# Patient Record
Sex: Female | Born: 1955 | Race: Black or African American | Hispanic: No | Smoking: Never smoker
Health system: Southern US, Community
[De-identification: ages and names within clinical notes are randomized; demographics above are authoritative.]

## PROBLEM LIST (undated history)

## (undated) DIAGNOSIS — L309 Dermatitis, unspecified: Secondary | ICD-10-CM

## (undated) DIAGNOSIS — C50919 Malignant neoplasm of unspecified site of unspecified female breast: Secondary | ICD-10-CM

## (undated) DIAGNOSIS — T4145XA Adverse effect of unspecified anesthetic, initial encounter: Secondary | ICD-10-CM

## (undated) DIAGNOSIS — D649 Anemia, unspecified: Secondary | ICD-10-CM

## (undated) DIAGNOSIS — N200 Calculus of kidney: Secondary | ICD-10-CM

## (undated) DIAGNOSIS — Z9889 Other specified postprocedural states: Secondary | ICD-10-CM

## (undated) DIAGNOSIS — I4891 Unspecified atrial fibrillation: Secondary | ICD-10-CM

## (undated) DIAGNOSIS — K219 Gastro-esophageal reflux disease without esophagitis: Secondary | ICD-10-CM

## (undated) DIAGNOSIS — I499 Cardiac arrhythmia, unspecified: Secondary | ICD-10-CM

## (undated) DIAGNOSIS — E7800 Pure hypercholesterolemia, unspecified: Secondary | ICD-10-CM

## (undated) DIAGNOSIS — R112 Nausea with vomiting, unspecified: Secondary | ICD-10-CM

## (undated) DIAGNOSIS — Z803 Family history of malignant neoplasm of breast: Secondary | ICD-10-CM

## (undated) DIAGNOSIS — I10 Essential (primary) hypertension: Secondary | ICD-10-CM

## (undated) DIAGNOSIS — I480 Paroxysmal atrial fibrillation: Secondary | ICD-10-CM

## (undated) DIAGNOSIS — C50411 Malignant neoplasm of upper-outer quadrant of right female breast: Principal | ICD-10-CM

## (undated) DIAGNOSIS — Z22322 Carrier or suspected carrier of Methicillin resistant Staphylococcus aureus: Secondary | ICD-10-CM

## (undated) DIAGNOSIS — E559 Vitamin D deficiency, unspecified: Secondary | ICD-10-CM

## (undated) DIAGNOSIS — Z8042 Family history of malignant neoplasm of prostate: Secondary | ICD-10-CM

## (undated) HISTORY — DX: Unspecified atrial fibrillation: I48.91

## (undated) HISTORY — DX: Vitamin D deficiency, unspecified: E55.9

## (undated) HISTORY — DX: Anemia, unspecified: D64.9

## (undated) HISTORY — DX: Malignant neoplasm of unspecified site of unspecified female breast: C50.919

## (undated) HISTORY — PX: BREAST SURGERY: SHX581

## (undated) HISTORY — DX: Malignant neoplasm of upper-outer quadrant of right female breast: C50.411

## (undated) HISTORY — DX: Family history of malignant neoplasm of breast: Z80.3

## (undated) HISTORY — DX: Family history of malignant neoplasm of prostate: Z80.42

---

## 1999-07-23 ENCOUNTER — Emergency Department (HOSPITAL_COMMUNITY): Admission: EM | Admit: 1999-07-23 | Discharge: 1999-07-23 | Payer: Self-pay | Admitting: *Deleted

## 2002-05-29 ENCOUNTER — Encounter: Admission: RE | Admit: 2002-05-29 | Discharge: 2002-05-29 | Payer: Self-pay | Admitting: Internal Medicine

## 2002-08-03 ENCOUNTER — Encounter: Admission: RE | Admit: 2002-08-03 | Discharge: 2002-08-03 | Payer: Self-pay | Admitting: Internal Medicine

## 2002-08-23 ENCOUNTER — Encounter: Admission: RE | Admit: 2002-08-23 | Discharge: 2002-08-23 | Payer: Self-pay | Admitting: Internal Medicine

## 2002-09-25 ENCOUNTER — Other Ambulatory Visit: Admission: RE | Admit: 2002-09-25 | Discharge: 2002-09-25 | Payer: Self-pay | Admitting: *Deleted

## 2002-09-25 ENCOUNTER — Encounter (INDEPENDENT_AMBULATORY_CARE_PROVIDER_SITE_OTHER): Payer: Self-pay | Admitting: *Deleted

## 2002-09-25 ENCOUNTER — Encounter: Admission: RE | Admit: 2002-09-25 | Discharge: 2002-09-25 | Payer: Self-pay | Admitting: *Deleted

## 2002-10-29 ENCOUNTER — Encounter: Admission: RE | Admit: 2002-10-29 | Discharge: 2002-10-29 | Payer: Self-pay | Admitting: Internal Medicine

## 2002-11-29 ENCOUNTER — Other Ambulatory Visit: Admission: RE | Admit: 2002-11-29 | Discharge: 2002-11-29 | Payer: Self-pay | Admitting: Internal Medicine

## 2003-10-17 ENCOUNTER — Other Ambulatory Visit: Admission: RE | Admit: 2003-10-17 | Discharge: 2003-10-17 | Payer: Self-pay | Admitting: Internal Medicine

## 2003-11-05 ENCOUNTER — Ambulatory Visit (HOSPITAL_COMMUNITY): Admission: RE | Admit: 2003-11-05 | Discharge: 2003-11-05 | Payer: Self-pay | Admitting: Internal Medicine

## 2003-11-13 ENCOUNTER — Encounter: Admission: RE | Admit: 2003-11-13 | Discharge: 2003-11-13 | Payer: Self-pay | Admitting: Internal Medicine

## 2005-02-01 ENCOUNTER — Encounter: Admission: RE | Admit: 2005-02-01 | Discharge: 2005-02-01 | Payer: Self-pay | Admitting: Internal Medicine

## 2005-02-16 ENCOUNTER — Encounter: Admission: RE | Admit: 2005-02-16 | Discharge: 2005-02-16 | Payer: Self-pay | Admitting: Internal Medicine

## 2005-04-21 ENCOUNTER — Other Ambulatory Visit: Admission: RE | Admit: 2005-04-21 | Discharge: 2005-04-21 | Payer: Self-pay | Admitting: Internal Medicine

## 2005-09-06 ENCOUNTER — Ambulatory Visit (HOSPITAL_COMMUNITY): Admission: RE | Admit: 2005-09-06 | Discharge: 2005-09-06 | Payer: Self-pay | Admitting: Urology

## 2005-10-07 ENCOUNTER — Ambulatory Visit (HOSPITAL_COMMUNITY): Admission: RE | Admit: 2005-10-07 | Discharge: 2005-10-07 | Payer: Self-pay | Admitting: Urology

## 2005-10-29 ENCOUNTER — Inpatient Hospital Stay (HOSPITAL_COMMUNITY): Admission: RE | Admit: 2005-10-29 | Discharge: 2005-11-01 | Payer: Self-pay | Admitting: Urology

## 2006-01-12 ENCOUNTER — Encounter: Admission: RE | Admit: 2006-01-12 | Discharge: 2006-01-12 | Payer: Self-pay | Admitting: Internal Medicine

## 2006-02-03 ENCOUNTER — Ambulatory Visit (HOSPITAL_COMMUNITY): Admission: RE | Admit: 2006-02-03 | Discharge: 2006-02-03 | Payer: Self-pay | Admitting: Family Medicine

## 2007-02-03 ENCOUNTER — Other Ambulatory Visit: Admission: RE | Admit: 2007-02-03 | Discharge: 2007-02-03 | Payer: Self-pay | Admitting: Internal Medicine

## 2007-02-07 ENCOUNTER — Encounter: Admission: RE | Admit: 2007-02-07 | Discharge: 2007-02-07 | Payer: Self-pay | Admitting: Internal Medicine

## 2007-10-19 DIAGNOSIS — Z22322 Carrier or suspected carrier of Methicillin resistant Staphylococcus aureus: Secondary | ICD-10-CM

## 2007-10-19 HISTORY — DX: Carrier or suspected carrier of methicillin resistant Staphylococcus aureus: Z22.322

## 2007-11-16 ENCOUNTER — Emergency Department (HOSPITAL_COMMUNITY): Admission: EM | Admit: 2007-11-16 | Discharge: 2007-11-16 | Payer: Self-pay | Admitting: Emergency Medicine

## 2007-11-17 ENCOUNTER — Ambulatory Visit: Payer: Self-pay | Admitting: Psychiatry

## 2007-11-17 ENCOUNTER — Inpatient Hospital Stay (HOSPITAL_COMMUNITY): Admission: EM | Admit: 2007-11-17 | Discharge: 2007-11-20 | Payer: Self-pay | Admitting: Psychiatry

## 2008-03-06 ENCOUNTER — Encounter: Admission: RE | Admit: 2008-03-06 | Discharge: 2008-03-06 | Payer: Self-pay | Admitting: Internal Medicine

## 2008-07-24 ENCOUNTER — Encounter: Admission: RE | Admit: 2008-07-24 | Discharge: 2008-07-24 | Payer: Self-pay | Admitting: Occupational Medicine

## 2008-07-27 ENCOUNTER — Ambulatory Visit (HOSPITAL_COMMUNITY): Admission: RE | Admit: 2008-07-27 | Discharge: 2008-07-27 | Payer: Self-pay | Admitting: Physician Assistant

## 2009-03-19 ENCOUNTER — Emergency Department (HOSPITAL_COMMUNITY): Admission: EM | Admit: 2009-03-19 | Discharge: 2009-03-19 | Payer: Self-pay | Admitting: Emergency Medicine

## 2009-03-31 ENCOUNTER — Other Ambulatory Visit: Admission: RE | Admit: 2009-03-31 | Discharge: 2009-03-31 | Payer: Self-pay | Admitting: Internal Medicine

## 2009-04-10 ENCOUNTER — Encounter: Admission: RE | Admit: 2009-04-10 | Discharge: 2009-04-10 | Payer: Self-pay | Admitting: Internal Medicine

## 2010-04-13 ENCOUNTER — Encounter: Admission: RE | Admit: 2010-04-13 | Discharge: 2010-04-13 | Payer: Self-pay | Admitting: Internal Medicine

## 2011-03-02 NOTE — H&P (Signed)
[name], HERNAN             ACCOUNT NO.:  [id number]   MEDICAL RECORD NO.:  [id number]          PATIENT TYPE:  IPS   LOCATION:  0401                          FACILITY:  [facility]   PHYSICIAN:  Vic Ripper, P.A.-C.DATE OF BIRTH:  [birth year]   DATE OF ADMISSION:  11/17/2007  DATE OF DISCHARGE:                       PSYCHIATRIC ADMISSION ASSESSMENT   This is a voluntary admission to the services of Dr. Geralyn Flash.   IDENTIFYING INFORMATION:  This is a 55 year old, divorce, African-  American female.  She was brought to the emergency department by her  daughter.  They reported that recently she had had mental status changes  over the past three days, she cannot sleep, she was tearful and  depressed, she admitted thoughts of suicidal ideation due to her  depression.  She had reportedly made a suicidal statement and was crying  and, hence, they felt that she needed to be brought to the emergency  room.  She was preoccupied with events that had happened 20 years ago.  While in the emergency department, it was noted that she had a urinary  tract infection, and she had no drugs, her UDS was negative, her alcohol  level was negative, and her potassium was slightly low at 3.3.   PAST PSYCHIATRIC HISTORY:  There is none.  She did start going to a  therapist in January the last couple of weeks.   SOCIAL HISTORY:  She has been married and divorced once.  She has two  children, a 2 year old daughter, and she is not sure how old her son  is.  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   FAMILY HISTORY:  She told me tonight that her father had had AIDS, he  had depression.   ALCOHOL AND DRUG HISTORY:  She denies.   PRIMARY CARE Melodie Ashworth:  Marisue Brooklyn, DO, and she says she sees Arminda Resides, MD, for her eczema.   MEDICAL PROBLEMS:  1. Eczema.  2. Hypertension.  3. High cholesterol.  4. Status post kidney stone operation.   MEDICATIONS:   Her MAR shows that she has:  1. KCL 10 mEq p.o. two tabs b.i.d.  2. __________ cream 100 mg p.o. daily.  3. Triamcinolone Cream apply b.i.d.  4. Triamterine/hydrochlorothiazide 75/50 p.o. daily.  5. Doxepin 75 mg at h.s.  6. Derma-Smoothe Cream daily.   DRUG ALLERGIES:  There are no known drug allergies.   POSITIVE PHYSICAL FINDINGS:  She was medically cleared in the ED at  [facility].  Most notably, she has hair thinning and she has numerous  changes on her skin consistent with eczema.  VITAL SIGNS ON ADMISSION:  She is 5 foot 2, weighs 204, temperature is  96.6, blood pressure was 155/102 to 156/96, pulse is 95 to 104, and  respirations are 20.   MENTAL STATUS EXAM:  Tonight she was alert, she was oriented x3, she was  unclothed, she had recently been walking around in the hallway; however,  her behavior could be redirected and be purposeful.  Her speech was not  pressured.  Her mood was anxious,  her mood was congruent.  Her thought  processes were clear at times.  Judgment and insight are not good.  Concentration and memory are fair.  Intelligence is average.  She denies  being suicidal or homicidal.  She denies auditory or visual  hallucinations, although she does act psychotically at times.   DIAGNOSES:   AXIS I:  Mood disorder not otherwise specified as indicated by being  disoriented and confused.   AXIS II:  Deferred.   AXIS III:  1. Eczema.  2. Hypertension.  3. High cholesterol.  4. Status post kidney stones.  5. She currently has a urinary tract infection.   AXIS IV:  Medical problems.   AXIS V:  Thirty five.   PLAN:  Admit for safety and stabilization.  Will initiate treatment for  her UTI.  Will evaluate the need for other medications as her mental  status clears and we can get a better history.      Vic Ripper, P.A.-C.     MD/MEDQ  D:  11/17/2007  T:  11/18/2007  Job:  [id number]

## 2011-03-05 NOTE — Discharge Summary (Signed)
[name], [name]             ACCOUNT NO.:  [id number]   MEDICAL RECORD NO.:  [id number]          PATIENT TYPE:   LOCATION:                                 FACILITY:   PHYSICIAN:  Anselm Jungling, MD       DATE OF BIRTH:   DATE OF ADMISSION:  DATE OF DISCHARGE:                               DISCHARGE SUMMARY   IDENTIFYING DATA AND REASON FOR ADMISSION:  This was an inpatient  psychiatric admission for [name], a 55 year old African American female  who was admitted with mental status changes.  Please refer to the  admission note for further details pertaining to the symptoms,  circumstances and history that led to her hospitalization.  She was  given an initial Axis I diagnosis of psychosis NOS.   MEDICAL AND LABORATORY:  The patient came to [place] with medical problems  including hypertension, and chronic eczematous skin disease.  She was  continued on her usual regimen of Maxzide, Tenormin, Zetia, Zocor, and  triamcinolone cream.  She was also continued on her usual Sinequan 75 mg  q.h.s. and K-Dur 20 mEq twice daily.   The patient was found to have a urinary tract infection, and was treated  with Cipro 500 mg b.i.d. for 7 days.  It was ultimately felt that the  urinary tract infection was the basis for her mental status changes.   HOSPITAL COURSE:  The patient was admitted to the adult inpatient  psychiatric service.  She presented as an obese, but well-nourished,  normally-developed woman who initially was quite delirious, disrobing on  the inpatient unit, and completely disoriented.  She was watched closely  by staff.  She did not appear to require any particular psychotropic  medication treatment in order to be stabilized.  Her mental status  cleared spontaneously over the next 3 days.  Her family was involved,  and in contact with case management staff.   On the fourth hospital day the patient was appropriate for discharge.  At this time she was pleasant, cooperative,  fully oriented,  nonpsychotic, and not appearing to need any follow-up psychiatric  treatment.   DISCHARGE DIAGNOSES:  AXIS I: Delirium, NOS, resolved, secondary to  medical condition.  AXIS II: Deferred.  AXIS III: History of hypertension, urinary tract infection, chronic  eczema.  AXIS IV: Stressors severe.  AXIS V: GAF on discharge 75      Anselm Jungling, MD  Electronically Signed     SPB/MEDQ  D:  11/22/2007  T:  11/22/2007  Job:  [id number]

## 2011-03-05 NOTE — Op Note (Signed)
[name], [name]             ACCOUNT NO.:  [id number]   MEDICAL RECORD NO.:  [id number]          PATIENT TYPE:  AMB   LOCATION:  DAY                          FACILITY:  [facility]   PHYSICIAN:  Martina Sinner, MD DATE OF BIRTH:  1956/02/17   DATE OF PROCEDURE:  09/06/2005  DATE OF DISCHARGE:                                 OPERATIVE REPORT   PROCEDURE:  Left retrograde ureterogram.   DESCRIPTION OF PROCEDURE:  Under anesthesia, [name] underwent  cystoscopy with left retrograde ureterogram and insertion of left ureteral  stent.   During left retrograde ureterogram, she had no hydronephrosis or  hydroureter.  She had small delicate calices and a small intrarenal pelvis.  I could see the stone burden on fluoroscopy before injection.  There were  one to two air bubbles.  The stent was placed under fluoroscopic guidance in  the upper pole of the left kidney.           ______________________________  Martina Sinner, MD  Electronically Signed     SAM/MEDQ  D:  09/06/2005  T:  09/06/2005  Job:  [id number]

## 2011-03-05 NOTE — Op Note (Signed)
NAMEPLUMA, DINIZ             ACCOUNT NO.:  [id number]   MEDICAL RECORD NO.:  [id number]          PATIENT TYPE:  OIB   LOCATION:  1008                         [facility]ACILITY[facility]  [facility]   PHYSICIAN:  Mark C. Vernie Ammons, M.D.  DATE OF BIRTH:  18-Apr-1956   DATE OF PROCEDURE:  10/28/2005  DATE OF DISCHARGE:                                 OPERATIVE REPORT   PREOPERATIVE DIAGNOSIS:  Left renal calculus.   POSTOPERATIVE DIAGNOSIS:  Left renal calculus.   PROCEDURE:  Left percutaneous nephroscopy, cystoscopy, left retrograde  pyelogram with interpretation, and double-J stent change.   SURGEON:  Dr. Vernie Ammons.   ASSISTANT:  Dr. Blanch Media.   RADIOLOGIST:  Dr. Deanne Coffer   ANESTHESIA:  General.   DRAINS:  An 10 French council tip catheter in the lower pole calix as a  nephrostomy tube, 18 French Foley catheter in the bladder, and 26 cm 6  French double-J stent in the left ureter up into the upper pole of the left  kidney.   BLOOD LOSS:  Approximately 50 mL.   SPECIMENS:  None.   COMPLICATIONS:  None.   INDICATIONS:  The patient is a 55 year old black female patient of Dr.  McDermott's who presented with multiple left renal calculi.  He treated the  stones with lithotripsy with good results initially to several of the  stones.  Repeat lithotripsy was performed due to persistent stones; however,  further fragmentation was not observed.  I was consulted regarding attempted  removal via the percutaneous approach, and I have discussed this procedure  with the patient as well as its risks and complications, alternatives and  limitations.   DESCRIPTION OF OPERATION:  After informed consent, the patient brought to  the major OR, placed on the table in the prone position after a 16 French  Foley catheter was placed in bladder.  Dr. Fredia Sorrow had previously placed a  percutaneous nephrostomy tube in the lower pole collecting system in  radiology.  The dressing from this was removed and the site as well  as  remainder of flank sterilely prepped and draped.  Dr. Deanne Coffer then gained  further access by placing 2 guidewires down the ureter under direct  fluoroscopic control and then used the TractMaster balloon to dilate the  tract and place a 28-French Amplatz sheath.   The rigid nephroscope was then used to visualize the intrarenal collecting  system; however, it was noted as the scope went in, fat was identified which  was felt to be perinephric fat.  However, further advancement of the scope  allowed identification of what appeared to be normal intrarenal mucosa  followed by further fat seen as the scope was advanced further.  The  guidewires were identified and appeared to be traversing the ureteropelvic  junction and on down to ureter; however, I was unable to visualize the stone  despite spending a great deal of time using fluoroscopy to identify the tip  of the nephroscope and placing it in position to visualize the stone,  indicating the stone was likely in a parallel calix.  Blood clots were  evacuated to make sure there  was no blood obscuring the stone, and no stone  was identified.  The concern was the nephrostomy tract traversed lower pole  calix and may have caused disruption of the floor of that calix in route to  the ureteropelvic junction.  I therefore removed the rigid nephroscope and  passed the flexible cystoscope over the guidewire and was able to identify  the guidewires passing down the ureter through the ureteropelvic junction.  I was unable to identify any stone with this scope either.   I placed a 20 [place] council tip catheter over the guidewire and then  performed an antegrade pyelogram and revealed extravasation as well as  contrast entering the upper portion of the lower pole calyceal system.  She  has a bifid collecting system. and the upper portion of the lower pole  system was slightly dilated and felt to be accessible percutaneously.  Therefore,  Dr. Deanne Coffer  performed a percutaneous access into that upper  portion of the lower pole calix and was able to guide a wire on down the  ureter under fluoroscopy.  This was then dilated with the Amplatz dilators  and a 20 French peel-away sheath was then placed in the calix after which an  18 [place] council tip catheter was passed over the guidewire and through the  peel-away sheath into the calix, and confirmation of its presence was  documented by injecting contrast.  The balloon was partially inflated with 2-  3 mL of dilute contrast, and the peel-away sheath was removed as well as the  guidewire.  The tube was secured to the skin with zero 0 sutures, and the  previous nephrostomy tract was addressed.  I removed the Amplatz sheath as  well as both guidewires.  This was then closed with a 3-0 Vicryl suture in  the deep tissues, and the skin was then reapproximated with Dermabond.   The patient was then moved from the prone to the supine position and then  into the dorsal lithotomy position.  Cystoscopy was performed and stent that  was previously in place was identified, grasped, and pulled out of the  urethra.  A guidewire was passed up this, but access was lost.  A 6 [place]  open-ended catheter was then passed into the left orifice and up the left  ureter under direct fluoroscopic control.   A retrograde pyelogram was then performed using approximately 10 mL of full  strength contrast.  This revealed an intact ureter to the ureteropelvic  junction where just proximal to that, there appeared to be some  extravasation but also filling of the upper pole caliceal system.  I  therefore passed a guidewire through the open-ended stent into the upper  pole calix and then removed the open-ended stent and passed a double-J stent  over the guidewire, with the guidewire being removed and curl being noted in the upper pole calix and in the bladder.  The string was removed from the  stent, and the bladder was  drained, and the cystoscope was removed.  An 89  French Foley catheter was placed in the bladder, and the patient was  awakened and taken to recovery room in stable satisfactory position.  She  had a sterile occlusive dressing applied over her flank wound prior to  repositioning.  She will be observed overnight.   It would appear at this time that the double-J stent now passes from the  ureter through the UPJ and on up through a very tight bifid renal  pelvis up  into an upper pole calix.  The lowest pole calix of the lower pole has been  disrupted to some degree, although I feel it should heal fully.  The  nephrostomy tube is in the upper calix of the lower pole system allowing  drainage of that system as well.  The Foley catheter will be left indwelling  to allow decompression of the system as well.  The patient tolerated the  procedure well.  She was transferred to the recovery room in stable  satisfactory condition.      Mark C. Vernie Ammons, M.D.  Electronically Signed    MCO/MEDQ  D:  10/28/2005  T:  10/29/2005  Job:  [id number]

## 2011-03-05 NOTE — Discharge Summary (Signed)
[name], [name]             ACCOUNT NO.:  [id number]   MEDICAL RECORD NO.:  [id number]          PATIENT TYPE:  INP   LOCATION:  1406                         FACILITY:  [facility]   PHYSICIAN:  Mark C. Vernie Ammons, M.D.  DATE OF BIRTH:  03/18/1956   DATE OF ADMISSION:  10/28/2005  DATE OF DISCHARGE:  11/01/2005                                 DISCHARGE SUMMARY   PRINCIPAL DIAGNOSIS:  Left renal calculi.   OTHER DIAGNOSIS:  Hypokalemia.   Major operation was a left percutaneous nephrostomy.   DISPOSITION:  The patient is discharged home in stable, satisfactory  condition. She is tolerating regular diet with no nausea or vomiting. She is  afebrile. She will be discharged home with a Foley catheter indwelling. Her  follow-up will be in my office in one week. Her discharge medications will  be unchanged from admission with the addition of Vicodin 1-2 q.4h. p.r.n.  #30 and Cipro 500 milligrams b.i.d. #14.   BRIEF HISTORY:  The patient is a 55 year old white female who had undergone  previous lithotripsy of left renal calculi. She had good response to  lithotripsy initially treating some of the stones in her left kidney. In  order to attempt to completely rid her of all stones, second lithotripsy was  performed but was unsuccessful with further fragmenting the stones. I was  asked to perform percutaneous nephrolithotomy in an attempt to clear her of  her stone burden. She was therefore admitted with the plan of performing  percutaneous nephrostolithotomy.   She had percutaneous access obtained the morning of her planned procedure.  It was found that she had a bifid collecting system with a very small renal  pelvis. Despite that, the tube appeared to be in good position. At the time  of surgery, however, it was found that the tube appeared to have traversed  the submucosa but did not afford good access to the lower pole and therefore  the stones could not be visualized. A second  percutaneous access point was  obtained in the upper portion of the lower pole system and a nephrostomy  tube was left in place. A stent was left through this area and curled in the  upper pole of the kidney and a Foley catheter was left indwelling. She was  observed overnight and was noted to have a stable hemoglobin. She had low  potassium treated with runs of potassium and these were corrected. Her  creatinine remained stable. She was seen the day after her procedure. She  appeared to be having some abdominal pain that was relieved with pain  medications. Unfortunately, she was not tolerating regular diet and  therefore she was observed since she was not ambulating either.   Her nephrostomy tube quit draining and a nephrostogram was obtained. This  revealed that the tube was out of the kidney and therefore it was removed.  The stent remained indwelling as did the Foley catheter.   A CT scan was done the day following her procedure and she was found to have  no significant perinephric fluid collection or hematoma. The stent  nephrostomy tube  were noted to be in good position at that time.   Through her hospital course, she was slow to ambulate. She had some nausea  and that eventually resolved. At this time, her nausea has resolved. She is  slowly beginning to ambulate and tolerating a regular diet without vomiting.  She was felt ready for discharge as long as continues to tolerate a regular  diet and ambulate. She will follow-up with me in my office in one week.  During that time, her Foley catheter will remain indwelling. I then will  discuss with her further treatment of her stones.   Because of her continued hypokalemia during this hospitalization, since she  takes what is suppose to be a potassium sparing diuretics of triamterene and  hydrochlorothiazide, I am going to have her stop that for now.      Mark C. Vernie Ammons, M.D.  Electronically Signed     MCO/MEDQ  D:   11/01/2005  T:  11/02/2005  Job:  [id number]

## 2011-03-05 NOTE — Op Note (Signed)
NAMEODA, PLACKE             ACCOUNT NO.:  [id number]   MEDICAL RECORD NO.:  [id number]          PATIENT TYPE:  AMB   LOCATION:  DAY                          [facility]AC[facility]   PHYSICIAN:  Martina Sinner, MD DATE OF BIRTH:  October 02, 1956   DATE OF PROCEDURE:  09/06/2005  DATE OF DISCHARGE:                                 OPERATIVE REPORT   PREOPERATIVE DIAGNOSIS:  Bilateral renal calculi.   SURGERY:  Cystoscopy, left retrograde ureterogram, insertion of left  ureteral stent.   [name] has bilateral renal stones, but the majority of the stone  bulk was in the left kidney, and she is for lithotripsy. I wanted to put in  a stent because of stone burden.   The patient was prepped and draped in usual fashion. Clinically, she did not  have a urinary tract infection prior to the procedure. She was given  ciprofloxacin 400 mg IV prior to the procedure.   A 21-French scope was used for the initial examination. The bladder mucosa  and trigone were normal. There was no_____________ bladder carcinoma. The  ureteral orifices were quite small. For this reason, I cannulated the left  ureteral orifice initially with a glide wire. We used the sensor wire. I  then passed a 6-French open-ended catheter into the lower pelvic ureter. I  then did a left retrograde ureterogram. She had no hydronephrosis. I could  see the stones on fluoroscopy prior to injection. There was one or two air  bubbles seen on the retrograde. She had a very small collecting system which  theoretically could affect passage of stone burden. Certainly, a  percutaneous procedure may be difficult based upon her anatomy.   A 6-French x 26-cm left ureteral stent was easily passed under fluoroscopic  guidance into the upper pole of the left kidney. It was curled up in the  upper pole as well as in the bladder. The bladder was emptied at the end of  the case. There was no complications. The patient was taken to the  recovery  room.           ______________________________  Martina Sinner, MD  Electronically Signed     SAM/MEDQ  D:  09/06/2005  T:  09/06/2005  Job:  [id number]

## 2011-04-14 ENCOUNTER — Other Ambulatory Visit (HOSPITAL_COMMUNITY)
Admission: RE | Admit: 2011-04-14 | Payer: BC Managed Care – PPO | Source: Ambulatory Visit | Admitting: Internal Medicine

## 2011-04-14 ENCOUNTER — Other Ambulatory Visit: Payer: Self-pay | Admitting: Internal Medicine

## 2011-04-14 DIAGNOSIS — Z01419 Encounter for gynecological examination (general) (routine) without abnormal findings: Secondary | ICD-10-CM | POA: Insufficient documentation

## 2011-04-14 DIAGNOSIS — Z1231 Encounter for screening mammogram for malignant neoplasm of breast: Secondary | ICD-10-CM

## 2011-04-22 ENCOUNTER — Ambulatory Visit
Admission: RE | Admit: 2011-04-22 | Discharge: 2011-04-22 | Disposition: A | Discharge status: Home / Self Care | Payer: BC Managed Care – PPO | Source: Ambulatory Visit | Attending: Internal Medicine | Admitting: Internal Medicine

## 2011-04-22 DIAGNOSIS — Z1231 Encounter for screening mammogram for malignant neoplasm of breast: Secondary | ICD-10-CM

## 2011-07-08 LAB — URINALYSIS, ROUTINE W REFLEX MICROSCOPIC
Bilirubin Urine: NEGATIVE
Hgb urine dipstick: NEGATIVE
Nitrite: NEGATIVE
Specific Gravity, Urine: 1.023
pH: 6

## 2011-07-08 LAB — URINE CULTURE: Colony Count: 15000

## 2011-07-08 LAB — DIFFERENTIAL
Basophils Absolute: 0
Basophils Relative: 1
Lymphocytes Relative: 21
Neutro Abs: 4.4
Neutrophils Relative %: 68

## 2011-07-08 LAB — CBC
Hemoglobin: 15
MCHC: 33.7
Platelets: 384
RDW: 16.1 — ABNORMAL HIGH

## 2011-07-08 LAB — URINE MICROSCOPIC-ADD ON

## 2011-07-08 LAB — BASIC METABOLIC PANEL
BUN: 7
Calcium: 10.8 — ABNORMAL HIGH
Creatinine, Ser: 0.77
GFR calc non Af Amer: >60
Potassium: 3.3 — ABNORMAL LOW

## 2011-07-08 LAB — RAPID URINE DRUG SCREEN, HOSP PERFORMED: Benzodiazepines: NOT DETECTED

## 2011-10-19 HISTORY — PX: KIDNEY STONE SURGERY: SHX686

## 2011-10-27 ENCOUNTER — Emergency Department (HOSPITAL_COMMUNITY)
Admission: EM | Admit: 2011-10-27 | Discharge: 2011-10-27 | Disposition: A | Discharge status: Home / Self Care | Payer: BC Managed Care – PPO | Attending: Emergency Medicine | Admitting: Emergency Medicine

## 2011-10-27 ENCOUNTER — Encounter (HOSPITAL_COMMUNITY): Payer: Self-pay | Admitting: *Deleted

## 2011-10-27 ENCOUNTER — Emergency Department (HOSPITAL_COMMUNITY): Payer: BC Managed Care – PPO

## 2011-10-27 DIAGNOSIS — I4891 Unspecified atrial fibrillation: Secondary | ICD-10-CM | POA: Insufficient documentation

## 2011-10-27 DIAGNOSIS — R079 Chest pain, unspecified: Secondary | ICD-10-CM | POA: Insufficient documentation

## 2011-10-27 DIAGNOSIS — I10 Essential (primary) hypertension: Secondary | ICD-10-CM | POA: Insufficient documentation

## 2011-10-27 DIAGNOSIS — E7800 Pure hypercholesterolemia, unspecified: Secondary | ICD-10-CM | POA: Insufficient documentation

## 2011-10-27 DIAGNOSIS — IMO0002 Reserved for concepts with insufficient information to code with codable children: Secondary | ICD-10-CM

## 2011-10-27 HISTORY — DX: Calculus of kidney: N20.0

## 2011-10-27 HISTORY — DX: Pure hypercholesterolemia, unspecified: E78.00

## 2011-10-27 HISTORY — DX: Carrier or suspected carrier of methicillin resistant Staphylococcus aureus: Z22.322

## 2011-10-27 HISTORY — DX: Essential (primary) hypertension: I10

## 2011-10-27 HISTORY — DX: Dermatitis, unspecified: L30.9

## 2011-10-27 LAB — POCT I-STAT, CHEM 8
BUN: 4 mg/dL — ABNORMAL LOW (ref 6–23)
Chloride: 102 mEq/L (ref 96–112)
Sodium: 140 mEq/L (ref 135–145)
TCO2: 25 mmol/L (ref 0–100)

## 2011-10-27 LAB — DIFFERENTIAL
Basophils Absolute: 0 10*3/uL (ref 0.0–0.1)
Basophils Relative: 0 % (ref 0–1)
Eosinophils Absolute: 0.1 10*3/uL (ref 0.0–0.7)
Eosinophils Relative: 3 % (ref 0–5)
Lymphocytes Relative: 34 % (ref 12–46)
Lymphs Abs: 1.2 10*3/uL (ref 0.7–4.0)
Monocytes Absolute: 0.5 10*3/uL (ref 0.1–1.0)
Monocytes Relative: 15 % — ABNORMAL HIGH (ref 3–12)
Neutro Abs: 1.6 10*3/uL — ABNORMAL LOW (ref 1.7–7.7)
Neutrophils Relative %: 47 % (ref 43–77)

## 2011-10-27 LAB — APTT: aPTT: 29 seconds (ref 24–37)

## 2011-10-27 LAB — CBC
HCT: 42.9 % (ref 36.0–46.0)
Hemoglobin: 13.5 g/dL (ref 12.0–15.0)
MCH: 27.7 pg (ref 26.0–34.0)
MCHC: 31.5 g/dL (ref 30.0–36.0)
MCV: 87.9 fL (ref 78.0–100.0)
Platelets: 222 10*3/uL (ref 150–400)
RBC: 4.88 MIL/uL (ref 3.87–5.11)
RDW: 14.9 % (ref 11.5–15.5)
WBC: 3.4 10*3/uL — ABNORMAL LOW (ref 4.0–10.5)

## 2011-10-27 LAB — PROTIME-INR
INR: 1.03 (ref 0.00–1.49)
Prothrombin Time: 13.7 seconds (ref 11.6–15.2)

## 2011-10-27 LAB — TROPONIN I: Troponin I: <0.3 ng/mL (ref <0.30)

## 2011-10-27 NOTE — ED Provider Notes (Signed)
Medical screening examination/treatment/procedure(s) were performed by non-physician practitioner and as supervising physician I was immediately available for consultation/collaboration.  Gomer France P Copeland Lapier, MD 10/27/11 1537 

## 2011-10-27 NOTE — ED Provider Notes (Signed)
History     CSN: [id number]  Arrival date & time 10/27/11  1108   First MD Initiated Contact with Patient 10/27/11 1143      Chief Complaint  Patient presents with  . Chest Pain    (Consider location/radiation/quality/duration/timing/severity/associated sxs/prior treatment) Patient is a 56 y.o. female presenting with chest pain. The history is provided by the patient.  Chest Pain Episode onset: She reports chest pain last night for one episode lasting 5 minutes. No associated shortness of breath, nausea, cough or recent fever. Progression since onset: She went to see her doctor today and was found to be in rapid atrial fibrillation and sent here for further evaluation. Associated with: She denies recurrent chest pain, vomiting. She states she did feel like her heart was racing this morning but denies current symptoms. Primary symptoms include palpitations. Pertinent negatives for primary symptoms include no fever, no shortness of breath and no cough.  The palpitations did not occur with shortness of breath.  Her past medical history is significant for hypertension.     Past Medical History  Diagnosis Date  . Kidney stones   . MRSA (methicillin resistant staph aureus) culture positive   . Eczema   . Hypertension   . Hypercholesteremia     History reviewed. No pertinent past surgical history.  No family history on file.  History  Substance Use Topics  . Smoking status: Not on file  . Smokeless tobacco: Not on file  . Alcohol Use: No    OB History    Grav Para Term Preterm Abortions TAB SAB Ect Mult Living                  Review of Systems  Constitutional: Negative for fever and chills.  HENT: Negative.   Respiratory: Negative.  Negative for cough and shortness of breath.   Cardiovascular: Positive for chest pain and palpitations.  Gastrointestinal: Negative.        She reports symptoms of N, V, D in the last week that have improved and/or resolved over the last 2  days.   Genitourinary: Negative.   Musculoskeletal: Negative.   Skin: Negative.   Neurological: Negative.     Allergies  Penicillins  Home Medications   Current Outpatient Rx  Name Route Sig Dispense Refill  . ASCORBIC ACID 1000 MG PO TABS Oral Take 1,000 mg by mouth daily.    . ATENOLOL 100 MG PO TABS Oral Take 50 mg by mouth daily. PT TAKES ( 1/2 OF 50 MG )    . VITAMIN D PO Oral Take 1 tablet by mouth 3 (three) times daily.    Marland Kitchen DOXEPIN HCL 75 MG PO CAPS Oral Take 75 mg by mouth at bedtime.    . IRON COMPLEX PO Oral Take 1 tablet by mouth daily.    . OLOPATADINE HCL 0.2 % OP SOLN Ophthalmic Apply 1 drop to eye daily.    Marland Kitchen POTASSIUM CHLORIDE CRYS ER 20 MEQ PO TBCR Oral Take 20 mEq by mouth 2 (two) times daily.    . TRIAMTERENE-HCTZ 37.5-25 MG PO TABS Oral Take 1 tablet by mouth daily.      BP 136/117  Pulse 130  Temp(Src) 98.5 F (36.9 C) (Oral)  Resp 19  Wt 212 lb (96.163 kg)  SpO2 100%  Physical Exam  Constitutional: She appears well-developed and well-nourished.  HENT:  Head: Normocephalic.  Neck: Normal range of motion. Neck supple.  Cardiovascular: Normal rate and regular rhythm.  RRR, rate 97.   Pulmonary/Chest: Effort normal and breath sounds normal.  Abdominal: Soft. Bowel sounds are normal. There is no tenderness. There is no rebound and no guarding.  Musculoskeletal: Normal range of motion.  Neurological: She is alert. No cranial nerve deficit.  Skin: Skin is warm and dry. No rash noted.  Psychiatric: She has a normal mood and affect.    ED Course  Procedures (including critical care time)   Labs Reviewed  I-STAT, CHEM 8  TROPONIN I  CBC  DIFFERENTIAL  PROTIME-INR  APTT   No results found.   No diagnosis found.    MDM  EKG sent from Dr. Kathryne Sharper office shows a-fib RVR rate of 145. Repeat EKG here shows a NSR with a rate of 112 with resolution of arrythmia.   Re-eval: (2:40) The patient remains asymptomatic. Normal sinus at 91 bpm  on the monitor. Will call to discuss with Dr. Oneta Rack but anticipate discharge home.         Rodena Medin, PA-C 10/27/11 1452  Rodena Medin, PA-C 10/27/11 1456

## 2011-10-27 NOTE — ED Notes (Signed)
Pt sent from primary care for new dx of A-fib.

## 2012-04-27 ENCOUNTER — Other Ambulatory Visit: Payer: Self-pay | Admitting: Internal Medicine

## 2012-04-27 DIAGNOSIS — M8580 Other specified disorders of bone density and structure, unspecified site: Secondary | ICD-10-CM

## 2012-04-27 DIAGNOSIS — Z1231 Encounter for screening mammogram for malignant neoplasm of breast: Secondary | ICD-10-CM

## 2012-05-17 ENCOUNTER — Ambulatory Visit: Payer: BC Managed Care – PPO

## 2012-05-17 ENCOUNTER — Other Ambulatory Visit: Payer: BC Managed Care – PPO

## 2012-05-29 ENCOUNTER — Ambulatory Visit
Admission: RE | Admit: 2012-05-29 | Discharge: 2012-05-29 | Disposition: A | Discharge status: Home / Self Care | Payer: BC Managed Care – PPO | Source: Ambulatory Visit | Attending: Internal Medicine | Admitting: Internal Medicine

## 2012-05-29 DIAGNOSIS — M8580 Other specified disorders of bone density and structure, unspecified site: Secondary | ICD-10-CM

## 2012-05-29 DIAGNOSIS — Z1231 Encounter for screening mammogram for malignant neoplasm of breast: Secondary | ICD-10-CM

## 2013-08-30 ENCOUNTER — Other Ambulatory Visit: Payer: Self-pay | Admitting: Emergency Medicine

## 2013-08-30 ENCOUNTER — Other Ambulatory Visit: Payer: Self-pay | Admitting: Internal Medicine

## 2013-08-30 MED ORDER — EZETIMIBE-SIMVASTATIN 10-40 MG PO TABS: 1.0000 | ORAL_TABLET | Freq: Every day | ORAL | Status: DC

## 2013-08-30 MED ORDER — DILTIAZEM HCL ER BEADS 120 MG PO CP24: 120.0000 mg | ORAL_CAPSULE | Freq: Every day | ORAL | Status: DC

## 2013-09-26 ENCOUNTER — Other Ambulatory Visit: Payer: Self-pay | Admitting: Emergency Medicine

## 2013-09-26 MED ORDER — ATENOLOL 100 MG PO TABS: ORAL_TABLET | ORAL | Status: DC

## 2013-09-26 MED ORDER — DOXEPIN HCL 75 MG PO CAPS: 75.0000 mg | ORAL_CAPSULE | Freq: Every day | ORAL | Status: DC

## 2013-10-15 ENCOUNTER — Other Ambulatory Visit: Payer: Self-pay | Admitting: Physician Assistant

## 2013-10-15 MED ORDER — POTASSIUM CHLORIDE CRYS ER 20 MEQ PO TBCR: 20.0000 meq | EXTENDED_RELEASE_TABLET | Freq: Two times a day (BID) | ORAL | Status: DC

## 2013-10-31 ENCOUNTER — Other Ambulatory Visit: Payer: Self-pay | Admitting: Internal Medicine

## 2013-11-12 DIAGNOSIS — N200 Calculus of kidney: Secondary | ICD-10-CM | POA: Insufficient documentation

## 2013-11-12 DIAGNOSIS — I10 Essential (primary) hypertension: Secondary | ICD-10-CM | POA: Insufficient documentation

## 2013-11-12 DIAGNOSIS — E559 Vitamin D deficiency, unspecified: Secondary | ICD-10-CM | POA: Insufficient documentation

## 2013-11-12 DIAGNOSIS — E782 Mixed hyperlipidemia: Secondary | ICD-10-CM | POA: Insufficient documentation

## 2013-11-12 DIAGNOSIS — I4891 Unspecified atrial fibrillation: Secondary | ICD-10-CM | POA: Insufficient documentation

## 2013-11-12 DIAGNOSIS — D649 Anemia, unspecified: Secondary | ICD-10-CM | POA: Insufficient documentation

## 2013-11-13 ENCOUNTER — Encounter: Payer: Self-pay | Admitting: Internal Medicine

## 2013-11-13 ENCOUNTER — Other Ambulatory Visit: Payer: Self-pay | Admitting: Internal Medicine

## 2013-11-13 ENCOUNTER — Ambulatory Visit: Payer: BC Managed Care – PPO | Admitting: Internal Medicine

## 2013-11-13 VITALS — BP 114/62 | HR 64 | Temp 97.5°F | Resp 18 | Wt 204.6 lb

## 2013-11-13 DIAGNOSIS — R7309 Other abnormal glucose: Secondary | ICD-10-CM

## 2013-11-13 DIAGNOSIS — Z79899 Other long term (current) drug therapy: Secondary | ICD-10-CM

## 2013-11-13 DIAGNOSIS — I10 Essential (primary) hypertension: Secondary | ICD-10-CM

## 2013-11-13 DIAGNOSIS — E782 Mixed hyperlipidemia: Secondary | ICD-10-CM

## 2013-11-13 DIAGNOSIS — E559 Vitamin D deficiency, unspecified: Secondary | ICD-10-CM

## 2013-11-13 DIAGNOSIS — R7303 Prediabetes: Secondary | ICD-10-CM | POA: Insufficient documentation

## 2013-11-13 LAB — HEPATIC FUNCTION PANEL
ALBUMIN: 4.4 g/dL (ref 3.5–5.2)
ALT: 15 U/L (ref 0–35)
AST: 16 U/L (ref 0–37)
Alkaline Phosphatase: 73 U/L (ref 39–117)
Bilirubin, Direct: 0.1 mg/dL (ref 0.0–0.3)
Indirect Bilirubin: 0.2 mg/dL (ref 0.0–0.9)
TOTAL PROTEIN: 6.9 g/dL (ref 6.0–8.3)
Total Bilirubin: 0.3 mg/dL (ref 0.3–1.2)

## 2013-11-13 LAB — LIPID PANEL
CHOLESTEROL: 150 mg/dL (ref 0–200)
HDL: 72 mg/dL (ref >39)
LDL Cholesterol: 63 mg/dL (ref 0–99)
Total CHOL/HDL Ratio: 2.1 Ratio
Triglycerides: 76 mg/dL (ref <150)
VLDL: 15 mg/dL (ref 0–40)

## 2013-11-13 LAB — CBC WITH DIFFERENTIAL/PLATELET
BASOS ABS: 0 10*3/uL (ref 0.0–0.1)
BASOS PCT: 1 % (ref 0–1)
EOS PCT: 3 % (ref 0–5)
Eosinophils Absolute: 0.2 10*3/uL (ref 0.0–0.7)
HEMATOCRIT: 42.3 % (ref 36.0–46.0)
Hemoglobin: 14.3 g/dL (ref 12.0–15.0)
Lymphocytes Relative: 26 % (ref 12–46)
Lymphs Abs: 1.5 10*3/uL (ref 0.7–4.0)
MCH: 30.9 pg (ref 26.0–34.0)
MCHC: 33.8 g/dL (ref 30.0–36.0)
MCV: 91.4 fL (ref 78.0–100.0)
MONO ABS: 0.7 10*3/uL (ref 0.1–1.0)
Monocytes Relative: 11 % (ref 3–12)
Neutro Abs: 3.5 10*3/uL (ref 1.7–7.7)
Neutrophils Relative %: 59 % (ref 43–77)
PLATELETS: 325 10*3/uL (ref 150–400)
RBC: 4.63 MIL/uL (ref 3.87–5.11)
RDW: 15.5 % (ref 11.5–15.5)
WBC: 5.9 10*3/uL (ref 4.0–10.5)

## 2013-11-13 LAB — HEMOGLOBIN A1C
HEMOGLOBIN A1C: 5.5 % (ref <5.7)
MEAN PLASMA GLUCOSE: 111 mg/dL (ref <117)

## 2013-11-13 LAB — BASIC METABOLIC PANEL WITH GFR
BUN: 17 mg/dL (ref 6–23)
CALCIUM: 9.6 mg/dL (ref 8.4–10.5)
CHLORIDE: 101 meq/L (ref 96–112)
CO2: 28 mEq/L (ref 19–32)
CREATININE: 0.75 mg/dL (ref 0.50–1.10)
GFR, EST NON AFRICAN AMERICAN: 88 mL/min
GFR, Est African American: >89 mL/min
Glucose, Bld: 72 mg/dL (ref 70–99)
Potassium: 4.2 mEq/L (ref 3.5–5.3)
Sodium: 137 mEq/L (ref 135–145)

## 2013-11-13 LAB — MAGNESIUM: MAGNESIUM: 2 mg/dL (ref 1.5–2.5)

## 2013-11-13 NOTE — Progress Notes (Signed)
Patient ID: [name], female   DOB: 06/14/1956, 58 y.o.   MRN: [id number]   This very nice 58 y.o.  BF presents for 3 month follow up with Hypertension, Hyperlipidemia, Pre-Diabetes and Vitamin D Deficiency.    HTN predates since   . BP has been controlled at home. Today's   . Patient denies any cardiac type chest pain, palpitations, dyspnea/orthopnea/PND, dizziness, claudication, or dependent edema.   Hyperlipidemia is controlled with diet & meds. Last Cholesterol was  , Triglycerides were    , HDL    and LDL    . Patient denies myalgias or other med SE's.    Also, the patient has history of PreDiabetes since     with last A1c of    . Patient denies any symptoms of reactive hypoglycemia, diabetic polys, paresthesias or visual blurring.   Further, Patient has history of Vitamin D Deficiency with last vitamin D of   . Patient supplements vitamin D without any suspected side-effects.    Medication List       This list is accurate as of: 11/13/13 12:53 AM.  Always use your most recent med list.               ascorbic acid 1000 MG tablet  Commonly known as:  VITAMIN C  Take 1,000 mg by mouth daily.     atenolol 100 MG tablet  Commonly known as:  TENORMIN  PT TAKES ( 1/2 OF 100 MG )     diltiazem 120 MG 24 hr capsule  Commonly known as:  TIAZAC  Take 1 capsule (120 mg total) by mouth daily.     doxepin 75 MG capsule  Commonly known as:  SINEQUAN  Take 1 capsule (75 mg total) by mouth at bedtime.     ezetimibe-simvastatin 10-40 MG per tablet  Commonly known as:  VYTORIN  Take 1 tablet by mouth at bedtime.     IRON COMPLEX PO  Take 1 tablet by mouth daily.     PATADAY 0.2 % Soln  Generic drug:  Olopatadine HCl  Apply 1 drop to eye daily.     potassium chloride SA 20 MEQ tablet  Commonly known as:  K-DUR,KLOR-CON  Take 1 tablet (20 mEq total) by mouth 2 (two) times daily.     triamterene-hydrochlorothiazide 37.5-25 MG per tablet  Commonly known as:  MAXZIDE-25   Take 1 tablet by mouth daily.     VITAMIN D PO  Take 1 tablet by mouth 3 (three) times daily.         Allergies  Allergen Reactions  . Penicillins Other (See Comments)    DOESN'T REMEMBER    PMHx:   Past Medical History  Diagnosis Date  . MRSA (methicillin resistant staph aureus) culture positive   . Eczema   . Hypercholesteremia   . Hypertension   . Kidney stones   . Anemia   . Vitamin D deficiency   . A-fib     FHx:    Reviewed / unchanged  SHx:    Reviewed / unchanged  Systems Review: Constitutional: Denies fever, chills, wt changes, headaches, insomnia, fatigue, night sweats, change in appetite. Eyes: Denies redness, blurred vision, diplopia, discharge, itchy, watery eyes.  ENT: Denies discharge, congestion, post nasal drip, epistaxis, sore throat, earache, hearing loss, dental pain, tinnitus, vertigo, sinus pain, snoring.  CV: Denies chest pain, palpitations, irregular heartbeat, syncope, dyspnea, diaphoresis, orthopnea, PND, claudication, edema. Respiratory: denies cough, dyspnea, DOE, pleurisy, hoarseness, laryngitis, wheezing.  Gastrointestinal: Denies dysphagia, odynophagia, heartburn, reflux, water brash, abdominal pain or cramps, nausea, vomiting, bloating, diarrhea, constipation, hematemesis, melena, hematochezia,  or hemorrhoids. Genitourinary: Denies dysuria, frequency, urgency, nocturia, hesitancy, discharge, hematuria, flank pain. Musculoskeletal: Denies arthralgias, myalgias, stiffness, jt. swelling, pain, limp, strain/sprain.  Skin: Denies pruritus, rash, hives, warts, acne, eczema, change in skin lesion(s). Neuro: No weakness, tremor, incoordination, spasms, paresthesia, or pain. Psychiatric: Denies confusion, memory loss, or sensory loss. Endo: Denies change in weight, skin, hair change.  Heme/Lymph: No excessive bleeding, bruising, orenlarged lymph nodes.  There were no vitals filed for this visit.  There is no height on file to calculate  BMI.  On Exam: Appears well nourished - in no distress. Eyes: PERRLA, EOMs, conjunctiva no swelling or erythema. Sinuses: No frontal/maxillary tenderness ENT/Mouth: EAC's clear, TM's nl w/o erythema, bulging. Nares clear w/o erythema, swelling, exudates. Oropharynx clear without erythema or exudates. Oral hygiene is good. Tongue normal, non obstructing. Hearing intact.  Neck: Supple. Thyroid nl. Car 2+/2+ without bruits, nodes or JVD. Chest: Respirations nl with BS clear & equal w/o rales, rhonchi, wheezing or stridor.  Cor: Heart sounds normal w/ regular rate and rhythm without sig. murmurs, gallops, clicks, or rubs. Peripheral pulses normal and equal  without edema.  Abdomen: Soft & bowel sounds normal. Non-tender w/o guarding, rebound, hernias, masses, or organomegaly.  Lymphatics: Unremarkable.  Musculoskeletal: Full ROM all peripheral extremities, joint stability, 5/5 strength, and normal gait.  Skin: Warm, dry without exposed rashes, lesions, ecchymosis apparent.  Neuro: Cranial nerves intact, reflexes equal bilaterally. Sensory-motor testing grossly intact. Tendon reflexes grossly intact.  Pysch: Alert & oriented x 3. Insight and judgement nl & appropriate. No ideations.  Assessment and Plan:  1. Hypertension - Continue monitor blood pressure at home. Continue diet/meds same.  2. Hyperlipidemia - Continue diet/meds, exercise,& lifestyle modifications. Continue monitor periodic cholesterol/liver & renal functions   3. Pre-diabetes/Insulin Resistance - Continue diet, exercise, lifestyle modifications. Monitor appropriate labs.  4. Vitamin D Deficiency - Continue supplementation.  Recommended regular exercise, BP monitoring, weight control, and discussed med and SE's. Recommended labs to assess and monitor clinical status. Further disposition pending results of labs.

## 2013-11-13 NOTE — Patient Instructions (Signed)

## 2013-11-14 LAB — TSH: TSH: 1.664 u[IU]/mL (ref 0.350–4.500)

## 2013-11-14 LAB — INSULIN, FASTING: INSULIN FASTING, SERUM: 16 u[IU]/mL (ref 3–28)

## 2013-11-14 LAB — VITAMIN D 25 HYDROXY (VIT D DEFICIENCY, FRACTURES): VIT D 25 HYDROXY: 55 ng/mL (ref 30–89)

## 2013-11-16 NOTE — Progress Notes (Signed)
This very nice 58 y.o. WBF presents for 3 month follow up with Hypertension, Hyperlipidemia, Pre-Diabetes and Vitamin D Deficiency.    She has lonstanding Hypertension an reports BP has been controlled at home. Today's BP: 128/72 mmHg. Patient denies any cardiac type chest pain, palpitations, dyspnea/orthopnea/PND, dizziness, claudication, or dependent edema.   Hyperlipidemia is controlled with diet & meds. Last Cholesterol was  150, Triglycerides were 76, HDL 72 and LDL 63  On 11/13/2013 - all at goal. Patient denies myalgias or other med SE's.    Also, the patient has Morbid Obesity (BMI 38)  And is monitored for PreDiabetes with last A1c of 5.5Z% on 13 Nov 2012. Patient denies any symptoms of reactive hypoglycemia, diabetic polys, paresthesias or visual blurring.   Further, she has history of Vitamin D Deficiency with last vitamin D of 55. Patient supplements vitamin D without any suspected side-effects.    Medication List       ascorbic acid 1000 MG tablet  Commonly known as:  VITAMIN C  Take 1,000 mg by mouth daily.     atenolol 100 MG tablet  Commonly known as:  TENORMIN  PT TAKES ( 1/2 OF 100 MG )     atorvastatin 80 MG tablet  Commonly known as:  LIPITOR  Take 1 tablet (80 mg total) by mouth daily. For cholesterol     diltiazem 120 MG 24 hr capsule  Commonly known as:  TIAZAC  Take 1 capsule (120 mg total) by mouth daily.     doxepin 75 MG capsule  Commonly known as:  SINEQUAN  Take 1 capsule (75 mg total) by mouth at bedtime.     IRON COMPLEX PO  Take 1 tablet by mouth daily.     PATADAY 0.2 % Soln  Generic drug:  Olopatadine HCl  Apply 1 drop to eye daily.     potassium chloride SA 20 MEQ tablet  Commonly known as:  K-DUR,KLOR-CON  Take 1 tablet (20 mEq total) by mouth 2 (two) times daily.     triamterene-hydrochlorothiazide 37.5-25 MG per tablet  Commonly known as:  MAXZIDE-25  Take 1 tablet by mouth daily.     VITAMIN D PO  Take 1 tablet by mouth 3  (three) times daily.         Allergies  Allergen Reactions  . Penicillins Other (See Comments)    DOESN'T REMEMBER    PMHx:   Past Medical History  Diagnosis Date  . MRSA (methicillin resistant staph aureus) culture positive   . Eczema   . Hypercholesteremia   . Hypertension   . Kidney stones   . Anemia   . Vitamin D deficiency   . A-fib     FHx:    Reviewed / unchanged  SHx:    Reviewed / unchanged  Systems Review: Constitutional: Denies fever, chills, wt changes, headaches, insomnia, fatigue, night sweats, change in appetite. Eyes: Denies redness, blurred vision, diplopia, discharge, itchy, watery eyes.  ENT: Denies discharge, congestion, post nasal drip, epistaxis, sore throat, earache, hearing loss, dental pain, tinnitus, vertigo, sinus pain, snoring.  CV: Denies chest pain, palpitations, irregular heartbeat, syncope, dyspnea, diaphoresis, orthopnea, PND, claudication, edema. Respiratory: denies cough, dyspnea, DOE, pleurisy, hoarseness, laryngitis, wheezing.  Gastrointestinal: Denies dysphagia, odynophagia, heartburn, reflux, water brash, abdominal pain or cramps, nausea, vomiting, bloating, diarrhea, constipation, hematemesis, melena, hematochezia,  or hemorrhoids. Genitourinary: Denies dysuria, frequency, urgency, nocturia, hesitancy, discharge, hematuria, flank pain. Musculoskeletal: Denies arthralgias, myalgias, stiffness, jt. swelling, pain, limp, strain/sprain.  Skin:  Denies pruritus, rash, hives, warts, acne, eczema, change in skin lesion(s). Neuro: No weakness, tremor, incoordination, spasms, paresthesia, or pain. Psychiatric: Denies confusion, memory loss, or sensory loss. Endo: Denies change in weight, skin, hair change.  Heme/Lymph: No excessive bleeding, bruising, orenlarged lymph nodes.  BP: 128/72  Pulse: 64  Temp: 97.8 F (36.6 C)  Resp: 16   On Exam:  Appears well nourished - in no distress. Eyes: PERRLA, EOMs, conjunctiva no swelling or  erythema. Sinuses: No frontal/maxillary tenderness ENT/Mouth: EAC's clear, TM's nl w/o erythema, bulging. Nares clear w/o erythema, swelling, exudates. Oropharynx clear without erythema or exudates. Oral hygiene is good. Tongue normal, non obstructing. Hearing intact.  Neck: Supple. Thyroid nl. Car 2+/2+ without bruits, nodes or JVD. Chest: Respirations nl with BS clear & equal w/o rales, rhonchi, wheezing or stridor.  Cor: Heart sounds normal w/ regular rate and rhythm without sig. murmurs, gallops, clicks, or rubs. Peripheral pulses normal and equal  without edema.  Abdomen: Soft & bowel sounds normal. Non-tender w/o guarding, rebound, hernias, masses, or organomegaly.  Lymphatics: Unremarkable.  Musculoskeletal: Full ROM all peripheral extremities, joint stability, 5/5 strength, and normal gait.  Skin: Warm, dry without exposed rashes, lesions, ecchymosis apparent.  Neuro: Cranial nerves intact, reflexes equal bilaterally. Sensory-motor testing grossly intact. Tendon reflexes grossly intact.  Pysch: Alert & oriented x 3. Insight and judgement nl & appropriate. No ideations.  Assessment and Plan:  1. Hypertension - Continue monitor blood pressure at home. Continue diet/meds same.  2. Hyperlipidemia - Continue diet/meds, exercise,& lifestyle modifications. Continue monitor periodic cholesterol/liver & renal functions   3. Pre-diabetes- screening - Continue diet, exercise, lifestyle modifications. Monitor appropriate labs.  4. Vitamin D Deficiency - Continue supplementation.  5. Morbid Obesity (BMI 38)   Recommended regular exercise, BP monitoring, weight control, and discussed med and SE's. Recommended labs to assess and monitor clinical status and reviewed with patient. Reviewed importance of better diet and weight loss.

## 2013-11-19 ENCOUNTER — Ambulatory Visit (INDEPENDENT_AMBULATORY_CARE_PROVIDER_SITE_OTHER): Payer: BC Managed Care – PPO | Admitting: Internal Medicine

## 2013-11-19 ENCOUNTER — Encounter: Payer: Self-pay | Admitting: Internal Medicine

## 2013-11-19 VITALS — BP 128/72 | HR 64 | Temp 97.8°F | Resp 16 | Wt 207.8 lb

## 2013-11-19 DIAGNOSIS — R7309 Other abnormal glucose: Secondary | ICD-10-CM

## 2013-11-19 DIAGNOSIS — E782 Mixed hyperlipidemia: Secondary | ICD-10-CM

## 2013-11-19 DIAGNOSIS — I10 Essential (primary) hypertension: Secondary | ICD-10-CM

## 2013-11-19 DIAGNOSIS — D649 Anemia, unspecified: Secondary | ICD-10-CM

## 2013-11-19 DIAGNOSIS — E559 Vitamin D deficiency, unspecified: Secondary | ICD-10-CM

## 2013-11-19 DIAGNOSIS — I4891 Unspecified atrial fibrillation: Secondary | ICD-10-CM

## 2013-11-19 MED ORDER — ATORVASTATIN CALCIUM 80 MG PO TABS: 80.0000 mg | ORAL_TABLET | Freq: Every day | ORAL | Status: DC

## 2013-11-19 NOTE — Patient Instructions (Signed)
When finish VYTORIN                                        Start Atorvastatin 80 mg                                                                                         1/2 tablet  3 x per week on Tues   - Thurs -  And Sat     Hypertension As your heart beats, it forces blood through your arteries. This force is your blood pressure. If the pressure is too high, it is called hypertension (HTN) or high blood pressure. HTN is dangerous because you may have it and not know it. High blood pressure may mean that your heart has to work harder to pump blood. Your arteries may be narrow or stiff. The extra work puts you at risk for heart disease, stroke, and other problems.  Blood pressure consists of two numbers, a higher number over a lower, 110/72, for example. It is stated as "110 over 72." The ideal is below 120 for the top number (systolic) and under 80 for the bottom (diastolic). Write down your blood pressure today. You should pay close attention to your blood pressure if you have certain conditions such as:  Heart failure.  Prior heart attack.  Diabetes  Chronic kidney disease.  Prior stroke.  Multiple risk factors for heart disease. To see if you have HTN, your blood pressure should be measured while you are seated with your arm held at the level of the heart. It should be measured at least twice. A one-time elevated blood pressure reading (especially in the Emergency Department) does not mean that you need treatment. There may be conditions in which the blood pressure is different between your right and left arms. It is important to see your caregiver soon for a recheck. Most people have essential hypertension which means that there is not a specific cause. This type of high blood pressure may be lowered by changing lifestyle factors such as:  Stress.  Smoking.  Lack of exercise.  Excessive weight.  Drug/tobacco/alcohol use.  Eating less salt. Most people do not have  symptoms from high blood pressure until it has caused damage to the body. Effective treatment can often prevent, delay or reduce that damage. TREATMENT  When a cause has been identified, treatment for high blood pressure is directed at the cause. There are a large number of medications to treat HTN. These fall into several categories, and your caregiver will help you select the medicines that are best for you. Medications may have side effects. You should review side effects with your caregiver. If your blood pressure stays high after you have made lifestyle changes or started on medicines,   Your medication(s) may need to be changed.  Other problems may need to be addressed.  Be certain you understand your prescriptions, and know how and when to take your medicine.  Be sure to follow up with your caregiver within the time frame advised (usually within  two weeks) to have your blood pressure rechecked and to review your medications.  If you are taking more than one medicine to lower your blood pressure, make sure you know how and at what times they should be taken. Taking two medicines at the same time can result in blood pressure that is too low. SEEK IMMEDIATE MEDICAL CARE IF:  You develop a severe headache, blurred or changing vision, or confusion.  You have unusual weakness or numbness, or a faint feeling.  You have severe chest or abdominal pain, vomiting, or breathing problems. MAKE SURE YOU:   Understand these instructions.  Will watch your condition.  Will get help right away if you are not doing well or get worse.   Diabetes and Exercise Exercising regularly is important. It is not just about losing weight. It has many health benefits, such as:  Improving your overall fitness, flexibility, and endurance.  Increasing your bone density.  Helping with weight control.  Decreasing your body fat.  Increasing your muscle strength.  Reducing stress and tension.  Improving  your overall health. People with diabetes who exercise gain additional benefits because exercise:  Reduces appetite.  Improves the body's use of blood sugar (glucose).  Helps lower or control blood glucose.  Decreases blood pressure.  Helps control blood lipids (such as cholesterol and triglycerides).  Improves the body's use of the hormone insulin by:  Increasing the body's insulin sensitivity.  Reducing the body's insulin needs.  Decreases the risk for heart disease because exercising:  Lowers cholesterol and triglycerides levels.  Increases the levels of good cholesterol (such as high-density lipoproteins [HDL]) in the body.  Lowers blood glucose levels. YOUR ACTIVITY PLAN  Choose an activity that you enjoy and set realistic goals. Your health care provider or diabetes educator can help you make an activity plan that works for you. You can break activities into 2 or 3 sessions throughout the day. Doing so is as good as one long session. Exercise ideas include:  Taking the dog for a walk.  Taking the stairs instead of the elevator.  Dancing to your favorite song.  Doing your favorite exercise with a friend. RECOMMENDATIONS FOR EXERCISING WITH TYPE 1 OR TYPE 2 DIABETES   Check your blood glucose before exercising. If blood glucose levels are greater than 240 mg/dL, check for urine ketones. Do not exercise if ketones are present.  Avoid injecting insulin into areas of the body that are going to be exercised. For example, avoid injecting insulin into:  The arms when playing tennis.  The legs when jogging.  Keep a record of:  Food intake before and after you exercise.  Expected peak times of insulin action.  Blood glucose levels before and after you exercise.  The type and amount of exercise you have done.  Review your records with your health care provider. Your health care provider will help you to develop guidelines for adjusting food intake and insulin  amounts before and after exercising.  If you take insulin or oral hypoglycemic agents, watch for signs and symptoms of hypoglycemia. They include:  Dizziness.  Shaking.  Sweating.  Chills.  Confusion.  Drink plenty of water while you exercise to prevent dehydration or heat stroke. Body water is lost during exercise and must be replaced.  Talk to your health care provider before starting an exercise program to make sure it is safe for you. Remember, almost any type of activity is better than none.    Cholesterol Cholesterol is a  white, waxy, fat-like protein needed by your body in small amounts. The liver makes all the cholesterol you need. It is carried from the liver by the blood through the blood vessels. Deposits (plaque) may build up on blood vessel walls. This makes the arteries narrower and stiffer. Plaque increases the risk for heart attack and stroke. You cannot feel your cholesterol level even if it is very high. The only way to know is by a blood test to check your lipid (fats) levels. Once you know your cholesterol levels, you should keep a record of the test results. Work with your caregiver to to keep your levels in the desired range. WHAT THE RESULTS MEAN:  Total cholesterol is a rough measure of all the cholesterol in your blood.  LDL is the so-called bad cholesterol. This is the type that deposits cholesterol in the walls of the arteries. You want this level to be low.  HDL is the good cholesterol because it cleans the arteries and carries the LDL away. You want this level to be high.  Triglycerides are fat that the body can either burn for energy or store. High levels are closely linked to heart disease. DESIRED LEVELS:  Total cholesterol below 200.  LDL below 100 for people at risk, below 70 for very high risk.  HDL above 50 is good, above 60 is best.  Triglycerides below 150. HOW TO LOWER YOUR CHOLESTEROL:  Diet.  Choose fish or white meat chicken and  [place], roasted or baked. Limit fatty cuts of red meat, fried foods, and processed meats, such as sausage and lunch meat.  Eat lots of fresh fruits and vegetables. Choose whole grains, beans, pasta, potatoes and cereals.  Use only small amounts of olive, corn or canola oils. Avoid butter, mayonnaise, shortening or palm kernel oils. Avoid foods with trans-fats.  Use skim/nonfat milk and low-fat/nonfat yogurt and cheeses. Avoid whole milk, cream, ice cream, egg yolks and cheeses. Healthy desserts include angel food cake, ginger snaps, animal crackers, hard candy, popsicles, and low-fat/nonfat frozen yogurt. Avoid pastries, cakes, pies and cookies.  Exercise.  A regular program helps decrease LDL and raises HDL.  Helps with weight control.  Do things that increase your activity level like gardening, walking, or taking the stairs.  Medication.  May be prescribed by your caregiver to help lowering cholesterol and the risk for heart disease.  You may need medicine even if your levels are normal if you have several risk factors. HOME CARE INSTRUCTIONS   Follow your diet and exercise programs as suggested by your caregiver.  Take medications as directed.  Have blood work done when your caregiver feels it is necessary. MAKE SURE YOU:   Understand these instructions.  Will watch your condition.  Will get help right away if you are not doing well or get worse.      Vitamin D Deficiency Vitamin D is an important vitamin that your body needs. Having too little of it in your body is called a deficiency. A very bad deficiency can make your bones soft and can cause a condition called rickets.  Vitamin D is important to your body for different reasons, such as:   It helps your body absorb 2 minerals called calcium and phosphorus.  It helps make your bones healthy.  It may prevent some diseases, such as diabetes and multiple sclerosis.  It helps your muscles and heart. You can get  vitamin D in several ways. It is a natural part of some foods.  The vitamin is also added to some dairy products and cereals. Some people take vitamin D supplements. Also, your body makes vitamin D when you are in the sun. It changes the sun's rays into a form of the vitamin that your body can use. CAUSES   Not eating enough foods that contain vitamin D.  Not getting enough sunlight.  Having certain digestive system diseases that make it hard to absorb vitamin D. These diseases include Crohn's disease, chronic pancreatitis, and cystic fibrosis.  Having a surgery in which part of the stomach or small intestine is removed.  Being obese. Fat cells pull vitamin D out of your blood. That means that obese people may not have enough vitamin D left in their blood and in other body tissues.  Having chronic kidney or liver disease. RISK FACTORS Risk factors are things that make you more likely to develop a vitamin D deficiency. They include:  Being older.  Not being able to get outside very much.  Living in a nursing home.  Having had broken bones.  Having weak or thin bones (osteoporosis).  Having a disease or condition that changes how your body absorbs vitamin D.  Having dark skin.  Some medicines such as seizure medicines or steroids.  Being overweight or obese. SYMPTOMS Mild cases of vitamin D deficiency may not have any symptoms. If you have a very bad case, symptoms may include:  Bone pain.  Muscle pain.  Falling often.  Broken bones caused by a minor injury, due to osteoporosis. DIAGNOSIS A blood test is the best way to tell if you have a vitamin D deficiency. TREATMENT Vitamin D deficiency can be treated in different ways. Treatment for vitamin D deficiency depends on what is causing it. Options include:  Taking vitamin D supplements.  Taking a calcium supplement. Your caregiver will suggest what dose is best for you. HOME CARE INSTRUCTIONS  Take any supplements  that your caregiver prescribes. Follow the directions carefully. Take only the suggested amount.  Have your blood tested 2 months after you start taking supplements.  Eat foods that contain vitamin D. Healthy choices include:  Fortified dairy products, cereals, or juices. Fortified means vitamin D has been added to the food. Check the label on the package to be sure.  Fatty fish like salmon or trout.  Eggs.  Oysters.  Do not use a tanning bed.  Keep your weight at a healthy level. Lose weight if you need to.  Keep all follow-up appointments. Your caregiver will need to perform blood tests to make sure your vitamin D deficiency is going away. SEEK MEDICAL CARE IF:  You have any questions about your treatment.  You continue to have symptoms of vitamin D deficiency.  You have nausea or vomiting.  You are constipated.  You feel confused.  You have severe abdominal or back pain. MAKE SURE YOU:  Understand these instructions.  Will watch your condition.  Will get help right away if you are not doing well or get worse.

## 2013-11-26 ENCOUNTER — Ambulatory Visit (INDEPENDENT_AMBULATORY_CARE_PROVIDER_SITE_OTHER): Payer: BC Managed Care – PPO | Admitting: Emergency Medicine

## 2013-11-26 ENCOUNTER — Encounter: Payer: Self-pay | Admitting: Emergency Medicine

## 2013-11-26 VITALS — BP 117/70 | HR 78 | Temp 98.0°F | Resp 18 | Ht 61.5 in | Wt 198.0 lb

## 2013-11-26 DIAGNOSIS — J029 Acute pharyngitis, unspecified: Secondary | ICD-10-CM

## 2013-11-26 DIAGNOSIS — J309 Allergic rhinitis, unspecified: Secondary | ICD-10-CM

## 2013-11-26 DIAGNOSIS — J40 Bronchitis, not specified as acute or chronic: Secondary | ICD-10-CM

## 2013-11-26 MED ORDER — BENZONATATE 100 MG PO CAPS: 100.0000 mg | ORAL_CAPSULE | Freq: Three times a day (TID) | ORAL | Status: DC | PRN

## 2013-11-26 MED ORDER — AZITHROMYCIN 250 MG PO TABS: ORAL_TABLET | ORAL | Status: AC

## 2013-11-26 MED ORDER — PREDNISONE 10 MG PO TABS: ORAL_TABLET | ORAL | Status: DC

## 2013-11-26 NOTE — Patient Instructions (Signed)
Laryngitis Warm salt water gargles daily. 1 tsp liquid benadryl + 1 tsp liquid Maalox, MIX/ GARGLE/ SPIT as needed for pain  Laryngitis is redness, soreness, and puffiness (inflammation) of the vocal cords. It causes hoarseness, cough, loss of voice, sore throat, and dry throat. It may be caused by:  Infection.  Too much smoking.  Too much talking or yelling.  Breathing in of toxic fumes.  Allergies.  A backup of acid from your stomach. HOME CARE  Drink enough fluids to keep your pee (urine) clear or pale yellow.  Rest until you no longer have problems or as told by your doctor.  Breathe in moist air.  Take all medicine as told by your doctor.  Do not smoke.  Talk as little as possible (this includes whispering).  Write on paper instead of talking until your voice is back to normal.  Follow up with your doctor if you have not improved after 10 days. GET HELP IF:   You have trouble breathing.  You cough up blood.  You have a fever that will not go away.  You have increasing pain.  You have trouble swallowing. MAKE SURE YOU:  Understand these instructions.  Will watch your condition.  Will get help right away if you are not doing well or get worse. Document Released: 09/23/2011 Document Revised: 12/27/2011 Document Reviewed: 09/23/2011 [facility] Patient Information 2014 [place], [place].

## 2013-11-26 NOTE — Progress Notes (Signed)
Subjective:    Patient ID: [name], female    DOB: 1956/04/04, 58 y.o.   MRN: [id number]  HPI Comments: 58 YO FEMALE HAS BEEN SICK SINCE FRIDAY WITH CHILLS/ FEVER. She was coughing so hard today that it made her vomit. She has been taking Nyquil without relief. She has felt more tired. She notes her voice goes in/ out.   Cough Associated symptoms include chills, a fever and a sore throat.  Emesis  Associated symptoms include chills, coughing and a fever.   Current Outpatient Prescriptions on File Prior to Visit  Medication Sig Dispense Refill  . ascorbic acid (VITAMIN C) 1000 MG tablet Take 1,000 mg by mouth daily.      Marland Kitchen atenolol (TENORMIN) 100 MG tablet PT TAKES ( 1/2 OF 100 MG )  90 tablet  1  . atorvastatin (LIPITOR) 80 MG tablet Take 1 tablet (80 mg total) by mouth daily. For cholesterol  30 tablet  99  . Cholecalciferol (VITAMIN D PO) Take 4,000 Units by mouth daily.       Marland Kitchen diltiazem (TIAZAC) 120 MG 24 hr capsule Take 1 capsule (120 mg total) by mouth daily.  90 capsule  0  . doxepin (SINEQUAN) 75 MG capsule Take 1 capsule (75 mg total) by mouth at bedtime.  30 capsule  1  . Iron Combinations (IRON COMPLEX PO) Take 1 tablet by mouth daily.      . Olopatadine HCl (PATADAY) 0.2 % SOLN Apply 1 drop to eye daily.      . potassium chloride SA (K-DUR,KLOR-CON) 20 MEQ tablet Take 1 tablet (20 mEq total) by mouth 2 (two) times daily.  180 tablet  0  . triamterene-hydrochlorothiazide (MAXZIDE-25) 37.5-25 MG per tablet Take 1 tablet by mouth daily.       No current facility-administered medications on file prior to visit.   Allergies  Allergen Reactions  . Penicillins Other (See Comments)    DOESN'T REMEMBER   Past Medical History  Diagnosis Date  . MRSA (methicillin resistant staph aureus) culture positive   . Eczema   . Hypercholesteremia   . Hypertension   . Kidney stones   . Anemia   . Vitamin D deficiency   . A-fib       Review of Systems  Constitutional:  Positive for fever and chills.  HENT: Positive for congestion, sore throat and voice change.   Respiratory: Positive for cough.   Gastrointestinal: Positive for vomiting.  All other systems reviewed and are negative.   BP 117/70  Pulse 78  Temp(Src) 98 F (36.7 C) (Temporal)  Resp 18  Ht 5' 1.5" (1.562 m)  Wt 198 lb (89.812 kg)  BMI 36.81 kg/m2      Objective:   Physical Exam  Nursing note and vitals reviewed. Constitutional: She is oriented to person, place, and time. She appears well-developed and well-nourished.  HENT:  Head: Normocephalic and atraumatic.  Right Ear: External ear normal.  Left Ear: External ear normal.  Nose: Nose normal.  Mouth/Throat: Oropharyngeal exudate present.  Erythematous, hoarse voice  Eyes: Conjunctivae and EOM are normal.  Neck: Normal range of motion.  Cardiovascular: Normal rate, regular rhythm, normal heart sounds and intact distal pulses.   Pulmonary/Chest: Effort normal.  Congested Breath sounds, clears some with cough   Musculoskeletal: Normal range of motion.  Lymphadenopathy:    She has no cervical adenopathy.  Neurological: She is alert and oriented to person, place, and time.  Skin: Skin is warm and  dry.  Psychiatric: She has a normal mood and affect. Judgment normal.          Assessment & Plan:  Bronchitis, pharyngitis, Allergic rhinitis- Allegra OTC, increase H2o, allergy hygiene explained. Zpak/ Pred dp 10 mg/ Tessalon perles 100 mg all AD, w/c if SX increase or ER.

## 2013-11-29 ENCOUNTER — Other Ambulatory Visit: Payer: Self-pay | Admitting: Emergency Medicine

## 2013-11-29 ENCOUNTER — Telehealth: Payer: Self-pay | Admitting: Internal Medicine

## 2013-11-29 MED ORDER — DOXEPIN HCL 75 MG PO CAPS
75.0000 mg | ORAL_CAPSULE | Freq: Every day | ORAL | Status: DC
Start: 2013-11-29 — End: 2014-01-31

## 2013-11-29 NOTE — Telephone Encounter (Signed)
PT REQUESTING REFILL OF DOXIPEN 75MG   1 AT BEDTIME.  PT CALLED AND SAID SHE IS OUT AND PHARM FAXED TWICE.  THANKS KATRINA

## 2013-12-05 ENCOUNTER — Encounter: Payer: Self-pay | Admitting: Emergency Medicine

## 2013-12-05 ENCOUNTER — Ambulatory Visit (INDEPENDENT_AMBULATORY_CARE_PROVIDER_SITE_OTHER): Payer: BC Managed Care – PPO | Admitting: Emergency Medicine

## 2013-12-05 VITALS — BP 144/100 | HR 98 | Temp 97.8°F | Resp 18 | Ht 62.0 in | Wt 197.0 lb

## 2013-12-05 DIAGNOSIS — I10 Essential (primary) hypertension: Secondary | ICD-10-CM

## 2013-12-05 DIAGNOSIS — F411 Generalized anxiety disorder: Secondary | ICD-10-CM

## 2013-12-05 DIAGNOSIS — F419 Anxiety disorder, unspecified: Secondary | ICD-10-CM

## 2013-12-05 MED ORDER — DILTIAZEM HCL ER BEADS 120 MG PO CP24: 120.0000 mg | ORAL_CAPSULE | Freq: Every day | ORAL | Status: DC

## 2013-12-05 NOTE — Progress Notes (Signed)
Subjective:    Patient ID: [name], female    DOB: August 25, 1956, 58 y.o.   MRN: [id number]  HPI Comments: 58 yo female with increased stress presents in tears with her daughter. She notes she has been living with her sister and mother whom has dementia and notes sister has become increasingly irritable. She notes sister makes her feel like she is "crazy" and worthless. She notes she was only living there to help take care of her mother. 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 Daughter notes mother has been very upset over the last week or so but started over 1 month ago with the increased mistreatment. She denies any increase due to prednisone and notes recent RX has helped her energy level. She notes stress is making it difficult for her to sleep at night. She does not want a RX for stress or sleep at this time.   Current Outpatient Prescriptions on File Prior to Visit  Medication Sig Dispense Refill  . ascorbic acid (VITAMIN C) 1000 MG tablet Take 1,000 mg by mouth daily.      Marland Kitchen atenolol (TENORMIN) 100 MG tablet PT TAKES ( 1/2 OF 100 MG )  90 tablet  1  . atorvastatin (LIPITOR) 80 MG tablet Take 1 tablet (80 mg total) by mouth daily. For cholesterol  30 tablet  99  . benzonatate (TESSALON PERLES) 100 MG capsule Take 1 capsule (100 mg total) by mouth 3 (three) times daily as needed for cough.  42 capsule  1  . Cholecalciferol (VITAMIN D PO) Take 4,000 Units by mouth daily.       Marland Kitchen doxepin (SINEQUAN) 75 MG capsule Take 1 capsule (75 mg total) by mouth at bedtime.  30 capsule  1  . Iron Combinations (IRON COMPLEX PO) Take 1 tablet by mouth daily.      . Olopatadine HCl (PATADAY) 0.2 % SOLN Apply 1 drop to eye daily.      . potassium chloride SA (K-DUR,KLOR-CON) 20 MEQ tablet Take 1 tablet (20 mEq total) by mouth 2 (two) times daily.  180 tablet  0  . predniSONE (DELTASONE) 10 MG tablet 1 po TID x 3 days, 1 PO BID x 3 days, 1 po QD x 5 days  20 tablet  0  .  triamterene-hydrochlorothiazide (MAXZIDE-25) 37.5-25 MG per tablet Take 1 tablet by mouth daily.       No current facility-administered medications on file prior to visit.   Allergies  Allergen Reactions  . Penicillins Other (See Comments)    DOESN'T REMEMBER   Past Medical History  Diagnosis Date  . MRSA (methicillin resistant staph aureus) culture positive   . Eczema   . Hypercholesteremia   . Hypertension   . Kidney stones   . Anemia   . Vitamin D deficiency   . A-fib       Review of Systems  Constitutional: Positive for fatigue.  Psychiatric/Behavioral: Positive for sleep disturbance. Negative for suicidal ideas. The patient is nervous/anxious.   All other systems reviewed and are negative.   BP 144/100  Pulse 98  Temp(Src) 97.8 F (36.6 C) (Temporal)  Resp 18  Ht 5\' 2"  (1.575 m)  Wt 197 lb (89.359 kg)  BMI 36.02 kg/m2     Objective:   Physical Exam  Nursing note and vitals reviewed. Constitutional: She is oriented to person, place, and time. She appears well-developed and well-nourished.  HENT:  Head: Normocephalic and atraumatic.  Right  Ear: External ear normal.  Left Ear: External ear normal.  Nose: Nose normal.  Mouth/Throat: Oropharynx is clear and moist.  Eyes: Conjunctivae and EOM are normal.  Neck: Normal range of motion.  Cardiovascular: Normal rate, regular rhythm, normal heart sounds and intact distal pulses.   Pulmonary/Chest: Effort normal and breath sounds normal.  Musculoskeletal: Normal range of motion.  Lymphadenopathy:    She has no cervical adenopathy.  Neurological: She is alert and oriented to person, place, and time.  Skin: Skin is warm and dry.  Psychiatric: She has a normal mood and affect. Judgment normal.  Tearful but appropriate          Assessment & Plan:  1. Stress/ Anxiety/ sleep disturbance- sleep hygiene explained, stress reduction advised/ move back to her own home. w/c if SX increase or ER, w/c if wants RX 2.  HTN- Check BP call if >130/80, increase cardio

## 2013-12-05 NOTE — Patient Instructions (Signed)
Depression, Adult °Depression is feeling sad, low, down in the dumps, blue, gloomy, or empty. In general, there are two kinds of depression: °· Normal sadness or grief. This can happen after something upsetting. It often goes away on its own within 2 weeks. After losing a loved one (bereavement), normal sadness and grief may last longer than two weeks. It usually gets better with time. °· Clinical depression. This kind lasts longer than normal sadness or grief. It keeps you from doing the things you normally do in life. It is often hard to function at home, work, or at school. It may affect your relationships with others. Treatment is often needed. °GET HELP RIGHT AWAY IF: °· You have thoughts about hurting yourself or others. °· You lose touch with reality (psychotic symptoms). You may: °· See or hear things that are not real. °· Have untrue beliefs about your life or people around you. °· Your medicine is giving you problems. °MAKE SURE YOU: °· Understand these instructions. °· Will watch your condition. °· Will get help right away if you are not doing well or get worse. °Document Released: 11/06/2010 Document Revised: 06/28/2012 Document Reviewed: 02/03/2012 °[employer]® Patient Information ©2014 [employer]. ° °Generalized Anxiety Disorder °Generalized anxiety disorder (GAD) is a mental disorder. It interferes with life functions, including relationships, work, and school. °GAD is different from normal anxiety, which everyone experiences at some point in their lives in response to specific life events and activities. Normal anxiety actually helps us prepare for and get through these life events and activities. Normal anxiety goes away after the event or activity is over.  °GAD causes anxiety that is not necessarily related to specific events or activities. It also causes excess anxiety in proportion to specific events or activities. The anxiety associated with GAD is also difficult to control. GAD can vary from  mild to severe. People with severe GAD can have intense waves of anxiety with physical symptoms (panic attacks).  °SYMPTOMS °The anxiety and worry associated with GAD are difficult to control. This anxiety and worry are related to many life events and activities and also occur more days than not for 6 months or longer. People with GAD also have three or more of the following symptoms (one or more in children): °· Restlessness.   °· Fatigue. °· Difficulty concentrating.   °· Irritability. °· Muscle tension. °· Difficulty sleeping or unsatisfying sleep. °DIAGNOSIS °GAD is diagnosed through an assessment by your caregiver. Your caregiver will ask you questions about your mood, physical symptoms, and events in your life. Your caregiver may ask you about your medical history and use of alcohol or drugs, including prescription medications. Your caregiver may also do a physical exam and blood tests. Certain medical conditions and the use of certain substances can cause symptoms similar to those associated with GAD. Your caregiver may refer you to a mental health specialist for further evaluation. °TREATMENT °The following therapies are usually used to treat GAD:  °· Medication Antidepressant medication usually is prescribed for long-term daily control. Antianxiety medications may be added in severe cases, especially when panic attacks occur.   °· Talk therapy (psychotherapy) Certain types of talk therapy can be helpful in treating GAD by providing support, education, and guidance. A form of talk therapy called cognitive behavioral therapy can teach you healthy ways to think about and react to daily life events and activities. °· Stress management techniques These include yoga, meditation, and exercise and can be very helpful when they are practiced regularly. °A mental health specialist can   help determine which treatment is best for you. Some people see improvement with one therapy. However, other people require a  combination of therapies. °Document Released: 01/29/2013 Document Reviewed: 01/29/2013 °[employer]® Patient Information ©2014 [employer]. ° °

## 2014-01-17 ENCOUNTER — Other Ambulatory Visit: Payer: Self-pay | Admitting: Physician Assistant

## 2014-01-31 ENCOUNTER — Other Ambulatory Visit: Payer: Self-pay | Admitting: *Deleted

## 2014-01-31 MED ORDER — DOXEPIN HCL 75 MG PO CAPS: 75.0000 mg | ORAL_CAPSULE | Freq: Every day | ORAL | Status: DC

## 2014-03-05 ENCOUNTER — Ambulatory Visit: Payer: Self-pay | Admitting: Emergency Medicine

## 2014-03-05 ENCOUNTER — Other Ambulatory Visit: Payer: Self-pay | Admitting: Emergency Medicine

## 2014-03-05 MED ORDER — DILTIAZEM HCL ER BEADS 120 MG PO CP24: 120.0000 mg | ORAL_CAPSULE | Freq: Every day | ORAL | Status: DC

## 2014-04-04 ENCOUNTER — Other Ambulatory Visit: Payer: Self-pay | Admitting: Internal Medicine

## 2014-04-24 ENCOUNTER — Other Ambulatory Visit: Payer: Self-pay | Admitting: Physician Assistant

## 2014-04-25 ENCOUNTER — Other Ambulatory Visit: Payer: Self-pay | Admitting: Emergency Medicine

## 2014-04-25 ENCOUNTER — Other Ambulatory Visit (HOSPITAL_COMMUNITY)
Admission: RE | Admit: 2014-04-25 | Discharge: 2014-04-25 | Disposition: A | Discharge status: Home / Self Care | Payer: BC Managed Care – PPO | Source: Ambulatory Visit | Attending: Internal Medicine | Admitting: Internal Medicine

## 2014-04-25 ENCOUNTER — Ambulatory Visit (INDEPENDENT_AMBULATORY_CARE_PROVIDER_SITE_OTHER): Payer: BC Managed Care – PPO | Admitting: Emergency Medicine

## 2014-04-25 ENCOUNTER — Encounter: Payer: Self-pay | Admitting: Emergency Medicine

## 2014-04-25 VITALS — BP 124/86 | HR 68 | Temp 97.8°F | Resp 16 | Ht 61.5 in | Wt 204.0 lb

## 2014-04-25 DIAGNOSIS — E782 Mixed hyperlipidemia: Secondary | ICD-10-CM

## 2014-04-25 DIAGNOSIS — N390 Urinary tract infection, site not specified: Secondary | ICD-10-CM

## 2014-04-25 DIAGNOSIS — I10 Essential (primary) hypertension: Secondary | ICD-10-CM

## 2014-04-25 DIAGNOSIS — Z124 Encounter for screening for malignant neoplasm of cervix: Secondary | ICD-10-CM

## 2014-04-25 DIAGNOSIS — Z79899 Other long term (current) drug therapy: Secondary | ICD-10-CM

## 2014-04-25 DIAGNOSIS — Z0000 Encounter for general adult medical examination without abnormal findings: Secondary | ICD-10-CM

## 2014-04-25 DIAGNOSIS — Z01419 Encounter for gynecological examination (general) (routine) without abnormal findings: Secondary | ICD-10-CM | POA: Insufficient documentation

## 2014-04-25 DIAGNOSIS — Z1231 Encounter for screening mammogram for malignant neoplasm of breast: Secondary | ICD-10-CM

## 2014-04-25 DIAGNOSIS — E559 Vitamin D deficiency, unspecified: Secondary | ICD-10-CM

## 2014-04-25 DIAGNOSIS — Z1212 Encounter for screening for malignant neoplasm of rectum: Secondary | ICD-10-CM

## 2014-04-25 DIAGNOSIS — M810 Age-related osteoporosis without current pathological fracture: Secondary | ICD-10-CM

## 2014-04-25 DIAGNOSIS — Z111 Encounter for screening for respiratory tuberculosis: Secondary | ICD-10-CM

## 2014-04-25 DIAGNOSIS — R7309 Other abnormal glucose: Secondary | ICD-10-CM

## 2014-04-25 DIAGNOSIS — D649 Anemia, unspecified: Secondary | ICD-10-CM

## 2014-04-25 LAB — CBC WITH DIFFERENTIAL/PLATELET
BASOS PCT: 0 % (ref 0–1)
Basophils Absolute: 0 10*3/uL (ref 0.0–0.1)
Eosinophils Absolute: 0.2 10*3/uL (ref 0.0–0.7)
Eosinophils Relative: 3 % (ref 0–5)
HEMATOCRIT: 41.5 % (ref 36.0–46.0)
HEMOGLOBIN: 14.5 g/dL (ref 12.0–15.0)
LYMPHS PCT: 30 % (ref 12–46)
Lymphs Abs: 1.6 10*3/uL (ref 0.7–4.0)
MCH: 30.7 pg (ref 26.0–34.0)
MCHC: 34.9 g/dL (ref 30.0–36.0)
MCV: 87.7 fL (ref 78.0–100.0)
MONO ABS: 0.6 10*3/uL (ref 0.1–1.0)
MONOS PCT: 11 % (ref 3–12)
NEUTROS ABS: 3 10*3/uL (ref 1.7–7.7)
Neutrophils Relative %: 56 % (ref 43–77)
Platelets: 314 10*3/uL (ref 150–400)
RBC: 4.73 MIL/uL (ref 3.87–5.11)
RDW: 15.2 % (ref 11.5–15.5)
WBC: 5.4 10*3/uL (ref 4.0–10.5)

## 2014-04-25 LAB — HEMOGLOBIN A1C
Hgb A1c MFr Bld: 5.5 % (ref <5.7)
MEAN PLASMA GLUCOSE: 111 mg/dL (ref <117)

## 2014-04-25 NOTE — Patient Instructions (Signed)
Tuberculin Skin Test The PPD skin test is a method used to help with the diagnosis of a disease called tuberculosis (TB). HOW THE TEST IS DONE  The test site (usually the forearm) is cleansed. The PPD extract is then injected under the top layer of skin, causing a blister to form on the skin. The reaction will take 48 - 72 hours to develop. You must return to your health care provider within that time to have the area checked. This will determine whether you have had a significant reaction to the PPD test. A reaction is measured in millimeters of hard swelling (induration) at the site. PREPARATION FOR TEST  There is no special preparation for this test. People with a skin rash or other skin irritations on their arms may need to have the test performed at a different spot on the body. Tell your health care provider if you have ever had a positive PPD skin test. If so, you should not have a repeat PPD test. Tell your doctor if you have a medical condition or if you take certain drugs, such as steroids, that can affect your immune system. These situations may lead to inaccurate test results. NORMAL FINDINGS A negative reaction (no induration) or a level of hard swelling that falls below a certain cutoff may mean that a person has not been infected with the bacteria that cause TB. There are different cutoffs for children, people with HIV, and other risk groups. Unfortunately, this is not a perfect test, and up to 20% of people infected with tuberculosis may not have a reaction on the PPD skin test. In addition, certain conditions that affect the immune system (cancer, recent chemotherapy, late-stage AIDS) may cause a false-negative test result.  The reaction will take 48 - 72 hours to develop. You must return to your health care provider within that time to have the area checked. Follow your caregiver's instructions as to where and when to report for this to be done. Ranges for normal findings may vary  among different laboratories and hospitals. You should always check with your doctor after having lab work or other tests done to discuss the meaning of your test results and whether your values are considered within normal limits. WHAT ABNORMAL RESULTS MEAN  The results of the test depend on the size of the skin reaction and on the person being tested.  A small reaction (5 mm of hard swelling at the site) is considered to be positive in people who have HIV, who are taking steroid therapy, or who have been in close contact with a person who has active tuberculosis. Larger reactions (greater than or equal to 10 mm) are considered positive in people with diabetes or kidney failure, and in health care workers, among others. In people with no known risks for tuberculosis, a positive reaction requires 15 mm or more of hard swelling at the site. RISKS AND COMPLICATIONS There is a very small risk of severe redness and swelling of the arm in people who have had a previous positive PPD test and who have the test again. There also have been a few rare cases of this reaction in people who have not been tested before. CONSIDERATIONS  A positive skin test does not necessarily mean that a person has active tuberculosis. More tests will be done to check whether active disease is present. Many people who were born outside the [place] may have had a vaccine called "BCG," which can lead to a false-positive test   result. MEANING OF TEST  Your caregiver will go over the test results with you and discuss the importance and meaning of your results, as well as treatment options and the need for additional tests if necessary. OBTAINING THE TEST RESULTS It is your responsibility to obtain your test results. Ask the lab or department performing the test when and how you will get your results. Document Released: 07/14/2005 Document Revised: 12/27/2011 Document Reviewed: 09/15/2008 [facility] Patient Information 2015  [place], [place]. This information is not intended to replace advice given to you by your health care provider. Make sure you discuss any questions you have with your health care provider.

## 2014-04-25 NOTE — Progress Notes (Signed)
Subjective:    Patient ID: [name], female    DOB: 13-Nov-1955, 58 y.o.   MRN: [id number]  HPI Comments: 58 yo AAF CPE and presents for 3 month F/U for HTN, Cholesterol, Pre-Dm, D. deficient . She is taking 1/3 of Lipitor tab on TU TH SA. She was on Vytorin 10/40 at last lab visit, she was changed due to insurance coverage. She is trying to improve diet and walk moe for exercise. She notes BP good at home.  She notes she is overall feeling well. Her eczema seems to be more controlled and she see's derm routinely.   WBC             5.9   11/13/2013 HGB            14.3   11/13/2013 HCT            42.3   11/13/2013 PLT             325   11/13/2013 GLUCOSE          72   11/13/2013 CHOL            150   11/13/2013 TRIG             76   11/13/2013 HDL              72   11/13/2013 LDLCALC          63   11/13/2013 ALT              15   11/13/2013 AST              16   11/13/2013 NA              137   11/13/2013 K               4.2   11/13/2013 CL              101   11/13/2013 CREATININE     0.75   11/13/2013 BUN              17   11/13/2013 CO2              28   11/13/2013 TSH           1.664   11/13/2013 INR            1.03   10/27/2011 HGBA1C          5.5   11/13/2013   Hypertension  Hyperlipidemia      Medication List       This list is accurate as of: 04/25/14 11:59 PM.  Always use your most recent med list.               ascorbic acid 1000 MG tablet  Commonly known as:  VITAMIN C  Take 1,000 mg by mouth daily.     atenolol 100 MG tablet  Commonly known as:  TENORMIN  PT TAKES ( 1/2 OF 100 MG )     atorvastatin 80 MG tablet  Commonly known as:  LIPITOR  Take 1 tablet (80 mg total) by mouth daily. For cholesterol     diltiazem 120 MG 24 hr capsule  Commonly known as:  TIAZAC  Take 1 capsule (120 mg total) by mouth daily.     doxepin 75 MG capsule  Commonly known as:  SINEQUAN  take 1 capsule by mouth at bedtime  IRON COMPLEX PO  Take 1 tablet by mouth daily.      PATADAY 0.2 % Soln  Generic drug:  Olopatadine HCl  Apply 1 drop to eye daily.     potassium chloride SA 20 MEQ tablet  Commonly known as:  K-DUR,KLOR-CON  take 1 tablet by mouth twice a day     triamterene-hydrochlorothiazide 37.5-25 MG per tablet  Commonly known as:  MAXZIDE-25  Take 1 tablet by mouth daily.     VITAMIN D PO  Take 4,000 Units by mouth daily.       Allergies  Allergen Reactions  . Penicillins Other (See Comments)    DOESN'T REMEMBER   Past Medical History  Diagnosis Date  . MRSA (methicillin resistant staph aureus) culture positive   . Eczema   . Hypercholesteremia   . Hypertension   . Kidney stones   . Anemia   . Vitamin D deficiency   . A-fib    Past Surgical History  Procedure Laterality Date  . Breast surgery Left     biopsy-neg   History  Substance Use Topics  . Smoking status: Never Smoker   . Smokeless tobacco: Not on file  . Alcohol Use: No   Family History  Problem Relation Age of Onset  . Hypertension Mother   . Hepatitis C Mother   . Heart disease Father   . 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  BP 124/86  Pulse 68  Temp(Src) 97.8 F (36.6 C) (Temporal)  Resp 16  Ht 5' 1.5" (1.562 m)  Wt 204 lb (92.534 kg)  BMI 37.93 kg/m2     Objective:   Physical Exam  Nursing note and vitals reviewed. Constitutional: She is oriented to person, place, and time. She appears well-developed and well-nourished. No  distress.  obese  HENT:  Head: Normocephalic and atraumatic.  Right Ear: External ear normal.  Left Ear: External ear normal.  Nose: Nose normal.  Mouth/Throat: Oropharynx is clear and moist.  Eyes: Conjunctivae and EOM are normal. Pupils are equal, round, and reactive to light. Right eye exhibits no discharge. Left eye exhibits no discharge. No scleral icterus.  Neck: Normal range of motion. Neck supple. No JVD present. No tracheal deviation present. No thyromegaly present.  Cardiovascular: Normal rate, regular rhythm, normal heart sounds and intact distal pulses.   Pulmonary/Chest: Effort normal and breath sounds normal.  Abdominal: Soft. Bowel sounds are normal. She exhibits no distension and no mass. There is no tenderness. There is no rebound and no guarding.  Genitourinary: Vagina normal and uterus normal. No vaginal discharge found.  Breasts- WNL  Musculoskeletal: Normal range of motion. She exhibits no edema and no tenderness.  Lymphadenopathy:    She has no cervical adenopathy.  Neurological: She is alert and oriented to person, place, and time. She has normal reflexes. No cranial nerve deficit. She exhibits normal muscle tone. Coordination normal.  Skin: Skin is warm and dry. No rash noted. No erythema. No pallor.  Mild scaling in ears  Psychiatric: She has a normal mood and affect. Her behavior is normal.  Judgment and thought content normal.      AORTA SCAN WNL EKG NSCSPT     Assessment & Plan:  1. CPE- Update screening labs/ History/ Immunizations/ Testing as needed. Advised healthy diet, QD exercise, increase H20 and continue RX/ Vitamins AD.  2. 3 month F/U for HTN, Cholesterol, Pre-Dm, D. Deficient. Needs healthy diet, cardio QD and obtain healthy weight. Check Labs, Check BP if >130/80 call office   3. Obesity- Continue weight loss, increase activity and better diet. Pt aware of risks. Check labs

## 2014-04-26 LAB — INSULIN, FASTING: Insulin fasting, serum: 10 u[IU]/mL (ref 3–28)

## 2014-04-26 LAB — URINALYSIS, MICROSCOPIC ONLY
BACTERIA UA: NONE SEEN
Casts: NONE SEEN
Squamous Epithelial / LPF: NONE SEEN

## 2014-04-26 LAB — IRON AND TIBC
%SAT: 22 % (ref 20–55)
IRON: 59 ug/dL (ref 42–145)
TIBC: 270 ug/dL (ref 250–470)
UIBC: 211 ug/dL (ref 125–400)

## 2014-04-26 LAB — LIPID PANEL
Cholesterol: 200 mg/dL (ref 0–200)
HDL: 79 mg/dL (ref >39)
LDL Cholesterol: 111 mg/dL — ABNORMAL HIGH (ref 0–99)
Total CHOL/HDL Ratio: 2.5 Ratio
Triglycerides: 49 mg/dL (ref <150)
VLDL: 10 mg/dL (ref 0–40)

## 2014-04-26 LAB — BASIC METABOLIC PANEL WITH GFR
BUN: 14 mg/dL (ref 6–23)
CO2: 27 mEq/L (ref 19–32)
Calcium: 9.8 mg/dL (ref 8.4–10.5)
Chloride: 100 mEq/L (ref 96–112)
Creat: 0.85 mg/dL (ref 0.50–1.10)
GFR, EST AFRICAN AMERICAN: 87 mL/min
GFR, Est Non African American: 75 mL/min
GLUCOSE: 83 mg/dL (ref 70–99)
POTASSIUM: 3.5 meq/L (ref 3.5–5.3)
Sodium: 140 mEq/L (ref 135–145)

## 2014-04-26 LAB — MICROALBUMIN / CREATININE URINE RATIO
Creatinine, Urine: 164 mg/dL
Microalb Creat Ratio: 88 mg/g — ABNORMAL HIGH (ref 0.0–30.0)
Microalb, Ur: 14.44 mg/dL — ABNORMAL HIGH (ref 0.00–1.89)

## 2014-04-26 LAB — HEPATIC FUNCTION PANEL
ALT: 18 U/L (ref 0–35)
AST: 17 U/L (ref 0–37)
Albumin: 4.1 g/dL (ref 3.5–5.2)
Alkaline Phosphatase: 84 U/L (ref 39–117)
Bilirubin, Direct: 0.1 mg/dL (ref 0.0–0.3)
Indirect Bilirubin: 0.3 mg/dL (ref 0.2–1.2)
Total Bilirubin: 0.4 mg/dL (ref 0.2–1.2)
Total Protein: 7 g/dL (ref 6.0–8.3)

## 2014-04-26 LAB — TSH: TSH: 1.759 u[IU]/mL (ref 0.350–4.500)

## 2014-04-26 LAB — URINALYSIS, ROUTINE W REFLEX MICROSCOPIC
Bilirubin Urine: NEGATIVE
Glucose, UA: NEGATIVE mg/dL
Ketones, ur: NEGATIVE mg/dL
Nitrite: NEGATIVE
Protein, ur: 30 mg/dL — AB
Specific Gravity, Urine: 1.015 (ref 1.005–1.030)
Urobilinogen, UA: 0.2 mg/dL (ref 0.0–1.0)
pH: 6 (ref 5.0–8.0)

## 2014-04-26 LAB — MAGNESIUM: Magnesium: 2 mg/dL (ref 1.5–2.5)

## 2014-04-26 LAB — VITAMIN D 25 HYDROXY (VIT D DEFICIENCY, FRACTURES): Vit D, 25-Hydroxy: 61 ng/mL (ref 30–89)

## 2014-04-27 LAB — URINE CULTURE

## 2014-04-28 ENCOUNTER — Other Ambulatory Visit: Payer: Self-pay | Admitting: Emergency Medicine

## 2014-04-28 MED ORDER — CIPROFLOXACIN HCL 250 MG PO TABS: 250.0000 mg | ORAL_TABLET | Freq: Two times a day (BID) | ORAL | Status: AC

## 2014-04-29 LAB — CYTOLOGY - PAP

## 2014-04-29 LAB — TB SKIN TEST
INDURATION: 0 mm
TB SKIN TEST: NEGATIVE

## 2014-05-06 ENCOUNTER — Other Ambulatory Visit (INDEPENDENT_AMBULATORY_CARE_PROVIDER_SITE_OTHER): Payer: BC Managed Care – PPO

## 2014-05-06 DIAGNOSIS — Z0000 Encounter for general adult medical examination without abnormal findings: Secondary | ICD-10-CM

## 2014-05-06 DIAGNOSIS — Z1212 Encounter for screening for malignant neoplasm of rectum: Secondary | ICD-10-CM

## 2014-05-06 LAB — POC HEMOCCULT BLD/STL (HOME/3-CARD/SCREEN)
Card #3 Fecal Occult Blood, POC: NEGATIVE
FECAL OCCULT BLD: NEGATIVE
Fecal Occult Blood, POC: NEGATIVE

## 2014-05-09 ENCOUNTER — Ambulatory Visit (INDEPENDENT_AMBULATORY_CARE_PROVIDER_SITE_OTHER): Payer: BC Managed Care – PPO | Admitting: Physician Assistant

## 2014-05-09 ENCOUNTER — Encounter: Payer: Self-pay | Admitting: Physician Assistant

## 2014-05-09 VITALS — BP 110/78 | HR 60 | Temp 97.9°F | Resp 16 | Wt 202.0 lb

## 2014-05-09 DIAGNOSIS — M545 Low back pain, unspecified: Secondary | ICD-10-CM

## 2014-05-09 DIAGNOSIS — N3000 Acute cystitis without hematuria: Secondary | ICD-10-CM

## 2014-05-09 LAB — CBC WITH DIFFERENTIAL/PLATELET
Basophils Absolute: 0.1 10*3/uL (ref 0.0–0.1)
Basophils Relative: 1 % (ref 0–1)
Eosinophils Absolute: 0.2 10*3/uL (ref 0.0–0.7)
Eosinophils Relative: 3 % (ref 0–5)
HCT: 42.9 % (ref 36.0–46.0)
Hemoglobin: 14.4 g/dL (ref 12.0–15.0)
LYMPHS ABS: 1.7 10*3/uL (ref 0.7–4.0)
LYMPHS PCT: 32 % (ref 12–46)
MCH: 30.1 pg (ref 26.0–34.0)
MCHC: 33.6 g/dL (ref 30.0–36.0)
MCV: 89.7 fL (ref 78.0–100.0)
Monocytes Absolute: 0.6 10*3/uL (ref 0.1–1.0)
Monocytes Relative: 12 % (ref 3–12)
NEUTROS ABS: 2.7 10*3/uL (ref 1.7–7.7)
NEUTROS PCT: 52 % (ref 43–77)
PLATELETS: 279 10*3/uL (ref 150–400)
RBC: 4.78 MIL/uL (ref 3.87–5.11)
RDW: 14.8 % (ref 11.5–15.5)
WBC: 5.2 10*3/uL (ref 4.0–10.5)

## 2014-05-09 LAB — BASIC METABOLIC PANEL WITH GFR
BUN: 10 mg/dL (ref 6–23)
CHLORIDE: 101 meq/L (ref 96–112)
CO2: 28 meq/L (ref 19–32)
Calcium: 9.7 mg/dL (ref 8.4–10.5)
Creat: 0.75 mg/dL (ref 0.50–1.10)
GFR, Est African American: >89 mL/min
GFR, Est Non African American: 88 mL/min
Glucose, Bld: 79 mg/dL (ref 70–99)
POTASSIUM: 4.4 meq/L (ref 3.5–5.3)
SODIUM: 138 meq/L (ref 135–145)

## 2014-05-09 NOTE — Patient Instructions (Signed)
Urinary Tract Infection A urinary tract infection (UTI) can occur any place along the urinary tract. The tract includes the kidneys, ureters, bladder, and urethra. A type of germ called bacteria often causes a UTI. UTIs are often helped with antibiotic medicine.  HOME CARE   If given, take antibiotics as told by your doctor. Finish them even if you start to feel better.  Drink enough fluids to keep your pee (urine) clear or pale yellow.  Avoid tea, drinks with caffeine, and bubbly (carbonated) drinks.  Pee often. Avoid holding your pee in for a long time.  Pee before and after having sex (intercourse).  Wipe from front to back after you poop (bowel movement) if you are a woman. Use each tissue only once. GET HELP RIGHT AWAY IF:   You have back pain.  You have lower belly (abdominal) pain.  You have chills.  You feel sick to your stomach (nauseous).  You throw up (vomit).  Your burning or discomfort with peeing does not go away.  You have a fever.  Your symptoms are not better in 3 days. MAKE SURE YOU:   Understand these instructions.  Will watch your condition.  Will get help right away if you are not doing well or get worse. Document Released: 03/22/2008 Document Revised: 06/28/2012 Document Reviewed: 05/04/2012 [facility] Patient Information 2015 [place], [place]. This information is not intended to replace advice given to you by your health care provider. Make sure you discuss any questions you have with your health care provider.   Back Exercises Back exercises help treat and prevent back injuries. The goal of back exercises is to increase the strength of your abdominal and back muscles and the flexibility of your back. These exercises should be started when you no longer have back pain. Back exercises include:  Pelvic Tilt. Lie on your back with your knees bent. Tilt your pelvis until the lower part of your back is against the floor. Hold this position 5 to 10 sec and  repeat 5 to 10 times.  Knee to Chest. Pull first 1 knee up against your chest and hold for 20 to 30 seconds, repeat this with the other knee, and then both knees. This may be done with the other leg straight or bent, whichever feels better.  Sit-Ups or Curl-Ups. Bend your knees 90 degrees. Start with tilting your pelvis, and do a partial, slow sit-up, lifting your trunk only 30 to 45 degrees off the floor. Take at least 2 to 3 seconds for each sit-up. Do not do sit-ups with your knees out straight. If partial sit-ups are difficult, simply do the above but with only tightening your abdominal muscles and holding it as directed.  Hip-Lift. Lie on your back with your knees flexed 90 degrees. Push down with your feet and shoulders as you raise your hips a couple inches off the floor; hold for 10 seconds, repeat 5 to 10 times.  Back arches. Lie on your stomach, propping yourself up on bent elbows. Slowly press on your hands, causing an arch in your low back. Repeat 3 to 5 times. Any initial stiffness and discomfort should lessen with repetition over time.  Shoulder-Lifts. Lie face down with arms beside your body. Keep hips and torso pressed to floor as you slowly lift your head and shoulders off the floor. Do not overdo your exercises, especially in the beginning. Exercises may cause you some mild back discomfort which lasts for a few minutes; however, if the pain is more  severe, or lasts for more than 15 minutes, do not continue exercises until you see your caregiver. Improvement with exercise therapy for back problems is slow.  See your caregivers for assistance with developing a proper back exercise program. Document Released: 11/11/2004 Document Revised: 12/27/2011 Document Reviewed: 08/05/2011 [facility] Patient Information 2015 [place], [place]. This information is not intended to replace advice given to you by your health care provider. Make sure you discuss any questions you have with your health care  provider.

## 2014-05-09 NOTE — Progress Notes (Signed)
   Subjective:    Patient ID: [name], female    DOB: Dec 07, 1955, 58 y.o.   MRN: [id number]  HPI 58 y.o. female with history of kidney stones presents with lower back pain since Monday. She has been treated with UTI with ABX, has one day left.  It is intermittent back pain, better with lying down, worse with moving, some urgency/hesitancy.  Denies numbness tingling pain down legs, fever, chills, nausea.     Review of Systems  Constitutional: Negative for fever, chills and fatigue.  HENT: Negative.   Respiratory: Negative.   Cardiovascular: Negative.   Gastrointestinal: Positive for abdominal pain (lower AB pain).  Genitourinary: Positive for dysuria.  Musculoskeletal: Positive for back pain.  Skin: Negative.   Neurological: Negative.        Objective:   Physical Exam  Cardiovascular: Normal rate and regular rhythm.   Pulmonary/Chest: Effort normal and breath sounds normal.  Abdominal: Soft. Bowel sounds are normal. She exhibits no distension. There is tenderness (diffuse lower abdomen). There is no rebound and no guarding.  Musculoskeletal:  Patient is able to ambulate well.  Gait is not  antalgic. Straight leg raising negative bilaterally for radicular symptoms. Sensory exam in the legs is normal.  Knee reflexes are normal Ankle reflexes are normal Strength is normal and symmetric. There isparaspinal muscle spasm.  There is not midline tenderness.  ROM of spine with normal flexion, extension, lateral range of motion to the right and left, and rotation to the right and left. She does have lower back pain with changing positions.        Assessment & Plan:  Lower back pain- appears to be mechanical - possible kidney stone- finish ABX, take tyelnol (refuses other pain meds), check CBC, BMP, UA C&S- if worse go to the ER, if not better will get CT AB

## 2014-05-10 LAB — URINALYSIS, MICROSCOPIC ONLY
BACTERIA UA: NONE SEEN
CASTS: NONE SEEN
Crystals: NONE SEEN
Squamous Epithelial / LPF: NONE SEEN

## 2014-05-10 LAB — URINALYSIS, ROUTINE W REFLEX MICROSCOPIC
BILIRUBIN URINE: NEGATIVE
Glucose, UA: NEGATIVE mg/dL
HGB URINE DIPSTICK: NEGATIVE
KETONES UR: NEGATIVE mg/dL
Nitrite: NEGATIVE
PROTEIN: NEGATIVE mg/dL
Specific Gravity, Urine: 1.008 (ref 1.005–1.030)
Urobilinogen, UA: 0.2 mg/dL (ref 0.0–1.0)
pH: 6.5 (ref 5.0–8.0)

## 2014-05-10 LAB — URINE CULTURE
Colony Count: NO GROWTH
Organism ID, Bacteria: NO GROWTH

## 2014-05-31 ENCOUNTER — Ambulatory Visit: Payer: Self-pay

## 2014-06-03 ENCOUNTER — Other Ambulatory Visit: Payer: BC Managed Care – PPO

## 2014-06-03 ENCOUNTER — Ambulatory Visit: Payer: BC Managed Care – PPO

## 2014-06-06 ENCOUNTER — Other Ambulatory Visit: Payer: BC Managed Care – PPO

## 2014-06-06 ENCOUNTER — Ambulatory Visit: Payer: BC Managed Care – PPO

## 2014-06-11 ENCOUNTER — Other Ambulatory Visit: Payer: Self-pay | Admitting: Physician Assistant

## 2014-06-17 ENCOUNTER — Other Ambulatory Visit: Payer: BC Managed Care – PPO

## 2014-06-17 ENCOUNTER — Ambulatory Visit: Payer: BC Managed Care – PPO

## 2014-06-17 ENCOUNTER — Other Ambulatory Visit: Payer: Self-pay | Admitting: *Deleted

## 2014-06-17 MED ORDER — DILTIAZEM HCL ER BEADS 120 MG PO CP24: 120.0000 mg | ORAL_CAPSULE | Freq: Every day | ORAL | Status: DC

## 2014-07-19 ENCOUNTER — Ambulatory Visit
Admission: RE | Admit: 2014-07-19 | Discharge: 2014-07-19 | Disposition: A | Discharge status: Home / Self Care | Payer: BC Managed Care – PPO | Source: Ambulatory Visit | Attending: Emergency Medicine | Admitting: Emergency Medicine

## 2014-07-19 DIAGNOSIS — M810 Age-related osteoporosis without current pathological fracture: Secondary | ICD-10-CM

## 2014-07-19 DIAGNOSIS — Z1231 Encounter for screening mammogram for malignant neoplasm of breast: Secondary | ICD-10-CM

## 2014-07-30 ENCOUNTER — Other Ambulatory Visit: Payer: Self-pay | Admitting: *Deleted

## 2014-07-30 MED ORDER — POTASSIUM CHLORIDE CRYS ER 20 MEQ PO TBCR: EXTENDED_RELEASE_TABLET | ORAL | Status: DC

## 2014-07-31 ENCOUNTER — Telehealth: Payer: Self-pay | Admitting: *Deleted

## 2014-07-31 NOTE — Telephone Encounter (Signed)
patient aware of normal BMD result.

## 2014-08-12 ENCOUNTER — Encounter (HOSPITAL_COMMUNITY): Payer: Self-pay | Admitting: Emergency Medicine

## 2014-08-12 ENCOUNTER — Emergency Department (HOSPITAL_COMMUNITY)
Admission: EM | Admit: 2014-08-12 | Discharge: 2014-08-12 | Disposition: A | Discharge status: Home / Self Care | Payer: BC Managed Care – PPO | Attending: Emergency Medicine | Admitting: Emergency Medicine

## 2014-08-12 ENCOUNTER — Emergency Department (HOSPITAL_COMMUNITY): Payer: BC Managed Care – PPO

## 2014-08-12 DIAGNOSIS — M6281 Muscle weakness (generalized): Secondary | ICD-10-CM | POA: Diagnosis present

## 2014-08-12 DIAGNOSIS — N390 Urinary tract infection, site not specified: Secondary | ICD-10-CM | POA: Insufficient documentation

## 2014-08-12 DIAGNOSIS — Z8614 Personal history of Methicillin resistant Staphylococcus aureus infection: Secondary | ICD-10-CM | POA: Insufficient documentation

## 2014-08-12 DIAGNOSIS — Z880 Allergy status to penicillin: Secondary | ICD-10-CM | POA: Insufficient documentation

## 2014-08-12 DIAGNOSIS — Z7982 Long term (current) use of aspirin: Secondary | ICD-10-CM | POA: Diagnosis not present

## 2014-08-12 DIAGNOSIS — Z79899 Other long term (current) drug therapy: Secondary | ICD-10-CM | POA: Insufficient documentation

## 2014-08-12 DIAGNOSIS — E780 Pure hypercholesterolemia: Secondary | ICD-10-CM | POA: Diagnosis not present

## 2014-08-12 DIAGNOSIS — Z872 Personal history of diseases of the skin and subcutaneous tissue: Secondary | ICD-10-CM | POA: Diagnosis not present

## 2014-08-12 DIAGNOSIS — R0602 Shortness of breath: Secondary | ICD-10-CM

## 2014-08-12 DIAGNOSIS — Z87442 Personal history of urinary calculi: Secondary | ICD-10-CM | POA: Insufficient documentation

## 2014-08-12 DIAGNOSIS — E559 Vitamin D deficiency, unspecified: Secondary | ICD-10-CM | POA: Insufficient documentation

## 2014-08-12 DIAGNOSIS — I10 Essential (primary) hypertension: Secondary | ICD-10-CM | POA: Insufficient documentation

## 2014-08-12 DIAGNOSIS — D649 Anemia, unspecified: Secondary | ICD-10-CM | POA: Diagnosis not present

## 2014-08-12 DIAGNOSIS — R531 Weakness: Secondary | ICD-10-CM

## 2014-08-12 LAB — COMPREHENSIVE METABOLIC PANEL
ALT: 17 U/L (ref 0–35)
AST: 31 U/L (ref 0–37)
Albumin: 4 g/dL (ref 3.5–5.2)
Alkaline Phosphatase: 88 U/L (ref 39–117)
Anion gap: 11 (ref 5–15)
BUN: 7 mg/dL (ref 6–23)
CHLORIDE: 105 meq/L (ref 96–112)
CO2: 27 meq/L (ref 19–32)
Calcium: 9.6 mg/dL (ref 8.4–10.5)
Creatinine, Ser: 0.84 mg/dL (ref 0.50–1.10)
GFR calc Af Amer: 86 mL/min — ABNORMAL LOW (ref >90)
GFR, EST NON AFRICAN AMERICAN: 75 mL/min — AB (ref >90)
GLUCOSE: 71 mg/dL (ref 70–99)
Potassium: 6 mEq/L — ABNORMAL HIGH (ref 3.7–5.3)
SODIUM: 143 meq/L (ref 137–147)
TOTAL PROTEIN: 7.3 g/dL (ref 6.0–8.3)
Total Bilirubin: 0.4 mg/dL (ref 0.3–1.2)

## 2014-08-12 LAB — URINALYSIS, ROUTINE W REFLEX MICROSCOPIC
BILIRUBIN URINE: NEGATIVE
Glucose, UA: NEGATIVE mg/dL
Ketones, ur: NEGATIVE mg/dL
Nitrite: NEGATIVE
Protein, ur: NEGATIVE mg/dL
SPECIFIC GRAVITY, URINE: 1.016 (ref 1.005–1.030)
Urobilinogen, UA: 0.2 mg/dL (ref 0.0–1.0)
pH: 7 (ref 5.0–8.0)

## 2014-08-12 LAB — CBC
HEMATOCRIT: 43.4 % (ref 36.0–46.0)
HEMOGLOBIN: 14.1 g/dL (ref 12.0–15.0)
MCH: 30.5 pg (ref 26.0–34.0)
MCHC: 32.5 g/dL (ref 30.0–36.0)
MCV: 93.9 fL (ref 78.0–100.0)
Platelets: 152 10*3/uL (ref 150–400)
RBC: 4.62 MIL/uL (ref 3.87–5.11)
RDW: 14.4 % (ref 11.5–15.5)
WBC: 4.2 10*3/uL (ref 4.0–10.5)

## 2014-08-12 LAB — POTASSIUM: Potassium: 3.7 mEq/L (ref 3.7–5.3)

## 2014-08-12 LAB — URINE MICROSCOPIC-ADD ON

## 2014-08-12 MED ORDER — NITROFURANTOIN MONOHYD MACRO 100 MG PO CAPS: 100.0000 mg | ORAL_CAPSULE | Freq: Two times a day (BID) | ORAL | Status: DC

## 2014-08-12 MED ORDER — NITROFURANTOIN MONOHYD MACRO 100 MG PO CAPS
100.0000 mg | ORAL_CAPSULE | Freq: Once | ORAL | Status: AC
  Administered 2014-08-12: 100 mg via ORAL
  Filled 2014-08-12: qty 1

## 2014-08-12 NOTE — Discharge Instructions (Signed)
Return to the ED with any concerns including fever/chills, vomiting and not able to keep down liquids or antibiotics, fainting, chest pain, difficulty breathing, decreased level of alertness/lethargy, or any other alarming symptoms

## 2014-08-12 NOTE — ED Notes (Signed)
Patient presents to ed c/o being numb all over x 3 months states she called he MD and was told to come to the emergency room. States she thinks she may also have a UTI "because she is talking to much and went she talks a lot that is usually what is wrong. "

## 2014-08-12 NOTE — ED Notes (Signed)
Pt c/o entire body numbness, dizziness and leg heaviness x 3 weeks.  She came in today b/c she couldn't handle her legs being so heavy anymore.

## 2014-08-12 NOTE — ED Notes (Signed)
Patient could not get a urine at this time will try later

## 2014-08-12 NOTE — ED Notes (Signed)
Pt ambulated to the restroom without difficulty.  Urine sample will be collected.

## 2014-08-12 NOTE — ED Provider Notes (Signed)
CSN: [id number]     Arrival date & time 08/12/14  5956 History   First MD Initiated Contact with Patient 08/12/14 1036     Chief Complaint  Patient presents with  . Dizziness  . Numbness     (Consider location/radiation/quality/duration/timing/severity/associated sxs/prior Treatment) HPI Pt presenting with c/o not feeling well for the past 3 weeks.  Pt is a poor historian and repeats that she cannot explain her symptoms.  She states she has been feeling tired and having generalized weakness for the past 3 weeks.  She has continued working. She states last night her legs felt heavy and felt like she might fall.  She endorses feeling some intermittent lightheadedness with standing, no vertigo.  States she has been drinking water.  No vomiting or diarrhea.  No fever/chills.  States she has felt similarly with prior UTI.  No chest pain.  States she at times feels sob with walking and with lying flat.  Denies pain.  No syncope.  There are no other associated systemic symptoms, there are no other alleviating or modifying factors.   Past Medical History  Diagnosis Date  . MRSA (methicillin resistant staph aureus) culture positive   . Eczema   . Hypercholesteremia   . Hypertension   . Kidney stones   . Anemia   . Vitamin D deficiency   . A-fib    Past Surgical History  Procedure Laterality Date  . Breast surgery Left     biopsy-neg   Family History  Problem Relation Age of Onset  . Hypertension Mother   . Hepatitis C Mother   . Heart disease Father   . Dementia Father    History  Substance Use Topics  . Smoking status: Never Smoker   . Smokeless tobacco: Not on file  . Alcohol Use: No   OB History   Grav Para Term Preterm Abortions TAB SAB Ect Mult Living                 Review of Systems ROS reviewed and all otherwise negative except for mentioned in HPI    Allergies  Penicillins  Home Medications   Prior to Admission medications   Medication Sig Start Date End  Date Taking? Authorizing Provider  ascorbic acid (VITAMIN C) 1000 MG tablet Take 1,000 mg by mouth daily.   Yes Historical Provider, MD  aspirin EC 81 MG tablet Take 81 mg by mouth daily.   Yes Historical Provider, MD  atenolol (TENORMIN) 100 MG tablet Take 50 mg by mouth daily.   Yes Historical Provider, MD  atorvastatin (LIPITOR) 80 MG tablet Take 26.67 mg by mouth See admin instructions. Take 1/3 tablet by mouth three times a week on Tuesday, thursday, and saturday   Yes Historical Provider, MD  Cholecalciferol (VITAMIN D PO) Take 4,000 Units by mouth daily.    Yes Historical Provider, MD  diltiazem (TIAZAC) 120 MG 24 hr capsule Take 1 capsule (120 mg total) by mouth daily. 06/17/14  Yes Unk Pinto, MD  doxepin (SINEQUAN) 75 MG capsule Take 75 mg by mouth at bedtime.   Yes Historical Provider, MD  Iron Combinations (IRON COMPLEX PO) Take 1 tablet by mouth daily.   Yes Historical Provider, MD  Olopatadine HCl (PATADAY) 0.2 % SOLN Apply 1 drop to eye daily.   Yes Historical Provider, MD  potassium chloride SA (K-DUR,KLOR-CON) 20 MEQ tablet Take 20 mEq by mouth 2 (two) times daily.   Yes Historical Provider, MD  triamterene-hydrochlorothiazide (MAXZIDE-25) 37.5-25 MG per  tablet Take 1 tablet by mouth daily.   Yes Historical Provider, MD  nitrofurantoin, macrocrystal-monohydrate, (MACROBID) 100 MG capsule Take 1 capsule (100 mg total) by mouth 2 (two) times daily. 08/12/14   Threasa Beards, MD   BP 129/62  Pulse 73  Temp(Src) 97.5 F (36.4 C) (Oral)  Resp 16  SpO2 100% Vitals reviewed Physical Exam Physical Examination: General appearance - alert, well appearing, and in no distress Mental status - alert, oriented to person, place, and time Eyes - pupils equal and reactive, extraocular eye movements intact Mouth - mucous membranes moist, pharynx normal without lesions Chest - clear to auscultation, no wheezes, rales or rhonchi, symmetric air entry Heart - normal rate, regular rhythm,  normal S1, S2, no murmurs, rubs, clicks or gallops Abdomen - soft, nontender, nondistended, no masses or organomegaly Neurological - alert, oriented x 3, cranial nerves 2-12 tested and intact, strength 5/5 in extremities x 4, sensation intact Extremities - peripheral pulses normal, no pedal edema, no clubbing or cyanosis Skin - normal coloration and turgor, no rashes  ED Course  Procedures (including critical care time) Labs Review Labs Reviewed  COMPREHENSIVE METABOLIC PANEL - Abnormal; Notable for the following:    Potassium 6.0 (*)    GFR calc non Af Amer 75 (*)    GFR calc Af Amer 86 (*)    All other components within normal limits  URINALYSIS, ROUTINE W REFLEX MICROSCOPIC - Abnormal; Notable for the following:    Hgb urine dipstick SMALL (*)    Leukocytes, UA MODERATE (*)    All other components within normal limits  URINE MICROSCOPIC-ADD ON - Abnormal; Notable for the following:    Bacteria, UA FEW (*)    All other components within normal limits  URINE CULTURE  CBC  POTASSIUM    Imaging Review Dg Chest 2 View  08/12/2014   CLINICAL DATA:  Shortness of breath for 3 weeks, initial evaluation  EXAM: CHEST  2 VIEW  COMPARISON:  10/27/2011  FINDINGS: Heart size upper normal and stable. Mild pulmonary venous congestion. No pulmonary edema consolidation or effusion. Similar prominence of the right hilum when compared to prior study, likely due to overlapping vascular structures.  IMPRESSION: No active cardiopulmonary disease.   Electronically Signed   By: Skipper Cliche M.D.   On: 08/12/2014 11:56     EKG Interpretation   Date/Time:  Monday August 12 2014 10:08:48 EDT Ventricular Rate:  71 PR Interval:  176 QRS Duration: 72 QT Interval:  398 QTC Calculation: 432 R Axis:   66 Text Interpretation:  Atrial flutter with variable A-V block Abnormal ECG  ED PHYSICIAN INTERPRETATION AVAILABLE IN [facility] Confirmed by  TEST, Record ([id number]) on 08/14/2014 7:46:48 AM       MDM   Final diagnoses:  Shortness of breath  UTI (lower urinary tract infection)  Generalized weakness    Pt presenting with multiple vague complaints- states she just feels tired and weak and not like her self.  No focal weakness or numbness.  After much discussion pt does not mean numb when she says that, she mean weak and fatigued.  Sob seems due to her weakness and fatigue.  UTI found on workup- she states these sympotms are similar to a prior UTI she had.  Pt started on antibiotics and is stable for outpatient treatment.  Discharged with strict return precautions.  Pt agreeable with plan.   Threasa Beards, MD 08/14/14 [id number]

## 2014-08-14 LAB — URINE CULTURE: Colony Count: 45000

## 2014-08-16 ENCOUNTER — Ambulatory Visit: Payer: Self-pay | Admitting: Physician Assistant

## 2014-08-20 ENCOUNTER — Ambulatory Visit (INDEPENDENT_AMBULATORY_CARE_PROVIDER_SITE_OTHER): Payer: BC Managed Care – PPO | Admitting: Physician Assistant

## 2014-08-20 VITALS — BP 120/80 | HR 68 | Temp 97.9°F | Resp 16 | Ht 61.5 in | Wt 200.0 lb

## 2014-08-20 DIAGNOSIS — E559 Vitamin D deficiency, unspecified: Secondary | ICD-10-CM

## 2014-08-20 DIAGNOSIS — E669 Obesity, unspecified: Secondary | ICD-10-CM

## 2014-08-20 DIAGNOSIS — R7309 Other abnormal glucose: Secondary | ICD-10-CM

## 2014-08-20 DIAGNOSIS — Z79899 Other long term (current) drug therapy: Secondary | ICD-10-CM

## 2014-08-20 DIAGNOSIS — I480 Paroxysmal atrial fibrillation: Secondary | ICD-10-CM

## 2014-08-20 DIAGNOSIS — I10 Essential (primary) hypertension: Secondary | ICD-10-CM

## 2014-08-20 DIAGNOSIS — N3000 Acute cystitis without hematuria: Secondary | ICD-10-CM

## 2014-08-20 DIAGNOSIS — E782 Mixed hyperlipidemia: Secondary | ICD-10-CM

## 2014-08-20 DIAGNOSIS — R7303 Prediabetes: Secondary | ICD-10-CM

## 2014-08-20 LAB — CBC WITH DIFFERENTIAL/PLATELET
Basophils Absolute: 0.1 10*3/uL (ref 0.0–0.1)
Basophils Relative: 1 % (ref 0–1)
EOS PCT: 5 % (ref 0–5)
Eosinophils Absolute: 0.3 10*3/uL (ref 0.0–0.7)
HCT: 40.7 % (ref 36.0–46.0)
Hemoglobin: 13.7 g/dL (ref 12.0–15.0)
LYMPHS ABS: 1.5 10*3/uL (ref 0.7–4.0)
LYMPHS PCT: 29 % (ref 12–46)
MCH: 30.2 pg (ref 26.0–34.0)
MCHC: 33.7 g/dL (ref 30.0–36.0)
MCV: 89.6 fL (ref 78.0–100.0)
MONO ABS: 0.5 10*3/uL (ref 0.1–1.0)
Monocytes Relative: 10 % (ref 3–12)
Neutro Abs: 2.8 10*3/uL (ref 1.7–7.7)
Neutrophils Relative %: 55 % (ref 43–77)
Platelets: 311 10*3/uL (ref 150–400)
RBC: 4.54 MIL/uL (ref 3.87–5.11)
RDW: 14.7 % (ref 11.5–15.5)
WBC: 5 10*3/uL (ref 4.0–10.5)

## 2014-08-20 LAB — HEMOGLOBIN A1C
Hgb A1c MFr Bld: 5.6 % (ref <5.7)
Mean Plasma Glucose: 114 mg/dL (ref <117)

## 2014-08-20 NOTE — Progress Notes (Addendum)
Assessment and Plan:  Hypertension: Continue medication, monitor blood pressure at home. Continue DASH diet.  Reminder to go to the ER if any CP, SOB, nausea, dizziness, severe HA, changes vision/speech, left arm numbness and tingling, and jaw pain. Cholesterol: Continue diet and exercise. Check cholesterol.  Pre-diabetes-Continue diet and exercise. Check A1C Vitamin D Def- check level and continue medications.  Obesity with co morbidities- long discussion about weight loss, diet, and exercise Afib- will refer to cardio, continue bASA and diltiazem for now, ? Sleep apnea to contribute but patient declines study at this time. Denies CP, SOB, dizziness, weakness at this time.  Recheck urine  CHA2DS2VASc Score: Score of 0 is very low risk, score of 1 is intermediate risk rate of 0.6% at 1 year, score greater than 1 is high risk rate of 3% rate at 1 year.   CHA2DS2-VASc Risk Criteria Score  CHF 1 point  HTN 1 point  Age <65 0 points  Age 82-74 1 point  Age >/= 56 2 points  Diabetes 1 point  Stroke/TIA/Thromboembolism History 2 points  Vascular Disease History/PAD 1 point  Female or Female Female=0 points   Female=1 point   Your Score is __2___.     Continue diet and meds as discussed. Further disposition pending results of labs.  HPI 58 y.o. female  presents for 3 month follow up with hypertension, hyperlipidemia, prediabetes and vitamin D. Her blood pressure has been controlled at home, today their BP is BP: 120/80 mmHg She does workout, walks. She denies chest pain, shortness of breath, dizziness.  She is on cholesterol medication, she is on lipitor 80mg  1/2 tab 3 days a week and denies myalgias. Her cholesterol is at goal. The cholesterol last visit was:   Lab Results  Component Value Date   CHOL 200 04/25/2014   HDL 79 04/25/2014   LDLCALC 111* 04/25/2014   TRIG 49 04/25/2014   CHOLHDL 2.5 04/25/2014   She has been working on diet and exercise for prediabetes, her weight is down  4 lbs total, and denies paresthesia of the feet, polydipsia, polyuria and visual disturbances. Last A1C in the office was:  Lab Results  Component Value Date   HGBA1C 5.5 04/25/2014   Patient is on Vitamin D supplement.   Lab Results  Component Value Date   VD25OH 61 04/25/2014     Last visit she had lower back pain and UTI symptoms, treated with Macrobid. She then went to the ER 10/26 with similar symptoms, put on ABX.  Denies any UA symptoms today.  While at that visit in the ER she had possible atrial flutter, and she has had one other episode of atrial fib with RVR in the office back in 2013. She was suppose to follow up with cardiology but never did. She denies CV symptoms, she is on diltiazem and bASA. CHADS score is 1 but CHADSVASC is 2.  BMI is Body mass index is 37.18 kg/(m^2)., she is working on diet and exercise and has done well.  Wt Readings from Last 3 Encounters:  08/20/14 200 lb (90.719 kg)  05/09/14 202 lb (91.627 kg)  04/25/14 204 lb (92.534 kg)    She is on Diltiazem and bASA for questionable Afib, will review previous charts and decide is she would benefit from seeing Cardio. She declines at this time.   Current Medications:  Current Outpatient Prescriptions on File Prior to Visit  Medication Sig Dispense Refill  . ascorbic acid (VITAMIN C) 1000 MG tablet Take  1,000 mg by mouth daily.    Marland Kitchen aspirin EC 81 MG tablet Take 81 mg by mouth daily.    Marland Kitchen atenolol (TENORMIN) 100 MG tablet Take 50 mg by mouth daily.    Marland Kitchen atorvastatin (LIPITOR) 80 MG tablet Take 26.67 mg by mouth See admin instructions. Take 1/3 tablet by mouth three times a week on Tuesday, thursday, and saturday    . Cholecalciferol (VITAMIN D PO) Take 4,000 Units by mouth daily.     Marland Kitchen diltiazem (TIAZAC) 120 MG 24 hr capsule Take 1 capsule (120 mg total) by mouth daily. 90 capsule 2  . doxepin (SINEQUAN) 75 MG capsule Take 75 mg by mouth at bedtime.    . Iron Combinations (IRON COMPLEX PO) Take 1 tablet by  mouth daily.    . nitrofurantoin, macrocrystal-monohydrate, (MACROBID) 100 MG capsule Take 1 capsule (100 mg total) by mouth 2 (two) times daily. 14 capsule 0  . Olopatadine HCl (PATADAY) 0.2 % SOLN Apply 1 drop to eye daily.    . potassium chloride SA (K-DUR,KLOR-CON) 20 MEQ tablet Take 20 mEq by mouth 2 (two) times daily.    Marland Kitchen triamterene-hydrochlorothiazide (MAXZIDE-25) 37.5-25 MG per tablet Take 1 tablet by mouth daily.     No current facility-administered medications on file prior to visit.   Medical History:  Past Medical History  Diagnosis Date  . MRSA (methicillin resistant staph aureus) culture positive   . Eczema   . Hypercholesteremia   . Hypertension   . Kidney stones   . Anemia   . Vitamin D deficiency   . A-fib    Allergies:  Allergies  Allergen Reactions  . Penicillins Other (See Comments)    DOESN'T REMEMBER     Review of Systems: [X]  = complains of  [ ]  = denies  General: Fatigue [ ]  Fever [ ]  Chills [ ]  Weakness [ ]   Insomnia [ ]  Eyes: Redness [ ]  Blurred vision [ ]  Diplopia [ ]   ENT: Congestion [ ]  Sinus Pain [ ]  Post Nasal Drip [ ]  Sore Throat [ ]  Earache [ ]   Cardiac: Chest pain/pressure [ ]  SOB [ ]  Orthopnea [ ]   Palpitations [ ]   Paroxysmal nocturnal dyspnea[ ]  Claudication [ ]  Edema [ ]   Pulmonary: Cough [ ]  Wheezing[ ]   SOB [ ]   Snoring [ ]   GI: Nausea [ ]  Vomiting[ ]  Dysphagia[ ]  Heartburn[ ]  Abdominal pain [ ]  Constipation [ ] ; Diarrhea [ ] ; BRBPR [ ]  Melena[ ]  GU: Hematuria[ ]  Dysuria [ ]  Nocturia[ ]  Urgency [ ]   Hesitancy [ ]  Discharge [ ]  Neuro: Headaches[ ]  Vertigo[ ]  Paresthesias[ ]  Spasm [ ]  Speech changes [ ]  Incoordination [ ]   Ortho: Arthritis [ ]  Joint pain [ ]  Muscle pain [ ]  Joint swelling [ ]  Back Pain [ ]  Skin:  Rash [ ]   Pruritis [ ]  Change in skin lesion [ ]   Psych: Depression[ ]  Anxiety[ ]  Confusion [ ]  Memory loss [ ]   Heme/Lypmh: Bleeding [ ]  Bruising [ ]  Enlarged lymph nodes [ ]   Endocrine: Visual blurring [ ]  Paresthesia [ ]   Polyuria [ ]  Polydypsea [ ]    Heat/cold intolerance [ ]  Hypoglycemia [ ]   Family history- Review and unchanged Social history- Review and unchanged Physical Exam: BP 120/80 mmHg  Pulse 68  Temp(Src) 97.9 F (36.6 C)  Resp 16  Ht 5' 1.5" (1.562 m)  Wt 200 lb (90.719 kg)  BMI 37.18 kg/m2 Wt Readings from Last 3 Encounters:  08/20/14 200 lb (90.719 kg)  05/09/14 202 lb (91.627 kg)  04/25/14 204 lb (92.534 kg)   General Appearance: Well nourished, in no apparent distress. Eyes: PERRLA, EOMs, conjunctiva no swelling or erythema Sinuses: No Frontal/maxillary tenderness ENT/Mouth: Ext aud canals clear, TMs without erythema, bulging. No erythema, swelling, or exudate on post pharynx.  Tonsils not swollen or erythematous. Hearing normal.  Neck: Supple, thyroid normal.  Respiratory: Respiratory effort normal, BS equal bilaterally without rales, rhonchi, wheezing or stridor.  Cardio: RRR with no MRGs. Brisk peripheral pulses without edema.  Abdomen: Soft, + BS, obese Non tender, no guarding, rebound, hernias, masses. Lymphatics: Non tender without lymphadenopathy.  Musculoskeletal: Full ROM, 5/5 strength, normal gait.  Skin: Warm, dry without rashes, lesions, ecchymosis.  Neuro: Cranial nerves intact. Normal muscle tone, no cerebellar symptoms. Sensation intact.  Psych: Awake and oriented X 3, normal affect, Insight and Judgment appropriate.    Vicie Mutters, PA-C 11:05 AM [facility]

## 2014-08-20 NOTE — Addendum Note (Signed)
Addended by: Vicie Mutters R on: 08/20/2014 05:33 PM   Modules accepted: Orders

## 2014-08-20 NOTE — Patient Instructions (Signed)

## 2014-08-21 LAB — MAGNESIUM: Magnesium: 1.8 mg/dL (ref 1.5–2.5)

## 2014-08-21 LAB — LIPID PANEL
CHOL/HDL RATIO: 2.3 ratio
Cholesterol: 158 mg/dL (ref 0–200)
HDL: 69 mg/dL (ref >39)
LDL Cholesterol: 77 mg/dL (ref 0–99)
Triglycerides: 60 mg/dL (ref <150)
VLDL: 12 mg/dL (ref 0–40)

## 2014-08-21 LAB — URINALYSIS, ROUTINE W REFLEX MICROSCOPIC
Bilirubin Urine: NEGATIVE
GLUCOSE, UA: NEGATIVE mg/dL
KETONES UR: NEGATIVE mg/dL
Nitrite: NEGATIVE
Protein, ur: NEGATIVE mg/dL
Specific Gravity, Urine: 1.007 (ref 1.005–1.030)
UROBILINOGEN UA: 0.2 mg/dL (ref 0.0–1.0)
pH: 7 (ref 5.0–8.0)

## 2014-08-21 LAB — VITAMIN D 25 HYDROXY (VIT D DEFICIENCY, FRACTURES): VIT D 25 HYDROXY: 68 ng/mL (ref 30–89)

## 2014-08-21 LAB — HEPATIC FUNCTION PANEL
ALBUMIN: 4 g/dL (ref 3.5–5.2)
ALK PHOS: 83 U/L (ref 39–117)
ALT: 12 U/L (ref 0–35)
AST: 13 U/L (ref 0–37)
Bilirubin, Direct: 0.1 mg/dL (ref 0.0–0.3)
Indirect Bilirubin: 0.4 mg/dL (ref 0.2–1.2)
TOTAL PROTEIN: 6.7 g/dL (ref 6.0–8.3)
Total Bilirubin: 0.5 mg/dL (ref 0.2–1.2)

## 2014-08-21 LAB — BASIC METABOLIC PANEL WITH GFR
BUN: 10 mg/dL (ref 6–23)
CHLORIDE: 101 meq/L (ref 96–112)
CO2: 27 meq/L (ref 19–32)
CREATININE: 0.72 mg/dL (ref 0.50–1.10)
Calcium: 9.7 mg/dL (ref 8.4–10.5)
GFR, Est African American: >89 mL/min
GFR, Est Non African American: >89 mL/min
Glucose, Bld: 65 mg/dL — ABNORMAL LOW (ref 70–99)
Potassium: 4.4 mEq/L (ref 3.5–5.3)
Sodium: 140 mEq/L (ref 135–145)

## 2014-08-21 LAB — URINALYSIS, MICROSCOPIC ONLY
Bacteria, UA: NONE SEEN
CRYSTALS: NONE SEEN
Casts: NONE SEEN
SQUAMOUS EPITHELIAL / LPF: NONE SEEN

## 2014-08-21 LAB — INSULIN, FASTING: INSULIN FASTING, SERUM: 5.6 u[IU]/mL (ref 2.0–19.6)

## 2014-08-21 LAB — TSH: TSH: 1.118 u[IU]/mL (ref 0.350–4.500)

## 2014-08-22 LAB — URINE CULTURE: Colony Count: >=100000

## 2014-09-09 ENCOUNTER — Ambulatory Visit: Payer: Self-pay | Admitting: Emergency Medicine

## 2014-09-09 ENCOUNTER — Other Ambulatory Visit: Payer: BC Managed Care – PPO

## 2014-09-09 DIAGNOSIS — N390 Urinary tract infection, site not specified: Secondary | ICD-10-CM

## 2014-09-09 DIAGNOSIS — N3000 Acute cystitis without hematuria: Secondary | ICD-10-CM

## 2014-09-09 MED ORDER — CIPROFLOXACIN HCL 250 MG PO TABS: 250.0000 mg | ORAL_TABLET | Freq: Two times a day (BID) | ORAL | Status: DC

## 2014-09-10 LAB — URINALYSIS, COMPLETE
BILIRUBIN URINE: NEGATIVE
Bacteria, UA: NONE SEEN
Casts: NONE SEEN
Glucose, UA: NEGATIVE mg/dL
Hgb urine dipstick: NEGATIVE
Ketones, ur: NEGATIVE mg/dL
NITRITE: NEGATIVE
PROTEIN: NEGATIVE mg/dL
SQUAMOUS EPITHELIAL / LPF: NONE SEEN
Specific Gravity, Urine: 1.02 (ref 1.005–1.030)
UROBILINOGEN UA: 1 mg/dL (ref 0.0–1.0)
pH: 6.5 (ref 5.0–8.0)

## 2014-09-10 LAB — URINE CULTURE: Colony Count: >=100000

## 2014-09-20 ENCOUNTER — Ambulatory Visit (INDEPENDENT_AMBULATORY_CARE_PROVIDER_SITE_OTHER): Payer: BC Managed Care – PPO | Admitting: Cardiology

## 2014-09-20 ENCOUNTER — Encounter: Payer: Self-pay | Admitting: Cardiology

## 2014-09-20 VITALS — BP 126/80 | HR 70 | Ht 61.5 in | Wt 199.0 lb

## 2014-09-20 DIAGNOSIS — I480 Paroxysmal atrial fibrillation: Secondary | ICD-10-CM

## 2014-09-20 DIAGNOSIS — I10 Essential (primary) hypertension: Secondary | ICD-10-CM

## 2014-09-20 DIAGNOSIS — R0602 Shortness of breath: Secondary | ICD-10-CM

## 2014-09-20 NOTE — Progress Notes (Signed)
Patient ID: DALAL LIVENGOOD, female   DOB: October 21, 1955, 58 y.o.   MRN: [id number]     Patient Name: [name] Date of Encounter: 09/20/2014  Primary Care Provider:  Alesia Richards, MD Primary Cardiologist:  Dorothy Spark   Problem List   Past Medical History  Diagnosis Date  . MRSA (methicillin resistant staph aureus) culture positive   . Eczema   . Hypercholesteremia   . Hypertension   . Kidney stones   . Anemia   . Vitamin D deficiency   . A-fib    Past Surgical History  Procedure Laterality Date  . Breast surgery Left     biopsy-neg    Allergies  Allergies  Allergen Reactions  . Penicillins Other (See Comments)    DOESN'T REMEMBER    HPI   pleasant 58 year old female severe case of eczema , well-controlled hypertension hyperlipidemia was recently seen in the ER for fatigue and dyspnea on exertion.  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 No prior syncope.  She was diagnosed with atrial fibrillation with RVR that was symptomatic about a years ago. She was started on diltiazem and aspirin. No LE edema today, but at the end of the day. No orthopnea or PND. No palpitations since the last episode of a-fib.      Home Medications  Prior to Admission medications   Medication Sig Start Date End Date Taking? Authorizing Provider  ascorbic acid (VITAMIN C) 1000 MG tablet Take 1,000 mg by mouth daily.    Historical Provider, MD  aspirin EC 81 MG tablet Take 81 mg by mouth daily.    Historical Provider, MD  atenolol (TENORMIN) 100 MG tablet Take 50 mg by mouth daily.    Historical Provider, MD  atorvastatin (LIPITOR) 80 MG tablet Take 26.67 mg by mouth See admin instructions. Take 1/3 tablet by mouth three times a week on Tuesday, thursday, and saturday    Historical Provider, MD  Cholecalciferol (VITAMIN D PO) Take 4,000 Units by mouth daily.     Historical Provider, MD    ciprofloxacin (CIPRO) 250 MG tablet Take 1 tablet (250 mg total) by mouth 2 (two) times daily. 09/09/14   Melissa R Smith, PA-C  diltiazem (TIAZAC) 120 MG 24 hr capsule Take 1 capsule (120 mg total) by mouth daily. 06/17/14   Unk Pinto, MD  doxepin (SINEQUAN) 75 MG capsule Take 75 mg by mouth at bedtime.    Historical Provider, MD  Iron Combinations (IRON COMPLEX PO) Take 1 tablet by mouth daily.    Historical Provider, MD  nitrofurantoin, macrocrystal-monohydrate, (MACROBID) 100 MG capsule Take 1 capsule (100 mg total) by mouth 2 (two) times daily. 08/12/14   Threasa Beards, MD  Olopatadine HCl (PATADAY) 0.2 % SOLN Apply 1 drop to eye daily.    Historical Provider, MD  potassium chloride SA (K-DUR,KLOR-CON) 20 MEQ tablet Take 20 mEq by mouth 2 (two) times daily.    Historical Provider, MD  triamterene-hydrochlorothiazide (MAXZIDE-25) 37.5-25 MG per tablet Take 1 tablet by mouth daily.    Historical Provider, MD    Family History  Family History  Problem Relation Age of Onset  . Hypertension Mother   . Hepatitis C Mother   . Heart disease Father   . Dementia Father     Social History  History   Social History  . Marital Status: Widowed    Spouse Name: N/A  Number of Children: N/A  . Years of Education: N/A   Occupational History  . Not on file.   Social History Main Topics  . Smoking status: Never Smoker   . Smokeless tobacco: Not on file  . Alcohol Use: No  . Drug Use: No  . Sexual Activity: Not on file   Other Topics Concern  . Not on file   Social History Narrative  . No narrative on file     Review of Systems, as per HPI, otherwise negative General:  No chills, fever, night sweats or weight changes.  Cardiovascular:  No chest pain, dyspnea on exertion, edema, orthopnea, palpitations, paroxysmal nocturnal dyspnea. Dermatological: No rash, lesions/masses Respiratory: No cough, dyspnea Urologic: No hematuria, dysuria Abdominal:   No nausea, vomiting,  diarrhea, bright red blood per rectum, melena, or hematemesis Neurologic:  No visual changes, wkns, changes in mental status. All other systems reviewed and are otherwise negative except as noted above.  Physical Exam  128/80, HR 70, 199 lbs General: Pleasant, NAD Psych: Normal affect. Neuro: Alert and oriented X 3. Moves all extremities spontaneously. HEENT: Normal  Neck: Supple without bruits or JVD. Lungs:  Resp regular and unlabored, CTA. Heart: RRR no s3, s4, or murmurs. Abdomen: Soft, non-tender, non-distended, BS + x 4.  Extremities: No clubbing, cyanosis or edema. DP/PT/Radials 2+ and equal bilaterally.  Labs:  No results for input(s): CKTOTAL, CKMB, TROPONINI in the last 72 hours. Lab Results  Component Value Date   WBC 5.0 08/20/2014   HGB 13.7 08/20/2014   HCT 40.7 08/20/2014   MCV 89.6 08/20/2014   PLT 311 08/20/2014    No results found for: DDIMER Invalid input(s): POCBNP    Component Value Date/Time   NA 140 08/20/2014 1119   K 4.4 08/20/2014 1119   CL 101 08/20/2014 1119   CO2 27 08/20/2014 1119   GLUCOSE 65* 08/20/2014 1119   BUN 10 08/20/2014 1119   CREATININE 0.72 08/20/2014 1119   CREATININE 0.84 08/12/2014 1030   CALCIUM 9.7 08/20/2014 1119   PROT 6.7 08/20/2014 1119   ALBUMIN 4.0 08/20/2014 1119   AST 13 08/20/2014 1119   ALT 12 08/20/2014 1119   ALKPHOS 83 08/20/2014 1119   BILITOT 0.5 08/20/2014 1119   GFRNONAA >89 08/20/2014 1119   GFRNONAA 75* 08/12/2014 1030   GFRAA >89 08/20/2014 1119   GFRAA 86* 08/12/2014 1030   Lab Results  Component Value Date   CHOL 158 08/20/2014   HDL 69 08/20/2014   LDLCALC 77 08/20/2014   TRIG 60 08/20/2014    Accessory Clinical Findings  echocardiogram  ECG -  SINUS RHYTHM WITH SINUS ARRHYTHMIA, NONSPECIFIC st-t WAVE ABNORMALITIES, ABNORMAL ekg.    Assessment & Plan  58 year old female  1. DOE - risk factors include obesity, HTN, HLP, we will order an exercise nuclear stress test.  2. HTN -  well controlled  3. HLP - on lipitor 800 mg po daily, at goal  4. Paroxysmal atrial fibrillation - 1 episode, well controlled on cardizem, agree with ASA only since only 1 episode so far. CHADS-VASc 2  Follow up as needed if normal stress test.   Dorothy Spark, MD, [facility] 09/20/2014, 1:49 PM

## 2014-09-20 NOTE — Patient Instructions (Signed)
Your physician recommends that you continue on your current medications as directed. Please refer to the Current Medication list given to you today.  Your physician has requested that you have en exercise stress myoview. For further information please visit [email or web address]. Please follow instruction sheet, as given. We will contact you with results.  Your physician recommends that you schedule a follow-up appointment in: as needed

## 2014-10-07 ENCOUNTER — Ambulatory Visit (HOSPITAL_COMMUNITY): Payer: BC Managed Care – PPO | Attending: Cardiovascular Disease | Admitting: Radiology

## 2014-10-07 DIAGNOSIS — I10 Essential (primary) hypertension: Secondary | ICD-10-CM | POA: Diagnosis not present

## 2014-10-07 DIAGNOSIS — R0602 Shortness of breath: Secondary | ICD-10-CM | POA: Insufficient documentation

## 2014-10-07 DIAGNOSIS — R0609 Other forms of dyspnea: Secondary | ICD-10-CM | POA: Diagnosis not present

## 2014-10-07 DIAGNOSIS — I480 Paroxysmal atrial fibrillation: Secondary | ICD-10-CM

## 2014-10-07 MED ORDER — TECHNETIUM TC 99M SESTAMIBI GENERIC - CARDIOLITE
33.0000 | Freq: Once | INTRAVENOUS | Status: AC | PRN
  Administered 2014-10-07: 33 via INTRAVENOUS

## 2014-10-07 MED ORDER — TECHNETIUM TC 99M SESTAMIBI GENERIC - CARDIOLITE
11.0000 | Freq: Once | INTRAVENOUS | Status: AC | PRN
  Administered 2014-10-07: 11 via INTRAVENOUS

## 2014-10-07 NOTE — Progress Notes (Signed)
[facility] [address] [place], [place] [address] [phone]    Cardiology Nuclear Med Study  [name] is a 58 y.o. female     MRN : [id number]     DOB: 04/25/1956  Procedure Date: 10/07/2014  Nuclear Med Background Indication for Stress Test:  Evaluation for Ischemia History:  No known CAD, Atrial Fibrillation Cardiac Risk Factors: Hypertension  Symptoms:  DOE and SOB   Nuclear Pre-Procedure Caffeine/Decaff Intake:  None NPO After: 7:00pm   Lungs:  clear O2 Sat: 99% on room air. IV 0.9% NS with Angio Cath:  22g  IV Site: R Hand  IV Started by:  Crissie Figures, RN  Chest Size (in):  38 Cup Size: C  Height: 5' 1.5" (1.562 m)  Weight:  196 lb (88.905 kg)  BMI:  Body mass index is 36.44 kg/(m^2). Tech Comments:  N/A    Nuclear Med Study 1 or 2 day study: 1 day  Stress Test Type:  Stress  Reading MD: N/A  Order Authorizing Provider:  Ottie Glazier, MD  Resting Radionuclide: Technetium 85m Sestamibi  Resting Radionuclide Dose: 11.0 mCi   Stress Radionuclide:  Technetium 1m Sestamibi  Stress Radionuclide Dose: 33.0 mCi           Stress Protocol Rest HR: 65 Stress HR: 157  Rest BP: 142/74 Stress BP: 209/85  Exercise Time (min): 4:00 METS: 4.6   Predicted Max HR: 161 bpm % Max HR: 91.93 bpm Rate Pressure Product: 30932   Dose of Adenosine (mg):  n/a Dose of Lexiscan: n/a mg  Dose of Atropine (mg): n/a Dose of Dobutamine: n/a mcg/kg/min (at max HR)  Stress Test Technologist: Glade Lloyd, BS-ES  Nuclear Technologist:  Earl Many, CNMT     Rest Procedure:  Myocardial perfusion imaging was performed at rest 45 minutes following the intravenous administration of Technetium 45m Sestamibi. Rest ECG: NSR with non-specific ST-T wave changes  Stress Procedure:  The patient exercised on the treadmill utilizing the Bruce Protocol for 4:00 minutes. The patient stopped due to being very SOB, fatigued and denied any chest pain.   Technetium 10m Sestamibi was injected at peak exercise and myocardial perfusion imaging was performed after a brief delay. Stress ECG: Borderline ST changes; brief SVT in recovery.  QPS Raw Data Images:  Acquisition technically good; normal left ventricular size. Stress Images:  Normal homogeneous uptake in all areas of the myocardium. Rest Images:  Normal homogeneous uptake in all areas of the myocardium. Subtraction (SDS):  No evidence of ischemia. Transient Ischemic Dilatation (Normal <1.22):  1.02 Lung/Heart Ratio (Normal <0.45):  0.29  Quantitative Gated Spect Images QGS EDV:  73 ml QGS ESV:  28 ml  Impression Exercise Capacity:  Poor exercise capacity. BP Response:  Hypertensive blood pressure response. Clinical Symptoms:  There is dyspnea. ECG Impression:  Borderline ST changes; brief SVT in recovery. Comparison with Prior Nuclear Study: No previous nuclear study performed  Overall Impression:  Normal stress nuclear study.  LV Ejection Fraction: 61%.  LV Wall Motion:  NL LV Function; NL Wall Motion  Kirk Ruths

## 2014-10-09 ENCOUNTER — Telehealth: Payer: Self-pay | Admitting: *Deleted

## 2014-10-09 MED ORDER — DILTIAZEM HCL ER COATED BEADS 180 MG PO CP24: 180.0000 mg | ORAL_CAPSULE | Freq: Every day | ORAL | Status: DC

## 2014-10-09 NOTE — Telephone Encounter (Signed)
Pt notified that per Dr Meda Coffee her stress test was normal, no prior infarct and no ischemia, however she did have a hypertensive response to the stress part of the test so Dr Meda Coffee recommends she d/c her current diltiazem 120 mg and start taking diltiazem CD 180 mg po daily and follow up in the clinic with her in 3 months.  Confirmed the pharmacy of choice.  Informed the pt that someone from scheduling dept will be contacting her to have this appt set up.  Pt verbalized understanding and agrees with this plan.

## 2014-10-09 NOTE — Telephone Encounter (Signed)
-----   Message from Dorothy Spark, MD sent at 10/08/2014  3:10 PM EST ----- She has normal stress test, no prior infarct and no ischemia. However she had hypertensive response to stress, I would increase diltiazem CD to 180 mg po daily and follow her in 3 months.

## 2014-10-10 ENCOUNTER — Encounter: Payer: Self-pay | Admitting: Cardiology

## 2014-10-16 ENCOUNTER — Encounter: Payer: Self-pay | Admitting: Physician Assistant

## 2014-10-16 ENCOUNTER — Ambulatory Visit (INDEPENDENT_AMBULATORY_CARE_PROVIDER_SITE_OTHER): Payer: BC Managed Care – PPO | Admitting: Physician Assistant

## 2014-10-16 VITALS — BP 142/84 | HR 64 | Temp 97.8°F | Resp 16 | Ht 62.0 in | Wt 196.0 lb

## 2014-10-16 DIAGNOSIS — L089 Local infection of the skin and subcutaneous tissue, unspecified: Secondary | ICD-10-CM

## 2014-10-16 DIAGNOSIS — L298 Other pruritus: Secondary | ICD-10-CM

## 2014-10-16 DIAGNOSIS — S3140XA Unspecified open wound of vagina and vulva, initial encounter: Secondary | ICD-10-CM

## 2014-10-16 DIAGNOSIS — N898 Other specified noninflammatory disorders of vagina: Secondary | ICD-10-CM

## 2014-10-16 MED ORDER — FLUCONAZOLE 150 MG PO TABS: 150.0000 mg | ORAL_TABLET | Freq: Every day | ORAL | Status: DC

## 2014-10-16 MED ORDER — NYSTATIN 100000 UNIT/GM EX POWD: CUTANEOUS | Status: AC

## 2014-10-16 NOTE — Patient Instructions (Signed)
-Take diflucan as prescribed with 1 refill. -Take Nystatin powder as prescribed topically in groin area.  I will call you with results.  If you are not feeling better in 10-14 days, then please call the office.   Candida Infection A Candida infection (also called yeast, fungus, and Monilia infection) is an overgrowth of yeast that can occur anywhere on the body. A yeast infection commonly occurs in warm, moist body areas. Usually, the infection remains localized but can spread to become a systemic infection. A yeast infection may be a sign of a more severe disease such as diabetes, leukemia, or AIDS. A yeast infection can occur in both men and women. In women, Candida vaginitis is a vaginal infection. It is one of the most common causes of vaginitis. Men usually do not have symptoms or know they have an infection until other problems develop. Men may find out they have a yeast infection because their sex partner has a yeast infection. Uncircumcised men are more likely to get a yeast infection than circumcised men. This is because the uncircumcised glans is not exposed to air and does not remain as dry as that of a circumcised glans. Older adults may develop yeast infections around dentures. CAUSES  Women  Antibiotics.  Steroid medication taken for a long time.  Being overweight (obese).  Diabetes.  Poor immune condition.  Certain serious medical conditions.  Immune suppressive medications for organ transplant patients.  Chemotherapy.  Pregnancy.  Menstruation.  Stress and fatigue.  Intravenous drug use.  Oral contraceptives.  Wearing tight-fitting clothes in the crotch area.  Catching it from a sex partner who has a yeast infection.  Spermicide.  Intravenous, urinary, or other catheters. Men  Catching it from a sex partner who has a yeast infection.  Having oral or anal sex with a person who has the infection.  Spermicide.  Diabetes.  Antibiotics.  Poor  immune system.  Medications that suppress the immune system.  Intravenous drug use.  Intravenous, urinary, or other catheters. SYMPTOMS  Women  Thick, white vaginal discharge.  Vaginal itching.  Redness and swelling in and around the vagina.  Irritation of the lips of the vagina and perineum.  Blisters on the vaginal lips and perineum.  Painful sexual intercourse.  Low blood sugar (hypoglycemia).  Painful urination.  Bladder infections.  Intestinal problems such as constipation, indigestion, bad breath, bloating, increase in gas, diarrhea, or loose stools. Men  Men may develop intestinal problems such as constipation, indigestion, bad breath, bloating, increase in gas, diarrhea, or loose stools.  Dry, cracked skin on the penis with itching or discomfort.  Jock itch.  Dry, flaky skin.  Athlete's foot.  Hypoglycemia. DIAGNOSIS  Women  A history and an exam are performed.  The discharge may be examined under a microscope.  A culture may be taken of the discharge. Men  A history and an exam are performed.  Any discharge from the penis or areas of cracked skin will be looked at under the microscope and cultured.  Stool samples may be cultured. TREATMENT  Women  Vaginal antifungal suppositories and creams.  Medicated creams to decrease irritation and itching on the outside of the vagina.  Warm compresses to the perineal area to decrease swelling and discomfort.  Oral antifungal medications.  Medicated vaginal suppositories or cream for repeated or recurrent infections.  Wash and dry the irritation areas before applying the cream.  Eating yogurt with Lactobacillus may help with prevention and treatment.  Sometimes painting the vagina  with gentian violet solution may help if creams and suppositories do not work. Men  Antifungal creams and oral antifungal medications.  Sometimes treatment must continue for 30 days after the symptoms go away to  prevent recurrence. HOME CARE INSTRUCTIONS  Women  Use cotton underwear and avoid tight-fitting clothing.  Avoid colored, scented toilet paper and deodorant tampons or pads.  Do not douche.  Keep your diabetes under control.  Finish all the prescribed medications.  Keep your skin clean and dry.  Consume milk or yogurt with Lactobacillus-active culture regularly. If you get frequent yeast infections and think that is what the infection is, there are over-the-counter medications that you can get. If the infection does not show healing in 3 days, talk to your caregiver.  Tell your sex partner you have a yeast infection. Your partner may need treatment also, especially if your infection does not clear up or recurs. Men  Keep your skin clean and dry.  Keep your diabetes under control.  Finish all prescribed medications.  Tell your sex partner that you have a yeast infection so he or she can be treated if necessary. SEEK MEDICAL CARE IF:   Your symptoms do not clear up or worsen in one week after treatment.  You have an oral temperature above 102 F (38.9 C).  You have trouble swallowing or eating for a prolonged time.  You develop blisters on and around your vagina.  You develop vaginal bleeding and it is not your menstrual period.  You develop abdominal pain.  You develop intestinal problems as mentioned above.  You get weak or light-headed.  You have painful or increased urination.  You have pain during sexual intercourse. MAKE SURE YOU:   Understand these instructions.  Will watch your condition.  Will get help right away if you are not doing well or get worse. Document Released: 11/11/2004 Document Revised: 02/18/2014 Document Reviewed: 02/23/2010 [facility] Patient Information 2015 [place], [place]. This information is not intended to replace advice given to you by your health care provider. Make sure you discuss any questions you have with your health care  provider.  Bacterial Vaginosis Bacterial vaginosis is an infection of the vagina. It happens when too many of certain germs (bacteria) grow in the vagina. HOME CARE  Take your medicine as told by your doctor.  Finish your medicine even if you start to feel better.  Do not have sex until you finish your medicine and are better.  Tell your sex partner that you have an infection. They should see their doctor for treatment.  Practice safe sex. Use condoms. Have only one sex partner. GET HELP IF:  You are not getting better after 3 days of treatment.  You have more grey fluid (discharge) coming from your vagina than before.  You have more pain than before.  You have a fever. MAKE SURE YOU:   Understand these instructions.  Will watch your condition.  Will get help right away if you are not doing well or get worse. Document Released: 07/13/2008 Document Revised: 07/25/2013 Document Reviewed: 05/16/2013 [facility] Patient Information 2015 [place], [place]. This information is not intended to replace advice given to you by your health care provider. Make sure you discuss any questions you have with your health care provider.

## 2014-10-16 NOTE — Progress Notes (Addendum)
Subjective:    Patient ID: CAYLA WIEGAND, female    DOB: 1956/09/08, 58 y.o.   MRN: [id number]  Rash This is a new problem. Episode onset: 3-4 weeks ago. The problem is unchanged. The affected locations include the genitalia. The rash is characterized by itchiness. She was exposed to nothing (Patient was put on antibiotic 08/12/14 from ED due to UTI.). Treatments tried: Patient has tried vaginal fungal creams over the counter and cream from dermatologist.  Vaginal Itching The patient's primary symptoms include genital itching and vaginal discharge. The patient's pertinent negatives include no pelvic pain. Associated symptoms include rash. Pertinent negatives include no dysuria, frequency or urgency.  Patient has h/o MRSA and was told by dermatologist that she has eczema in groin area and was prescribed cream, but patient does not remember name of cream.  Patient states she has been using cream as prescribed. GFR= >89 on 08/20/14 Review of Systems  Constitutional: Negative.   HENT: Negative.   Eyes: Negative.   Respiratory: Negative.   Cardiovascular: Negative.   Gastrointestinal: Negative.   Genitourinary: Positive for vaginal discharge. Negative for dysuria, urgency, frequency, vaginal bleeding, difficulty urinating, vaginal pain and pelvic pain.  Skin: Positive for rash.  Neurological: Negative.    Past Medical History  Diagnosis Date  . MRSA (methicillin resistant staph aureus) culture positive   . Eczema   . Hypercholesteremia   . Hypertension   . Kidney stones   . Anemia   . Vitamin D deficiency   . A-fib    Current Outpatient Prescriptions on File Prior to Visit  Medication Sig Dispense Refill  . ascorbic acid (VITAMIN C) 1000 MG tablet Take 1,000 mg by mouth daily.    Marland Kitchen aspirin EC 81 MG tablet Take 81 mg by mouth daily.    Marland Kitchen atenolol (TENORMIN) 100 MG tablet Take 50 mg by mouth daily.    Marland Kitchen atorvastatin (LIPITOR) 80 MG tablet Take 26.67 mg by mouth See admin  instructions. Take 1/3 tablet by mouth three times a week on Tuesday, thursday, and saturday    . Cholecalciferol (VITAMIN D PO) Take 4,000 Units by mouth daily.     Marland Kitchen diltiazem (CARDIZEM CD) 180 MG 24 hr capsule Take 1 capsule (180 mg total) by mouth daily. 90 capsule 3  . doxepin (SINEQUAN) 75 MG capsule Take 75 mg by mouth at bedtime.    . Iron Combinations (IRON COMPLEX PO) Take 1 tablet by mouth daily.    . Olopatadine HCl (PATADAY) 0.2 % SOLN Apply 1 drop to eye daily.    . potassium chloride SA (K-DUR,KLOR-CON) 20 MEQ tablet Take 20 mEq by mouth 2 (two) times daily.    Marland Kitchen triamterene-hydrochlorothiazide (MAXZIDE-25) 37.5-25 MG per tablet Take 1 tablet by mouth daily.     No current facility-administered medications on file prior to visit.   Allergies  Allergen Reactions  . Penicillins Other (See Comments)    DOESN'T REMEMBER     BP 142/84 mmHg  Pulse 64  Temp(Src) 97.8 F (36.6 C) (Temporal)  Resp 16  Ht 5\' 2"  (1.575 m)  Wt 196 lb (88.905 kg)  BMI 35.84 kg/m2 Wt Readings from Last 3 Encounters:  10/16/14 196 lb (88.905 kg)  10/07/14 196 lb (88.905 kg)  09/20/14 199 lb (90.266 kg)   Objective:   Physical Exam  Constitutional: She is oriented to person, place, and time. She appears well-developed and well-nourished. She does not have a sickly appearance. No distress.  HENT:  Head: Normocephalic.  Eyes: Conjunctivae and lids are normal. Right eye exhibits no discharge. Left eye exhibits no discharge. No scleral icterus.  Neck: Normal range of motion. Neck supple.  Cardiovascular: Normal rate, regular rhythm, S1 normal, S2 normal, normal heart sounds and normal pulses.  Exam reveals no gallop, no distant heart sounds and no friction rub.   No murmur heard. Pulmonary/Chest: Effort normal and breath sounds normal. No respiratory distress. She has no decreased breath sounds. She has no wheezes. She has no rhonchi. She has no rales. She exhibits no tenderness.  Abdominal: Soft.  Bowel sounds are normal. There is no tenderness. There is no rebound and no guarding.  Pendulous abdomen.  Genitourinary:    Pelvic exam was performed with patient in the knee-chest position. There is rash on the right labia. There is no tenderness or injury on the right labia. There is rash on the left labia. There is no tenderness or injury on the left labia. Cervix exhibits discharge. Cervix exhibits no motion tenderness and no friability. No erythema, tenderness or bleeding in the vagina. No signs of injury around the vagina. Vaginal discharge found.  Patient has obese body habitus with multiple skin folds.  The skin is erythematous and shiny macule with defined borders.  Mild green discharge during speculum exam.  No odor during exam.  Lymphadenopathy:  No tenderness or LAD.  Neurological: She is alert and oriented to person, place, and time. Gait normal.  Skin: Skin is warm, dry and intact. Rash noted. Rash is macular. She is not diaphoretic. No pallor.  Erythematous shiny macular rash in groin and upper thigh area.  Psychiatric: She has a normal mood and affect. Her speech is normal and behavior is normal. Judgment and thought content normal. Cognition and memory are normal.  Vitals reviewed.  Assessment & Plan:  1. Vaginal itching Ordered labs to R/O yeast infection, bacterial vaginosis, trichomonas, UTI  - WET PREP BY MOLECULAR PROBE - Urine culture - Urinalysis, Routine w reflex microscopic -Take Diflucan as prescribed- fluconazole (DIFLUCAN) 150 MG tablet; Take 1 tablet (150 mg total) by mouth daily.  Dispense: 1 tablet; Refill: 1 -Take Nystatin Powder as prescribed topically- nystatin (MYCOSTATIN/NYSTOP) 100000 UNIT/GM POWD; Apply powder topically BID to groin area.  Dispense: 60 g; Refill: 1  2. Vaginal wound, initial encounter Ordered swab due to patient's H/O MRSA.   - Wound culture  Discussed medication effects and SE's.  Pt agreed to treatment plan. If you are not  feeling better in 10-14 days, then please call the office. Please keep your follow up appt on 12/05/14.  Addendum: Urine Culture came back contaminated.  Urinalysis came back normal.  Wet prep came back negative for Candida, Trichomonas and Gardnerella.  Wound culture came back Staph Aureus and Group B Strep (S. Agalactiae).  Will send in bactrim to pharmacy due to sensitivity report.  Will have patient stop Nystatin powder. - sulfamethoxazole-trimethoprim (BACTRIM DS,SEPTRA DS) 800-160 MG per tablet; Take 1 tablet by mouth 2 (two) times daily. For 7 days with food .  Dispense: 14 tablet; Refill: 0    Kacy Conely, Stephani Police, PA-C 1:55 PM [facility]

## 2014-10-17 LAB — URINE CULTURE: Colony Count: 70000

## 2014-10-17 LAB — URINALYSIS, ROUTINE W REFLEX MICROSCOPIC
BILIRUBIN URINE: NEGATIVE
Glucose, UA: NEGATIVE mg/dL
Hgb urine dipstick: NEGATIVE
Ketones, ur: NEGATIVE mg/dL
Leukocytes, UA: NEGATIVE
Nitrite: NEGATIVE
PH: 8 (ref 5.0–8.0)
PROTEIN: NEGATIVE mg/dL
Specific Gravity, Urine: 1.009 (ref 1.005–1.030)
Urobilinogen, UA: 0.2 mg/dL (ref 0.0–1.0)

## 2014-10-17 LAB — WET PREP BY MOLECULAR PROBE
Candida species: NEGATIVE
GARDNERELLA VAGINALIS: NEGATIVE
Trichomonas vaginosis: NEGATIVE

## 2014-10-18 DIAGNOSIS — I4891 Unspecified atrial fibrillation: Secondary | ICD-10-CM

## 2014-10-18 HISTORY — DX: Unspecified atrial fibrillation: I48.91

## 2014-10-20 LAB — WOUND CULTURE: Gram Stain: NONE SEEN

## 2014-10-20 MED ORDER — SULFAMETHOXAZOLE-TRIMETHOPRIM 800-160 MG PO TABS: 1.0000 | ORAL_TABLET | Freq: Two times a day (BID) | ORAL | Status: DC

## 2014-10-20 NOTE — Addendum Note (Signed)
Addended by: Charolette Forward on: 10/20/2014 06:41 PM   Modules accepted: Orders

## 2014-10-21 ENCOUNTER — Other Ambulatory Visit: Payer: Self-pay | Admitting: *Deleted

## 2014-10-21 MED ORDER — ATENOLOL 100 MG PO TABS: 100.0000 mg | ORAL_TABLET | Freq: Every day | ORAL | Status: DC

## 2014-11-07 ENCOUNTER — Telehealth: Payer: Self-pay | Admitting: *Deleted

## 2014-11-07 NOTE — Telephone Encounter (Signed)
Patient called with concerns about clear vaginal discharge.  Patient states when she got out of the shower today she noticed a small amount of clear discharge from her vagina, uncertain if water, urine, etc.  Patient was advised to continue to monitor and if any change in consistency of discharge to call for ov for further eval. And treatment if needed.

## 2014-11-11 ENCOUNTER — Other Ambulatory Visit: Payer: Self-pay | Admitting: Emergency Medicine

## 2014-11-11 MED ORDER — POTASSIUM CHLORIDE CRYS ER 20 MEQ PO TBCR: 20.0000 meq | EXTENDED_RELEASE_TABLET | Freq: Two times a day (BID) | ORAL | Status: DC

## 2014-12-05 ENCOUNTER — Ambulatory Visit (INDEPENDENT_AMBULATORY_CARE_PROVIDER_SITE_OTHER): Payer: BC Managed Care – PPO | Admitting: Physician Assistant

## 2014-12-05 ENCOUNTER — Encounter: Payer: Self-pay | Admitting: Physician Assistant

## 2014-12-05 ENCOUNTER — Other Ambulatory Visit: Payer: Self-pay | Admitting: Internal Medicine

## 2014-12-05 VITALS — BP 122/70 | HR 66 | Temp 98.2°F | Resp 18 | Ht 62.0 in | Wt 201.0 lb

## 2014-12-05 DIAGNOSIS — I480 Paroxysmal atrial fibrillation: Secondary | ICD-10-CM

## 2014-12-05 DIAGNOSIS — E559 Vitamin D deficiency, unspecified: Secondary | ICD-10-CM

## 2014-12-05 DIAGNOSIS — E669 Obesity, unspecified: Secondary | ICD-10-CM | POA: Insufficient documentation

## 2014-12-05 DIAGNOSIS — I10 Essential (primary) hypertension: Secondary | ICD-10-CM

## 2014-12-05 DIAGNOSIS — Z79899 Other long term (current) drug therapy: Secondary | ICD-10-CM

## 2014-12-05 DIAGNOSIS — E782 Mixed hyperlipidemia: Secondary | ICD-10-CM

## 2014-12-05 DIAGNOSIS — R7303 Prediabetes: Secondary | ICD-10-CM

## 2014-12-05 DIAGNOSIS — R7309 Other abnormal glucose: Secondary | ICD-10-CM

## 2014-12-05 DIAGNOSIS — N3000 Acute cystitis without hematuria: Secondary | ICD-10-CM

## 2014-12-05 NOTE — Patient Instructions (Signed)
Before you even begin to attack a weight-loss plan, it pays to remember this: You are not fat. You have fat. Losing weight isn't about blame or shame; it's simply another achievement to accomplish. Dieting is like any other skill-you have to buckle down and work at it. As long as you act in a smart, reasonable way, you'll ultimately get where you want to be. Here are some weight loss pearls for you.  1. It's Not a Diet. It's a Lifestyle Thinking of a diet as something you're on and suffering through only for the short term doesn't work. To shed weight and keep it off, you need to make permanent changes to the way you eat. It's OK to indulge occasionally, of course, but if you cut calories temporarily and then revert to your old way of eating, you'll gain back the weight quicker than you can say yo-yo. Use it to lose it. Research shows that one of the best predictors of long-term weight loss is how many pounds you drop in the first month. For that reason, nutritionists often suggest being stricter for the first two weeks of your new eating strategy to build momentum. Cut out added sugar and alcohol and avoid unrefined carbs. After that, figure out how you can reincorporate them in a way that's healthy and maintainable.  2. There's a Right Way to Exercise Working out burns calories and fat and boosts your metabolism by building muscle. But those trying to lose weight are notorious for overestimating the number of calories they burn and underestimating the amount they take in. Unfortunately, your system is biologically programmed to hold on to extra pounds and that means when you start exercising, your body senses the deficit and ramps up its hunger signals. If you're not diligent, you'll eat everything you burn and then some. Use it to lose it. Cardio gets all the exercise glory, but strength and interval training are the real heroes. They help you build lean muscle, which in turn increases your metabolism and  calorie-burning ability 3. Don't Overreact to Mild Hunger Some people have a hard time losing weight because of hunger anxiety. To them, being hungry is bad-something to be avoided at all costs-so they carry snacks with them and eat when they don't need to. Others eat because they're stressed out or bored. While you never want to get to the point of being ravenous (that's when bingeing is likely to happen), a hunger pang, a craving, or the fact that it's 3:00 p.m. should not send you racing for the vending machine or obsessing about the energy bar in your purse. Ideally, you should put off eating until your stomach is growling and it's difficult to concentrate.  Use it to lose it. When you feel the urge to eat, use the HALT method. Ask yourself, Am I really hungry? Or am I angry or anxious, lonely or bored, or tired? If you're still not certain, try the apple test. If you're truly hungry, an apple should seem delicious; if it doesn't, something else is going on. Or you can try drinking water and making yourself busy, if you are still hungry try a healthy snack.  4. Not All Calories Are Created Equal The mechanics of weight loss are pretty simple: Take in fewer calories than you use for energy. But the kind of food you eat makes all the difference. Processed food that's high in saturated fat and refined starch or sugar can cause inflammation that disrupts the hormone signals that tell   your brain you're full. The result: You eat a lot more.  Use it to lose it. Clean up your diet. Swap in whole, unprocessed foods, including vegetables, lean protein, and healthy fats that will fill you up and give you the biggest nutritional bang for your calorie buck. In a few weeks, as your brain starts receiving regular hunger and fullness signals once again, you'll notice that you feel less hungry overall and naturally start cutting back on the amount you eat.  5. Protein, Produce, and Plant-Based Fats Are Your Weight-Loss  [place] Here's why eating the three Ps regularly will help you drop pounds. Protein fills you up. You need it to build lean muscle, which keeps your metabolism humming so that you can torch more fat. People in a weight-loss program who ate double the recommended daily allowance for protein (about 110 grams for a 150-pound woman) lost 70 percent of their weight from fat, while people who ate the RDA lost only about 40 percent, one study found. Produce is packed with filling fiber. "It's very difficult to consume too many calories if you're eating a lot of vegetables. Example: Three cups of broccoli is a lot of food, yet only 93 calories. (Fruit is another story. It can be easy to overeat and can contain a lot of calories from sugar, so be sure to monitor your intake.) Plant-based fats like olive oil and those in avocados and nuts are healthy and extra satiating.  Use it to lose it. Aim to incorporate each of the three Ps into every meal and snack. People who eat protein throughout the day are able to keep weight off, according to a study in the American Journal of Clinical Nutrition. In addition to meat, poultry and seafood, good sources are beans, lentils, eggs, tofu, and yogurt. As for fat, keep portion sizes in check by measuring out salad dressing, oil, and nut butters (shoot for one to two tablespoons). Finally, eat veggies or a little fruit at every meal. People who did that consumed 308 fewer calories but didn't feel any hungrier than when they didn't eat more produce.  7. How You Eat Is As Important As What You Eat In order for your brain to register that you're full, you need to focus on what you're eating. Sit down whenever you eat, preferably at a table. Turn off the TV or computer, put down your phone, and look at your food. Smell it. Chew slowly, and don't put another bite on your fork until you swallow. When women ate lunch this attentively, they consumed 30 percent less when snacking later than  those who listened to an audiobook at lunchtime, according to a study in the British Journal of Nutrition. 8. Weighing Yourself Really Works The scale provides the best evidence about whether your efforts are paying off. Seeing the numbers tick up or down or stagnate is motivation to keep going-or to rethink your approach. A 2015 study at [employer] found that daily weigh-ins helped people lose more weight, keep it off, and maintain that loss, even after two years. Use it to lose it. Step on the scale at the same time every day for the best results. If your weight shoots up several pounds from one weigh-in to the next, don't freak out. Eating a lot of salt the night before or having your period is the likely culprit. The number should return to normal in a day or two. It's a steady climb that you need to do something about.   9. Too Much Stress and Too Little Sleep Are Your Enemies When you're tired and frazzled, your body cranks up the production of cortisol, the stress hormone that can cause carb cravings. Not getting enough sleep also boosts your levels of ghrelin, a hormone associated with hunger, while suppressing leptin, a hormone that signals fullness and satiety. People on a diet who slept only five and a half hours a night for two weeks lost 55 percent less fat and were hungrier than those who slept eight and a half hours, according to a study in the [employer] Journal. Use it to lose it. Prioritize sleep, aiming for seven hours or more a night, which research shows helps lower stress. And make sure you're getting quality zzz's. If a snoring spouse or a fidgety cat wakes you up frequently throughout the night, you may end up getting the equivalent of just four hours of sleep, according to a study from [employer]. Keep pets out of the bedroom, and use a white-noise app to drown out snoring. 10. You Will Hit a plateau-And You Can Bust Through It As you slim down, your  body releases much less leptin, the fullness hormone.  If you're not strength training, start right now. Building muscle can raise your metabolism to help you overcome a plateau. To keep your body challenged and burning calories, incorporate new moves and more intense intervals into your workouts or add another sweat session to your weekly routine. Alternatively, cut an extra 100 calories or so a day from your diet. Now that you've lost weight, your body simply doesn't need as much fuel.   

## 2014-12-05 NOTE — Progress Notes (Signed)
Assessment and Plan:  Hypertension: Continue medication, monitor blood pressure at home. Continue DASH diet.  Reminder to go to the ER if any CP, SOB, nausea, dizziness, severe HA, changes vision/speech, left arm numbness and tingling, and jaw pain. Cholesterol: Continue diet and exercise. Check cholesterol.  Pre-diabetes-Continue diet and exercise. Check A1C Vitamin D Def- check level and continue medications.  Afib- continue ASA and CCB- no CV's Obesity with co morbidities- long discussion about weight loss, diet, and exercise Urinary frequency- check for UTI  Continue diet and meds as discussed. Further disposition pending results of labs.  HPI 59 y.o. female  presents for 3 month follow up with hypertension, hyperlipidemia, prediabetes and vitamin D.  Her blood pressure has been controlled at home, today their BP is BP: 122/70 mmHg  She does workout. She denies chest pain, shortness of breath, dizziness. She has Afib, follows with Dr. Meda Coffee, increased to diltizem 180 and will continue on bASA for now since CHADSVASC score of 2.   She is on cholesterol medication, lipitor 80mg  1/3 every day and denies myalgias. Her cholesterol is at goal. The cholesterol last visit was:   Lab Results  Component Value Date   CHOL 158 08/20/2014   HDL 69 08/20/2014   LDLCALC 77 08/20/2014   TRIG 60 08/20/2014   CHOLHDL 2.3 08/20/2014   She has been working on diet and exercise for prediabetes, and denies nausea, paresthesia of the feet, polydipsia and polyuria. Last A1C in the office was:  Lab Results  Component Value Date   HGBA1C 5.6 08/20/2014  Patient is on Vitamin D supplement.   Lab Results  Component Value Date   VD25OH 68 08/20/2014   She has eczema and had a shot in Jan for a break out.   BMI is Body mass index is 36.75 kg/(m^2)., she is working on diet and exercise. Wt Readings from Last 3 Encounters:  12/05/14 201 lb (91.173 kg)  10/16/14 196 lb (88.905 kg)  10/07/14 196 lb (88.905  kg)     Current Medications:  Current Outpatient Prescriptions on File Prior to Visit  Medication Sig Dispense Refill  . ascorbic acid (VITAMIN C) 1000 MG tablet Take 1,000 mg by mouth daily.    Marland Kitchen aspirin EC 81 MG tablet Take 81 mg by mouth daily.    Marland Kitchen atenolol (TENORMIN) 100 MG tablet Take 1 tablet (100 mg total) by mouth daily. Take 1/2-1 pill daily AD 90 tablet 0  . atorvastatin (LIPITOR) 80 MG tablet Take 26.67 mg by mouth See admin instructions. Take 1/3 tablet by mouth three times a week on Tuesday, thursday, and saturday    . Cholecalciferol (VITAMIN D PO) Take 4,000 Units by mouth daily.     Marland Kitchen diltiazem (CARDIZEM CD) 180 MG 24 hr capsule Take 1 capsule (180 mg total) by mouth daily. 90 capsule 3  . doxepin (SINEQUAN) 75 MG capsule Take 75 mg by mouth at bedtime.    . fluconazole (DIFLUCAN) 150 MG tablet Take 1 tablet (150 mg total) by mouth daily. 1 tablet 1  . Iron Combinations (IRON COMPLEX PO) Take 1 tablet by mouth daily.    . Olopatadine HCl (PATADAY) 0.2 % SOLN Apply 1 drop to eye daily.    . potassium chloride SA (K-DUR,KLOR-CON) 20 MEQ tablet Take 1 tablet (20 mEq total) by mouth 2 (two) times daily. 180 tablet 1  . triamterene-hydrochlorothiazide (MAXZIDE-25) 37.5-25 MG per tablet Take 1 tablet by mouth daily.     No current facility-administered  medications on file prior to visit.   Medical History:  Past Medical History  Diagnosis Date  . MRSA (methicillin resistant staph aureus) culture positive   . Eczema   . Hypercholesteremia   . Hypertension   . Kidney stones   . Anemia   . Vitamin D deficiency   . A-fib    Allergies:  Allergies  Allergen Reactions  . Penicillins Other (See Comments)    DOESN'T REMEMBER     Review of Systems:  Review of Systems  Constitutional: Negative.   HENT: Negative.   Respiratory: Negative.   Cardiovascular: Negative.  Negative for palpitations and leg swelling.  Gastrointestinal: Negative.   Genitourinary: Positive for  frequency. Negative for dysuria, urgency, hematuria and flank pain.  Musculoskeletal: Negative.   Skin: Positive for rash. Negative for itching.  Neurological: Negative.   Psychiatric/Behavioral: Negative.     Family history- Review and unchanged Social history- Review and unchanged Physical Exam: BP 122/70 mmHg  Pulse 66  Temp(Src) 98.2 F (36.8 C) (Temporal)  Resp 18  Ht 5\' 2"  (1.575 m)  Wt 201 lb (91.173 kg)  BMI 36.75 kg/m2 Wt Readings from Last 3 Encounters:  12/05/14 201 lb (91.173 kg)  10/16/14 196 lb (88.905 kg)  10/07/14 196 lb (88.905 kg)   General Appearance: Well nourished, in no apparent distress. Eyes: PERRLA, EOMs, conjunctiva no swelling or erythema Sinuses: No Frontal/maxillary tenderness ENT/Mouth: Ext aud canals clear, TMs without erythema, bulging. No erythema, swelling, or exudate on post pharynx.  Tonsils not swollen or erythematous. Hearing normal.  Neck: Supple, thyroid normal.  Respiratory: Respiratory effort normal, BS equal bilaterally without rales, rhonchi, wheezing or stridor.  Cardio: RRR with no MRGs. Brisk peripheral pulses without edema.  Abdomen: Soft, + BS, obese, Non tender, no guarding, rebound, hernias, masses. Lymphatics: Non tender without lymphadenopathy.  Musculoskeletal: Full ROM, 5/5 strength, Normal gait Skin: Warm, dry without rashes, lesions, ecchymosis.  Neuro: Cranial nerves intact. Normal muscle tone, no cerebellar symptoms. Psych: Awake and oriented X 3, normal affect, Insight and Judgment appropriate.    Vicie Mutters, PA-C 4:55 PM [facility]

## 2014-12-06 LAB — URINALYSIS, ROUTINE W REFLEX MICROSCOPIC
BILIRUBIN URINE: NEGATIVE
GLUCOSE, UA: NEGATIVE mg/dL
Hgb urine dipstick: NEGATIVE
Ketones, ur: NEGATIVE mg/dL
Nitrite: NEGATIVE
PH: 6 (ref 5.0–8.0)
Protein, ur: NEGATIVE mg/dL
Specific Gravity, Urine: 1.019 (ref 1.005–1.030)
Urobilinogen, UA: 0.2 mg/dL (ref 0.0–1.0)

## 2014-12-06 LAB — CBC WITH DIFFERENTIAL/PLATELET
Basophils Absolute: 0 10*3/uL (ref 0.0–0.1)
Basophils Relative: 0 % (ref 0–1)
EOS ABS: 0.1 10*3/uL (ref 0.0–0.7)
EOS PCT: 2 % (ref 0–5)
HEMATOCRIT: 39.9 % (ref 36.0–46.0)
Hemoglobin: 13.2 g/dL (ref 12.0–15.0)
Lymphocytes Relative: 28 % (ref 12–46)
Lymphs Abs: 1.7 10*3/uL (ref 0.7–4.0)
MCH: 29.9 pg (ref 26.0–34.0)
MCHC: 33.1 g/dL (ref 30.0–36.0)
MCV: 90.5 fL (ref 78.0–100.0)
MONO ABS: 0.9 10*3/uL (ref 0.1–1.0)
MONOS PCT: 14 % — AB (ref 3–12)
MPV: 9.5 fL (ref 8.6–12.4)
NEUTROS PCT: 56 % (ref 43–77)
Neutro Abs: 3.4 10*3/uL (ref 1.7–7.7)
Platelets: 296 10*3/uL (ref 150–400)
RBC: 4.41 MIL/uL (ref 3.87–5.11)
RDW: 15.2 % (ref 11.5–15.5)
WBC: 6.1 10*3/uL (ref 4.0–10.5)

## 2014-12-06 LAB — LIPID PANEL
CHOL/HDL RATIO: 3 ratio
Cholesterol: 184 mg/dL (ref 0–200)
HDL: 62 mg/dL (ref >39)
LDL CALC: 112 mg/dL — AB (ref 0–99)
Triglycerides: 48 mg/dL (ref <150)
VLDL: 10 mg/dL (ref 0–40)

## 2014-12-06 LAB — URINE CULTURE
Colony Count: NO GROWTH
Organism ID, Bacteria: NO GROWTH

## 2014-12-06 LAB — BASIC METABOLIC PANEL WITH GFR
BUN: 17 mg/dL (ref 6–23)
CO2: 25 meq/L (ref 19–32)
Calcium: 9.1 mg/dL (ref 8.4–10.5)
Chloride: 101 mEq/L (ref 96–112)
Creat: 0.82 mg/dL (ref 0.50–1.10)
GFR, Est Non African American: 79 mL/min
GLUCOSE: 73 mg/dL (ref 70–99)
Potassium: 4.1 mEq/L (ref 3.5–5.3)
Sodium: 138 mEq/L (ref 135–145)

## 2014-12-06 LAB — URINALYSIS, MICROSCOPIC ONLY
Bacteria, UA: NONE SEEN
CASTS: NONE SEEN
Crystals: NONE SEEN
SQUAMOUS EPITHELIAL / LPF: NONE SEEN

## 2014-12-06 LAB — HEPATIC FUNCTION PANEL
ALT: 13 U/L (ref 0–35)
AST: 12 U/L (ref 0–37)
Albumin: 3.6 g/dL (ref 3.5–5.2)
Alkaline Phosphatase: 77 U/L (ref 39–117)
BILIRUBIN DIRECT: 0.1 mg/dL (ref 0.0–0.3)
BILIRUBIN INDIRECT: 0.4 mg/dL (ref 0.2–1.2)
BILIRUBIN TOTAL: 0.5 mg/dL (ref 0.2–1.2)
Total Protein: 6.2 g/dL (ref 6.0–8.3)

## 2014-12-06 LAB — HEMOGLOBIN A1C
Hgb A1c MFr Bld: 5.8 % — ABNORMAL HIGH (ref <5.7)
Mean Plasma Glucose: 120 mg/dL — ABNORMAL HIGH (ref <117)

## 2014-12-06 LAB — VITAMIN D 25 HYDROXY (VIT D DEFICIENCY, FRACTURES): VIT D 25 HYDROXY: 44 ng/mL (ref 30–100)

## 2014-12-06 LAB — TSH: TSH: 1.155 u[IU]/mL (ref 0.350–4.500)

## 2014-12-06 LAB — MAGNESIUM: Magnesium: 1.7 mg/dL (ref 1.5–2.5)

## 2014-12-17 ENCOUNTER — Other Ambulatory Visit: Payer: Self-pay | Admitting: Internal Medicine

## 2014-12-17 DIAGNOSIS — R300 Dysuria: Secondary | ICD-10-CM

## 2014-12-18 ENCOUNTER — Other Ambulatory Visit: Payer: BC Managed Care – PPO

## 2014-12-18 DIAGNOSIS — R300 Dysuria: Secondary | ICD-10-CM

## 2014-12-19 LAB — URINALYSIS, MICROSCOPIC ONLY
Bacteria, UA: NONE SEEN
Casts: NONE SEEN
Crystals: NONE SEEN
Squamous Epithelial / LPF: NONE SEEN

## 2014-12-20 ENCOUNTER — Encounter: Payer: Self-pay | Admitting: Internal Medicine

## 2014-12-20 ENCOUNTER — Ambulatory Visit (INDEPENDENT_AMBULATORY_CARE_PROVIDER_SITE_OTHER): Payer: BC Managed Care – PPO | Admitting: Internal Medicine

## 2014-12-20 VITALS — BP 126/80 | HR 62 | Temp 98.6°F | Resp 16 | Ht 62.0 in | Wt 192.0 lb

## 2014-12-20 DIAGNOSIS — N3000 Acute cystitis without hematuria: Secondary | ICD-10-CM

## 2014-12-20 DIAGNOSIS — R112 Nausea with vomiting, unspecified: Secondary | ICD-10-CM

## 2014-12-20 LAB — HEPATIC FUNCTION PANEL
ALT: 13 U/L (ref 0–35)
AST: 13 U/L (ref 0–37)
Albumin: 4.2 g/dL (ref 3.5–5.2)
Alkaline Phosphatase: 93 U/L (ref 39–117)
BILIRUBIN INDIRECT: 0.5 mg/dL (ref 0.2–1.2)
Bilirubin, Direct: 0.1 mg/dL (ref 0.0–0.3)
TOTAL PROTEIN: 6.9 g/dL (ref 6.0–8.3)
Total Bilirubin: 0.6 mg/dL (ref 0.2–1.2)

## 2014-12-20 LAB — CBC WITH DIFFERENTIAL/PLATELET
BASOS ABS: 0 10*3/uL (ref 0.0–0.1)
Basophils Relative: 0 % (ref 0–1)
Eosinophils Absolute: 0.1 10*3/uL (ref 0.0–0.7)
Eosinophils Relative: 1 % (ref 0–5)
HCT: 43.8 % (ref 36.0–46.0)
HEMOGLOBIN: 14.6 g/dL (ref 12.0–15.0)
LYMPHS ABS: 1.2 10*3/uL (ref 0.7–4.0)
LYMPHS PCT: 23 % (ref 12–46)
MCH: 30.4 pg (ref 26.0–34.0)
MCHC: 33.3 g/dL (ref 30.0–36.0)
MCV: 91.3 fL (ref 78.0–100.0)
MONO ABS: 0.8 10*3/uL (ref 0.1–1.0)
MPV: 9.6 fL (ref 8.6–12.4)
Monocytes Relative: 15 % — ABNORMAL HIGH (ref 3–12)
Neutro Abs: 3.1 10*3/uL (ref 1.7–7.7)
Neutrophils Relative %: 61 % (ref 43–77)
Platelets: 292 10*3/uL (ref 150–400)
RBC: 4.8 MIL/uL (ref 3.87–5.11)
RDW: 14.6 % (ref 11.5–15.5)
WBC: 5.1 10*3/uL (ref 4.0–10.5)

## 2014-12-20 LAB — URINE CULTURE: Colony Count: 15000

## 2014-12-20 LAB — BASIC METABOLIC PANEL WITH GFR
BUN: 7 mg/dL (ref 6–23)
CO2: 30 mEq/L (ref 19–32)
Calcium: 9.9 mg/dL (ref 8.4–10.5)
Chloride: 98 mEq/L (ref 96–112)
Creat: 0.71 mg/dL (ref 0.50–1.10)
Glucose, Bld: 75 mg/dL (ref 70–99)
POTASSIUM: 3.3 meq/L — AB (ref 3.5–5.3)
Sodium: 142 mEq/L (ref 135–145)

## 2014-12-20 MED ORDER — PROMETHAZINE HCL 25 MG PO TABS: 25.0000 mg | ORAL_TABLET | Freq: Four times a day (QID) | ORAL | Status: DC | PRN

## 2014-12-20 MED ORDER — CIPROFLOXACIN HCL 250 MG PO TABS: 250.0000 mg | ORAL_TABLET | Freq: Two times a day (BID) | ORAL | Status: DC

## 2014-12-20 NOTE — Progress Notes (Signed)
   Subjective:    Patient ID: AYME SHORT, female    DOB: 10-30-1955, 59 y.o.   MRN: [id number]  HPI  Patient is 59 y.o. Who presents to the office for evaluation of nausea, vomiting, loose stools, and abdominal discomfort x 2 days.  Patient states that last week she had some urinary urgency and dysuria.  She gave urine on 12/18/14.  She then developed severe nausea and vomiting last night.  She cannot count her episodes.  Vomit is non-bilious and non-bloody.  She has also had some loose stools.  She has no aggravating factors and no relieving factors.  No flank pain.    Review of Systems  Constitutional: Positive for chills. Negative for fever and fatigue.  Respiratory: Negative for chest tightness and shortness of breath.   Cardiovascular: Negative for chest pain and palpitations.  Gastrointestinal: Positive for nausea, vomiting, abdominal pain and diarrhea. Negative for constipation, blood in stool, anal bleeding and rectal pain.  Genitourinary: Positive for urgency and frequency. Negative for dysuria, hematuria, flank pain, vaginal bleeding, vaginal discharge, difficulty urinating and vaginal pain.  Neurological: Negative for dizziness and light-headedness.  All other systems reviewed and are negative.      Objective:   Physical Exam  Constitutional: She is oriented to person, place, and time. She appears well-developed and well-nourished. No distress.  HENT:  Head: Normocephalic and atraumatic.  Mouth/Throat: Oropharynx is clear and moist. No oropharyngeal exudate.  Eyes: Conjunctivae and EOM are normal. Pupils are equal, round, and reactive to light. No scleral icterus.  Neck: Normal range of motion. Neck supple. No JVD present. No thyromegaly present.  Cardiovascular: Normal rate, regular rhythm, normal heart sounds and intact distal pulses.  Exam reveals no gallop and no friction rub.   No murmur heard. Pulmonary/Chest: Effort normal and breath sounds normal. No respiratory  distress. She has no wheezes. She has no rales. She exhibits no tenderness.  Abdominal: Soft. Normal appearance and bowel sounds are normal. She exhibits no distension and no mass. There is tenderness in the suprapubic area. There is no rigidity, no rebound, no guarding, no CVA tenderness, no tenderness at McBurney's point and negative Murphy's sign.  Musculoskeletal: Normal range of motion.  Lymphadenopathy:    She has no cervical adenopathy.  Neurological: She is alert and oriented to person, place, and time.  Skin: Skin is warm and dry. She is not diaphoretic.  Psychiatric: She has a normal mood and affect. Her behavior is normal. Judgment and thought content normal.  Nursing note and vitals reviewed.  Filed Vitals:   12/20/14 0951  BP: 126/80  Pulse: 62  Temp: 98.6 F (37 C)  Resp: 16          Assessment & Plan:    1. Non-intractable vomiting with nausea, vomiting of unspecified type - phenergna tablets - CBC with Differential/Platelet - Hepatic function panel - BASIC METABOLIC PANEL WITH GFR  2. Acute cystitis without hematuria - cipro 250 x 7 days - Pt to go to ED if she develops severe dehydration, flank pain, or intractable abdominal pain

## 2014-12-20 NOTE — Patient Instructions (Signed)
GO TO THE ER IF YOU HAVE SEVERE DEHYDRATION, YOU CANNOT STOP VOMITING, OR YOU DEVELOP PAIN IN YOUR FLANK OR UNDER YOUR RIBS.  Nausea and Vomiting Nausea is a sick feeling that often comes before throwing up (vomiting). Vomiting is a reflex where stomach contents come out of your mouth. Vomiting can cause severe loss of body fluids (dehydration). Children and elderly adults can become dehydrated quickly, especially if they also have diarrhea. Nausea and vomiting are symptoms of a condition or disease. It is important to find the cause of your symptoms. CAUSES   Direct irritation of the stomach lining. This irritation can result from increased acid production (gastroesophageal reflux disease), infection, food poisoning, taking certain medicines (such as nonsteroidal anti-inflammatory drugs), alcohol use, or tobacco use.  Signals from the brain.These signals could be caused by a headache, heat exposure, an inner ear disturbance, increased pressure in the brain from injury, infection, a tumor, or a concussion, pain, emotional stimulus, or metabolic problems.  An obstruction in the gastrointestinal tract (bowel obstruction).  Illnesses such as diabetes, hepatitis, gallbladder problems, appendicitis, kidney problems, cancer, sepsis, atypical symptoms of a heart attack, or eating disorders.  Medical treatments such as chemotherapy and radiation.  Receiving medicine that makes you sleep (general anesthetic) during surgery. DIAGNOSIS Your caregiver may ask for tests to be done if the problems do not improve after a few days. Tests may also be done if symptoms are severe or if the reason for the nausea and vomiting is not clear. Tests may include:  Urine tests.  Blood tests.  Stool tests.  Cultures (to look for evidence of infection).  X-rays or other imaging studies. Test results can help your caregiver make decisions about treatment or the need for additional tests. TREATMENT You need to stay  well hydrated. Drink frequently but in small amounts.You may wish to drink water, sports drinks, clear broth, or eat frozen ice pops or gelatin dessert to help stay hydrated.When you eat, eating slowly may help prevent nausea.There are also some antinausea medicines that may help prevent nausea. HOME CARE INSTRUCTIONS   Take all medicine as directed by your caregiver.  If you do not have an appetite, do not force yourself to eat. However, you must continue to drink fluids.  If you have an appetite, eat a normal diet unless your caregiver tells you differently.  Eat a variety of complex carbohydrates (rice, wheat, potatoes, bread), lean meats, yogurt, fruits, and vegetables.  Avoid high-fat foods because they are more difficult to digest.  Drink enough water and fluids to keep your urine clear or pale yellow.  If you are dehydrated, ask your caregiver for specific rehydration instructions. Signs of dehydration may include:  Severe thirst.  Dry lips and mouth.  Dizziness.  Dark urine.  Decreasing urine frequency and amount.  Confusion.  Rapid breathing or pulse. SEEK IMMEDIATE MEDICAL CARE IF:   You have blood or brown flecks (like coffee grounds) in your vomit.  You have black or bloody stools.  You have a severe headache or stiff neck.  You are confused.  You have severe abdominal pain.  You have chest pain or trouble breathing.  You do not urinate at least once every 8 hours.  You develop cold or clammy skin.  You continue to vomit for longer than 24 to 48 hours.  You have a fever. MAKE SURE YOU:   Understand these instructions.  Will watch your condition.  Will get help right away if you are not  doing well or get worse. Document Released: 10/04/2005 Document Revised: 12/27/2011 Document Reviewed: 03/03/2011 [facility] Patient Information 2015 [place], [place]. This information is not intended to replace advice given to you by your health care provider.  Make sure you discuss any questions you have with your health care provider.   Urinary Tract Infection Urinary tract infections (UTIs) can develop anywhere along your urinary tract. Your urinary tract is your body's drainage system for removing wastes and extra water. Your urinary tract includes two kidneys, two ureters, a bladder, and a urethra. Your kidneys are a pair of bean-shaped organs. Each kidney is about the size of your fist. They are located below your ribs, one on each side of your spine. CAUSES Infections are caused by microbes, which are microscopic organisms, including fungi, viruses, and bacteria. These organisms are so small that they can only be seen through a microscope. Bacteria are the microbes that most commonly cause UTIs. SYMPTOMS  Symptoms of UTIs may vary by age and gender of the patient and by the location of the infection. Symptoms in young women typically include a frequent and intense urge to urinate and a painful, burning feeling in the bladder or urethra during urination. Older women and men are more likely to be tired, shaky, and weak and have muscle aches and abdominal pain. A fever may mean the infection is in your kidneys. Other symptoms of a kidney infection include pain in your back or sides below the ribs, nausea, and vomiting. DIAGNOSIS To diagnose a UTI, your caregiver will ask you about your symptoms. Your caregiver also will ask to provide a urine sample. The urine sample will be tested for bacteria and white blood cells. White blood cells are made by your body to help fight infection. TREATMENT  Typically, UTIs can be treated with medication. Because most UTIs are caused by a bacterial infection, they usually can be treated with the use of antibiotics. The choice of antibiotic and length of treatment depend on your symptoms and the type of bacteria causing your infection. HOME CARE INSTRUCTIONS  If you were prescribed antibiotics, take them exactly as your  caregiver instructs you. Finish the medication even if you feel better after you have only taken some of the medication.  Drink enough water and fluids to keep your urine clear or pale yellow.  Avoid caffeine, tea, and carbonated beverages. They tend to irritate your bladder.  Empty your bladder often. Avoid holding urine for long periods of time.  Empty your bladder before and after sexual intercourse.  After a bowel movement, women should cleanse from front to back. Use each tissue only once. SEEK MEDICAL CARE IF:   You have back pain.  You develop a fever.  Your symptoms do not begin to resolve within 3 days. SEEK IMMEDIATE MEDICAL CARE IF:   You have severe back pain or lower abdominal pain.  You develop chills.  You have nausea or vomiting.  You have continued burning or discomfort with urination. MAKE SURE YOU:   Understand these instructions.  Will watch your condition.  Will get help right away if you are not doing well or get worse. Document Released: 07/14/2005 Document Revised: 04/04/2012 Document Reviewed: 11/12/2011 [facility] Patient Information 2015 [place], [place]. This information is not intended to replace advice given to you by your health care provider. Make sure you discuss any questions you have with your health care provider.

## 2014-12-23 ENCOUNTER — Other Ambulatory Visit: Payer: Self-pay | Admitting: Internal Medicine

## 2014-12-23 MED ORDER — CEPHALEXIN 500 MG PO CAPS: 500.0000 mg | ORAL_CAPSULE | Freq: Three times a day (TID) | ORAL | Status: AC

## 2014-12-23 MED ORDER — CEPHALEXIN 500 MG PO CAPS: 500.0000 mg | ORAL_CAPSULE | Freq: Three times a day (TID) | ORAL | Status: DC

## 2015-01-02 ENCOUNTER — Ambulatory Visit: Payer: BC Managed Care – PPO | Admitting: Cardiology

## 2015-01-20 ENCOUNTER — Encounter: Payer: Self-pay | Admitting: Internal Medicine

## 2015-01-20 ENCOUNTER — Ambulatory Visit (INDEPENDENT_AMBULATORY_CARE_PROVIDER_SITE_OTHER): Payer: BC Managed Care – PPO | Admitting: Physician Assistant

## 2015-01-20 VITALS — BP 126/74 | HR 80 | Temp 97.1°F | Resp 16 | Ht 62.0 in | Wt 195.6 lb

## 2015-01-20 DIAGNOSIS — N3000 Acute cystitis without hematuria: Secondary | ICD-10-CM

## 2015-01-20 DIAGNOSIS — E876 Hypokalemia: Secondary | ICD-10-CM

## 2015-01-20 DIAGNOSIS — Z79899 Other long term (current) drug therapy: Secondary | ICD-10-CM

## 2015-01-20 LAB — HEPATIC FUNCTION PANEL
ALT: 12 U/L (ref 0–35)
AST: 15 U/L (ref 0–37)
Albumin: 4.3 g/dL (ref 3.5–5.2)
Alkaline Phosphatase: 83 U/L (ref 39–117)
BILIRUBIN INDIRECT: 0.2 mg/dL (ref 0.2–1.2)
Bilirubin, Direct: 0.1 mg/dL (ref 0.0–0.3)
TOTAL PROTEIN: 7.3 g/dL (ref 6.0–8.3)
Total Bilirubin: 0.3 mg/dL (ref 0.2–1.2)

## 2015-01-20 LAB — CBC WITH DIFFERENTIAL/PLATELET
Basophils Absolute: 0.1 10*3/uL (ref 0.0–0.1)
Basophils Relative: 1 % (ref 0–1)
Eosinophils Absolute: 0.4 10*3/uL (ref 0.0–0.7)
Eosinophils Relative: 7 % — ABNORMAL HIGH (ref 0–5)
HEMATOCRIT: 43.1 % (ref 36.0–46.0)
HEMOGLOBIN: 14.3 g/dL (ref 12.0–15.0)
LYMPHS ABS: 1.7 10*3/uL (ref 0.7–4.0)
Lymphocytes Relative: 30 % (ref 12–46)
MCH: 30.3 pg (ref 26.0–34.0)
MCHC: 33.2 g/dL (ref 30.0–36.0)
MCV: 91.3 fL (ref 78.0–100.0)
MONO ABS: 0.6 10*3/uL (ref 0.1–1.0)
MONOS PCT: 11 % (ref 3–12)
MPV: 9.8 fL (ref 8.6–12.4)
Neutro Abs: 2.9 10*3/uL (ref 1.7–7.7)
Neutrophils Relative %: 51 % (ref 43–77)
Platelets: 318 10*3/uL (ref 150–400)
RBC: 4.72 MIL/uL (ref 3.87–5.11)
RDW: 14.9 % (ref 11.5–15.5)
WBC: 5.6 10*3/uL (ref 4.0–10.5)

## 2015-01-20 LAB — BASIC METABOLIC PANEL WITH GFR
BUN: 10 mg/dL (ref 6–23)
CALCIUM: 9.7 mg/dL (ref 8.4–10.5)
CHLORIDE: 101 meq/L (ref 96–112)
CO2: 27 mEq/L (ref 19–32)
CREATININE: 0.81 mg/dL (ref 0.50–1.10)
GFR, Est African American: >89 mL/min
GFR, Est Non African American: 80 mL/min
Glucose, Bld: 73 mg/dL (ref 70–99)
Potassium: 4.3 mEq/L (ref 3.5–5.3)
Sodium: 140 mEq/L (ref 135–145)

## 2015-01-20 MED ORDER — DILTIAZEM HCL ER COATED BEADS 180 MG PO CP24: 180.0000 mg | ORAL_CAPSULE | Freq: Every day | ORAL | Status: DC

## 2015-01-20 NOTE — Progress Notes (Signed)
Assessment and Plan: 1. Acute cystitis without hematuria - Urinalysis, Routine w reflex microscopic - Urine culture  2. Hypokalemia - BASIC METABOLIC PANEL WITH GFR  3. Medication management - CBC with Differential/Platelet - Hepatic function panel   Future Appointments Date Time Provider [place]  02/13/2015 9:00 AM Dorothy Spark, MD [facility] None  03/25/2015 4:30 PM Vicie Mutters, PA-C [facility] None  04/28/2015 10:00 AM Melissa Tamala Julian, PA-C [facility] None    HPI 59 y.o.female presents for follow up for nausea/vomiting and possible UTI. She was seen on 12/19/2013 for nausea and vomiting, had normal AB, given cipro to cover for possible infection. She is feeling better, no more diarrhea, vomiting. She had a low potassium has been increasing greens and bananas, not on a potassium pill, she is on fluid pill for BP.   Past Medical History  Diagnosis Date  . MRSA (methicillin resistant staph aureus) culture positive   . Eczema   . Hypercholesteremia   . Hypertension   . Kidney stones   . Anemia   . Vitamin D deficiency   . A-fib      Allergies  Allergen Reactions  . Penicillins Other (See Comments)    DOESN'T REMEMBER     Current Outpatient Prescriptions on File Prior to Visit  Medication Sig Dispense Refill  . ascorbic acid (VITAMIN C) 1000 MG tablet Take 1,000 mg by mouth daily.    Marland Kitchen aspirin EC 81 MG tablet Take 81 mg by mouth daily.    Marland Kitchen atenolol (TENORMIN) 100 MG tablet Take 1 tablet (100 mg total) by mouth daily. Take 1/2-1 pill daily AD 90 tablet 0  . atorvastatin (LIPITOR) 80 MG tablet Take 26.67 mg by mouth See admin instructions. Take 1/3 tablet by mouth three times a week on Tuesday, thursday, and saturday    . Cholecalciferol (VITAMIN D PO) Take 4,000 Units by mouth daily.     Marland Kitchen diltiazem (CARDIZEM CD) 180 MG 24 hr capsule Take 1 capsule (180 mg total) by mouth daily. 90 capsule 3  . doxepin (SINEQUAN) 75 MG capsule take 1 capsule by mouth once  daily at bedtime 30 capsule 5  . Iron Combinations (IRON COMPLEX PO) Take 1 tablet by mouth daily.    . Olopatadine HCl (PATADAY) 0.2 % SOLN Apply 1 drop to eye daily.    . potassium chloride SA (K-DUR,KLOR-CON) 20 MEQ tablet Take 1 tablet (20 mEq total) by mouth 2 (two) times daily. 180 tablet 1  . triamterene-hydrochlorothiazide (MAXZIDE-25) 37.5-25 MG per tablet Take 1 tablet by mouth daily.     No current facility-administered medications on file prior to visit.    ROS: all negative except above.   Physical Exam: Filed Weights   01/20/15 1710  Weight: 195 lb 9.6 oz (88.724 kg)   BP 126/74 mmHg  Pulse 80  Temp(Src) 97.1 F (36.2 C)  Resp 16  Ht 5\' 2"  (1.575 m)  Wt 195 lb 9.6 oz (88.724 kg)  BMI 35.77 kg/m2 General Appearance: Well nourished, in no apparent distress. Eyes: PERRLA, EOMs, conjunctiva no swelling or erythema Sinuses: No Frontal/maxillary tenderness ENT/Mouth: Ext aud canals clear, TMs without erythema, bulging. No erythema, swelling, or exudate on post pharynx.  Tonsils not swollen or erythematous. Hearing normal.  Neck: Supple, thyroid normal.  Respiratory: Respiratory effort normal, BS equal bilaterally without rales, rhonchi, wheezing or stridor.  Cardio: RRR with no MRGs. Brisk peripheral pulses without edema.  Abdomen: Soft, + BS.  Non tender, no guarding, rebound, hernias, masses.  Lymphatics: Non tender without lymphadenopathy.  Musculoskeletal: Full ROM, 5/5 strength, normal gait.  Skin: Warm, dry without rashes, lesions, ecchymosis.  Neuro: Cranial nerves intact. Normal muscle tone, no cerebellar symptoms. Sensation intact.  Psych: Awake and oriented X 3, normal affect, Insight and Judgment appropriate.     Vicie Mutters, PA-C 5:16 PM [facility]

## 2015-01-20 NOTE — Patient Instructions (Signed)
Potassium Content of Foods  The body needs potassium to control blood pressure and to keep the muscles and nervous system healthy. Here are some healthy foods below that are high in potassium. Also you can get the white label salt of "NO SALT" salt substitute, 1/4 teaspoon of this is equivalent to 20meq potassium.   FOODS AND DRINKS HIGH IN POTASSIUM FOODS MODERATE IN POTASSIUM   Fruits Avocado (cubed),  c / 50 g. Banana (sliced), 75 g. Cantaloupe (cubed), 80 g. Honeydew, 1 wedge / 85 g. Kiwi (sliced), 90 g. Nectarine, 1 small / 129 g. Orange, 1 medium / 131 g. Vegetables Artichoke,  of a medium / 64 g. Asparagus (boiled), 90 g.. Broccoli (boiled), 78 g. Brussels sprout (boiled), 78 g. Butternut squash (baked), 103 g. Chickpea (cooked), 82 g. Green peas (cooked), 80 g. Kidney beans (cooked), 5 tbsp / 55 g. Lima beans (cooked),  c / 43 g. Navy beans (cooked),  c / 61 g. Spinach (cooked),  c / 45 g. Sweet potato (baked),  c / 50 g. Tomato (chopped or sliced), 90 g. Vegetable juice. White mushrooms (cooked), 78 g. Yam (cooked or baked),  c / 34 g. Zucchini squash (boiled), 90 g. Other Foods and Drinks Almonds (whole),  c / 36 g. Fish, 3 oz / 85 g. Nonfat fruit variety yogurt, 123 g. Pistachio nuts, 1 oz / 28 g. Pumpkin seeds, 1 oz / 28 g. Red meat (broiled, cooked, grilled), 3 oz / 85 g. Scallops (steamed), 3 oz / 85 g. Spaghetti sauce,  c / 66 g. Sunflower seeds (dry roasted), 1 oz / 28 g. Veggie burger, 1 patty / 70 g. Fruits Grapefruit,  of the fruit / 123 g Plums (sliced), 83 g. Tangerine, 1 large / 120 g. Vegetables Carrots (boiled), 78 g. Carrots (sliced), 61 g. Rhubarb (cooked with sugar), 120 g. Rutabaga (cooked), 120 g. Yellow snap beans (cooked), 63 g. Other Foods and Drinks  Chicken breast (roasted and chopped),  c / 70 g. Pita bread, 1 large / 64 g. Shrimp (steamed), 4 oz / 113 g. Swiss cheese (diced), 70 g.     

## 2015-01-21 LAB — URINALYSIS, ROUTINE W REFLEX MICROSCOPIC
Bilirubin Urine: NEGATIVE
Glucose, UA: NEGATIVE mg/dL
KETONES UR: NEGATIVE mg/dL
NITRITE: NEGATIVE
PH: 6.5 (ref 5.0–8.0)
Protein, ur: NEGATIVE mg/dL
Specific Gravity, Urine: 1.011 (ref 1.005–1.030)
Urobilinogen, UA: 0.2 mg/dL (ref 0.0–1.0)

## 2015-01-21 LAB — URINALYSIS, MICROSCOPIC ONLY
Bacteria, UA: NONE SEEN
CRYSTALS: NONE SEEN
Casts: NONE SEEN

## 2015-01-22 LAB — URINE CULTURE

## 2015-01-22 MED ORDER — CEPHALEXIN 250 MG PO CAPS: 250.0000 mg | ORAL_CAPSULE | Freq: Three times a day (TID) | ORAL | Status: AC

## 2015-01-22 NOTE — Addendum Note (Signed)
Addended by: Vicie Mutters R on: 01/22/2015 09:07 AM   Modules accepted: Orders

## 2015-02-04 ENCOUNTER — Other Ambulatory Visit: Payer: Self-pay | Admitting: Internal Medicine

## 2015-02-10 ENCOUNTER — Other Ambulatory Visit: Payer: Self-pay | Admitting: *Deleted

## 2015-02-13 ENCOUNTER — Ambulatory Visit (INDEPENDENT_AMBULATORY_CARE_PROVIDER_SITE_OTHER): Payer: BC Managed Care – PPO | Admitting: Cardiology

## 2015-02-13 ENCOUNTER — Encounter: Payer: Self-pay | Admitting: *Deleted

## 2015-02-13 ENCOUNTER — Encounter: Payer: Self-pay | Admitting: Cardiology

## 2015-02-13 VITALS — BP 130/68 | HR 77 | Ht 62.0 in | Wt 198.8 lb

## 2015-02-13 DIAGNOSIS — R0609 Other forms of dyspnea: Secondary | ICD-10-CM | POA: Diagnosis not present

## 2015-02-13 DIAGNOSIS — R0600 Dyspnea, unspecified: Secondary | ICD-10-CM

## 2015-02-13 DIAGNOSIS — E785 Hyperlipidemia, unspecified: Secondary | ICD-10-CM | POA: Diagnosis not present

## 2015-02-13 DIAGNOSIS — I480 Paroxysmal atrial fibrillation: Secondary | ICD-10-CM

## 2015-02-13 DIAGNOSIS — I10 Essential (primary) hypertension: Secondary | ICD-10-CM

## 2015-02-13 MED ORDER — TRIAMTERENE-HCTZ 37.5-25 MG PO TABS: 1.0000 | ORAL_TABLET | Freq: Every day | ORAL | Status: DC

## 2015-02-13 MED ORDER — DILTIAZEM HCL ER COATED BEADS 180 MG PO CP24: 180.0000 mg | ORAL_CAPSULE | Freq: Every day | ORAL | Status: DC

## 2015-02-13 MED ORDER — POTASSIUM CHLORIDE CRYS ER 20 MEQ PO TBCR
20.0000 meq | EXTENDED_RELEASE_TABLET | Freq: Two times a day (BID) | ORAL | Status: DC
Start: 2015-02-13 — End: 2015-12-17

## 2015-02-13 NOTE — Patient Instructions (Signed)
Your physician recommends that you continue on your current medications as directed. Please refer to the Current Medication list given to you today.   WE REFILLED YOUR CARDIAC MEDS FOR YOU    Your physician wants you to follow-up in: [place] will receive a reminder letter in the mail two months in advance. If you don't receive a letter, please call our office to schedule the follow-up appointment.

## 2015-02-13 NOTE — Progress Notes (Signed)
Patient ID: FAIGY STRETCH, female   DOB: 10/13/1956, 59 y.o.   MRN: [id number] Patient ID: MAGNOLIA MATTILA, female   DOB: 25-Feb-1956, 59 y.o.   MRN: [id number]     Patient Name: [name] Date of Encounter: 02/13/2015  Primary Care Provider:  Alesia Richards, MD Primary Cardiologist:  Dorothy Spark   Problem List   Past Medical History  Diagnosis Date  . MRSA (methicillin resistant staph aureus) culture positive   . Eczema   . Hypercholesteremia   . Hypertension   . Kidney stones   . Anemia   . Vitamin D deficiency   . A-fib    Past Surgical History  Procedure Laterality Date  . Breast surgery Left     biopsy-neg    Allergies  Allergies  Allergen Reactions  . Penicillins Other (See Comments)    DOESN'T REMEMBER    HPI   pleasant 59 year old female severe case of eczema , well-controlled hypertension hyperlipidemia was recently seen in the ER for fatigue and dyspnea on exertion.  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 No prior syncope.  She was diagnosed with atrial fibrillation with RVR that was symptomatic about a years ago. She was started on diltiazem and aspirin. No LE edema today, but at the end of the day. No orthopnea or PND. No palpitations since the last episode of a-fib.    Her stress test was normal, no prior infarct and no ischemia, however she did have a hypertensive response to the stress part of the test so diltiazem increased to CD 180 mg po daily, this is 3 months follow up. She feels better, no CP, dyspnea on moderate exertion. NO palpitations or syncope.  Home Medications  Prior to Admission medications   Medication Sig Start Date End Date Taking? Authorizing Provider  ascorbic acid (VITAMIN C) 1000 MG tablet Take 1,000 mg by mouth daily.    Historical Provider, MD  aspirin EC 81 MG tablet Take 81 mg by mouth daily.    Historical Provider, MD    atenolol (TENORMIN) 100 MG tablet Take 50 mg by mouth daily.    Historical Provider, MD  atorvastatin (LIPITOR) 80 MG tablet Take 26.67 mg by mouth See admin instructions. Take 1/3 tablet by mouth three times a week on Tuesday, thursday, and saturday    Historical Provider, MD  Cholecalciferol (VITAMIN D PO) Take 4,000 Units by mouth daily.     Historical Provider, MD  ciprofloxacin (CIPRO) 250 MG tablet Take 1 tablet (250 mg total) by mouth 2 (two) times daily. 09/09/14   Melissa R Smith, PA-C  diltiazem (TIAZAC) 120 MG 24 hr capsule Take 1 capsule (120 mg total) by mouth daily. 06/17/14   Unk Pinto, MD  doxepin (SINEQUAN) 75 MG capsule Take 75 mg by mouth at bedtime.    Historical Provider, MD  Iron Combinations (IRON COMPLEX PO) Take 1 tablet by mouth daily.    Historical Provider, MD  nitrofurantoin, macrocrystal-monohydrate, (MACROBID) 100 MG capsule Take 1 capsule (100 mg total) by mouth 2 (two) times daily. 08/12/14   Threasa Beards, MD  Olopatadine HCl (PATADAY) 0.2 % SOLN Apply 1 drop to eye daily.    Historical Provider, MD  potassium chloride SA (K-DUR,KLOR-CON) 20 MEQ tablet Take 20 mEq by mouth 2 (two) times daily.    Historical Provider, MD  triamterene-hydrochlorothiazide (MAXZIDE-25) 37.5-25 MG per tablet Take 1 tablet by  mouth daily.    Historical Provider, MD    Family History  Family History  Problem Relation Age of Onset  . Hypertension Mother   . Hepatitis C Mother   . Heart disease Father   . Dementia Father     Social History  History   Social History  . Marital Status: Widowed    Spouse Name: N/A  . Number of Children: N/A  . Years of Education: N/A   Occupational History  . Not on file.   Social History Main Topics  . Smoking status: Never Smoker   . Smokeless tobacco: Not on file  . Alcohol Use: No  . Drug Use: No  . Sexual Activity: Not on file   Other Topics Concern  . Not on file   Social History Narrative     Review of Systems, as  per HPI, otherwise negative General:  No chills, fever, night sweats or weight changes.  Cardiovascular:  No chest pain, dyspnea on exertion, edema, orthopnea, palpitations, paroxysmal nocturnal dyspnea. Dermatological: No rash, lesions/masses Respiratory: No cough, dyspnea Urologic: No hematuria, dysuria Abdominal:   No nausea, vomiting, diarrhea, bright red blood per rectum, melena, or hematemesis Neurologic:  No visual changes, wkns, changes in mental status. All other systems reviewed and are otherwise negative except as noted above.  Physical Exam  128/80, HR 70, 199 lbs General: Pleasant, NAD Psych: Normal affect. Neuro: Alert and oriented X 3. Moves all extremities spontaneously. HEENT: Normal  Neck: Supple without bruits or JVD. Lungs:  Resp regular and unlabored, CTA. Heart: RRR no s3, s4, or murmurs. Abdomen: Soft, non-tender, non-distended, BS + x 4.  Extremities: No clubbing, cyanosis or edema. DP/PT/Radials 2+ and equal bilaterally.  Labs:  No results for input(s): CKTOTAL, CKMB, TROPONINI in the last 72 hours. Lab Results  Component Value Date   WBC 5.6 01/20/2015   HGB 14.3 01/20/2015   HCT 43.1 01/20/2015   MCV 91.3 01/20/2015   PLT 318 01/20/2015    No results found for: DDIMER Invalid input(s): POCBNP    Component Value Date/Time   NA 140 01/20/2015 1741   K 4.3 01/20/2015 1741   CL 101 01/20/2015 1741   CO2 27 01/20/2015 1741   GLUCOSE 73 01/20/2015 1741   BUN 10 01/20/2015 1741   CREATININE 0.81 01/20/2015 1741   CREATININE 0.84 08/12/2014 1030   CALCIUM 9.7 01/20/2015 1741   PROT 7.3 01/20/2015 1741   ALBUMIN 4.3 01/20/2015 1741   AST 15 01/20/2015 1741   ALT 12 01/20/2015 1741   ALKPHOS 83 01/20/2015 1741   BILITOT 0.3 01/20/2015 1741   GFRNONAA 80 01/20/2015 1741   GFRNONAA 75* 08/12/2014 1030   GFRAA >89 01/20/2015 1741   GFRAA 86* 08/12/2014 1030   Lab Results  Component Value Date   CHOL 184 12/05/2014   HDL 62 12/05/2014    LDLCALC 112* 12/05/2014   TRIG 48 12/05/2014    Accessory Clinical Findings  Exercise nuclear stress test: 10/07/2014 Impression Exercise Capacity: Poor exercise capacity. BP Response: Hypertensive blood pressure response. Clinical Symptoms: There is dyspnea. ECG Impression: Borderline ST changes; brief SVT in recovery. Comparison with Prior Nuclear Study: No previous nuclear study performed  Overall Impression: Normal stress nuclear study.  LV Ejection Fraction: 61%. LV Wall Motion: NL LV Function; NL Wall Motion  ECG -  SINUS RHYTHM WITH SINUS ARRHYTHMIA, NONSPECIFIC st-t WAVE ABNORMALITIES, ABNORMAL ekg.    Assessment & Plan  59 year old female  1. DOE - risk  factors include obesity, HTN, HLP, negative stress test was infarct or ischemia, hypertensive response to stress, increased cardizem CD to 180 mg po daily, feels better, encouraged to exercise daily.  2. HTN - now controlled  3. HLP - on lipitor 800 mg po daily, at goal  4. Paroxysmal atrial fibrillation - 1 episode, well controlled on cardizem, agree with ASA only since only 1 episode so far. CHADS-VASc 2  Follow up in 1 year.   Dorothy Spark, MD, [facility] 02/13/2015, 9:27 AM

## 2015-02-26 ENCOUNTER — Ambulatory Visit: Payer: BC Managed Care – PPO | Admitting: Cardiology

## 2015-03-25 ENCOUNTER — Ambulatory Visit: Payer: Self-pay | Admitting: Physician Assistant

## 2015-04-27 ENCOUNTER — Other Ambulatory Visit: Payer: Self-pay | Admitting: Internal Medicine

## 2015-04-28 ENCOUNTER — Encounter: Payer: Self-pay | Admitting: Internal Medicine

## 2015-04-28 ENCOUNTER — Ambulatory Visit (INDEPENDENT_AMBULATORY_CARE_PROVIDER_SITE_OTHER): Payer: BC Managed Care – PPO | Admitting: Internal Medicine

## 2015-04-28 ENCOUNTER — Encounter: Payer: Self-pay | Admitting: Emergency Medicine

## 2015-04-28 VITALS — BP 140/78 | HR 70 | Temp 98.0°F | Resp 18 | Ht 62.0 in | Wt 195.0 lb

## 2015-04-28 DIAGNOSIS — E782 Mixed hyperlipidemia: Secondary | ICD-10-CM

## 2015-04-28 DIAGNOSIS — L309 Dermatitis, unspecified: Secondary | ICD-10-CM

## 2015-04-28 DIAGNOSIS — I10 Essential (primary) hypertension: Secondary | ICD-10-CM

## 2015-04-28 DIAGNOSIS — I480 Paroxysmal atrial fibrillation: Secondary | ICD-10-CM

## 2015-04-28 DIAGNOSIS — R5383 Other fatigue: Secondary | ICD-10-CM

## 2015-04-28 DIAGNOSIS — Z79899 Other long term (current) drug therapy: Secondary | ICD-10-CM

## 2015-04-28 DIAGNOSIS — E559 Vitamin D deficiency, unspecified: Secondary | ICD-10-CM

## 2015-04-28 DIAGNOSIS — Z0001 Encounter for general adult medical examination with abnormal findings: Secondary | ICD-10-CM

## 2015-04-28 DIAGNOSIS — Z1212 Encounter for screening for malignant neoplasm of rectum: Secondary | ICD-10-CM

## 2015-04-28 DIAGNOSIS — R7303 Prediabetes: Secondary | ICD-10-CM

## 2015-04-28 DIAGNOSIS — E669 Obesity, unspecified: Secondary | ICD-10-CM

## 2015-04-28 DIAGNOSIS — R6889 Other general symptoms and signs: Secondary | ICD-10-CM

## 2015-04-28 LAB — BASIC METABOLIC PANEL WITH GFR
BUN: 11 mg/dL (ref 6–23)
CHLORIDE: 102 meq/L (ref 96–112)
CO2: 27 mEq/L (ref 19–32)
CREATININE: 0.78 mg/dL (ref 0.50–1.10)
Calcium: 9.7 mg/dL (ref 8.4–10.5)
GFR, EST NON AFRICAN AMERICAN: 83 mL/min
GLUCOSE: 82 mg/dL (ref 70–99)
Potassium: 3.9 mEq/L (ref 3.5–5.3)
Sodium: 142 mEq/L (ref 135–145)

## 2015-04-28 LAB — HEPATIC FUNCTION PANEL
ALT: 15 U/L (ref 0–35)
AST: 19 U/L (ref 0–37)
Albumin: 4.1 g/dL (ref 3.5–5.2)
Alkaline Phosphatase: 89 U/L (ref 39–117)
BILIRUBIN TOTAL: 0.6 mg/dL (ref 0.2–1.2)
Bilirubin, Direct: 0.1 mg/dL (ref 0.0–0.3)
Indirect Bilirubin: 0.5 mg/dL (ref 0.2–1.2)
Total Protein: 6.6 g/dL (ref 6.0–8.3)

## 2015-04-28 LAB — TSH: TSH: 1.062 u[IU]/mL (ref 0.350–4.500)

## 2015-04-28 LAB — IRON AND TIBC
%SAT: 24 % (ref 20–55)
Iron: 54 ug/dL (ref 42–145)
TIBC: 223 ug/dL — AB (ref 250–470)
UIBC: 169 ug/dL (ref 125–400)

## 2015-04-28 LAB — CBC WITH DIFFERENTIAL/PLATELET
BASOS ABS: 0 10*3/uL (ref 0.0–0.1)
Basophils Relative: 1 % (ref 0–1)
Eosinophils Absolute: 0.3 10*3/uL (ref 0.0–0.7)
Eosinophils Relative: 6 % — ABNORMAL HIGH (ref 0–5)
HCT: 40.9 % (ref 36.0–46.0)
Hemoglobin: 13.7 g/dL (ref 12.0–15.0)
Lymphocytes Relative: 26 % (ref 12–46)
Lymphs Abs: 1.3 10*3/uL (ref 0.7–4.0)
MCH: 30.4 pg (ref 26.0–34.0)
MCHC: 33.5 g/dL (ref 30.0–36.0)
MCV: 90.9 fL (ref 78.0–100.0)
MONO ABS: 0.6 10*3/uL (ref 0.1–1.0)
MPV: 9.3 fL (ref 8.6–12.4)
Monocytes Relative: 13 % — ABNORMAL HIGH (ref 3–12)
NEUTROS ABS: 2.6 10*3/uL (ref 1.7–7.7)
NEUTROS PCT: 54 % (ref 43–77)
Platelets: 285 10*3/uL (ref 150–400)
RBC: 4.5 MIL/uL (ref 3.87–5.11)
RDW: 14.8 % (ref 11.5–15.5)
WBC: 4.9 10*3/uL (ref 4.0–10.5)

## 2015-04-28 LAB — LIPID PANEL
Cholesterol: 157 mg/dL (ref 0–200)
HDL: 69 mg/dL (ref >=46)
LDL Cholesterol: 78 mg/dL (ref 0–99)
Total CHOL/HDL Ratio: 2.3 Ratio
Triglycerides: 49 mg/dL (ref <150)
VLDL: 10 mg/dL (ref 0–40)

## 2015-04-28 LAB — HEMOGLOBIN A1C
Hgb A1c MFr Bld: 5.7 % — ABNORMAL HIGH (ref <5.7)
Mean Plasma Glucose: 117 mg/dL — ABNORMAL HIGH (ref <117)

## 2015-04-28 LAB — MAGNESIUM: MAGNESIUM: 1.8 mg/dL (ref 1.5–2.5)

## 2015-04-28 LAB — VITAMIN B12: VITAMIN B 12: 509 pg/mL (ref 211–911)

## 2015-04-28 MED ORDER — DEXAMETHASONE SODIUM PHOSPHATE 100 MG/10ML IJ SOLN
10.0000 mg | Freq: Once | INTRAMUSCULAR | Status: AC
  Administered 2015-04-28: 10 mg via INTRAMUSCULAR

## 2015-04-28 NOTE — Patient Instructions (Signed)
Preventive Care for Adults  A healthy lifestyle and preventive care can promote health and wellness. Preventive health guidelines for women include the following key practices.  A routine yearly physical is a good way to check with your health care provider about your health and preventive screening. It is a chance to share any concerns and updates on your health and to receive a thorough exam.  Visit your dentist for a routine exam and preventive care every 6 months. Brush your teeth twice a day and floss once a day. Good oral hygiene prevents tooth decay and gum disease.  The frequency of eye exams is based on your age, health, family medical history, use of contact lenses, and other factors. Follow your health care provider's recommendations for frequency of eye exams.  Eat a healthy diet. Foods like vegetables, fruits, whole grains, low-fat dairy products, and lean protein foods contain the nutrients you need without too many calories. Decrease your intake of foods high in solid fats, added sugars, and salt. Eat the right amount of calories for you.Get information about a proper diet from your health care provider, if necessary.  Regular physical exercise is one of the most important things you can do for your health. Most adults should get at least 150 minutes of moderate-intensity exercise (any activity that increases your heart rate and causes you to sweat) each week. In addition, most adults need muscle-strengthening exercises on 2 or more days a week.  Maintain a healthy weight. The body mass index (BMI) is a screening tool to identify possible weight problems. It provides an estimate of body fat based on height and weight. Your health care provider can find your BMI and can help you achieve or maintain a healthy weight.For adults 20 years and older:  A BMI below 18.5 is considered underweight.  A BMI of 18.5 to 24.9 is normal.  A BMI of 25 to 29.9 is considered overweight.  A BMI  of 30 and above is considered obese.  Maintain normal blood lipids and cholesterol levels by exercising and minimizing your intake of saturated fat. Eat a balanced diet with plenty of fruit and vegetables. Blood tests for lipids and cholesterol should begin at age 58 and be repeated every 5 years. If your lipid or cholesterol levels are high, you are over 50, or you are at high risk for heart disease, you may need your cholesterol levels checked more frequently.Ongoing high lipid and cholesterol levels should be treated with medicines if diet and exercise are not working.  If you smoke, find out from your health care provider how to quit. If you do not use tobacco, do not start.  Lung cancer screening is recommended for adults aged 58-80 years who are at high risk for developing lung cancer because of a history of smoking. A yearly low-dose CT scan of the lungs is recommended for people who have at least a 30-pack-year history of smoking and are a current smoker or have quit within the past 15 years. A pack year of smoking is smoking an average of 1 pack of cigarettes a day for 1 year (for example: 1 pack a day for 30 years or 2 packs a day for 15 years). Yearly screening should continue until the smoker has stopped smoking for at least 15 years. Yearly screening should be stopped for people who develop a health problem that would prevent them from having lung cancer treatment.  High blood pressure causes heart disease and increases the risk of  stroke. Your blood pressure should be checked at least every 1 to 2 years. Ongoing high blood pressure should be treated with medicines if weight loss and exercise do not work.  If you are 55-79 years old, ask your health care provider if you should take aspirin to prevent strokes.  Diabetes screening involves taking a blood sample to check your fasting blood sugar level. This should be done once every 3 years, after age 45, if you are within normal weight and  without risk factors for diabetes. Testing should be considered at a younger age or be carried out more frequently if you are overweight and have at least 1 risk factor for diabetes.  Breast cancer screening is essential preventive care for women. You should practice "breast self-awareness." This means understanding the normal appearance and feel of your breasts and may include breast self-examination. Any changes detected, no matter how small, should be reported to a health care provider. Women in their 20s and 30s should have a clinical breast exam (CBE) by a health care provider as part of a regular health exam every 1 to 3 years. After age 40, women should have a CBE every year. Starting at age 40, women should consider having a mammogram (breast X-ray test) every year. Women who have a family history of breast cancer should talk to their health care provider about genetic screening. Women at a high risk of breast cancer should talk to their health care providers about having an MRI and a mammogram every year.  Breast cancer gene (BRCA)-related cancer risk assessment is recommended for women who have family members with BRCA-related cancers. BRCA-related cancers include breast, ovarian, tubal, and peritoneal cancers. Having family members with these cancers may be associated with an increased risk for harmful changes (mutations) in the breast cancer genes BRCA1 and BRCA2. Results of the assessment will determine the need for genetic counseling and BRCA1 and BRCA2 testing.  Routine pelvic exams to screen for cancer are no longer recommended for nonpregnant women who are considered low risk for cancer of the pelvic organs (ovaries, uterus, and vagina) and who do not have symptoms. Ask your health care provider if a screening pelvic exam is right for you.  If you have had past treatment for cervical cancer or a condition that could lead to cancer, you need Pap tests and screening for cancer for at least 20  years after your treatment. If Pap tests have been discontinued, your risk factors (such as having a new sexual partner) need to be reassessed to determine if screening should be resumed. Some women have medical problems that increase the chance of getting cervical cancer. In these cases, your health care provider may recommend more frequent screening and Pap tests.  Colorectal cancer can be detected and often prevented. Most routine colorectal cancer screening begins at the age of 50 years and continues through age 75 years. However, your health care provider may recommend screening at an earlier age if you have risk factors for colon cancer. On a yearly basis, your health care provider may provide home test kits to check for hidden blood in the stool. Use of a small camera at the end of a tube, to directly examine the colon (sigmoidoscopy or colonoscopy), can detect the earliest forms of colorectal cancer. Talk to your health care provider about this at age 50, when routine screening begins. Direct exam of the colon should be repeated every 5-10 years through age 75 years, unless early forms of pre-cancerous   polyps or small growths are found.  Hepatitis C blood testing is recommended for all people born from 1945 through 1965 and any individual with known risks for hepatitis C.  Pra  Osteoporosis is a disease in which the bones lose minerals and strength with aging. This can result in serious bone fractures or breaks. The risk of osteoporosis can be identified using a bone density scan. Women ages 65 years and over and women at risk for fractures or osteoporosis should discuss screening with their health care providers. Ask your health care provider whether you should take a calcium supplement or vitamin D to reduce the rate of osteoporosis.  Menopause can be associated with physical symptoms and risks. Hormone replacement therapy is available to decrease symptoms and risks. You should talk to your  health care provider about whether hormone replacement therapy is right for you.  Use sunscreen. Apply sunscreen liberally and repeatedly throughout the day. You should seek shade when your shadow is shorter than you. Protect yourself by wearing long sleeves, pants, a wide-brimmed hat, and sunglasses year round, whenever you are outdoors.  Once a month, do a whole body skin exam, using a mirror to look at the skin on your back. Tell your health care provider of new moles, moles that have irregular borders, moles that are larger than a pencil eraser, or moles that have changed in shape or color.  Stay current with required vaccines (immunizations).  Influenza vaccine. All adults should be immunized every year.  Tetanus, diphtheria, and acellular pertussis (Td, Tdap) vaccine. Pregnant women should receive 1 dose of Tdap vaccine during each pregnancy. The dose should be obtained regardless of the length of time since the last dose. Immunization is preferred during the 27th-36th week of gestation. An adult who has not previously received Tdap or who does not know her vaccine status should receive 1 dose of Tdap. This initial dose should be followed by tetanus and diphtheria toxoids (Td) booster doses every 10 years. Adults with an unknown or incomplete history of completing a 3-dose immunization series with Td-containing vaccines should begin or complete a primary immunization series including a Tdap dose. Adults should receive a Td booster every 10 years.  Varicella vaccine. An adult without evidence of immunity to varicella should receive 2 doses or a second dose if she has previously received 1 dose. Pregnant females who do not have evidence of immunity should receive the first dose after pregnancy. This first dose should be obtained before leaving the health care facility. The second dose should be obtained 4-8 weeks after the first dose.  Human papillomavirus (HPV) vaccine. Females aged 13-26 years  who have not received the vaccine previously should obtain the 3-dose series. The vaccine is not recommended for use in pregnant females. However, pregnancy testing is not needed before receiving a dose. If a female is found to be pregnant after receiving a dose, no treatment is needed. In that case, the remaining doses should be delayed until after the pregnancy. Immunization is recommended for any person with an immunocompromised condition through the age of 26 years if she did not get any or all doses earlier. During the 3-dose series, the second dose should be obtained 4-8 weeks after the first dose. The third dose should be obtained 24 weeks after the first dose and 16 weeks after the second dose.  Zoster vaccine. One dose is recommended for adults aged 60 years or older unless certain conditions are present.  Measles, mumps, and rubella (  MMR) vaccine. Adults born before [birth year] generally are considered immune to measles and mumps. Adults born in [birth year] or later should have 1 or more doses of MMR vaccine unless there is a contraindication to the vaccine or there is laboratory evidence of immunity to each of the three diseases. A routine second dose of MMR vaccine should be obtained at least 28 days after the first dose for students attending postsecondary schools, health care workers, or international travelers. People who received inactivated measles vaccine or an unknown type of measles vaccine during 1963-1967 should receive 2 doses of MMR vaccine. People who received inactivated mumps vaccine or an unknown type of mumps vaccine before 1979 and are at high risk for mumps infection should consider immunization with 2 doses of MMR vaccine. For females of childbearing age, rubella immunity should be determined. If there is no evidence of immunity, females who are not pregnant should be vaccinated. If there is no evidence of immunity, females who are pregnant should delay immunization until after pregnancy.  Unvaccinated health care workers born before [birth year] who lack laboratory evidence of measles, mumps, or rubella immunity or laboratory confirmation of disease should consider measles and mumps immunization with 2 doses of MMR vaccine or rubella immunization with 1 dose of MMR vaccine.  Pneumococcal 13-valent conjugate (PCV13) vaccine. When indicated, a person who is uncertain of her immunization history and has no record of immunization should receive the PCV13 vaccine. An adult aged 39 years or older who has certain medical conditions and has not been previously immunized should receive 1 dose of PCV13 vaccine. This PCV13 should be followed with a dose of pneumococcal polysaccharide (PPSV23) vaccine. The PPSV23 vaccine dose should be obtained at least 8 weeks after the dose of PCV13 vaccine. An adult aged 62 years or older who has certain medical conditions and previously received 1 or more doses of PPSV23 vaccine should receive 1 dose of PCV13. The PCV13 vaccine dose should be obtained 1 or more years after the last PPSV23 vaccine dose.    Pneumococcal polysaccharide (PPSV23) vaccine. When PCV13 is also indicated, PCV13 should be obtained first. All adults aged 67 years and older should be immunized. An adult younger than age 45 years who has certain medical conditions should be immunized. Any person who resides in a nursing home or long-term care facility should be immunized. An adult smoker should be immunized. People with an immunocompromised condition and certain other conditions should receive both PCV13 and PPSV23 vaccines. People with human immunodeficiency virus (HIV) infection should be immunized as soon as possible after diagnosis. Immunization during chemotherapy or radiation therapy should be avoided. Routine use of PPSV23 vaccine is not recommended for American Indians, [place] Natives, or people younger than 65 years unless there are medical conditions that require PPSV23 vaccine. When indicated,  people who have unknown immunization and have no record of immunization should receive PPSV23 vaccine. One-time revaccination 5 years after the first dose of PPSV23 is recommended for people aged 19-64 years who have chronic kidney failure, nephrotic syndrome, asplenia, or immunocompromised conditions. People who received 1-2 doses of PPSV23 before age 23 years should receive another dose of PPSV23 vaccine at age 35 years or later if at least 5 years have passed since the previous dose. Doses of PPSV23 are not needed for people immunized with PPSV23 at or after age 38 years.  Preventive Services / Frequency   Ages 43 to 86 years  Blood pressure check.  Lipid and cholesterol check.  Lung  cancer screening. / Every year if you are aged 55-80 years and have a 30-pack-year history of smoking and currently smoke or have quit within the past 15 years. Yearly screening is stopped once you have quit smoking for at least 15 years or develop a health problem that would prevent you from having lung cancer treatment.  Clinical breast exam.** / Every year after age 40 years.  BRCA-related cancer risk assessment.** / For women who have family members with a BRCA-related cancer (breast, ovarian, tubal, or peritoneal cancers).  Mammogram.** / Every year beginning at age 40 years and continuing for as long as you are in good health. Consult with your health care provider.  Pap test.** / Every 3 years starting at age 30 years through age 65 or 70 years with a history of 3 consecutive normal Pap tests.  HPV screening.** / Every 3 years from ages 30 years through ages 65 to 70 years with a history of 3 consecutive normal Pap tests.  Fecal occult blood test (FOBT) of stool. / Every year beginning at age 50 years and continuing until age 75 years. You may not need to do this test if you get a colonoscopy every 10 years.  Flexible sigmoidoscopy or colonoscopy.** / Every 5 years for a flexible sigmoidoscopy or  every 10 years for a colonoscopy beginning at age 50 years and continuing until age 75 years.  Hepatitis C blood test.** / For all people born from 1945 through 1965 and any individual with known risks for hepatitis C.  Skin self-exam. / Monthly.  Influenza vaccine. / Every year.  Tetanus, diphtheria, and acellular pertussis (Tdap/Td) vaccine.** / Consult your health care provider. Pregnant women should receive 1 dose of Tdap vaccine during each pregnancy. 1 dose of Td every 10 years.  Varicella vaccine.** / Consult your health care provider. Pregnant females who do not have evidence of immunity should receive the first dose after pregnancy.  Zoster vaccine.** / 1 dose for adults aged 60 years or older.  Pneumococcal 13-valent conjugate (PCV13) vaccine.** / Consult your health care provider.  Pneumococcal polysaccharide (PPSV23) vaccine.** / 1 to 2 doses if you smoke cigarettes or if you have certain conditions.  Meningococcal vaccine.** / Consult your health care provider.  Hepatitis A vaccine.** / Consult your health care provider.  Hepatitis B vaccine.** / Consult your health care provider. Screening for abdominal aortic aneurysm (AAA)  by ultrasound is recommended for people over 50 who have history of high blood pressure or who are current or former smokers.   

## 2015-04-28 NOTE — Progress Notes (Signed)
Patient ID: [name], female   DOB: 1955/10/21, 59 y.o.   MRN: [id number]  Complete Physical  Assessment and Plan:   1. Essential hypertension -cont meds - Urinalysis, Routine w reflex microscopic (not at [facility]) - Microalbumin / creatinine urine ratio - EKG 12-Lead - [place], RETROPERITNL ABD,  LTD - TSH  2. Paroxysmal atrial fibrillation -cont diltiazem  3. Prediabetes  - Hemoglobin A1c - Insulin, random  4. Mixed hyperlipidemia -diet and exercise -cont meds - Lipid panel  5. Vitamin D deficiency -cont supplement - Vit D  25 hydroxy (rtn osteoporosis monitoring)  6. Obesity -diet and exercise -weight loss  7. Medication management  - CBC with Differential/Platelet - BASIC METABOLIC PANEL WITH GFR - Hepatic function panel - Magnesium  8. Other fatigue  - Iron and TIBC - Vitamin B12  9. Screening for rectal cancer -cologuard info given - POC Hemoccult Bld/Stl (3-Cd Home Screen); Future  10. Eczema -decadron shot -follow-up with derm -cont kenalog   Discussed med's effects and SE's. Screening labs and tests as requested with regular follow-up as recommended.  HPI  59 y.o. female  presents for a complete physical.  Her blood pressure has been controlled at home, today their BP is BP: 140/78 mmHg.  She does workout.  She reports that she has been walking 30 minutes per day.  She reports that she has seen Dr. Meda Coffee and she had a negative stress test and  She denies chest pain, shortness of breath, dizziness.   She is on cholesterol medication and denies myalgias. Her cholesterol is not at goal. The cholesterol last visit was:  Lab Results  Component Value Date   CHOL 184 12/05/2014   HDL 62 12/05/2014   LDLCALC 112* 12/05/2014   TRIG 48 12/05/2014   CHOLHDL 3.0 12/05/2014  .  She has been working on diet and exercise for prediabetes, she is on bASA, she is not on ACE/ARB and denies foot ulcerations, hyperglycemia, hypoglycemia , increased  appetite, nausea, paresthesia of the feet, polydipsia, polyuria, visual disturbances, vomiting and weight loss. Last A1C in the office was:  Lab Results  Component Value Date   HGBA1C 5.8* 12/05/2014    Patient is on Vitamin D supplement.   Lab Results  Component Value Date   VD25OH 44 12/05/2014     She does report that she is tired and has some achiness all over.   Current Medications:  Current Outpatient Prescriptions on File Prior to Visit  Medication Sig Dispense Refill  . ascorbic acid (VITAMIN C) 1000 MG tablet Take 1,000 mg by mouth daily.    Marland Kitchen aspirin EC 81 MG tablet Take 81 mg by mouth daily.    Marland Kitchen atenolol (TENORMIN) 100 MG tablet Take 1 tablet (100 mg total) by mouth daily. Take 1/2-1 pill daily AD 90 tablet 0  . atorvastatin (LIPITOR) 80 MG tablet Take 26.67 mg by mouth See admin instructions. Take 1/3 tablet by mouth three times a week on Tuesday, thursday, and saturday    . Cholecalciferol (VITAMIN D PO) Take 4,000 Units by mouth daily.     Marland Kitchen diltiazem (CARDIZEM CD) 180 MG 24 hr capsule Take 1 capsule (180 mg total) by mouth daily. 90 capsule 6  . doxepin (SINEQUAN) 75 MG capsule take 1 capsule by mouth once daily at bedtime 30 capsule 5  . Iron Combinations (IRON COMPLEX PO) Take 1 tablet by mouth daily.    . Olopatadine HCl (PATADAY) 0.2 % SOLN Apply 1 drop to  eye daily.    . potassium chloride SA (K-DUR,KLOR-CON) 20 MEQ tablet Take 1 tablet (20 mEq total) by mouth 2 (two) times daily. 180 tablet 6  . triamcinolone cream (KENALOG) 0.1 % Apply 1 application topically as directed.     . triamterene-hydrochlorothiazide (MAXZIDE-25) 37.5-25 MG per tablet Take 1 tablet by mouth daily. 30 tablet 6   No current facility-administered medications on file prior to visit.    Health Maintenance:   Immunization History  Administered Date(s) Administered  . Influenza Split 07/13/2013  . PPD Test 04/25/2014  . Tdap 04/25/2013    Patient given cologuard information and was  told that she needs to have that or a colonoscopy.     Patient Care Team: Unk Pinto, MD as PCP - General (Internal Medicine) Warden Fillers, MD as Consulting Physician (Optometry) Kathie Rhodes, MD as Consulting Physician (Urology) Danella Sensing, MD as Consulting Physician (Dermatology)  Allergies:  Allergies  Allergen Reactions  . Penicillins Other (See Comments)    DOESN'T REMEMBER    Medical History:  Past Medical History  Diagnosis Date  . MRSA (methicillin resistant staph aureus) culture positive   . Eczema   . Hypercholesteremia   . Hypertension   . Kidney stones   . Anemia   . Vitamin D deficiency   . A-fib     Surgical History:  Past Surgical History  Procedure Laterality Date  . Breast surgery Left     biopsy-neg    Family History:  Family History  Problem Relation Age of Onset  . Hypertension Mother   . Hepatitis C Mother   . Heart disease Father   . Dementia Father     Social History:  History  Substance Use Topics  . Smoking status: Never Smoker   . Smokeless tobacco: Not on file  . Alcohol Use: No    Review of Systems: Review of Systems  Constitutional: Positive for malaise/fatigue. Negative for fever and chills.  HENT: Negative for congestion, ear pain and sore throat.   Eyes: Negative.   Respiratory: Negative for cough, shortness of breath and wheezing.   Cardiovascular: Negative for chest pain, palpitations and leg swelling.  Gastrointestinal: Negative for heartburn, nausea, vomiting, diarrhea, constipation, blood in stool and melena.  Genitourinary: Negative.  Negative for dysuria, urgency and frequency.  Skin: Positive for itching and rash.       Eczema has been out of control.    Neurological: Negative for dizziness, loss of consciousness and headaches.  Psychiatric/Behavioral: Negative for depression. The patient is not nervous/anxious and does not have insomnia.     Physical Exam: Estimated body mass index is 35.66  kg/(m^2) as calculated from the following:   Height as of this encounter: 5\' 2"  (1.575 m).   Weight as of this encounter: 195 lb (88.451 kg). BP 140/78 mmHg  Pulse 70  Temp(Src) 98 F (36.7 C) (Temporal)  Resp 18  Ht 5\' 2"  (1.575 m)  Wt 195 lb (88.451 kg)  BMI 35.66 kg/m2  General Appearance: Well nourished well developed, in no apparent distress.  Eyes: PERRLA, EOMs, conjunctiva no swelling or erythema ENT/Mouth: Ear canals normal without obstruction, swelling, erythema, or discharge.  TMs normal bilaterally with no erythema, bulging, retraction, or loss of landmark.  Oropharynx moist and clear with no exudate, erythema, or swelling.   Neck: Supple, thyroid normal. No bruits.  No cervical adenopathy Respiratory: Respiratory effort normal, Breath sounds clear A&P without wheeze, rhonchi, rales.   Cardio: RRR without murmurs, rubs or  gallops. Brisk peripheral pulses without edema.  Chest: symmetric, with normal excursions Breasts: Symmetric, without lumps, nipple discharge, retractions.  Abdomen: Soft, nontender, no guarding, rebound, hernias, masses, or organomegaly.  Lymphatics: Non tender without lymphadenopathy.  Genitourinary:  Musculoskeletal: Full ROM all peripheral extremities,5/5 strength, and normal gait.  Skin: Severe eczema across scalp, bilateral hands, chest, shoulders and armpits, and groin.  Warm, dry without rashes, lesions, ecchymosis. Neuro: Awake and oriented X 3, Cranial nerves intact, reflexes equal bilaterally. Normal muscle tone, no cerebellar symptoms. Sensation intact.  Psych:  normal affect, Insight and Judgment appropriate.   EKG: WNL no changes.  AORTA SCAN: one normal scan and and one abnormal. No history of abnormal may be gas bubble/stool.  Will continue to monitor here.    Over 40 minutes of exam, counseling, chart review and critical decision making was performed  Loma Sousa Forcucci 10:20 AM [facility]

## 2015-04-29 ENCOUNTER — Other Ambulatory Visit: Payer: Self-pay | Admitting: Internal Medicine

## 2015-04-29 DIAGNOSIS — R319 Hematuria, unspecified: Secondary | ICD-10-CM

## 2015-04-29 LAB — URINALYSIS, ROUTINE W REFLEX MICROSCOPIC
Bilirubin Urine: NEGATIVE
GLUCOSE, UA: NEGATIVE mg/dL
HGB URINE DIPSTICK: NEGATIVE
Ketones, ur: NEGATIVE mg/dL
Nitrite: NEGATIVE
PH: 5.5 (ref 5.0–8.0)
PROTEIN: 30 mg/dL — AB
SPECIFIC GRAVITY, URINE: 1.017 (ref 1.005–1.030)
Urobilinogen, UA: 0.2 mg/dL (ref 0.0–1.0)

## 2015-04-29 LAB — URINALYSIS, MICROSCOPIC ONLY
Bacteria, UA: NONE SEEN
CASTS: NONE SEEN
CRYSTALS: NONE SEEN
SQUAMOUS EPITHELIAL / LPF: NONE SEEN

## 2015-04-29 LAB — MICROALBUMIN / CREATININE URINE RATIO
CREATININE, URINE: 163.3 mg/dL
Microalb Creat Ratio: 72.9 mg/g — ABNORMAL HIGH (ref 0.0–30.0)
Microalb, Ur: 11.9 mg/dL — ABNORMAL HIGH (ref <2.0)

## 2015-04-29 LAB — INSULIN, RANDOM: INSULIN: 3.8 u[IU]/mL (ref 2.0–19.6)

## 2015-04-29 LAB — VITAMIN D 25 HYDROXY (VIT D DEFICIENCY, FRACTURES): VIT D 25 HYDROXY: 53 ng/mL (ref 30–100)

## 2015-04-30 ENCOUNTER — Ambulatory Visit (INDEPENDENT_AMBULATORY_CARE_PROVIDER_SITE_OTHER): Payer: BC Managed Care – PPO | Admitting: Internal Medicine

## 2015-04-30 VITALS — BP 138/72 | HR 72 | Temp 98.0°F | Resp 16 | Ht 62.0 in

## 2015-04-30 DIAGNOSIS — F05 Delirium due to known physiological condition: Secondary | ICD-10-CM

## 2015-04-30 DIAGNOSIS — R410 Disorientation, unspecified: Secondary | ICD-10-CM

## 2015-04-30 DIAGNOSIS — R300 Dysuria: Secondary | ICD-10-CM

## 2015-04-30 MED ORDER — CIPROFLOXACIN HCL 500 MG PO TABS: 500.0000 mg | ORAL_TABLET | Freq: Two times a day (BID) | ORAL | Status: AC

## 2015-04-30 NOTE — Progress Notes (Signed)
   Subjective:    Patient ID: [name], female    DOB: July 10, 1956, 59 y.o.   MRN: [id number]  HPI  She reports that she has been having a lot of shaking and not sleeping well.  Her daughter is with her today and states that she is out of her mind and not acting like herself.  She reports that she has been using a pad and has had a clear discharge but does not know if this is urine or not.  She reports a significant history of kidney stones and UTI.  Her daughter reports that this behavior is similar to when she had a UTI in the past.   Review of Systems  Constitutional: Positive for fever, chills, diaphoresis and fatigue.  Gastrointestinal: Positive for nausea. Negative for vomiting and abdominal pain.  Genitourinary: Positive for urgency and frequency. Negative for dysuria and hematuria.  Musculoskeletal: Positive for back pain.       Objective:   Physical Exam  Constitutional: She is oriented to person, place, and time. She appears well-developed and well-nourished. No distress.  HENT:  Head: Normocephalic.  Mouth/Throat: Oropharynx is clear and moist. No oropharyngeal exudate.  Eyes: Conjunctivae are normal. No scleral icterus.  Neck: Normal range of motion. Neck supple. No JVD present. No thyromegaly present.  Cardiovascular: Normal rate, regular rhythm, normal heart sounds and intact distal pulses.  Exam reveals no gallop and no friction rub.   No murmur heard. Pulmonary/Chest: Effort normal and breath sounds normal. No respiratory distress. She has no wheezes. She has no rales. She exhibits no tenderness.  Abdominal: Soft. Bowel sounds are normal. She exhibits no distension and no mass. There is no tenderness. There is no rigidity, no rebound, no guarding, no CVA tenderness, no tenderness at McBurney's point and negative Murphy's sign.  Musculoskeletal: Normal range of motion.  Lymphadenopathy:    She has no cervical adenopathy.  Neurological: She is alert and oriented to  person, place, and time.  Skin: Skin is warm and dry. She is not diaphoretic.  Psychiatric: She has a normal mood and affect. Her behavior is normal. Judgment and thought content normal.  Nursing note and vitals reviewed.         Assessment & Plan:    1. Dysuria  - Urinalysis, Routine w reflex microscopic (not at [facility]) - Urine culture - ciprofloxacin (CIPRO) 500 MG tablet; Take 1 tablet (500 mg total) by mouth 2 (two) times daily.  Dispense: 10 tablet; Refill: 0  2. Subacute confusional state  - Urinalysis, Routine w reflex microscopic (not at [facility]) - Urine culture - ciprofloxacin (CIPRO) 500 MG tablet; Take 1 tablet (500 mg total) by mouth 2 (two) times daily.  Dispense: 10 tablet; Refill: 0

## 2015-04-30 NOTE — Patient Instructions (Signed)

## 2015-05-01 ENCOUNTER — Encounter: Payer: Self-pay | Admitting: Internal Medicine

## 2015-05-01 ENCOUNTER — Other Ambulatory Visit: Payer: Self-pay

## 2015-05-01 LAB — URINALYSIS, MICROSCOPIC ONLY
Bacteria, UA: NONE SEEN
Casts: NONE SEEN
Crystals: NONE SEEN

## 2015-05-01 LAB — URINALYSIS, ROUTINE W REFLEX MICROSCOPIC
Bilirubin Urine: NEGATIVE
Glucose, UA: NEGATIVE mg/dL
KETONES UR: NEGATIVE mg/dL
NITRITE: NEGATIVE
Protein, ur: NEGATIVE mg/dL
SPECIFIC GRAVITY, URINE: 1.015 (ref 1.005–1.030)
UROBILINOGEN UA: 0.2 mg/dL (ref 0.0–1.0)
pH: 7 (ref 5.0–8.0)

## 2015-05-01 LAB — URINE CULTURE
COLONY COUNT: NO GROWTH
ORGANISM ID, BACTERIA: NO GROWTH

## 2015-05-01 MED ORDER — ATENOLOL 100 MG PO TABS: 100.0000 mg | ORAL_TABLET | Freq: Every day | ORAL | Status: DC

## 2015-05-08 ENCOUNTER — Other Ambulatory Visit: Payer: Self-pay | Admitting: Internal Medicine

## 2015-05-08 MED ORDER — DOXEPIN HCL 25 MG PO CAPS: 25.0000 mg | ORAL_CAPSULE | Freq: Every day | ORAL | Status: DC

## 2015-07-02 ENCOUNTER — Ambulatory Visit (INDEPENDENT_AMBULATORY_CARE_PROVIDER_SITE_OTHER): Payer: BC Managed Care – PPO | Admitting: Internal Medicine

## 2015-07-02 VITALS — BP 122/64 | HR 62 | Temp 98.4°F | Resp 16 | Ht 62.0 in | Wt 192.0 lb

## 2015-07-02 DIAGNOSIS — L309 Dermatitis, unspecified: Secondary | ICD-10-CM

## 2015-07-02 MED ORDER — DEXAMETHASONE SODIUM PHOSPHATE 10 MG/ML IJ SOLN: 10.0000 mg | Freq: Once | INTRAMUSCULAR | Status: DC

## 2015-07-02 MED ORDER — PREDNISONE 20 MG PO TABS: ORAL_TABLET | ORAL | Status: DC

## 2015-07-02 MED ORDER — DEXAMETHASONE SODIUM PHOSPHATE 100 MG/10ML IJ SOLN
10.0000 mg | Freq: Once | INTRAMUSCULAR | Status: AC
  Administered 2015-07-02: 10 mg via INTRAMUSCULAR

## 2015-07-02 MED ORDER — HYDROXYZINE HCL 10 MG PO TABS: 10.0000 mg | ORAL_TABLET | Freq: Three times a day (TID) | ORAL | Status: DC | PRN

## 2015-07-02 MED ORDER — DEXAMETHASONE SODIUM PHOSPHATE 10 MG/ML IJ SOLN
10.0000 mg | Freq: Once | INTRAMUSCULAR | Status: DC
Start: 2015-07-02 — End: 2015-07-02

## 2015-07-02 NOTE — Patient Instructions (Signed)
Please start taking prednisone as instructed and take it until it is all the way gone.  Please use the hydroxazine or the vistaryl three times a day as needed for itching.   Please pick up a jar of vaseline.  Take lukewarm showers and pat skin dry.  Place lotion on skin up to three times daily as needed for moisturizing.    If no improvement call the office and let us know.    Eczema Eczema, also called atopic dermatitis, is a skin disorder that causes inflammation of the skin. It causes a red rash and dry, scaly skin. The skin becomes very itchy. Eczema is generally worse during the cooler winter months and often improves with the warmth of summer. Eczema usually starts showing signs in infancy. Some children outgrow eczema, but it may last through adulthood.  CAUSES  The exact cause of eczema is not known, but it appears to run in families. People with eczema often have a family history of eczema, allergies, asthma, or hay fever. Eczema is not contagious. Flare-ups of the condition may be caused by:   Contact with something you are sensitive or allergic to.   Stress. SIGNS AND SYMPTOMS  Dry, scaly skin.   Red, itchy rash.   Itchiness. This may occur before the skin rash and may be very intense.  DIAGNOSIS  The diagnosis of eczema is usually made based on symptoms and medical history. TREATMENT  Eczema cannot be cured, but symptoms usually can be controlled with treatment and other strategies. A treatment plan might include:  Controlling the itching and scratching.   Use over-the-counter antihistamines as directed for itching. This is especially useful at night when the itching tends to be worse.   Use over-the-counter steroid creams as directed for itching.   Avoid scratching. Scratching makes the rash and itching worse. It may also result in a skin infection (impetigo) due to a break in the skin caused by scratching.   Keeping the skin well moisturized with creams  every day. This will seal in moisture and help prevent dryness. Lotions that contain alcohol and water should be avoided because they can dry the skin.   Limiting exposure to things that you are sensitive or allergic to (allergens).   Recognizing situations that cause stress.   Developing a plan to manage stress.  HOME CARE INSTRUCTIONS   Only take over-the-counter or prescription medicines as directed by your health care provider.   Do not use anything on the skin without checking with your health care provider.   Keep baths or showers short (5 minutes) in warm (not hot) water. Use mild cleansers for bathing. These should be unscented. You may add nonperfumed bath oil to the bath water. It is best to avoid soap and bubble bath.   Immediately after a bath or shower, when the skin is still damp, apply a moisturizing ointment to the entire body. This ointment should be a petroleum ointment. This will seal in moisture and help prevent dryness. The thicker the ointment, the better. These should be unscented.   Keep fingernails cut short. Children with eczema may need to wear soft gloves or mittens at night after applying an ointment.   Dress in clothes made of cotton or cotton blends. Dress lightly, because heat increases itching.   A child with eczema should stay away from anyone with fever blisters or cold sores. The virus that causes fever blisters (herpes simplex) can cause a serious skin infection in children with eczema.  SEEK MEDICAL CARE IF:   Your itching interferes with sleep.   Your rash gets worse or is not better within 1 week after starting treatment.   You see pus or soft yellow scabs in the rash area.   You have a fever.   You have a rash flare-up after contact with someone who has fever blisters.  Document Released: 10/01/2000 Document Revised: 07/25/2013 Document Reviewed: 05/07/2013 [facility] Patient Information 2015 [place], [place]. This information is  not intended to replace advice given to you by your health care provider. Make sure you discuss any questions you have with your health care provider.

## 2015-07-02 NOTE — Progress Notes (Signed)
   Subjective:    Patient ID: [name], female    DOB: 26-Apr-1956, 59 y.o.   MRN: [id number]  Cough Associated symptoms include a rash and rhinorrhea. Pertinent negatives include no chills, fever or sore throat.  Patient presents to the office for evaluation of eczema and also cough, runny nose, and congestion.  She reports that she has had this going on for the last week.  She reports that her eyes are itchy and running.  She also reports that her eczema is so bad that she is scratching herself in her sleep and making her skin bleed.  She has been taking benadryl and has also been using her kenalog cream.  She does have bad seasonal allergies, but can't afford to get zyrtec.     Review of Systems  Constitutional: Negative for fever, chills and fatigue.  HENT: Positive for congestion, rhinorrhea and sneezing. Negative for sore throat and trouble swallowing.   Eyes: Positive for discharge (watery).  Respiratory: Positive for cough.   Gastrointestinal: Negative for nausea, vomiting, abdominal pain, diarrhea and constipation.  Genitourinary: Negative for dysuria, urgency, frequency, hematuria and difficulty urinating.  Skin: Positive for rash.       Objective:   Physical Exam  Constitutional: She is oriented to person, place, and time. She appears well-developed and well-nourished. No distress.  HENT:  Head: Normocephalic.  Nose: Mucosal edema present.  Mouth/Throat: Uvula is midline, oropharynx is clear and moist and mucous membranes are normal. No trismus in the jaw. No oropharyngeal exudate, posterior oropharyngeal edema or posterior oropharyngeal erythema.  Eyes: EOM and lids are normal. Pupils are equal, round, and reactive to light. Right eye exhibits chemosis. Left eye exhibits chemosis. No scleral icterus. Right eye exhibits normal extraocular motion and no nystagmus. Left eye exhibits normal extraocular motion and no nystagmus.  Eyes visibly watering, redness around the rims  of the eyes  Neck: Normal range of motion. Neck supple. No JVD present. No thyromegaly present.  Cardiovascular: Normal rate, regular rhythm, normal heart sounds and intact distal pulses.  Exam reveals no gallop and no friction rub.   No murmur heard. Pulmonary/Chest: Effort normal and breath sounds normal. No respiratory distress. She has no wheezes. She has no rales. She exhibits no tenderness.  Abdominal: Soft. Bowel sounds are normal. She exhibits no distension and no mass. There is no tenderness. There is no rebound and no guarding.  Lymphadenopathy:    She has no cervical adenopathy.  Neurological: She is alert and oriented to person, place, and time.  Skin: Skin is warm and dry. Rash noted. Rash is maculopapular. She is not diaphoretic.  Generalized excoriated dry maculopapular rash with worsening around the neck, and flexor regions with some scabs and irritation.    Psychiatric: She has a normal mood and affect. Her behavior is normal. Judgment and thought content normal.  Nursing note and vitals reviewed.         Assessment & Plan:    1. Eczema -prednisone x 2 weeks -vaseline -cont triamcinolone - dexamethasone (DECADRON) injection 10 mg; Inject 1 mL (10 mg total) into the muscle once. -hydroxazine  2.  Allergic rhinitis -cont prednisone -daily antihistamine

## 2015-07-07 ENCOUNTER — Encounter: Payer: Self-pay | Admitting: Physician Assistant

## 2015-07-07 ENCOUNTER — Ambulatory Visit (INDEPENDENT_AMBULATORY_CARE_PROVIDER_SITE_OTHER): Payer: BC Managed Care – PPO | Admitting: Physician Assistant

## 2015-07-07 VITALS — BP 142/88 | HR 64 | Temp 98.6°F | Resp 16 | Ht 62.0 in | Wt 187.2 lb

## 2015-07-07 DIAGNOSIS — R1011 Right upper quadrant pain: Secondary | ICD-10-CM | POA: Diagnosis not present

## 2015-07-07 DIAGNOSIS — R17 Unspecified jaundice: Secondary | ICD-10-CM

## 2015-07-07 DIAGNOSIS — R112 Nausea with vomiting, unspecified: Secondary | ICD-10-CM

## 2015-07-07 LAB — BASIC METABOLIC PANEL WITH GFR
BUN: 9 mg/dL (ref 7–25)
CHLORIDE: 98 mmol/L (ref 98–110)
CO2: 25 mmol/L (ref 20–31)
CREATININE: 0.81 mg/dL (ref 0.50–0.99)
Calcium: 9.4 mg/dL (ref 8.6–10.4)
GFR, Est African American: >89 mL/min (ref >=60)
GFR, Est Non African American: 79 mL/min (ref >=60)
Glucose, Bld: 77 mg/dL (ref 65–99)
Potassium: 3.3 mmol/L — ABNORMAL LOW (ref 3.5–5.3)
SODIUM: 137 mmol/L (ref 135–146)

## 2015-07-07 LAB — HEPATIC FUNCTION PANEL
ALT: 22 U/L (ref 6–29)
AST: 16 U/L (ref 10–35)
Albumin: 4.6 g/dL (ref 3.6–5.1)
Alkaline Phosphatase: 99 U/L (ref 33–130)
BILIRUBIN DIRECT: 0.1 mg/dL (ref <=0.2)
BILIRUBIN TOTAL: 0.7 mg/dL (ref 0.2–1.2)
Indirect Bilirubin: 0.6 mg/dL (ref 0.2–1.2)
Total Protein: 7.3 g/dL (ref 6.1–8.1)

## 2015-07-07 LAB — CBC WITH DIFFERENTIAL/PLATELET
BASOS ABS: 0 10*3/uL (ref 0.0–0.1)
BASOS PCT: 0 % (ref 0–1)
EOS ABS: 0.1 10*3/uL (ref 0.0–0.7)
Eosinophils Relative: 2 % (ref 0–5)
HCT: 45.7 % (ref 36.0–46.0)
Hemoglobin: 15.6 g/dL — ABNORMAL HIGH (ref 12.0–15.0)
Lymphocytes Relative: 28 % (ref 12–46)
Lymphs Abs: 1.8 10*3/uL (ref 0.7–4.0)
MCH: 30.5 pg (ref 26.0–34.0)
MCHC: 34.1 g/dL (ref 30.0–36.0)
MCV: 89.3 fL (ref 78.0–100.0)
MONOS PCT: 10 % (ref 3–12)
MPV: 9.5 fL (ref 8.6–12.4)
Monocytes Absolute: 0.7 10*3/uL (ref 0.1–1.0)
NEUTROS PCT: 60 % (ref 43–77)
Neutro Abs: 3.9 10*3/uL (ref 1.7–7.7)
PLATELETS: 305 10*3/uL (ref 150–400)
RBC: 5.12 MIL/uL — ABNORMAL HIGH (ref 3.87–5.11)
RDW: 15.5 % (ref 11.5–15.5)
WBC: 6.5 10*3/uL (ref 4.0–10.5)

## 2015-07-07 LAB — AMYLASE: AMYLASE: 28 U/L (ref 0–105)

## 2015-07-07 MED ORDER — PROMETHAZINE HCL 25 MG PO TABS: 25.0000 mg | ORAL_TABLET | Freq: Four times a day (QID) | ORAL | Status: DC | PRN

## 2015-07-07 NOTE — Progress Notes (Signed)
Subjective:    Patient ID: [name], female    DOB: 12-26-1955, 59 y.o.   MRN: [id number]  HPI 59 y.o. AAF with history of HTN, preDM, chol, pAFIB, kidney stones presents with nausea and vomiting. She states she went to birthday dinner Saturday, then a few hours later Saturday evening she had vomiting, nausea, with diffuse nagging pain. She has been vomiting at least 10 times since yesterday, has not been able to eat or drink anything.  Nothing makes it worse or better, last BM Saturday, not passing gas since that time. She has HA, weakness, with fever, chills denies diarrhea and constipation, CP, SOB, dizziness. She has not taken any of her meds since being sick. Denies any stomach surgery. Never had colonoscopy.   Blood pressure 142/88, pulse 64, temperature 98.6 F (37 C), resp. rate 16, height 5\' 2"  (1.575 m), weight 187 lb 3.2 oz (84.913 kg).   Current Outpatient Prescriptions on File Prior to Visit  Medication Sig Dispense Refill  . ascorbic acid (VITAMIN C) 1000 MG tablet Take 1,000 mg by mouth daily.    Marland Kitchen aspirin EC 81 MG tablet Take 81 mg by mouth daily.    Marland Kitchen atenolol (TENORMIN) 100 MG tablet Take 1 tablet (100 mg total) by mouth daily. Take 1/2-1 pill daily AD 90 tablet 0  . atorvastatin (LIPITOR) 80 MG tablet Take 26.67 mg by mouth See admin instructions. Take 1/3 tablet by mouth three times a week on Tuesday, thursday, and saturday    . Cholecalciferol (VITAMIN D PO) Take 4,000 Units by mouth daily.     Marland Kitchen diltiazem (CARDIZEM CD) 180 MG 24 hr capsule Take 1 capsule (180 mg total) by mouth daily. 90 capsule 6  . doxepin (SINEQUAN) 25 MG capsule Take 1 capsule (25 mg total) by mouth at bedtime. 30 capsule 5  . hydrOXYzine (ATARAX/VISTARIL) 10 MG tablet Take 1 tablet (10 mg total) by mouth 3 (three) times daily as needed. 90 tablet 0  . Iron Combinations (IRON COMPLEX PO) Take 1 tablet by mouth daily.    . Olopatadine HCl (PATADAY) 0.2 % SOLN Apply 1 drop to eye daily.    .  potassium chloride SA (K-DUR,KLOR-CON) 20 MEQ tablet Take 1 tablet (20 mEq total) by mouth 2 (two) times daily. 180 tablet 6  . predniSONE (DELTASONE) 20 MG tablet 3 tabs po daily x 3 days, then 2 tabs x 3 days, then 1.5 tabs x 3 days, then 1 tab x 3 days, then 0.5 tabs x 3 days 27 tablet 0  . triamcinolone cream (KENALOG) 0.1 % Apply 1 application topically as directed.     . triamterene-hydrochlorothiazide (MAXZIDE-25) 37.5-25 MG per tablet Take 1 tablet by mouth daily. 30 tablet 6   No current facility-administered medications on file prior to visit.   Past Medical History  Diagnosis Date  . MRSA (methicillin resistant staph aureus) culture positive   . Eczema   . Hypercholesteremia   . Hypertension   . Kidney stones   . Anemia   . Vitamin D deficiency   . A-fib    Past Surgical History  Procedure Laterality Date  . Breast surgery Left     biopsy-neg   Review of Systems  Constitutional: Positive for chills, appetite change and fatigue. Negative for fever, diaphoresis, activity change and unexpected weight change.  HENT: Negative.   Eyes: Negative.  Negative for visual disturbance.  Respiratory: Negative.   Cardiovascular: Negative.   Gastrointestinal: Positive for nausea, vomiting,  abdominal pain and constipation. Negative for diarrhea, blood in stool, abdominal distention, anal bleeding and rectal pain.  Genitourinary: Negative.  Negative for difficulty urinating.  Musculoskeletal: Negative.   Skin: Negative.  Negative for rash.  Neurological: Negative.  Negative for dizziness, light-headedness and headaches.       Objective:   Physical Exam  Constitutional: She is oriented to person, place, and time. She appears well-developed and well-nourished.  Eyes: Conjunctivae are normal. Pupils are equal, round, and reactive to light.  Mild yellow tinge  Neck: Normal range of motion. Neck supple.  Cardiovascular: Normal rate and regular rhythm.   Pulmonary/Chest: Effort normal  and breath sounds normal. She has no wheezes.  Abdominal: Soft. She exhibits no distension and no mass. There is tenderness (worse epigastric and RUQ with + murphy's sign). There is guarding. There is no rebound.  Musculoskeletal: Normal range of motion.  Lymphadenopathy:    She has no cervical adenopathy.  Neurological: She is alert and oriented to person, place, and time.  Skin: Skin is dry. No rash noted.       Assessment & Plan:  59 year old female with no AB surgery history with N/V x 2-3 days with AB pain,  mild jaundice appearance, + RUQ pain but no rebound tenderness Will get labs including acute hep panel, rule out gallbladder/obscruction- patient declines xray but will get [place] Zofran/phenergan given, push fluids, PPI, bland food Discussed going to the ER for possible fluids but patient wishes to go home, understands risks, will go home but if pain worsens will go to ER.

## 2015-07-07 NOTE — Patient Instructions (Signed)

## 2015-07-08 ENCOUNTER — Emergency Department (HOSPITAL_COMMUNITY): Payer: BC Managed Care – PPO

## 2015-07-08 ENCOUNTER — Encounter (HOSPITAL_COMMUNITY): Payer: Self-pay

## 2015-07-08 ENCOUNTER — Observation Stay (HOSPITAL_COMMUNITY)
Admission: EM | Admit: 2015-07-08 | Discharge: 2015-07-10 | Disposition: A | Discharge status: Home / Self Care | Payer: BC Managed Care – PPO | Attending: Cardiology | Admitting: Cardiology

## 2015-07-08 DIAGNOSIS — N200 Calculus of kidney: Secondary | ICD-10-CM | POA: Insufficient documentation

## 2015-07-08 DIAGNOSIS — K219 Gastro-esophageal reflux disease without esophagitis: Secondary | ICD-10-CM | POA: Insufficient documentation

## 2015-07-08 DIAGNOSIS — R109 Unspecified abdominal pain: Principal | ICD-10-CM | POA: Insufficient documentation

## 2015-07-08 DIAGNOSIS — Z872 Personal history of diseases of the skin and subcutaneous tissue: Secondary | ICD-10-CM | POA: Diagnosis not present

## 2015-07-08 DIAGNOSIS — I480 Paroxysmal atrial fibrillation: Secondary | ICD-10-CM | POA: Insufficient documentation

## 2015-07-08 DIAGNOSIS — I10 Essential (primary) hypertension: Secondary | ICD-10-CM | POA: Diagnosis not present

## 2015-07-08 DIAGNOSIS — E876 Hypokalemia: Secondary | ICD-10-CM

## 2015-07-08 DIAGNOSIS — D649 Anemia, unspecified: Secondary | ICD-10-CM | POA: Diagnosis not present

## 2015-07-08 DIAGNOSIS — I4891 Unspecified atrial fibrillation: Secondary | ICD-10-CM | POA: Diagnosis present

## 2015-07-08 DIAGNOSIS — R112 Nausea with vomiting, unspecified: Secondary | ICD-10-CM | POA: Diagnosis not present

## 2015-07-08 DIAGNOSIS — A084 Viral intestinal infection, unspecified: Secondary | ICD-10-CM

## 2015-07-08 DIAGNOSIS — Z79899 Other long term (current) drug therapy: Secondary | ICD-10-CM | POA: Diagnosis not present

## 2015-07-08 DIAGNOSIS — E559 Vitamin D deficiency, unspecified: Secondary | ICD-10-CM | POA: Insufficient documentation

## 2015-07-08 HISTORY — DX: Gastro-esophageal reflux disease without esophagitis: K21.9

## 2015-07-08 HISTORY — DX: Paroxysmal atrial fibrillation: I48.0

## 2015-07-08 LAB — CBC
HCT: 51.1 % — ABNORMAL HIGH (ref 36.0–46.0)
Hemoglobin: 17.8 g/dL — ABNORMAL HIGH (ref 12.0–15.0)
MCH: 31.4 pg (ref 26.0–34.0)
MCHC: 34.8 g/dL (ref 30.0–36.0)
MCV: 90.1 fL (ref 78.0–100.0)
Platelets: 297 K/uL (ref 150–400)
RBC: 5.67 MIL/uL — ABNORMAL HIGH (ref 3.87–5.11)
RDW: 14.4 % (ref 11.5–15.5)
WBC: 7.9 K/uL (ref 4.0–10.5)

## 2015-07-08 LAB — COMPREHENSIVE METABOLIC PANEL
ALK PHOS: 98 U/L (ref 38–126)
ALT: 21 U/L (ref 14–54)
ANION GAP: 14 (ref 5–15)
AST: 22 U/L (ref 15–41)
Albumin: 4.3 g/dL (ref 3.5–5.0)
BUN: 6 mg/dL (ref 6–20)
CALCIUM: 10.4 mg/dL — AB (ref 8.9–10.3)
CO2: 24 mmol/L (ref 22–32)
CREATININE: 0.85 mg/dL (ref 0.44–1.00)
Chloride: 97 mmol/L — ABNORMAL LOW (ref 101–111)
Glucose, Bld: 104 mg/dL — ABNORMAL HIGH (ref 65–99)
Potassium: 2.7 mmol/L — CL (ref 3.5–5.1)
SODIUM: 135 mmol/L (ref 135–145)
Total Bilirubin: 1 mg/dL (ref 0.3–1.2)
Total Protein: 7.7 g/dL (ref 6.5–8.1)

## 2015-07-08 LAB — LIPASE, BLOOD: Lipase: 22 U/L (ref 22–51)

## 2015-07-08 LAB — URINE MICROSCOPIC-ADD ON

## 2015-07-08 LAB — URINALYSIS, ROUTINE W REFLEX MICROSCOPIC
Bilirubin Urine: NEGATIVE
Glucose, UA: NEGATIVE mg/dL
Hgb urine dipstick: NEGATIVE
KETONES UR: NEGATIVE mg/dL
NITRITE: NEGATIVE
PROTEIN: NEGATIVE mg/dL
Specific Gravity, Urine: 1.035 — ABNORMAL HIGH (ref 1.005–1.030)
UROBILINOGEN UA: 0.2 mg/dL (ref 0.0–1.0)
pH: 7 (ref 5.0–8.0)

## 2015-07-08 LAB — TROPONIN I

## 2015-07-08 MED ORDER — DILTIAZEM HCL 60 MG PO TABS
90.0000 mg | ORAL_TABLET | Freq: Four times a day (QID) | ORAL | Status: DC
  Administered 2015-07-08 – 2015-07-09 (×3): 90 mg via ORAL
  Filled 2015-07-08 (×6): qty 1

## 2015-07-08 MED ORDER — ACETAMINOPHEN 325 MG PO TABS: 650.0000 mg | ORAL_TABLET | ORAL | Status: DC | PRN

## 2015-07-08 MED ORDER — DEXTROSE 5 % IV SOLN
5.0000 mg/h | INTRAVENOUS | Status: DC
  Filled 2015-07-08: qty 100

## 2015-07-08 MED ORDER — APIXABAN 5 MG PO TABS
5.0000 mg | ORAL_TABLET | Freq: Two times a day (BID) | ORAL | Status: DC
  Administered 2015-07-08 – 2015-07-10 (×4): 5 mg via ORAL
  Filled 2015-07-08 (×4): qty 1

## 2015-07-08 MED ORDER — IOHEXOL 350 MG/ML SOLN
100.0000 mL | Freq: Once | INTRAVENOUS | Status: AC | PRN
  Administered 2015-07-08: 100 mL via INTRAVENOUS

## 2015-07-08 MED ORDER — DILTIAZEM LOAD VIA INFUSION
10.0000 mg | Freq: Once | INTRAVENOUS | Status: DC
  Filled 2015-07-08: qty 10

## 2015-07-08 MED ORDER — DOXEPIN HCL 25 MG PO CAPS
25.0000 mg | ORAL_CAPSULE | Freq: Every day | ORAL | Status: DC
  Administered 2015-07-09: 25 mg via ORAL
  Filled 2015-07-08 (×3): qty 1

## 2015-07-08 MED ORDER — SODIUM CHLORIDE 0.9 % IV BOLUS (SEPSIS)
500.0000 mL | Freq: Once | INTRAVENOUS | Status: AC
  Administered 2015-07-08: 500 mL via INTRAVENOUS

## 2015-07-08 MED ORDER — VITAMIN D 1000 UNITS PO TABS
4000.0000 [IU] | ORAL_TABLET | Freq: Every day | ORAL | Status: DC
  Administered 2015-07-09 – 2015-07-10 (×2): 4000 [IU] via ORAL
  Filled 2015-07-08 (×2): qty 4

## 2015-07-08 MED ORDER — ATORVASTATIN CALCIUM 20 MG PO TABS: 26.6700 mg | ORAL_TABLET | ORAL | Status: DC

## 2015-07-08 MED ORDER — TRIAMTERENE-HCTZ 37.5-25 MG PO TABS
1.0000 | ORAL_TABLET | Freq: Every day | ORAL | Status: DC
  Administered 2015-07-09 – 2015-07-10 (×2): 1 via ORAL
  Filled 2015-07-08 (×3): qty 1

## 2015-07-08 MED ORDER — ATORVASTATIN CALCIUM 20 MG PO TABS: 20.0000 mg | ORAL_TABLET | ORAL | Status: DC

## 2015-07-08 MED ORDER — VITAMIN C 500 MG PO TABS
1000.0000 mg | ORAL_TABLET | Freq: Every day | ORAL | Status: DC
  Administered 2015-07-09 – 2015-07-10 (×2): 1000 mg via ORAL
  Filled 2015-07-08 (×2): qty 2

## 2015-07-08 MED ORDER — OLOPATADINE HCL 0.1 % OP SOLN
1.0000 [drp] | Freq: Two times a day (BID) | OPHTHALMIC | Status: DC
  Administered 2015-07-09 – 2015-07-10 (×3): 1 [drp] via OPHTHALMIC
  Filled 2015-07-08: qty 5

## 2015-07-08 MED ORDER — POTASSIUM CHLORIDE 10 MEQ/100ML IV SOLN
10.0000 meq | INTRAVENOUS | Status: DC
  Administered 2015-07-08 (×2): 10 meq via INTRAVENOUS
  Filled 2015-07-08 (×2): qty 100

## 2015-07-08 MED ORDER — POTASSIUM CHLORIDE CRYS ER 20 MEQ PO TBCR
40.0000 meq | EXTENDED_RELEASE_TABLET | Freq: Once | ORAL | Status: AC
  Administered 2015-07-08: 40 meq via ORAL
  Filled 2015-07-08: qty 2

## 2015-07-08 MED ORDER — PROMETHAZINE HCL 25 MG PO TABS: 25.0000 mg | ORAL_TABLET | Freq: Four times a day (QID) | ORAL | Status: DC | PRN

## 2015-07-08 MED ORDER — ATENOLOL 25 MG PO TABS
100.0000 mg | ORAL_TABLET | Freq: Every day | ORAL | Status: DC
  Administered 2015-07-09 – 2015-07-10 (×2): 100 mg via ORAL
  Filled 2015-07-08 (×2): qty 4

## 2015-07-08 MED ORDER — ONDANSETRON HCL 4 MG/2ML IJ SOLN: 4.0000 mg | Freq: Four times a day (QID) | INTRAMUSCULAR | Status: DC | PRN

## 2015-07-08 NOTE — ED Provider Notes (Signed)
CSN: [id number]     Arrival date & time 07/08/15  1050 History   First MD Initiated Contact with Patient 07/08/15 1220     Chief Complaint  Patient presents with  . Abdominal Pain     (Consider location/radiation/quality/duration/timing/severity/associated sxs/prior Treatment) HPI Patient presents to the emergency department with abdominal discomfort with nausea and vomiting.  Patient states that she is also had a fluttering in her chest and palpitations.  Patient states that she has had a history of this that was just diagnosed in the past year.  The patient states that her time she stands she becomes lightheaded and has palpitations.  She states that nothing seems make her condition better.  The patient states she did not take any medications prior to arrival.  The patient denies chest pain, shortness of breath, weakness, dizziness, headache, blurred vision, neck pain, dysuria, incontinence, hematemesis, bloody stool, fever, near syncope or syncope. Past Medical History  Diagnosis Date  . MRSA (methicillin resistant staph aureus) culture positive   . Eczema   . Hypercholesteremia   . Hypertension   . Kidney stones   . Anemia   . Vitamin D deficiency   . A-fib    Past Surgical History  Procedure Laterality Date  . Breast surgery Left     biopsy-neg   Family History  Problem Relation Age of Onset  . Hypertension Mother   . Hepatitis C Mother   . Heart disease Father   . Dementia Father    Social History  Substance Use Topics  . Smoking status: Never Smoker   . Smokeless tobacco: None  . Alcohol Use: No   OB History    No data available     Review of Systems All other systems negative except as documented in the HPI. All pertinent positives and negatives as reviewed in the HPI.   Allergies  Penicillins  Home Medications   Prior to Admission medications   Medication Sig Start Date End Date Taking? Authorizing Provider  ascorbic acid (VITAMIN C) 1000 MG tablet  Take 1,000 mg by mouth daily.   Yes Historical Provider, MD  aspirin EC 81 MG tablet Take 81 mg by mouth daily.   Yes Historical Provider, MD  atenolol (TENORMIN) 100 MG tablet Take 1 tablet (100 mg total) by mouth daily. Take 1/2-1 pill daily AD 05/01/15  Yes Unk Pinto, MD  doxepin (SINEQUAN) 25 MG capsule Take 1 capsule (25 mg total) by mouth at bedtime. 05/08/15  Yes Courtney Forcucci, PA-C  Olopatadine HCl (PATADAY) 0.2 % SOLN Apply 1 drop to eye daily.   Yes Historical Provider, MD  promethazine (PHENERGAN) 25 MG tablet Take 1 tablet (25 mg total) by mouth every 6 (six) hours as needed for nausea or vomiting. Max: 4 tablets per day 07/07/15  Yes Vicie Mutters, PA-C  triamcinolone cream (KENALOG) 0.1 % Apply 1 application topically as directed.  02/04/15  Yes Historical Provider, MD  atorvastatin (LIPITOR) 80 MG tablet Take 26.67 mg by mouth See admin instructions. Take 1/3 tablet by mouth three times a week on Tuesday, thursday, and saturday    Historical Provider, MD  Cholecalciferol (VITAMIN D PO) Take 4,000 Units by mouth daily.     Historical Provider, MD  diltiazem (CARDIZEM CD) 180 MG 24 hr capsule Take 1 capsule (180 mg total) by mouth daily. 02/13/15   Dorothy Spark, MD  hydrOXYzine (ATARAX/VISTARIL) 10 MG tablet Take 1 tablet (10 mg total) by mouth 3 (three) times daily as needed.  07/02/15   Courtney Forcucci, PA-C  Iron Combinations (IRON COMPLEX PO) Take 1 tablet by mouth daily.    Historical Provider, MD  potassium chloride SA (K-DUR,KLOR-CON) 20 MEQ tablet Take 1 tablet (20 mEq total) by mouth 2 (two) times daily. 02/13/15   Dorothy Spark, MD  triamterene-hydrochlorothiazide (MAXZIDE-25) 37.5-25 MG per tablet Take 1 tablet by mouth daily. 02/13/15   Dorothy Spark, MD   BP 167/113 mmHg  Pulse 66  Temp(Src) 97.6 F (36.4 C) (Oral)  Resp 16  Ht 5\' 1"  (1.549 m)  Wt 183 lb 3.2 oz (83.099 kg)  BMI 34.63 kg/m2  SpO2 99% Physical Exam  Constitutional: She is oriented to  person, place, and time. She appears well-developed and well-nourished. No distress.  HENT:  Head: Normocephalic and atraumatic.  Mouth/Throat: Oropharynx is clear and moist.  Eyes: Pupils are equal, round, and reactive to light.  Neck: Normal range of motion. Neck supple.  Cardiovascular: Normal heart sounds.  Exam reveals no gallop and no friction rub.   No murmur heard. Pulmonary/Chest: Effort normal.  Abdominal: Soft. Bowel sounds are normal. She exhibits no distension. There is tenderness. There is no rebound and no guarding.  Neurological: She is alert and oriented to person, place, and time. She exhibits normal muscle tone. Coordination normal.  Skin: Skin is warm and dry. No rash noted. No erythema.  Psychiatric: She has a normal mood and affect. Her behavior is normal.  Nursing note and vitals reviewed.   ED Course  Procedures (including critical care time) Labs Review Labs Reviewed  COMPREHENSIVE METABOLIC PANEL - Abnormal; Notable for the following:    Potassium 2.7 (*)    Chloride 97 (*)    Glucose, Bld 104 (*)    Calcium 10.4 (*)    All other components within normal limits  CBC - Abnormal; Notable for the following:    RBC 5.67 (*)    Hemoglobin 17.8 (*)    HCT 51.1 (*)    All other components within normal limits  LIPASE, BLOOD  TROPONIN I  URINALYSIS, ROUTINE W REFLEX MICROSCOPIC (NOT AT [facility])    Imaging Review Dg Chest 2 View  07/08/2015   CLINICAL DATA:  Irregular and increased heart rate.  EXAM: CHEST  2 VIEW  COMPARISON:  August 12, 2014, October 27, 2011  FINDINGS: The heart size and mediastinal contours are stable. Right hilum is unchanged. Both lungs are clear. The visualized skeletal structures are unremarkable.  IMPRESSION: No active cardiopulmonary disease.   Electronically Signed   By: Abelardo Diesel M.D.   On: 07/08/2015 12:47   I have personally reviewed and evaluated these images and lab results as part of my medical decision-making.  The   Patient will be admitted to the hospital for further evaluation and care.  Cardiology came down to evaluate the patient for her positional paroxysmal atrial fibrillation comes on with exertion.  The patient is lying in bed.  She is in normal rate when she stands or moves she gets up and 160s [id number] PA-C 07/15/15 8295  Harvel Quale, MD 07/21/15 2242

## 2015-07-08 NOTE — H&P (Signed)
CARDIOLOGY CONSULT NOTE   Patient ID: [name] MRN: [id number], DOB/AGE: 1955/10/27   Admit date: 07/08/2015 Date of Consult: 07/08/2015    Primary Physician: Alesia Richards, MD Primary Cardiologist: Dr. Meda Coffee  HPI: [name] is a 59 y.o. female with past medical history of HTN, HLD, and Paroxysmal Atrial fibrillation (diagnosed 10/2011) who presents to [facility] ED on 07/08/2015 for nausea, vomiting, and her chest "feeling jittery" since Sunday (07/06/2015).  The patient reports the symptoms started overnight and into the morning on Sunday. She initially had severe nausea and vomiting associated with some "aching" chest pain lasting a maximum of 5 minutes after the vomiting episodes. She reports later on Sunday afternoon, her vomiting continued and the symptoms of her chest feeling "jittery" started. She reports the feeling was similar to what she experienced in 2013 when she was initially diagnosed with atrial fibrillation.   She was given Zofran and Phenergan by her PCP on 07/07/2015 which has helped with her nausea and vomiting but her chest kept feeling "jittery" so she presented to the ED today.  Her initial EKG showed atrial fibrillation w/ RVR with rate in the 170's. While on telemetry she has been noted to be switching between Sinus tachycardia with rate in the 130's - 140's and atrial fibrillation with rates in the 120's - 150's.   Initial i-stat troponin was negative. Labs significant for K+ 2.7, Chloride 97, and benign CXR. CT of the abdomen and pelvis is pending to check for Mesenteric Ischemia.  The patient was last seen in office by Dr. Meda Coffee in 01/2015 and was doing well at that time. Normal NST in 09/2014 with EF of 61% and normal wall motion. Did have hypertensive response on exam so Cardizem CD was increased from 120mg  to 180mg  PO daily. Has only been on ASA 81mg   for anticoagulation due to only having one known episode of A-fib in the past in 2013.      Problem List Past Medical History  Diagnosis Date  . MRSA (methicillin resistant staph aureus) culture positive   . Eczema   . Hypercholesteremia   . Hypertension   . Kidney stones   . Anemia   . Vitamin D deficiency   . A-fib     Past Surgical History  Procedure Laterality Date  . Breast surgery Left     biopsy-neg     Allergies Allergies  Allergen Reactions  . Penicillins Other (See Comments)    DOESN'T REMEMBER      Inpatient Medications . diltiazem  10 mg Intravenous Once    Family History Family History  Problem Relation Age of Onset  . Hypertension Mother   . Hepatitis C Mother   . Heart disease Father   . Dementia Father      Social History Social History   Social History  . Marital Status: Widowed    Spouse Name: N/A  . Number of Children: N/A  . Years of Education: N/A   Occupational History  . Not on file.   Social History Main Topics  . Smoking status: Never Smoker   . Smokeless tobacco: Not on file  . Alcohol Use: No  . Drug Use: No  . Sexual Activity: Not on file   Other Topics Concern  . Not on file   Social History Narrative     Review of Systems General:  No fever, night sweats or weight changes. Positive for chills. Cardiovascular:  No dyspnea on exertion, edema, orthopnea, paroxysmal  nocturnal dyspnea. Positive for chest pain and palpitations. Dermatological: No rash, lesions/masses Respiratory: No cough, dyspnea Urologic: No hematuria, dysuria Abdominal:  Positive for nausea and vomiting. Denies diarrhea, bright red blood per rectum, melena, or hematemesis Neurologic:  No visual changes, wkns, changes in mental status. All other systems reviewed and are otherwise negative except as noted above.  Physical Exam Blood pressure 146/81, pulse 89, temperature 97.6 F (36.4 C), temperature source Oral, resp. rate 22, height 5\' 1"  (1.549 m), weight 183 lb 3.2 oz (83.099 kg), SpO2 99 %.  General: Pleasant, African American  female in NAD Psych: Normal affect. Neuro: Alert and oriented X 3. Moves all extremities spontaneously. HEENT: Normal  Neck: Supple without bruits or JVD. Lungs:  Resp regular and unlabored, CTA. Heart: RRR no s3, s4, or murmurs. Abdomen: Soft, non-tender, non-distended, BS + x 4.  Extremities: No clubbing, cyanosis or edema. DP/PT/Radials 2+ and equal bilaterally.  Labs   Recent Labs  07/08/15 1215  TROPONINI <0.03   Lab Results  Component Value Date   WBC 7.9 07/08/2015   HGB 17.8* 07/08/2015   HCT 51.1* 07/08/2015   MCV 90.1 07/08/2015   PLT 297 07/08/2015    Recent Labs Lab 07/08/15 1156  NA 135  K 2.7*  CL 97*  CO2 24  BUN 6  CREATININE 0.85  CALCIUM 10.4*  PROT 7.7  BILITOT 1.0  ALKPHOS 98  ALT 21  AST 22  GLUCOSE 104*   Lab Results  Component Value Date   CHOL 157 04/28/2015   HDL 69 04/28/2015   LDLCALC 78 04/28/2015   TRIG 49 04/28/2015    Radiology/Studies  Dg Chest 2 View: 07/08/2015   CLINICAL DATA:  Irregular and increased heart rate.  EXAM: CHEST  2 VIEW  COMPARISON:  August 12, 2014, October 27, 2011  FINDINGS: The heart size and mediastinal contours are stable. Right hilum is unchanged. Both lungs are clear. The visualized skeletal structures are unremarkable.  IMPRESSION: No active cardiopulmonary disease.   Electronically Signed   By: Abelardo Diesel M.D.   On: 07/08/2015 12:47    ECG: Atrial fibrillation with RVR, rate in 170's.  ASSESSMENT AND PLAN  1. Paroxysmal Atrial Fibrillation with RVR - last known episode of atrial fibrillation was in 10/2011 and patient reports she has not had recurrent palpitations until now - This patients CHA2DS2-VASc Score and unadjusted Ischemic Stroke Rate (% per year) is equal to 2.2 % stroke rate/year from a score of 2 (HTN, Female). She had been on ASA 81mg  for anticoagulation due to only having one known episode until now. - on Cardizem CD 180mg  daily for rate control.  - Will obtain  echocardiogram  2. HTN - BP has been 133/76 - 173/113 while in the ED. - Continue home medication regimen  3. HLD - continue statin therapy  4. Nausea and Vomiting - likely etiology of viral gastroenteritis. Patient without fever or leukocytosis at this time. - will continue anti-emetic agents  5. Hypokalemia - Potassium at 2.7 while in the ED - currently being replaced. - monitor with daily BMET.  Signed, Erma Heritage, PA-C 07/08/2015, 3:16 PM Pager: [phone] As above, patient seen and examined. Briefly she is a 59 year old female with a past medical history of paroxysmal atrial fibrillation, hypertension, hyperlipidemia, eczema for evaluation of recurrent atrial fibrillation. Patient had a negative nuclear study in December 2015. After dinner Saturday evening the patient developed abdominal pain, nausea and vomiting. This has been persistent over the past  several days although improved this afternoon. She denies diarrhea, melanoma or hematochezia. She has had chills but no fever. Yesterday she developed a "jittery" feeling in her chest. She has not had chest pain or dyspnea. She presented to the emergency room and was noted to be in atrial fibrillation with rapid ventricular response. Cardiology was asked to evaluate. While evaluating the patient was in and out of atrial fibrillation. Electrocardiogram shows atrial fibrillation with a rapid ventricular response and nonspecific ST depression. Plan to admit. Continue atenolol. Increase Cardizem to 90 mg every 6 hours and if she tolerates this change to 360 mg of CD tomorrow morning. Follow on telemetry.  Recent TSH normal. Check echocardiogram for LV function. Embolic risk factors include hypertension and female sex. CHADSvasc 2. Add apixaban 5 mg BID. Discontinue aspirin. Note she has had nausea and vomiting and abdominal pain. Question viral gastroenteritis. Abdominal CT has been ordered by the emergency room with results pending.  Supplement potassium. Possible discharge tomorrow if patient remains in sinus rhythm, CT unremarkable and abdominal pain resolved. Kirk Ruths

## 2015-07-08 NOTE — ED Notes (Signed)
Onset Sunday morning constant abd pain, vomiting, nausea.  Pt seen PCP yesterday was given med for nausea/vomiing and got relief.  No nausea vomiting today.  Pt reports chest feels "nervous".  PCP called pt this morning who advised her to come to ED.

## 2015-07-09 ENCOUNTER — Encounter (HOSPITAL_COMMUNITY): Payer: Self-pay | Admitting: General Practice

## 2015-07-09 DIAGNOSIS — A084 Viral intestinal infection, unspecified: Secondary | ICD-10-CM | POA: Diagnosis not present

## 2015-07-09 DIAGNOSIS — I4891 Unspecified atrial fibrillation: Secondary | ICD-10-CM

## 2015-07-09 LAB — BASIC METABOLIC PANEL
Anion gap: 10 (ref 5–15)
CHLORIDE: 100 mmol/L — AB (ref 101–111)
CO2: 25 mmol/L (ref 22–32)
Calcium: 8.7 mg/dL — ABNORMAL LOW (ref 8.9–10.3)
Creatinine, Ser: 0.71 mg/dL (ref 0.44–1.00)
GFR calc Af Amer: >60 mL/min (ref >60)
GFR calc non Af Amer: >60 mL/min (ref >60)
GLUCOSE: 107 mg/dL — AB (ref 65–99)
POTASSIUM: 3 mmol/L — AB (ref 3.5–5.1)
Sodium: 135 mmol/L (ref 135–145)

## 2015-07-09 MED ORDER — DILTIAZEM HCL ER COATED BEADS 180 MG PO CP24
360.0000 mg | ORAL_CAPSULE | Freq: Every day | ORAL | Status: DC
  Administered 2015-07-09 – 2015-07-10 (×2): 360 mg via ORAL
  Filled 2015-07-09 (×3): qty 2

## 2015-07-09 MED ORDER — POTASSIUM CHLORIDE CRYS ER 20 MEQ PO TBCR
40.0000 meq | EXTENDED_RELEASE_TABLET | Freq: Two times a day (BID) | ORAL | Status: AC
  Administered 2015-07-09 (×2): 40 meq via ORAL
  Filled 2015-07-09 (×2): qty 2

## 2015-07-09 NOTE — Progress Notes (Signed)
Patient Name: [name] Date of Encounter: 07/09/2015  Pt profile: [name] is a 59 y.o. female with past medical history of HTN, HLD, and Paroxysmal Atrial fibrillation (diagnosed 10/2011) who presents to [facility] ED on 07/08/2015 for nausea, vomiting, and her chest "feeling jittery" since Sunday (07/06/2015) -> was noted to be in atrial fibrillation with rapid ventricular response.  SUBJECTIVE  Feeling well. No chest pain, sob or palpitations. Had vomitus after dinner yesterday. Tolerated breakfast this morning.   CURRENT MEDS . apixaban  5 mg Oral BID  . atenolol  100 mg Oral Daily  . [START ON 07/10/2015] atorvastatin  20 mg Oral Q T,Th,Sat-1800  . cholecalciferol  4,000 Units Oral Daily  . diltiazem  90 mg Oral 4 times per day  . doxepin  25 mg Oral QHS  . olopatadine  1 drop Both Eyes BID  . triamterene-hydrochlorothiazide  1 tablet Oral Daily  . ascorbic acid  1,000 mg Oral Daily    OBJECTIVE  Filed Vitals:   07/08/15 1850 07/08/15 2025 07/09/15 0150 07/09/15 0400  BP: 153/97 154/83 169/69 140/87  Pulse: 111 95  88  Temp: 99.5 F (37.5 C) 97.8 F (36.6 C)  99 F (37.2 C)  TempSrc: Oral Oral  Oral  Resp: 19 18  18   Height: 5\' 1"  (1.549 m)     Weight: 187 lb (84.823 kg)   183 lb (83.008 kg)  SpO2: 99% 100%  100%    Intake/Output Summary (Last 24 hours) at 07/09/15 0810 Last data filed at 07/09/15 0758  Gross per 24 hour  Intake    120 ml  Output      0 ml  Net    120 ml   Filed Weights   07/08/15 1139 07/08/15 1850 07/09/15 0400  Weight: 183 lb 3.2 oz (83.099 kg) 187 lb (84.823 kg) 183 lb (83.008 kg)    PHYSICAL EXAM  General: Pleasant, NAD. Neuro: Alert and oriented X 3. Moves all extremities spontaneously. Psych: Normal affect. HEENT:  Normal  Neck: Supple without bruits or JVD. Lungs:  Resp regular and unlabored, CTA. Heart: irregularly irregular with tachycardia, no s3, s4, or murmurs. Abdomen: Soft, non-tender, non-distended, BS  + x 4.  Extremities: No clubbing, cyanosis or edema. DP/PT/Radials 2+ and equal bilaterally.  Accessory Clinical Findings  CBC  Recent Labs  07/07/15 1118 07/08/15 1156  WBC 6.5 7.9  NEUTROABS 3.9  --   HGB 15.6* 17.8*  HCT 45.7 51.1*  MCV 89.3 90.1  PLT 305 130   Basic Metabolic Panel  Recent Labs  07/08/15 1156 07/09/15 0348  NA 135 135  K 2.7* 3.0*  CL 97* 100*  CO2 24 25  GLUCOSE 104* 107*  BUN 6 <5*  CREATININE 0.85 0.71  CALCIUM 10.4* 8.7*   Liver Function Tests  Recent Labs  07/07/15 1118 07/08/15 1156  AST 16 22  ALT 22 21  ALKPHOS 99 98  BILITOT 0.7 1.0  PROT 7.3 7.7  ALBUMIN 4.6 4.3    Recent Labs  07/07/15 1118 07/08/15 1156  LIPASE  --  22  AMYLASE 28  --    Cardiac Enzymes  Recent Labs  07/08/15 1215  TROPONINI <0.03    TELE  afib at rate above 100 this morning  Radiology/Studies  Dg Chest 2 View  07/08/2015   CLINICAL DATA:  Irregular and increased heart rate.  EXAM: CHEST  2 VIEW  COMPARISON:  August 12, 2014, October 27, 2011  FINDINGS:  The heart size and mediastinal contours are stable. Right hilum is unchanged. Both lungs are clear. The visualized skeletal structures are unremarkable.  IMPRESSION: No active cardiopulmonary disease.   Electronically Signed   By: Abelardo Diesel M.D.   On: 07/08/2015 12:47   Ct Angio Abd/pel W/ And/or W/o  07/08/2015   CLINICAL DATA:  Acute abdominal pain with nausea and vomiting, concern for mesenteric ischemia.  EXAM: CT ANGIOGRAPHY ABDOMEN AND PELVIS  TECHNIQUE: Multidetector CT imaging of the abdomen and pelvis was performed using the standard protocol during bolus administration of intravenous contrast. Multiplanar reconstructed images including MIPs were obtained and reviewed to evaluate the vascular anatomy.  CONTRAST:  100 cc Omnipaque 350  COMPARISON:  05/23/2015  FINDINGS: Arterial findings:  Aorta: Mild tortuosity and atherosclerotic change of the abdominal aorta without aneurysm,  occlusive process, or dissection.  Celiac axis: Celiac origin demonstrates a moderate to severe stenosis estimated at 60-80%. This appears to be related to extrinsic compression from the overlying diaphragmatic crus indicative of median arcuate ligament syndrome. Despite this, the celiac axis and its branches remain patent. This is doubtful to be symptomatic because the superior and inferior mesenteric vessels remain patent.  Superior mesenteric: Widely patent origin. Main SMA trunk is widely patent throughout the mesenteric. Jejunal and colic branches are patent.  Left renal:           Widely patent.  Right renal:          Minor origin calcification, otherwise patent.  Inferior mesenteric:  Minor origin calcification but remains patent.  Left iliac: Minor iliac atherosclerosis. The left common, internal and external iliac arteries remain patent.  Right iliac: Symmetric minor atherosclerosis. The right common, internal and external iliac arteries remain patent.  The common femoral, proximal profunda femoral, and superficial femoral arteries demonstrated are all patent.  Venous findings: IVC, hepatic veins, portal, splenic, mesenteric, and renal veins are all patent. Pelvic iliac veins are patent. Visualized femoral veins are also patent. No veno-occlusive process.  Review of the MIP images confirms the above findings.  Nonvascular findings: Stable hypodense left hepatic cyst measuring 14 mm, image 15. No other hepatic abnormality or biliary dilatation. Gallbladder, biliary system, pancreas, spleen, and adrenal glands are within normal limits for age and demonstrate no acute process. Stable small hiatal hernia. Clear lung bases. No pericardial or pleural effusion. Minor basilar atelectasis.  Kidneys demonstrate stable intrarenal calculi bilaterally without obstruction or hydronephrosis. There is cortical scarring in the left kidney lower pole.  Negative for bowel obstruction, dilatation, ileus, or free air. No  evidence of bowel wall thickening or pneumatosis. No CT signs to suggest bowel ischemia. Terminal ileum unremarkable. Minor scattered colonic diverticulosis.  Stable benign pelvic calcifications. Uterus and adnexal normal in size. Urinary bladder unremarkable. No acute distal bowel process. Sigmoid diverticulosis evident. No inguinal abnormality or hernia. No pelvic fluid collection, hemorrhage, abscess or adenopathy.  Gluteal injection granulomas noted bilaterally.  Lower lumbar facet arthropathy evident diffusely.  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 See above comment.  SMA and IMA remain patent.  No CT signs of acute bowel ischemia, obstruction, ileus or free air  No significant aortoiliac occlusive process  Bilateral nephrolithiasis  Small hiatal hernia  Stable hepatic cyst  Diverticulosis   Electronically Signed   By: Jerilynn Mages.  Shick M.D.   On: 07/08/2015 17:12    ASSESSMENT AND PLAN  1. Paroxysmal Atrial Fibrillation with RVR - last known episode of atrial fibrillation was in 10/2011 and patient reports she has not had recurrent palpitations until now -  CHA2DS2-VASc Score of 2. Continue atenolol and cardizem 90mg  q 6 hours. Continue eliquis. Will discuss with MD for better rate control management.  - Pending echocardiogram - had a negative nuclear study in December 2015  2. HTN - Improved - Continue home medication regimen  3. HLD - continue statin therapy  4. Nausea and Vomiting - viral gastroenteritis?  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 Bilateral nephrolithiasis  Small hiatal hernia  Stable hepatic cyst  Diverticulosis   -She has tolerated breakfast this morning. Will discuss with MD for  further management.   5. Hypokalemia - Potassium at 2.7 while in the ED-> given 63meq IV and kdur 87meq. - K of 3.0 this morning. Will give 74meq BID today and recheck in AM tomorrow.  Signed, Bhagat,Bhavinkumar PA-C Pager [phone]  I have personally seen and examined this patient with B Bhagat, PA-C. I agree with the assessment and plan as outlined above. She is in sinus now. She is on Cardizem 90 mg Q6 hours and atenolol. Will change to Cardizem CD 360 mg today. Echo is pending. Exam with clear lungs, RRR, no LE edema, abd tenderness. Continue Eliquis. In regards to her N/V, this has resolved. She does have what appears to be stenosis in the celiac artery origin. Since her N/V have resolved and she has no abdominal pain, I would plan outpatient vascular surgery follow up. Likely d/c tomorrow if stable.   MCALHANY,CHRISTOPHER 07/09/2015 11:37 AM

## 2015-07-10 ENCOUNTER — Telehealth: Payer: Self-pay | Admitting: *Deleted

## 2015-07-10 ENCOUNTER — Observation Stay (HOSPITAL_BASED_OUTPATIENT_CLINIC_OR_DEPARTMENT_OTHER): Payer: BC Managed Care – PPO

## 2015-07-10 DIAGNOSIS — I4891 Unspecified atrial fibrillation: Secondary | ICD-10-CM

## 2015-07-10 DIAGNOSIS — I771 Stricture of artery: Secondary | ICD-10-CM

## 2015-07-10 DIAGNOSIS — I774 Celiac artery compression syndrome: Secondary | ICD-10-CM

## 2015-07-10 LAB — CBC
HCT: 46 % (ref 36.0–46.0)
HEMOGLOBIN: 15.8 g/dL — AB (ref 12.0–15.0)
MCH: 31 pg (ref 26.0–34.0)
MCHC: 34.3 g/dL (ref 30.0–36.0)
MCV: 90.2 fL (ref 78.0–100.0)
Platelets: 278 10*3/uL (ref 150–400)
RBC: 5.1 MIL/uL (ref 3.87–5.11)
RDW: 14.5 % (ref 11.5–15.5)
WBC: 5.7 10*3/uL (ref 4.0–10.5)

## 2015-07-10 LAB — BASIC METABOLIC PANEL
ANION GAP: 7 (ref 5–15)
BUN: 5 mg/dL — ABNORMAL LOW (ref 6–20)
CALCIUM: 9.4 mg/dL (ref 8.9–10.3)
CHLORIDE: 100 mmol/L — AB (ref 101–111)
CO2: 27 mmol/L (ref 22–32)
CREATININE: 0.64 mg/dL (ref 0.44–1.00)
GFR calc Af Amer: >60 mL/min (ref >60)
GFR calc non Af Amer: >60 mL/min (ref >60)
GLUCOSE: 85 mg/dL (ref 65–99)
Potassium: 4.4 mmol/L (ref 3.5–5.1)
Sodium: 134 mmol/L — ABNORMAL LOW (ref 135–145)

## 2015-07-10 MED ORDER — APIXABAN 5 MG PO TABS: 5.0000 mg | ORAL_TABLET | Freq: Two times a day (BID) | ORAL | Status: DC

## 2015-07-10 MED ORDER — SENNOSIDES-DOCUSATE SODIUM 8.6-50 MG PO TABS
1.0000 | ORAL_TABLET | Freq: Once | ORAL | Status: AC
  Administered 2015-07-10: 1 via ORAL
  Filled 2015-07-10: qty 1

## 2015-07-10 MED ORDER — DILTIAZEM HCL ER COATED BEADS 360 MG PO CP24
360.0000 mg | ORAL_CAPSULE | Freq: Every day | ORAL | Status: DC
Start: 2015-07-10 — End: 2015-10-14

## 2015-07-10 NOTE — Progress Notes (Signed)
Patient Name: [name] Date of Encounter: 07/10/2015  Pt profile: [name] is a 59 y.o. female with past medical history of HTN, HLD, and Paroxysmal Atrial fibrillation (diagnosed 10/2011) who presents to [facility] ED on 07/08/2015 for nausea, vomiting, and her chest "feeling jittery" since Sunday (07/06/2015) -> was noted to be in atrial fibrillation with rapid ventricular response.  SUBJECTIVE  Feeling well. No chest pain, sob or palpitations. Tolerating meals. Ambulated without difficulty.   CURRENT MEDS . apixaban  5 mg Oral BID  . atenolol  100 mg Oral Daily  . atorvastatin  20 mg Oral Q T,Th,Sat-1800  . cholecalciferol  4,000 Units Oral Daily  . diltiazem  360 mg Oral Daily  . doxepin  25 mg Oral QHS  . olopatadine  1 drop Both Eyes BID  . senna-docusate  1 tablet Oral Once  . triamterene-hydrochlorothiazide  1 tablet Oral Daily  . ascorbic acid  1,000 mg Oral Daily    OBJECTIVE  Filed Vitals:   07/09/15 1058 07/09/15 1301 07/09/15 2017 07/10/15 0517  BP: 147/57 136/65 113/54 143/64  Pulse:  74 58   Temp:  97.6 F (36.4 C) 98.6 F (37 C) 98.5 F (36.9 C)  TempSrc:  Oral Oral Oral  Resp:  18 18 19   Height:      Weight:    181 lb (82.101 kg)  SpO2:  100% 100% 100%    Intake/Output Summary (Last 24 hours) at 07/10/15 0954 Last data filed at 07/10/15 0230  Gross per 24 hour  Intake    240 ml  Output      0 ml  Net    240 ml   Filed Weights   07/08/15 1850 07/09/15 0400 07/10/15 0517  Weight: 187 lb (84.823 kg) 183 lb (83.008 kg) 181 lb (82.101 kg)    PHYSICAL EXAM  General: Pleasant, NAD. Neuro: Alert and oriented X 3. Moves all extremities spontaneously. Psych: Normal affect. HEENT:  Normal  Neck: Supple without bruits or JVD. Lungs:  Resp regular and unlabored, CTA. Heart: irregularly irregular with tachycardia, no s3, s4, or murmurs. Abdomen: Soft, non-tender, non-distended, BS + x 4.  Extremities: No clubbing, cyanosis or edema.  DP/PT/Radials 2+ and equal bilaterally.  Accessory Clinical Findings  CBC  Recent Labs  07/07/15 1118 07/08/15 1156 07/10/15 0418  WBC 6.5 7.9 5.7  NEUTROABS 3.9  --   --   HGB 15.6* 17.8* 15.8*  HCT 45.7 51.1* 46.0  MCV 89.3 90.1 90.2  PLT 305 297 545   Basic Metabolic Panel  Recent Labs  07/09/15 0348 07/10/15 0418  NA 135 134*  K 3.0* 4.4  CL 100* 100*  CO2 25 27  GLUCOSE 107* 85  BUN <5* 5*  CREATININE 0.71 0.64  CALCIUM 8.7* 9.4   Liver Function Tests  Recent Labs  07/07/15 1118 07/08/15 1156  AST 16 22  ALT 22 21  ALKPHOS 99 98  BILITOT 0.7 1.0  PROT 7.3 7.7  ALBUMIN 4.6 4.3    Recent Labs  07/07/15 1118 07/08/15 1156  LIPASE  --  22  AMYLASE 28  --    Cardiac Enzymes  Recent Labs  07/08/15 1215  TROPONINI <0.03    TELE  NSR  Radiology/Studies  Dg Chest 2 View  07/08/2015   CLINICAL DATA:  Irregular and increased heart rate.  EXAM: CHEST  2 VIEW  COMPARISON:  August 12, 2014, October 27, 2011  FINDINGS: The heart size and mediastinal contours  are stable. Right hilum is unchanged. Both lungs are clear. The visualized skeletal structures are unremarkable.  IMPRESSION: No active cardiopulmonary disease.   Electronically Signed   By: Abelardo Diesel M.D.   On: 07/08/2015 12:47   Ct Angio Abd/pel W/ And/or W/o  07/08/2015   CLINICAL DATA:  Acute abdominal pain with nausea and vomiting, concern for mesenteric ischemia.  EXAM: CT ANGIOGRAPHY ABDOMEN AND PELVIS  TECHNIQUE: Multidetector CT imaging of the abdomen and pelvis was performed using the standard protocol during bolus administration of intravenous contrast. Multiplanar reconstructed images including MIPs were obtained and reviewed to evaluate the vascular anatomy.  CONTRAST:  100 cc Omnipaque 350  COMPARISON:  05/23/2015  FINDINGS: Arterial findings:  Aorta: Mild tortuosity and atherosclerotic change of the abdominal aorta without aneurysm, occlusive process, or dissection.  Celiac axis:  Celiac origin demonstrates a moderate to severe stenosis estimated at 60-80%. This appears to be related to extrinsic compression from the overlying diaphragmatic crus indicative of median arcuate ligament syndrome. Despite this, the celiac axis and its branches remain patent. This is doubtful to be symptomatic because the superior and inferior mesenteric vessels remain patent.  Superior mesenteric: Widely patent origin. Main SMA trunk is widely patent throughout the mesenteric. Jejunal and colic branches are patent.  Left renal:           Widely patent.  Right renal:          Minor origin calcification, otherwise patent.  Inferior mesenteric:  Minor origin calcification but remains patent.  Left iliac: Minor iliac atherosclerosis. The left common, internal and external iliac arteries remain patent.  Right iliac: Symmetric minor atherosclerosis. The right common, internal and external iliac arteries remain patent.  The common femoral, proximal profunda femoral, and superficial femoral arteries demonstrated are all patent.  Venous findings: IVC, hepatic veins, portal, splenic, mesenteric, and renal veins are all patent. Pelvic iliac veins are patent. Visualized femoral veins are also patent. No veno-occlusive process.  Review of the MIP images confirms the above findings.  Nonvascular findings: Stable hypodense left hepatic cyst measuring 14 mm, image 15. No other hepatic abnormality or biliary dilatation. Gallbladder, biliary system, pancreas, spleen, and adrenal glands are within normal limits for age and demonstrate no acute process. Stable small hiatal hernia. Clear lung bases. No pericardial or pleural effusion. Minor basilar atelectasis.  Kidneys demonstrate stable intrarenal calculi bilaterally without obstruction or hydronephrosis. There is cortical scarring in the left kidney lower pole.  Negative for bowel obstruction, dilatation, ileus, or free air. No evidence of bowel wall thickening or pneumatosis. No  CT signs to suggest bowel ischemia. Terminal ileum unremarkable. Minor scattered colonic diverticulosis.  Stable benign pelvic calcifications. Uterus and adnexal normal in size. Urinary bladder unremarkable. No acute distal bowel process. Sigmoid diverticulosis evident. No inguinal abnormality or hernia. No pelvic fluid collection, hemorrhage, abscess or adenopathy.  Gluteal injection granulomas noted bilaterally.  Lower lumbar facet arthropathy evident diffusely.  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 See above comment.  SMA and IMA remain patent.  No CT signs of acute bowel ischemia, obstruction, ileus or free air  No significant aortoiliac occlusive process  Bilateral nephrolithiasis  Small hiatal hernia  Stable hepatic cyst  Diverticulosis   Electronically Signed   By: Jerilynn Mages.  Shick M.D.   On: 07/08/2015 17:12    ASSESSMENT AND PLAN    1. Paroxysmal Atrial Fibrillation with RVR -  Converted spontaneously to sinus rhythm yesterday. Maintaining sinus rhythm.  -  CHA2DS2-VASc Score of 2. Continue atenolol and cardizem CD 360mg . Continue eliquis. Rate in 50-60s.  - Still pending echocardiogram. Seems stable for discharge. Can get as outpatient if needed.  - had a negative nuclear study in December 2015  2. HTN - Improved - Continue home medication regimen  3. HLD - continue statin therapy  4. 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 Bilateral nephrolithiasis  Small hiatal hernia  Stable hepatic cyst  Diverticulosis  F/u with vascular surgery as outpatient.  -Tolerating meals.   5. Hypokalemia supplement given. Resolved.   Signed, Bhagat,Bhavinkumar PA-C Pager [phone]   I have  personally seen and examined this patient with B Bhagat, PA-C. I agree with the assessment and plan as outlined above. She is now in sinus. She is on Eliquis and cardizem. Exam with RRR, clear lungs, no LE edema. Labs reviewed. Echo this am then d/c home and and f/u with Dr. Meda Coffee. She will need f/u in Vascular Surgery to discuss findings involving celiac artery.   MCALHANY,CHRISTOPHER 07/10/2015 10:09 AM

## 2015-07-10 NOTE — Discharge Instructions (Signed)
Information on my medicine - ELIQUIS (apixaban)  This medication education was reviewed with me or my healthcare representative as part of my discharge preparation.  The pharmacist that spoke with me during my hospital stay was:  Romona Curls, [facility]  Why was Eliquis prescribed for you? Eliquis was prescribed for you to reduce the risk of a blood clot forming that can cause a stroke if you have a medical condition called atrial fibrillation (a type of irregular heartbeat).  What do You need to know about Eliquis ? Take your Eliquis TWICE DAILY - one tablet in the morning and one tablet in the evening with or without food. If you have difficulty swallowing the tablet whole please discuss with your pharmacist how to take the medication safely.  Take Eliquis exactly as prescribed by your doctor and DO NOT stop taking Eliquis without talking to the doctor who prescribed the medication.  Stopping may increase your risk of developing a stroke.  Refill your prescription before you run out.  After discharge, you should have regular check-up appointments with your healthcare provider that is prescribing your Eliquis.  In the future your dose may need to be changed if your kidney function or weight changes by a significant amount or as you get older.  What do you do if you miss a dose? If you miss a dose, take it as soon as you remember on the same day and resume taking twice daily.  Do not take more than one dose of ELIQUIS at the same time to make up a missed dose.  Important Safety Information A possible side effect of Eliquis is bleeding. You should call your healthcare provider right away if you experience any of the following: ? Bleeding from an injury or your nose that does not stop. ? Unusual colored urine (red or dark brown) or unusual colored stools (red or black). ? Unusual bruising for unknown reasons. ? A serious fall or if you hit your head (even if there is no bleeding).  Some  medicines may interact with Eliquis and might increase your risk of bleeding or clotting while on Eliquis. To help avoid this, consult your healthcare provider or pharmacist prior to using any new prescription or non-prescription medications, including herbals, vitamins, non-steroidal anti-inflammatory drugs (NSAIDs) and supplements.  This website has more information on Eliquis (apixaban): http://www.eliquis.com/eliquis/home  Anticoagulation, Generic Anticoagulants are medicines used to prevent clots from developing in your veins. These medicine are also known as blood thinners. If blood clots are untreated, they could travel to your lungs. This is called a pulmonary embolus. A blood clot in your lungs can be fatal.  Health care providers often use anticoagulants to prevent clots following surgery. Anticoagulants are also used along with aspirin when the heart is not getting enough blood. Another anticoagulant called warfarin is started 2 to 3 days after a rapid-acting injectable anticoagulant is started. The rapid-acting anticoagulants are usually continued until warfarin has begun to work. Your health care provider will judge this length of time by blood tests known as the prothrombin time (PT) and International Normalization Ratio (INR). This means that your blood is at the necessary and best level to prevent clots. RISKS AND COMPLICATIONS  If you have received recent epidural anesthesia, spinal anesthesia, or a spinal tap while receiving anticoagulants, you are at risk for developing a blood clot in or around the spine. This condition could result in long-term or permanent paralysis.  Because anticoagulants thin your blood, severe bleeding may  occur from any tissue or organ. Symptoms of the blood being too thin may include:  Bleeding from the nose or gums that does not stop quickly.  Blood in bowel movements which may appear as bright red, dark, or black tarry stools.  Blood in the urine  which may appear as pink, red, or brown urine.  Unusual bruising or bruising easily.  A cut that does not stop bleeding within 10 minutes.  Vomiting blood or continuous nausea for more than 1 day.  Coughing up blood.  Broken blood vessels in your eye (subconjunctival hemorrhage).  Abdominal or back pain with or without flank bruising.  Sudden, severe headache.  Sudden weakness or numbness of the face, arm, or leg, especially on one side of the body.  Sudden confusion.  Trouble speaking (aphasia) or understanding.  Sudden trouble seeing in one or both eyes.  Sudden trouble walking.  Dizziness.  Loss of balance or coordination.  Vaginal bleeding.  Swelling or pain at an injection site.  Superficial fat tissue death (necrosis) which may cause skin scarring. This is more common in women and may first present as pain in the waist, thighs, or buttocks.  Fever.  Too little anticoagulation continues to allow the risk for blood clots. HOME CARE INSTRUCTIONS   Due to the complications of anticoagulants, it is very important that you take your anticoagulant as directed by your health care provider. Anticoagulants need to be taken exactly as instructed. Be sure you understand all your anticoagulant instructions.  Keep all follow-up appointments with your health care provider as directed. It is very important to keep your appointments. Not keeping appointments could result in a chronic or permanent injury, pain, or disability.  Warfarin. Your health care provider will advise you on the length of treatment (usually 3-6 months, sometimes lifelong).  Take warfarin exactly as directed by your health care provider. It is recommended that you take your warfarin dose at the same time of the day. It is preferred that you take warfarin in the late afternoon. If you have been told to stop taking warfarin, do not resume taking warfarin until directed to do so by your health care provider.  Follow your health care provider's instructions if you accidentally take an extra dose or miss a dose of warfarin. It is very important to take warfarin as directed since bleeding or blood clots could result in chronic or permanent injury, pain, or disability.  Too much and too little warfarin are both dangerous. Too much warfarin increases the risk of bleeding. Too little warfarin continues to allow the risk for blood clots. While taking warfarin, you will need to have regular blood tests to measure your blood clotting time. These blood tests usually include both the prothrombin time (PT) and International Normalized Ratio (INR) tests. The PT and INR results allow your health care provider to adjust your dose of warfarin. The dose can change for many reasons. It is critically important that you have your PT and INR levels drawn exactly as directed. Your warfarin dose may stay the same or change depending on what the PT and INR results are. Be sure to follow up with your health care provider regarding your PT and INR test results and what your warfarin dosage should be.  Many medicines can interfere with warfarin and affect the PT and INR results. You must tell your health care provider about any and all medicines you take, this includes all vitamins and supplements. Ask your health care provider before  taking these. Prescription and over-the-counter medicine consistency is critical to warfarin management. It is important that potential interactions are checked before you start a new medicine. Be especially cautious with aspirin and anti-inflammatory medicines. Ask your health care provider before taking these. Medicines such as antibiotics and acid-reducing medicine can interact with warfarin and can cause an increased warfarin effect. Warfarin can also interfere with the effectiveness of medicines you are taking. Do not take or discontinue any prescribed or over-the-counter medicine except on the advice of  your health care provider or pharmacist.  Some vitamins, supplements, and herbal products interfere with the effectiveness of warfarin. Vitamin E may increase the anticoagulant effects of warfarin. Vitamin K may can cause warfarin to be less effective. Do not take or discontinue any vitamin, supplement, or herbal product except on the advice of your health care provider or pharmacist.  Eat what you normally eat and keep the vitamin K content of your diet consistent. Avoid major changes in your diet, or notify your health care provider before changing your diet. Suddenly getting a lot more vitamin K could cause your blood to clot too quickly. A sudden decrease in vitamin K intake could cause your blood to clot too slowly. These changes in vitamin K intake could lead to dangerous blood clotsor to bleeding. To keep your vitamin K intake consistent, you must be aware of which foods contain moderate or high amounts of vitamin K. Some foods high in vitamin K include spinach, kale, broccoli, cabbage, greens, Brussels sprouts, asparagus, Bok [place]oy, coleslaw, parsley, and green tea. Arrange a visit with a dietitian to answer your questions.  If you have a loss of appetite or get the stomach flu (viral gastroenteritis), talk to your health care provider as soon as possible. A decrease in your normal vitamin K intake can make you more sensitive to your usual dose of warfarin.  Some medical conditions may increase your risk for bleeding while you are taking warfarin. A fever, diarrhea lasting more than a day, worsening heart failure, or worsening liver function are some medical conditions that could affect warfarin. Contact your health care provider if you have any of these medical conditions.  Alcohol can change the body's ability to handle warfarin. It is best to avoid alcoholic drinks or consume only very small amounts while taking warfarin. Notify your health care provider if you change your alcohol intake. A  sudden increase in alcohol use can increase your risk of bleeding. Chronic alcohol use can cause warfarin to be less effective.  Be careful not to cut yourself when using sharp objects or while shaving.  Inform all your health care providers and your dentist that you take an anticoagulant.  Limit physical activities or sports that could result in a fall or cause injury. Avoid contact sports.  Wear medical alert jewelry or carry a medical alert card. SEEK IMMEDIATE MEDICAL CARE IF:  You cough up blood.  You have dark or black stools or there is bright red blood coming from your rectum.  You vomit blood or have nausea for more than 1 day.  You have blood in the urine or pink colored urine.  You have unusual bruising or have increased bruising.  You have bleeding from the nose or gums that does not stop quickly.  You have a cut that does not stop bleeding within a 2-3 minutes.  You have sudden weakness or numbness of the face, arm, or leg, especially on one side of the body.  You  have sudden confusion.  You have trouble speaking (aphasia) or understanding.  You have sudden trouble seeing in one or both eyes.  You have sudden trouble walking.  You have dizziness.  You have a loss of balance or coordination.  You have a sudden, severe headache.  You have a serious fall or head injury, even if you are not bleeding.  You have swelling or pain at an injection site.  You have unexplained tenderness or pain in the abdomen, back, waist, thighs or buttocks.  You have a fever. Any of these symptoms may represent a serious problem that is an emergency. Do not wait to see if the symptoms will go away. Get medical help right away. Call your local emergency services (911 in [place] Do not drive yourself to the hospital. Document Released: 10/04/2005 Document Revised: 10/09/2013 Document Reviewed: 05/08/2008 [facility] Patient Information 2015 [place], [place]. This information is not  intended to replace advice given to you by your health care provider. Make sure you discuss any questions you have with your health care provider.

## 2015-07-10 NOTE — Telephone Encounter (Signed)
-----   Message from Dalphine Handing, RN sent at 07/10/2015  2:01 PM EDT -----   ----- Message -----    From: Leanor Kail, PA    Sent: 07/10/2015   1:50 PM      To: Dalphine Handing, RN, [facility] Scheduling  Please schedule outpatient Vascular surgery follow up for celiac origin stenosis.   Pt of Dr. 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  Thanks  [place], [place]

## 2015-07-10 NOTE — Progress Notes (Signed)
  Echocardiogram 2D Echocardiogram has been performed.  Jennette Dubin 07/10/2015, 11:04 AM

## 2015-07-10 NOTE — Telephone Encounter (Signed)
Referral for [facility] placed for the pt to be scheduled for Vascular Surgery appointment for diagnosis per PA Bhagat, of celiac origin stenosis.  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 Bilateral nephrolithiasis. Small hiatal hernia Stable. hepatic cyst. Diverticulosis. Plan outpatient vascular surgery follow up.  [name] in [facility] aware to contact [facility] to make them aware that the referral is in and to contact the pt once she gets home from the hospital, to arrange appt with Vascular Surgeon for.

## 2015-07-10 NOTE — Discharge Summary (Signed)
Discharge Summary   Patient ID: [name],  MRN: [id number], DOB/AGE: 60-Jul-1957 60 y.o.  Admit date: 07/08/2015 Discharge date: 07/10/2015  Primary Care Provider: [facility] Primary Cardiologist: Dr. Meda Coffee  Discharge Diagnoses Active Problems:   Hypertension   Paroxysmal Afib   Viral gastroenteritis   Atrial fibrillation with RVR   Allergies Allergies  Allergen Reactions  . Penicillins Other (See Comments)    DOESN'T REMEMBER    Procedures Echo 07/10/15 LV EF: 60% -  65%  ------------------------------------------------------------------- Indications:   Atrial fibrillation - 427.31.  ------------------------------------------------------------------- History:  Risk factors: Hypertension.  ------------------------------------------------------------------- Study Conclusions  - Left ventricle: The cavity size was normal. Wall thickness was normal. Systolic function was normal. The estimated ejection fraction was in the range of 60% to 65%. Wall motion was normal; there were no regional wall motion abnormalities.  History of Present Illness  [name] is a 59 y.o. female with past medical history of HTN, HLD, and Paroxysmal Atrial fibrillation (diagnosed 10/2011) who presented to [facility] ED on 07/08/2015 for nausea, vomiting, and her chest "feeling jittery" since Sunday (07/06/2015).  The patient reported the symptoms started overnight and into the morning on Sunday. She initially had severe nausea and vomiting associated with some "aching" chest pain lasting a maximum of 5 minutes after the vomiting episodes. She reported later on Sunday afternoon, her vomiting continued and the symptoms of her chest feeling "jittery" started. She reported the feeling was similar to what she experienced in 2013 when she was initially diagnosed with atrial fibrillation.   She was given Zofran and Phenergan by her PCP on 07/07/2015 which has  helped with her nausea and vomiting but her chest kept feeling "jittery" so she presented to the ED 07/08/15.  Her initial EKG showed atrial fibrillation w/ RVR with rate in the 170's. While on telemetry she has been noted to be switching between Sinus tachycardia with rate in the 130's - 140's and atrial fibrillation with rates in the 120's - 150's.   Initial i-stat troponin was negative. Labs significant for K+ 2.7, Chloride 97, and benign CXR. CT of the abdomen and pelvis is pending to check for Mesenteric Ischemia.  Recent TSH normal.  The patient was last seen in office by Dr. Meda Coffee in 01/2015 and was doing well at that time. Patient had a negative nuclear study in December 2015 with EF of 61% and normal wall motion. Did have hypertensive response on exam so Cardizem CD was increased from 120mg  to 180mg  PO daily. Has only been on ASA 81mg  for anticoagulation due to only having one known episode of A-fib in the past in 2013.   Hospital Course  This patients CHA2DS2-VASc Score of 2 and started on Eliquis 5mg  BID. Discontinued Aspirin.  Her home dose of atenolol was continued. Increased Cardizem to 90 mg every 6 hours. Supplement given for low potassium. She converted spontaneously to sinus rhythm. Short acting Cardizem change to Cardizem CD 360mg . Echo showed normal LV function without WM abnormality.   Had vomit after dinner at day of admission. However no further episode since then. Tolerated food well. No further abdominal pain.  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 Bilateral nephrolithiasis. Small hiatal hernia Stable. hepatic cyst. Diverticulosis. Plan outpatient vascular surgery follow up.    She has been seen by Dr. Angelena Form today and  deemed ready for discharge home. All follow-up appointments have been  scheduled. Discharge medications are listed below. Scrip given for 1 free month supply of Eliquis. Consider checking potassium during outpatient visit. Discharge K was 4.4. Her dose of Kdur was continued.   Discharge Vitals Blood pressure 143/64, pulse 58, temperature 98.5 F (36.9 C), temperature source Oral, resp. rate 19, height 5\' 1"  (1.549 m), weight 181 lb (82.101 kg), SpO2 100 %.  Filed Weights   07/08/15 1850 07/09/15 0400 07/10/15 0517  Weight: 187 lb (84.823 kg) 183 lb (83.008 kg) 181 lb (82.101 kg)    Labs  CBC  Recent Labs  07/08/15 1156 07/10/15 0418  WBC 7.9 5.7  HGB 17.8* 15.8*  HCT 51.1* 46.0  MCV 90.1 90.2  PLT 297 093   Basic Metabolic Panel  Recent Labs  07/09/15 0348 07/10/15 0418  NA 135 134*  K 3.0* 4.4  CL 100* 100*  CO2 25 27  GLUCOSE 107* 85  BUN <5* 5*  CREATININE 0.71 0.64  CALCIUM 8.7* 9.4   Liver Function Tests  Recent Labs  07/08/15 1156  AST 22  ALT 21  ALKPHOS 98  BILITOT 1.0  PROT 7.7  ALBUMIN 4.3    Recent Labs  07/08/15 1156  LIPASE 22   Cardiac Enzymes  Recent Labs  07/08/15 1215  TROPONINI <0.03    Disposition  Pt is being discharged home today in good condition.  Follow-up Plans & Appointments  Follow-up Information    Follow up with THOMPSON, KATHRYN R, PA-C. Go on 07/24/2015.   Specialties:  Cardiology, Radiology   Why:  @2 :45 for cardiology followup   Contact information:   [place] [id number] [phone]       Please follow up.   Contact information:   Our office with call with vascular surgery appointment for celiac artery stenosis          Discharge Instructions    Diet - low sodium heart healthy    Complete by:  As directed      Increase activity slowly    Complete by:  As directed            F/u Labs/Studies  Discharge Medications    Medication List    STOP taking these medications        aspirin EC 81 MG tablet      TAKE these  medications        apixaban 5 MG Tabs tablet  Commonly known as:  ELIQUIS  Take 1 tablet (5 mg total) by mouth 2 (two) times daily.     ascorbic acid 1000 MG tablet  Commonly known as:  VITAMIN C  Take 1,000 mg by mouth daily.     atenolol 100 MG tablet  Commonly known as:  TENORMIN  Take 1 tablet (100 mg total) by mouth daily. Take 1/2-1 pill daily AD     atorvastatin 80 MG tablet  Commonly known as:  LIPITOR  Take 26.67 mg by mouth See admin instructions. Take 1/3 tablet by mouth three times a week on Tuesday, thursday, and saturday     diltiazem 360 MG 24 hr capsule  Commonly known as:  CARDIZEM CD  Take 1 capsule (360 mg total) by mouth daily.     doxepin 25 MG capsule  Commonly known as:  SINEQUAN  Take 1 capsule (25 mg total) by mouth at bedtime.     hydrOXYzine 10 MG tablet  Commonly known  as:  ATARAX/VISTARIL  Take 1 tablet (10 mg total) by mouth 3 (three) times daily as needed.     IRON COMPLEX PO  Take 1 tablet by mouth daily.     PATADAY 0.2 % Soln  Generic drug:  Olopatadine HCl  Apply 1 drop to eye daily.     potassium chloride SA 20 MEQ tablet  Commonly known as:  K-DUR,KLOR-CON  Take 1 tablet (20 mEq total) by mouth 2 (two) times daily.     promethazine 25 MG tablet  Commonly known as:  PHENERGAN  Take 1 tablet (25 mg total) by mouth every 6 (six) hours as needed for nausea or vomiting. Max: 4 tablets per day     triamcinolone cream 0.1 %  Commonly known as:  KENALOG  Apply 1 application topically as directed.     triamterene-hydrochlorothiazide 37.5-25 MG per tablet  Commonly known as:  MAXZIDE-25  Take 1 tablet by mouth daily.     VITAMIN D PO  Take 4,000 Units by mouth daily.        Duration of Discharge Encounter   Greater than 30 minutes including physician time.  Signed, Bhagat,Bhavinkumar PA-C 07/10/2015, 1:33 PM

## 2015-07-10 NOTE — Care Management Note (Addendum)
Case Management Note  Patient Details  Name: [name] MRN: [id number] Date of Birth: 02/24/1956  Subjective/Objective:    Pt admitted for PAF. Plan for d/c on Eliquis.                 Action/Plan: CM has benefits check in process for medication. Will make pt aware once completed. CM did provide pt with  30 day free and co pay card. MD please write Rx for 30 day free no refills and the original Rx with refills. No further needs from CM at this time.    Expected Discharge Date:                  Expected Discharge Plan:  Home/Self Care  In-House Referral:  NA  Discharge planning Services  CM Consult, Medication Assistance  Post Acute Care Choice:  NA Choice offered to:  NA  DME Arranged:  N/A DME Agency:  NA  HH Arranged:  NA HH Agency:  NA  Status of Service:  Completed, signed off  Medicare Important Message Given:    Date Medicare IM Given:    Medicare IM give by:    Date Additional Medicare IM Given:    Additional Medicare Important Message give by:     If discussed at [place] of Stay Meetings, dates discussed:    Additional Comments: 1206 07-10-15 [name], RN,BSN [phone] Per rep at [facility]:  Eliquis:  $64 30 day retail; $192 90 day mail order; no auth required   Patient can use most major retail Pharmacies-  Co pay card should bring price to $10.00. Pt is aware to activate card. No further needs from CM at this time.  [name], RN 07/10/2015, 10:21 AM

## 2015-07-11 ENCOUNTER — Other Ambulatory Visit: Payer: BC Managed Care – PPO

## 2015-07-11 ENCOUNTER — Telehealth: Payer: Self-pay | Admitting: Cardiology

## 2015-07-11 ENCOUNTER — Encounter: Payer: Self-pay | Admitting: *Deleted

## 2015-07-11 NOTE — Telephone Encounter (Signed)
Pt notified.  She would like to pick the letter up today in our office. I told her we would call her when letter signed by MD

## 2015-07-11 NOTE — Telephone Encounter (Signed)
Spoke with pt. She was not told when she left hospital when she could return to work.  She would like to return to work on Monday. 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 Other than having little strength she reports she is feeling well.  Will forward to Dr. Meda Coffee to determine when pt can return to work and if she has any restrictions.

## 2015-07-11 NOTE — Telephone Encounter (Signed)
New message      Pt was just discharged from the hosp.  She needs a note to return to work.  Please put on the note any restrictions.  Call pt when note is ready to be picked up.

## 2015-07-11 NOTE — Telephone Encounter (Signed)
Could you please write her a letter that she can return to work? I can sign it this pm.

## 2015-07-11 NOTE — Telephone Encounter (Signed)
Spoke with pt and informed her that I just had letter signed and I will take it up front for her to pick up. Pt verbalized understanding and was appreciative for call back.

## 2015-07-11 NOTE — Telephone Encounter (Signed)
Follow Up    Pt is calling to follow up on note to return back to work. Please call.

## 2015-07-24 ENCOUNTER — Ambulatory Visit (INDEPENDENT_AMBULATORY_CARE_PROVIDER_SITE_OTHER): Payer: BC Managed Care – PPO | Admitting: Physician Assistant

## 2015-07-24 ENCOUNTER — Encounter: Payer: Self-pay | Admitting: *Deleted

## 2015-07-24 ENCOUNTER — Other Ambulatory Visit: Payer: Self-pay | Admitting: Internal Medicine

## 2015-07-24 ENCOUNTER — Encounter: Payer: Self-pay | Admitting: Physician Assistant

## 2015-07-24 VITALS — BP 116/60 | HR 62 | Ht 61.0 in | Wt 192.0 lb

## 2015-07-24 DIAGNOSIS — E876 Hypokalemia: Secondary | ICD-10-CM

## 2015-07-24 DIAGNOSIS — I774 Celiac artery compression syndrome: Secondary | ICD-10-CM | POA: Diagnosis not present

## 2015-07-24 DIAGNOSIS — I771 Stricture of artery: Secondary | ICD-10-CM

## 2015-07-24 DIAGNOSIS — I480 Paroxysmal atrial fibrillation: Secondary | ICD-10-CM | POA: Diagnosis not present

## 2015-07-24 MED ORDER — HYDROXYZINE HCL 10 MG PO TABS: 10.0000 mg | ORAL_TABLET | Freq: Three times a day (TID) | ORAL | Status: DC | PRN

## 2015-07-24 NOTE — Patient Instructions (Addendum)
Medication Instructions:   Your physician recommends that you continue on your current medications as directed. Please refer to the Current Medication list given to you today.   Labwork:  BMET TODAY   Testing/Procedures:  NONE ORDER TODAY   Follow-Up:  IN 3 TO 4 MONTHS WITH DR NELSON   YOU HAVE BEEN REFFERED TO VASCULAR SURGERY   Any Other Special Instructions Will Be Listed Below (If Applicable).

## 2015-07-24 NOTE — Progress Notes (Signed)
Cardiology Office Note    Date:  07/24/2015   ID:  [name], DOB Jul 09, 1956, MRN [id number]  PCP:  Alesia Richards, MD  Cardiologist: Dr. Meda Coffee   CC: post hospital follow up  History of Present Illness:   [name] is a 59 y.o. female with past medical history of HTN, HLD, and Paroxysmal Atrial fibrillation (diagnosed 10/2011) who presents for post hospital follow up after a recent admission for atrial fib with RVR.    The patient was last seen in office by Dr. Meda Coffee in 01/2015 and was doing well at that time. Patient had a negative nuclear study in December 2015 with EF of 61% and normal wall motion. Did have hypertensive response on exam so Cardizem CD was increased from 120mg  to 180mg  PO daily. Has only been on ASA 81mg  for anticoagulation due to only having one known episode of A-fib in the past in 2013.   She was admitted to [facility] 07/08/15-07/10/15 for afib w/ RVR. She presented with n/v, feeling shakey and "aching" chest pain. This was typical sx of her afib. Her initial EKG showed atrial fibrillation w/ RVR's in 170s. She was treated with diltiazem which was later converted to the long acting form at 360 mg. She spontaneously converted back into NSR. She was placed on eliquis. 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 Outpatient vascular surgery referral was recommended.  Today she presents with for post hospital follow up. She has been feeling well with no further episodes of palpations. No CP, SOB, orthopnea, PND, dizziness or LE edema. She is feeling quite well. No complaints   Studies:  - Echo (07/10/15): EF 60-65%, normal wall motion.  - Ett Nuclear (10/08/14):  Normal nuclear study. EF 31%, hypertensive response to exercise   Recent  Labs/Images:   Recent Labs  04/28/15 1057  07/08/15 1156  07/10/15 0418  NA 142  < > 135  < > 134*  K 3.9  < > 2.7*  < > 4.4  BUN 11  < > 6  < > 5*  CREATININE 0.78  < > 0.85  < > 0.64  ALT 15  < > 21  --   --   HGB 13.7  < > 17.8*  --  15.8*  TSH 1.062  --   --   --   --   LDLCALC 78  --   --   --   --   HDL 69  --   --   --   --   < > = values in this interval not displayed.   Dg Chest 2 View  07/08/2015   CLINICAL DATA:  Irregular and increased heart rate.  EXAM: CHEST  2 VIEW  COMPARISON:  August 12, 2014, October 27, 2011  FINDINGS: The heart size and mediastinal contours are stable. Right hilum is unchanged. Both lungs are clear. The visualized skeletal structures are unremarkable.  IMPRESSION: No active cardiopulmonary disease.   Electronically Signed   By: Abelardo Diesel M.D.   On: 07/08/2015 12:47   Ct Angio Abd/pel W/ And/or W/o  07/08/2015   CLINICAL DATA:  Acute abdominal pain with nausea and vomiting, concern for mesenteric ischemia.  EXAM: CT ANGIOGRAPHY ABDOMEN AND PELVIS  TECHNIQUE: Multidetector CT imaging of the abdomen and  pelvis was performed using the standard protocol during bolus administration of intravenous contrast. Multiplanar reconstructed images including MIPs were obtained and reviewed to evaluate the vascular anatomy.  CONTRAST:  100 cc Omnipaque 350  COMPARISON:  05/23/2015  FINDINGS: Arterial findings:  Aorta: Mild tortuosity and atherosclerotic change of the abdominal aorta without aneurysm, occlusive process, or dissection.  Celiac axis: Celiac origin demonstrates a moderate to severe stenosis estimated at 60-80%. This appears to be related to extrinsic compression from the overlying diaphragmatic crus indicative of median arcuate ligament syndrome. Despite this, the celiac axis and its branches remain patent. This is doubtful to be symptomatic because the superior and inferior mesenteric vessels remain patent.  Superior mesenteric: Widely patent origin. Main  SMA trunk is widely patent throughout the mesenteric. Jejunal and colic branches are patent.  Left renal:           Widely patent.  Right renal:          Minor origin calcification, otherwise patent.  Inferior mesenteric:  Minor origin calcification but remains patent.  Left iliac: Minor iliac atherosclerosis. The left common, internal and external iliac arteries remain patent.  Right iliac: Symmetric minor atherosclerosis. The right common, internal and external iliac arteries remain patent.  The common femoral, proximal profunda femoral, and superficial femoral arteries demonstrated are all patent.  Venous findings: IVC, hepatic veins, portal, splenic, mesenteric, and renal veins are all patent. Pelvic iliac veins are patent. Visualized femoral veins are also patent. No veno-occlusive process.  Review of the MIP images confirms the above findings.  Nonvascular findings: Stable hypodense left hepatic cyst measuring 14 mm, image 15. No other hepatic abnormality or biliary dilatation. Gallbladder, biliary system, pancreas, spleen, and adrenal glands are within normal limits for age and demonstrate no acute process. Stable small hiatal hernia. Clear lung bases. No pericardial or pleural effusion. Minor basilar atelectasis.  Kidneys demonstrate stable intrarenal calculi bilaterally without obstruction or hydronephrosis. There is cortical scarring in the left kidney lower pole.  Negative for bowel obstruction, dilatation, ileus, or free air. No evidence of bowel wall thickening or pneumatosis. No CT signs to suggest bowel ischemia. Terminal ileum unremarkable. Minor scattered colonic diverticulosis.  Stable benign pelvic calcifications. Uterus and adnexal normal in size. Urinary bladder unremarkable. No acute distal bowel process. Sigmoid diverticulosis evident. No inguinal abnormality or hernia. No pelvic fluid collection, hemorrhage, abscess or adenopathy.  Gluteal injection granulomas noted bilaterally.  Lower lumbar  facet arthropathy evident diffusely.  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 See above comment.  SMA and IMA remain patent.  No CT signs of acute bowel ischemia, obstruction, ileus or free air  No significant aortoiliac occlusive process  Bilateral nephrolithiasis  Small hiatal hernia  Stable hepatic cyst  Diverticulosis   Electronically Signed   By: Jerilynn Mages.  Shick M.D.   On: 07/08/2015 17:12     Wt Readings from Last 3 Encounters:  07/24/15 192 lb (87.091 kg)  07/10/15 181 lb (82.101 kg)  07/07/15 187 lb 3.2 oz (84.913 kg)     Past Medical History  Diagnosis Date  . MRSA (methicillin resistant staph aureus) culture positive   . Eczema   . Hypercholesteremia   . Hypertension   . Kidney stones   . Anemia   . Vitamin D deficiency   . Paroxysmal atrial fibrillation ([place]) 10/2011, 06/2015  . GERD (gastroesophageal reflux disease)  Current Outpatient Prescriptions  Medication Sig Dispense Refill  . apixaban (ELIQUIS) 5 MG TABS tablet Take 1 tablet (5 mg total) by mouth 2 (two) times daily. 60 tablet 11  . ascorbic acid (VITAMIN C) 1000 MG tablet Take 1,000 mg by mouth daily.    Marland Kitchen atenolol (TENORMIN) 100 MG tablet Take 1 pill by mouth daily AD    . atorvastatin (LIPITOR) 80 MG tablet Take 26.67 mg by mouth as directed. Take 1/3 tablet by mouth three times a week on Tuesday, thursday, and saturday    . Cholecalciferol (VITAMIN D PO) Take 4,000 Units by mouth daily.     Marland Kitchen diltiazem (CARDIZEM CD) 360 MG 24 hr capsule Take 1 capsule (360 mg total) by mouth daily. 30 capsule 11  . doxepin (SINEQUAN) 25 MG capsule Take 1 capsule (25 mg total) by mouth at bedtime. 30 capsule 5  . Iron Combinations (IRON COMPLEX PO) Take 1 tablet by mouth daily.    Marland Kitchen PAZEO 0.7 % SOLN Place 1 drop into both eyes daily.    . potassium chloride SA (K-DUR,KLOR-CON) 20 MEQ tablet  Take 1 tablet (20 mEq total) by mouth 2 (two) times daily. 180 tablet 6  . triamcinolone cream (KENALOG) 0.1 % Apply 1 application topically as directed.     . triamterene-hydrochlorothiazide (MAXZIDE-25) 37.5-25 MG per tablet Take 1 tablet by mouth daily. 30 tablet 6   No current facility-administered medications for this visit.     Allergies:   Penicillins   Social History:  The patient  reports that she has never smoked. She has never used smokeless tobacco. She reports that she does not drink alcohol or use illicit drugs.   Family History:  The patient's family history includes Dementia in her father; Heart disease in her father; Hepatitis C in her mother; Hypertension in her mother.   ROS:  Please see the history of present illness.  All other systems reviewed and negative.    PHYSICAL EXAM: VS:  BP 116/60 mmHg  Pulse 62  Ht 5\' 1"  (1.549 m)  Wt 192 lb (87.091 kg)  BMI 36.30 kg/m2 Well nourished, well developed, in no acute distress HEENT: normal Neck: no JVD Cardiac:  normal S1, S2; RRR; no murmur Lungs:  clear to auscultation bilaterally, no wheezing, rhonchi or rales Abd: soft, nontender, no hepatomegaly Ext: no edema Skin: warm and dry Neuro:  CNs 2-12 intact, no focal abnormalities noted  EKG:  HR 62  NSR   ASSESSMENT AND PLAN:  [name] is a 59 y.o. female with past medical history of HTN, HLD, and Paroxysmal Atrial fibrillation (diagnosed 10/2011) who presents for post hospital follow up after a recent admission for atrial fib with RVR.    PAF- recent admission for afib with RVR. She  converted spontaneously to sinus rhythm. Today she remains in NSR. HR 62. -- CHA2DS2-VASc Score of 2 and started on Eliquis 5mg  BID. Discontinued Aspirin.  -- Her home dose of atenolol was continued. Started on Cardizem CD 360mg . -- Echo showed normal LV function without WM abnormality.   Celiac artery stenosis- incidental finding on abdominal CT done for n/v at  presentation.  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  -- Plan outpatient vascular surgery follow up. I will have this referral set up   HLD- continue statin   HTN- Bp well  controlled 116/60 mm Hg  -- Continue Maxide 37.5-25, Cardizem 360mg , atenelol 100mg  qd   Hypokalemia- she had some hypokalemia during her admission likely 2/2 n/v. This was supplemented. She was then resumed on her home maxide and Kdur 5mEq BID ( prescribed by her nephrologist for kidney stones?). I will check a BMET today to make sure she has not been over supplemented as her discharge K was 4.4.  Disposition:   FU with Dr. Meda Coffee 3-4 months   Signed, Vesta Mixer, PA-C, MHS 07/24/2015 3:16 PM    [facility] [place], [place], [place]  [address] Phone: [phone]; Fax: [phone]

## 2015-07-25 ENCOUNTER — Other Ambulatory Visit: Payer: Self-pay

## 2015-07-25 ENCOUNTER — Telehealth: Payer: Self-pay | Admitting: *Deleted

## 2015-07-25 LAB — BASIC METABOLIC PANEL
BUN: 8 mg/dL (ref 7–25)
CALCIUM: 9.6 mg/dL (ref 8.6–10.4)
CHLORIDE: 106 mmol/L (ref 98–110)
CO2: 16 mmol/L — AB (ref 20–31)
CREATININE: 0.78 mg/dL (ref 0.50–0.99)
Glucose, Bld: 46 mg/dL — ABNORMAL LOW (ref 65–99)
Potassium: 4.2 mmol/L (ref 3.5–5.3)
Sodium: 143 mmol/L (ref 135–146)

## 2015-07-25 MED ORDER — TRIAMCINOLONE ACETONIDE 0.1 % EX CREA: 1.0000 "application " | TOPICAL_CREAM | CUTANEOUS | Status: DC

## 2015-07-25 NOTE — Telephone Encounter (Signed)
New message   Pt wants to ask questions about her PCP and a cream for her face

## 2015-07-25 NOTE — Telephone Encounter (Signed)
-----   Message from Eileen Stanford, PA-C sent at 07/25/2015  5:09 AM EDT ----- Can you let her know that her potassium looks good. No change in plans. Thanks!

## 2015-07-29 ENCOUNTER — Encounter: Payer: Self-pay | Admitting: Internal Medicine

## 2015-07-29 ENCOUNTER — Ambulatory Visit (INDEPENDENT_AMBULATORY_CARE_PROVIDER_SITE_OTHER): Payer: BC Managed Care – PPO | Admitting: Internal Medicine

## 2015-07-29 VITALS — BP 122/74 | HR 68 | Temp 98.0°F | Resp 16 | Ht 62.0 in | Wt 189.0 lb

## 2015-07-29 DIAGNOSIS — E559 Vitamin D deficiency, unspecified: Secondary | ICD-10-CM | POA: Diagnosis not present

## 2015-07-29 DIAGNOSIS — Z79899 Other long term (current) drug therapy: Secondary | ICD-10-CM | POA: Diagnosis not present

## 2015-07-29 DIAGNOSIS — I10 Essential (primary) hypertension: Secondary | ICD-10-CM

## 2015-07-29 DIAGNOSIS — E782 Mixed hyperlipidemia: Secondary | ICD-10-CM | POA: Diagnosis not present

## 2015-07-29 DIAGNOSIS — I480 Paroxysmal atrial fibrillation: Secondary | ICD-10-CM

## 2015-07-29 DIAGNOSIS — R7303 Prediabetes: Secondary | ICD-10-CM | POA: Diagnosis not present

## 2015-07-29 LAB — CBC WITH DIFFERENTIAL/PLATELET
BASOS ABS: 0.1 10*3/uL (ref 0.0–0.1)
BASOS PCT: 1 % (ref 0–1)
EOS PCT: 5 % (ref 0–5)
Eosinophils Absolute: 0.3 10*3/uL (ref 0.0–0.7)
HCT: 41.1 % (ref 36.0–46.0)
HEMOGLOBIN: 13.8 g/dL (ref 12.0–15.0)
LYMPHS PCT: 22 % (ref 12–46)
Lymphs Abs: 1.3 10*3/uL (ref 0.7–4.0)
MCH: 30.1 pg (ref 26.0–34.0)
MCHC: 33.6 g/dL (ref 30.0–36.0)
MCV: 89.5 fL (ref 78.0–100.0)
MONO ABS: 0.8 10*3/uL (ref 0.1–1.0)
MPV: 9.5 fL (ref 8.6–12.4)
Monocytes Relative: 14 % — ABNORMAL HIGH (ref 3–12)
NEUTROS ABS: 3.3 10*3/uL (ref 1.7–7.7)
Neutrophils Relative %: 58 % (ref 43–77)
Platelets: 353 10*3/uL (ref 150–400)
RBC: 4.59 MIL/uL (ref 3.87–5.11)
RDW: 14.6 % (ref 11.5–15.5)
WBC: 5.7 10*3/uL (ref 4.0–10.5)

## 2015-07-29 NOTE — Progress Notes (Signed)
Patient ID: [name], female   DOB: Nov 16, 1955, 59 y.o.   MRN: [id number]  Assessment and Plan:  Hypertension:  -Continue medication,  -monitor blood pressure at home.  -Continue DASH diet.   -Reminder to go to the ER if any CP, SOB, nausea, dizziness, severe HA, changes vision/speech, left arm numbness and tingling, and jaw pain.  Cholesterol: -Continue diet and exercise.  -Check cholesterol.   Pre-diabetes: -Continue diet and exercise.  -Check A1C  Vitamin D Def: -check level -continue medications.   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 Fibrillation -cont cardizem -cont eliquis -followed by Dr. Meda Coffee  Continue diet and meds as discussed. Further disposition pending results of labs.  HPI 59 y.o. female  presents for 3 month follow up with hypertension, hyperlipidemia, prediabetes and vitamin D.   Her blood pressure has been controlled at home, today their BP is BP: 122/74 mmHg.   She does workout. She denies chest pain, shortness of breath, dizziness.   She is on cholesterol medication and denies myalgias. Her cholesterol is at goal. The cholesterol last visit was:   Lab Results  Component Value Date   CHOL 157 04/28/2015   HDL 69 04/28/2015   LDLCALC 78 04/28/2015   TRIG 49 04/28/2015   CHOLHDL 2.3 04/28/2015     She has been working on diet and exercise for prediabetes, and denies foot ulcerations, hyperglycemia, hypoglycemia , increased appetite, nausea, paresthesia of the feet, polydipsia, polyuria, visual disturbances, vomiting and weight loss. Last A1C in the office was:  Lab Results  Component Value Date   HGBA1C 5.7* 04/28/2015    Patient is on Vitamin D supplement.  Lab Results  Component Value Date   VD25OH 53 04/28/2015     Patient did present to the ER for Chest pain, dizziness, and vomiting.  She was found to be in A. Fib with RVR and while being hospitalized was found to have a blockage of the celiac  artery.  She is due to see vascular surgery.  She is taking eliquis currently.     Current Medications:  Current Outpatient Prescriptions on File Prior to Visit  Medication Sig Dispense Refill  . apixaban (ELIQUIS) 5 MG TABS tablet Take 1 tablet (5 mg total) by mouth 2 (two) times daily. 60 tablet 11  . ascorbic acid (VITAMIN C) 1000 MG tablet Take 1,000 mg by mouth daily.    Marland Kitchen atenolol (TENORMIN) 100 MG tablet Take 1 pill by mouth daily AD    . atorvastatin (LIPITOR) 80 MG tablet Take 26.67 mg by mouth as directed. Take 1/3 tablet by mouth three times a week on Tuesday, thursday, and saturday    . Cholecalciferol (VITAMIN D PO) Take 4,000 Units by mouth daily.     Marland Kitchen diltiazem (CARDIZEM CD) 360 MG 24 hr capsule Take 1 capsule (360 mg total) by mouth daily. 30 capsule 11  . doxepin (SINEQUAN) 25 MG capsule Take 1 capsule (25 mg total) by mouth at bedtime. 30 capsule 5  . Iron Combinations (IRON COMPLEX PO) Take 1 tablet by mouth daily.    Marland Kitchen PAZEO 0.7 % SOLN Place 1 drop into both eyes daily.    . potassium chloride SA (K-DUR,KLOR-CON) 20 MEQ tablet Take 1 tablet (20 mEq total) by mouth 2 (two) times daily. 180 tablet 6  . triamcinolone cream (KENALOG) 0.1 % Apply 1 application topically as directed. 30 g 1  . triamterene-hydrochlorothiazide (MAXZIDE-25) 37.5-25 MG per  tablet Take 1 tablet by mouth daily. 30 tablet 6   No current facility-administered medications on file prior to visit.    Medical History:  Past Medical History  Diagnosis Date  . MRSA (methicillin resistant staph aureus) culture positive   . Eczema   . Hypercholesteremia   . Hypertension   . Kidney stones   . Anemia   . Vitamin D deficiency   . Paroxysmal atrial fibrillation ([place]) 10/2011, 06/2015  . GERD (gastroesophageal reflux disease)     Allergies:  Allergies  Allergen Reactions  . Penicillins Other (See Comments)    PT. DOESN'T REMEMBER     Review of Systems:  Review of Systems  Constitutional:  Negative for fever, chills and malaise/fatigue.  HENT: Negative for congestion, ear pain and sore throat.   Eyes: Negative.   Respiratory: Negative for cough, shortness of breath and wheezing.   Cardiovascular: Positive for palpitations. Negative for chest pain and leg swelling.  Gastrointestinal: Negative for abdominal pain, diarrhea, constipation, blood in stool and melena.  Genitourinary: Negative.   Skin: Positive for rash.  Neurological: Negative for dizziness, sensory change, loss of consciousness and headaches.  Psychiatric/Behavioral: Negative for depression. The patient is not nervous/anxious and does not have insomnia.     Family history- Review and unchanged  Social history- Review and unchanged  Physical Exam: BP 122/74 mmHg  Pulse 68  Temp(Src) 98 F (36.7 C) (Temporal)  Resp 16  Ht 5\' 2"  (1.575 m)  Wt 189 lb (85.73 kg)  BMI 34.56 kg/m2 Wt Readings from Last 3 Encounters:  07/29/15 189 lb (85.73 kg)  07/24/15 192 lb (87.091 kg)  07/10/15 181 lb (82.101 kg)    General Appearance: Well nourished well developed, in no apparent distress. Eyes: PERRLA, EOMs, conjunctiva no swelling or erythema ENT/Mouth: Ear canals normal without obstruction, swelling, erythma, discharge.  TMs normal bilaterally.  Oropharynx moist, clear, without exudate, or postoropharyngeal swelling. Neck: Supple, thyroid normal,no cervical adenopathy  Respiratory: Respiratory effort normal, Breath sounds clear A&P without rhonchi, wheeze, or rale.  No retractions, no accessory usage. Cardio: RRR with no MRGs. Brisk peripheral pulses without edema.  Abdomen: Soft, + BS,  Non tender, no guarding, rebound, hernias, masses. Musculoskeletal: Full ROM, 5/5 strength, Normal gait Skin: Warm, dry without rashes, lesions, ecchymosis.  Neuro: Awake and oriented X 3, Cranial nerves intact. Normal muscle tone, no cerebellar symptoms. Psych: Normal affect, Insight and Judgment appropriate.    Starlyn Skeans, PA-C 4:47 PM [facility]

## 2015-07-30 LAB — HEPATIC FUNCTION PANEL
ALK PHOS: 82 U/L (ref 33–130)
ALT: 11 U/L (ref 6–29)
AST: 14 U/L (ref 10–35)
Albumin: 4.1 g/dL (ref 3.6–5.1)
BILIRUBIN DIRECT: 0.1 mg/dL (ref <=0.2)
BILIRUBIN INDIRECT: 0.2 mg/dL (ref 0.2–1.2)
Total Bilirubin: 0.3 mg/dL (ref 0.2–1.2)
Total Protein: 6.6 g/dL (ref 6.1–8.1)

## 2015-07-30 LAB — TSH: TSH: 0.675 u[IU]/mL (ref 0.350–4.500)

## 2015-07-30 LAB — BASIC METABOLIC PANEL WITH GFR
BUN: 14 mg/dL (ref 7–25)
CALCIUM: 9.6 mg/dL (ref 8.6–10.4)
CO2: 27 mmol/L (ref 20–31)
Chloride: 102 mmol/L (ref 98–110)
Creat: 0.71 mg/dL (ref 0.50–0.99)
Glucose, Bld: 75 mg/dL (ref 65–99)
POTASSIUM: 4.2 mmol/L (ref 3.5–5.3)
SODIUM: 141 mmol/L (ref 135–146)

## 2015-07-30 LAB — LIPID PANEL
CHOL/HDL RATIO: 3.5 ratio (ref <=5.0)
CHOLESTEROL: 198 mg/dL (ref 125–200)
HDL: 57 mg/dL (ref >=46)
LDL Cholesterol: 123 mg/dL (ref <130)
TRIGLYCERIDES: 89 mg/dL (ref <150)
VLDL: 18 mg/dL (ref <30)

## 2015-07-30 LAB — HEMOGLOBIN A1C
Hgb A1c MFr Bld: 5.8 % — ABNORMAL HIGH (ref <5.7)
Mean Plasma Glucose: 120 mg/dL — ABNORMAL HIGH (ref <117)

## 2015-08-03 ENCOUNTER — Encounter (HOSPITAL_COMMUNITY): Payer: Self-pay | Admitting: Emergency Medicine

## 2015-08-03 ENCOUNTER — Emergency Department (HOSPITAL_COMMUNITY): Payer: BC Managed Care – PPO

## 2015-08-03 ENCOUNTER — Emergency Department (HOSPITAL_COMMUNITY)
Admission: EM | Admit: 2015-08-03 | Discharge: 2015-08-03 | Disposition: A | Discharge status: Home / Self Care | Payer: BC Managed Care – PPO | Attending: Emergency Medicine | Admitting: Emergency Medicine

## 2015-08-03 DIAGNOSIS — Z7902 Long term (current) use of antithrombotics/antiplatelets: Secondary | ICD-10-CM | POA: Diagnosis not present

## 2015-08-03 DIAGNOSIS — E559 Vitamin D deficiency, unspecified: Secondary | ICD-10-CM | POA: Insufficient documentation

## 2015-08-03 DIAGNOSIS — D649 Anemia, unspecified: Secondary | ICD-10-CM | POA: Insufficient documentation

## 2015-08-03 DIAGNOSIS — E7800 Pure hypercholesterolemia, unspecified: Secondary | ICD-10-CM | POA: Insufficient documentation

## 2015-08-03 DIAGNOSIS — R319 Hematuria, unspecified: Secondary | ICD-10-CM

## 2015-08-03 DIAGNOSIS — R079 Chest pain, unspecified: Secondary | ICD-10-CM | POA: Diagnosis present

## 2015-08-03 DIAGNOSIS — Z8614 Personal history of Methicillin resistant Staphylococcus aureus infection: Secondary | ICD-10-CM | POA: Diagnosis not present

## 2015-08-03 DIAGNOSIS — Z87442 Personal history of urinary calculi: Secondary | ICD-10-CM | POA: Diagnosis not present

## 2015-08-03 DIAGNOSIS — I480 Paroxysmal atrial fibrillation: Secondary | ICD-10-CM

## 2015-08-03 DIAGNOSIS — Z792 Long term (current) use of antibiotics: Secondary | ICD-10-CM | POA: Insufficient documentation

## 2015-08-03 DIAGNOSIS — Z880 Allergy status to penicillin: Secondary | ICD-10-CM | POA: Insufficient documentation

## 2015-08-03 DIAGNOSIS — K559 Vascular disorder of intestine, unspecified: Secondary | ICD-10-CM | POA: Insufficient documentation

## 2015-08-03 DIAGNOSIS — I10 Essential (primary) hypertension: Secondary | ICD-10-CM | POA: Diagnosis not present

## 2015-08-03 DIAGNOSIS — R109 Unspecified abdominal pain: Secondary | ICD-10-CM | POA: Insufficient documentation

## 2015-08-03 DIAGNOSIS — Z79899 Other long term (current) drug therapy: Secondary | ICD-10-CM | POA: Diagnosis not present

## 2015-08-03 DIAGNOSIS — Z872 Personal history of diseases of the skin and subcutaneous tissue: Secondary | ICD-10-CM | POA: Insufficient documentation

## 2015-08-03 DIAGNOSIS — R0789 Other chest pain: Secondary | ICD-10-CM

## 2015-08-03 DIAGNOSIS — K551 Chronic vascular disorders of intestine: Secondary | ICD-10-CM

## 2015-08-03 LAB — LIPASE, BLOOD: Lipase: 24 U/L (ref 22–51)

## 2015-08-03 LAB — URINALYSIS, ROUTINE W REFLEX MICROSCOPIC
BILIRUBIN URINE: NEGATIVE
GLUCOSE, UA: NEGATIVE mg/dL
KETONES UR: 15 mg/dL — AB
NITRITE: NEGATIVE
PH: 6.5 (ref 5.0–8.0)
Protein, ur: 30 mg/dL — AB
SPECIFIC GRAVITY, URINE: 1.017 (ref 1.005–1.030)
Urobilinogen, UA: 0.2 mg/dL (ref 0.0–1.0)

## 2015-08-03 LAB — BASIC METABOLIC PANEL
Anion gap: 13 (ref 5–15)
BUN: 14 mg/dL (ref 6–20)
CO2: 26 mmol/L (ref 22–32)
CREATININE: 0.91 mg/dL (ref 0.44–1.00)
Calcium: 10.1 mg/dL (ref 8.9–10.3)
Chloride: 101 mmol/L (ref 101–111)
GFR calc Af Amer: >60 mL/min (ref >60)
GFR calc non Af Amer: >60 mL/min (ref >60)
GLUCOSE: 98 mg/dL (ref 65–99)
Potassium: 3.7 mmol/L (ref 3.5–5.1)
SODIUM: 140 mmol/L (ref 135–145)

## 2015-08-03 LAB — URINE MICROSCOPIC-ADD ON

## 2015-08-03 LAB — CBC
HEMATOCRIT: 42 % (ref 36.0–46.0)
Hemoglobin: 13.9 g/dL (ref 12.0–15.0)
MCH: 30.5 pg (ref 26.0–34.0)
MCHC: 33.1 g/dL (ref 30.0–36.0)
MCV: 92.1 fL (ref 78.0–100.0)
PLATELETS: 291 10*3/uL (ref 150–400)
RBC: 4.56 MIL/uL (ref 3.87–5.11)
RDW: 14.6 % (ref 11.5–15.5)
WBC: 6.1 10*3/uL (ref 4.0–10.5)

## 2015-08-03 LAB — HEPATIC FUNCTION PANEL
ALK PHOS: 88 U/L (ref 38–126)
ALT: 15 U/L (ref 14–54)
AST: 20 U/L (ref 15–41)
Albumin: 3.9 g/dL (ref 3.5–5.0)
BILIRUBIN DIRECT: 0.1 mg/dL (ref 0.1–0.5)
BILIRUBIN INDIRECT: 0.5 mg/dL (ref 0.3–0.9)
BILIRUBIN TOTAL: 0.6 mg/dL (ref 0.3–1.2)
Total Protein: 6.7 g/dL (ref 6.5–8.1)

## 2015-08-03 LAB — I-STAT CG4 LACTIC ACID, ED: Lactic Acid, Venous: 1.04 mmol/L (ref 0.5–2.0)

## 2015-08-03 LAB — I-STAT TROPONIN, ED
TROPONIN I, POC: 0 ng/mL (ref 0.00–0.08)
Troponin i, poc: 0 ng/mL (ref 0.00–0.08)

## 2015-08-03 MED ORDER — ASPIRIN 81 MG PO CHEW
324.0000 mg | CHEWABLE_TABLET | Freq: Once | ORAL | Status: AC
  Administered 2015-08-03: 324 mg via ORAL
  Filled 2015-08-03: qty 4

## 2015-08-03 MED ORDER — TRAMADOL HCL 50 MG PO TABS
50.0000 mg | ORAL_TABLET | Freq: Once | ORAL | Status: AC
  Administered 2015-08-03: 50 mg via ORAL
  Filled 2015-08-03: qty 1

## 2015-08-03 MED ORDER — CEPHALEXIN 500 MG PO CAPS: 500.0000 mg | ORAL_CAPSULE | Freq: Four times a day (QID) | ORAL | Status: DC

## 2015-08-03 MED ORDER — TRAMADOL HCL 50 MG PO TABS
50.0000 mg | ORAL_TABLET | Freq: Four times a day (QID) | ORAL | Status: DC | PRN
Start: 2015-08-03 — End: 2015-12-17

## 2015-08-03 NOTE — ED Provider Notes (Signed)
CSN: [id number]     Arrival date & time 08/03/15  0327 History  By signing my name below, I, Irene Pap, attest that this documentation has been prepared under the direction and in the presence of Julianne Rice, MD. Electronically Signed: Irene Pap, ED Scribe. 08/03/2015. 4:09 AM.   Chief Complaint  Patient presents with  . Chest Pain   The history is provided by the patient. No language interpreter was used.   HPI Comments: CEDRIC DENISON is a 59 y.o. female with hx of paroxysmal AFib who presents to the Emergency Department complaining of chest pain onset 2 hours ago. Pt states that the chest pain woke her from sleep with associated bilateral leg pain and bilateral feet numbness, with LLQ abdominal pain. She states that it felt like her heart was racing and that the heartbeat was jumping around. She states that the heart racing and chest pain has since resolved, but that "I just don't feel right." Pt states that she typically has leg pain from ambulation and will usually have leg swelling, but denies current leg swelling. She denies having symptoms prior to going to bed. She denies fever, chills, rhinorrhea, cough, SOB, nausea, vomiting, diarrhea, or back pain. Pt is on Eliquis and Dr. Meda Coffee is her cardiologist. Pt was recently seen by her cardiologist and family states that it was determined that she had blockages in her legs.   Past Medical History  Diagnosis Date  . MRSA (methicillin resistant staph aureus) culture positive   . Eczema   . Hypercholesteremia   . Hypertension   . Kidney stones   . Anemia   . Vitamin D deficiency   . Paroxysmal atrial fibrillation ([place]) 10/2011, 06/2015  . GERD (gastroesophageal reflux disease)    Past Surgical History  Procedure Laterality Date  . Breast surgery Left     biopsy-neg  . Kidney stone surgery     Family History  Problem Relation Age of Onset  . Hypertension Mother   . Hepatitis C Mother   . Heart disease Father   .  Dementia Father    Social History  Substance Use Topics  . Smoking status: Never Smoker   . Smokeless tobacco: Never Used  . Alcohol Use: No   OB History    No data available     Review of Systems  Constitutional: Negative for fever and chills.  HENT: Negative for congestion and facial swelling.   Respiratory: Negative for shortness of breath.   Cardiovascular: Positive for chest pain and palpitations. Negative for leg swelling.  Gastrointestinal: Positive for abdominal pain. Negative for nausea, vomiting, diarrhea and constipation.  Genitourinary: Negative for dysuria, frequency and difficulty urinating.  Musculoskeletal: Negative for myalgias, back pain, neck pain and neck stiffness.  Skin: Negative for rash and wound.  Neurological: Negative for dizziness, weakness, light-headedness, numbness and headaches.  All other systems reviewed and are negative.     Allergies  Penicillins  Home Medications   Prior to Admission medications   Medication Sig Start Date End Date Taking? Authorizing Provider  apixaban (ELIQUIS) 5 MG TABS tablet Take 1 tablet (5 mg total) by mouth 2 (two) times daily. 07/10/15  Yes Bhavinkumar Bhagat, PA  ascorbic acid (VITAMIN C) 1000 MG tablet Take 1,000 mg by mouth daily.   Yes Historical Provider, MD  atenolol (TENORMIN) 100 MG tablet Take 100 mg by mouth every morning.    Yes Historical Provider, MD  atorvastatin (LIPITOR) 80 MG tablet Take 26.67 mg by mouth  as directed. Take 1/3 tablet by mouth three times a week on Tuesday, thursday, and saturday   Yes Historical Provider, MD  Cholecalciferol (VITAMIN D PO) Take 4,000 Units by mouth daily.    Yes Historical Provider, MD  diltiazem (CARDIZEM CD) 360 MG 24 hr capsule Take 1 capsule (360 mg total) by mouth daily. 07/10/15  Yes Bhavinkumar Bhagat, PA  doxepin (SINEQUAN) 25 MG capsule Take 1 capsule (25 mg total) by mouth at bedtime. 05/08/15  Yes Courtney Forcucci, PA-C  Iron Combinations (IRON COMPLEX  PO) Take 1 tablet by mouth daily.   Yes Historical Provider, MD  PAZEO 0.7 % SOLN Place 1 drop into both eyes daily. 07/04/15  Yes Historical Provider, MD  potassium chloride SA (K-DUR,KLOR-CON) 20 MEQ tablet Take 1 tablet (20 mEq total) by mouth 2 (two) times daily. 02/13/15  Yes Dorothy Spark, MD  triamcinolone cream (KENALOG) 0.1 % Apply 1 application topically as directed. 07/25/15  Yes Unk Pinto, MD  triamterene-hydrochlorothiazide (MAXZIDE-25) 37.5-25 MG per tablet Take 1 tablet by mouth daily. 02/13/15  Yes Dorothy Spark, MD  cephALEXin (KEFLEX) 500 MG capsule Take 1 capsule (500 mg total) by mouth 4 (four) times daily. 08/03/15   Pattricia Boss, MD  digoxin (LANOXIN) 0.125 MG tablet Take 1 tablet (125 mcg total) by mouth daily. 08/04/15 08/03/16  Courtney Forcucci, PA-C  traMADol (ULTRAM) 50 MG tablet Take 1 tablet (50 mg total) by mouth every 6 (six) hours as needed. 08/03/15   Julianne Rice, MD   BP 122/52 mmHg  Pulse 59  Temp(Src) 97.6 F (36.4 C) (Oral)  Resp 14  Ht 5\' 1"  (1.549 m)  Wt 186 lb (84.369 kg)  BMI 35.16 kg/m2  SpO2 96% Physical Exam  Constitutional: She is oriented to person, place, and time. She appears well-developed and well-nourished. No distress.  HENT:  Head: Normocephalic and atraumatic.  Mouth/Throat: Oropharynx is clear and moist.  Eyes: EOM are normal. Pupils are equal, round, and reactive to light.  Neck: Normal range of motion. Neck supple.  Cardiovascular: Normal rate and regular rhythm.  Exam reveals no gallop and no friction rub.   No murmur heard. Pulmonary/Chest: Effort normal and breath sounds normal. No respiratory distress. She has no wheezes. She has no rales. She exhibits no tenderness.  Abdominal: Soft. Bowel sounds are normal. She exhibits no distension and no mass. There is no tenderness. There is no rebound and no guarding.  Benign abdominal exam but no focal tenderness.  Musculoskeletal: Normal range of motion. She exhibits no  edema or tenderness.  No lower extremity swelling or pain. Distal pulses intact.  Neurological: She is alert and oriented to person, place, and time.  5/5 motor in all extremities. Subjective decrease in sensation in bilateral lower extremities  Skin: Skin is warm and dry. No rash noted. No erythema.  Psychiatric: She has a normal mood and affect. Her behavior is normal.  Nursing note and vitals reviewed.   ED Course  Procedures (including critical care time) DIAGNOSTIC STUDIES: Oxygen Saturation is 100% on RA, normal by my interpretation.    COORDINATION OF CARE: 4:09 AM-Discussed treatment plan which includes labs with pt at bedside and pt agreed to plan.   Labs Review Labs Reviewed  URINALYSIS, ROUTINE W REFLEX MICROSCOPIC (NOT AT [facility]) - Abnormal; Notable for the following:    APPearance CLOUDY (*)    Hgb urine dipstick LARGE (*)    Ketones, ur 15 (*)    Protein, ur 30 (*)  Leukocytes, UA SMALL (*)    All other components within normal limits  URINE MICROSCOPIC-ADD ON - Abnormal; Notable for the following:    Bacteria, UA FEW (*)    All other components within normal limits  URINE CULTURE  BASIC METABOLIC PANEL  CBC  HEPATIC FUNCTION PANEL  LIPASE, BLOOD  I-STAT TROPOININ, ED  I-STAT CG4 LACTIC ACID, ED  I-STAT TROPOININ, ED    Imaging Review No results found. I have personally reviewed and evaluated these images and lab results as part of my medical decision-making.   EKG Interpretation   Date/Time:  Sunday August 03 2015 03:41:43 EDT Ventricular Rate:  63 PR Interval:  180 QRS Duration: 80 QT Interval:  394 QTC Calculation: 403 R Axis:   55 Text Interpretation:  Sinus rhythm Abnormal R-wave progression, early  transition Borderline T wave abnormalities Confirmed by Lita Mains  MD,  Rumaysa Sabatino ([id number]) on 08/03/2015 5:19:55 AM      MDM   Final diagnoses:  Paroxysmal atrial fibrillation ([place])  Abdominal angina ([place])  Atypical chest pain  Hematuria     I, Baya Lentz, personally performed the services described in this documentation. All medical record entries made by the scribe were at my direction and in my presence.  I have reviewed the chart and discharge instructions and agree that the record reflects my personal performance and is accurate and complete. [place], [place].  08/06/2015. 6:36 AM.    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 She's been referred to a vascular surgeon. States her abdominal pain is improved. She continues to have no chest pain. Initial troponin is normal. EKG is normal sinus rhythm. No evidence of ischemia. Will need repeat troponin. Anticipate discharge home if normal.  Lactic acid normal. Doubt bowel ischemia present. Patient is comfortable-appearing. Signed out to oncoming emergency physician pending repeat troponin.  Julianne Rice, MD 08/06/15 [phone]

## 2015-08-03 NOTE — ED Notes (Signed)
Patient here with multiple complaints. States onset of chest pain at 2 am which woke her from sleep. Also states bilateral feet numbness and LLQ abdominal pain.

## 2015-08-03 NOTE — ED Notes (Signed)
Patient given 2 cups of ice water and encouraged to void.

## 2015-08-03 NOTE — Discharge Instructions (Signed)
Atrial Fibrillation °Atrial fibrillation is a type of irregular or rapid heartbeat (arrhythmia). In atrial fibrillation, the heart quivers continuously in a chaotic pattern. This occurs when parts of the heart receive disorganized signals that make the heart unable to pump blood normally. This can increase the risk for stroke, heart failure, and other heart-related conditions. There are different types of atrial fibrillation, including: °· Paroxysmal atrial fibrillation. This type starts suddenly, and it usually stops on its own shortly after it starts. °· Persistent atrial fibrillation. This type often lasts longer than a week. It may stop on its own or with treatment. °· Long-lasting persistent atrial fibrillation. This type lasts longer than 12 months. °· Permanent atrial fibrillation. This type does not go away. °Talk with your health care provider to learn about the type of atrial fibrillation that you have. °CAUSES °This condition is caused by some heart-related conditions or procedures, including: °· A heart attack. °· Coronary artery disease. °· Heart failure. °· Heart valve conditions. °· High blood pressure. °· Inflammation of the sac that surrounds the heart (pericarditis). °· Heart surgery. °· Certain heart rhythm disorders, such as Wolf-Parkinson-White syndrome. °Other causes include: °· Pneumonia. °· Obstructive sleep apnea. °· Blockage of an artery in the lungs (pulmonary embolism, or PE). °· Lung cancer. °· Chronic lung disease. °· Thyroid problems, especially if the thyroid is overactive (hyperthyroidism). °· Caffeine. °· Excessive alcohol use or illegal drug use. °· Use of some medicines, including certain decongestants and diet pills. °Sometimes, the cause cannot be found. °RISK FACTORS °This condition is more likely to develop in: °· People who are older in age. °· People who smoke. °· People who have diabetes mellitus. °· People who are overweight (obese). °· Athletes who exercise  vigorously. °SYMPTOMS °Symptoms of this condition include: °· A feeling that your heart is beating rapidly or irregularly. °· A feeling of discomfort or pain in your chest. °· Shortness of breath. °· Sudden light-headedness or weakness. °· Getting tired easily during exercise. °In some cases, there are no symptoms. °DIAGNOSIS °Your health care provider may be able to detect atrial fibrillation when taking your pulse. If detected, this condition may be diagnosed with: °· An electrocardiogram (ECG). °· A Holter monitor test that records your heartbeat patterns over a 24-hour period. °· Transthoracic echocardiogram (TTE) to evaluate how blood flows through your heart. °· Transesophageal echocardiogram (TEE) to view more detailed images of your heart. °· A stress test. °· Imaging tests, such as a CT scan or chest X-Ralpheal Zappone. °· Blood tests. °TREATMENT °The main goals of treatment are to prevent blood clots from forming and to keep your heart beating at a normal rate and rhythm. The type of treatment that you receive depends on many factors, such as your underlying medical conditions and how you feel when you are experiencing atrial fibrillation. °This condition may be treated with: °· Medicine to slow down the heart rate, bring the heart's rhythm back to normal, or prevent clots from forming. °· Electrical cardioversion. This is a procedure that resets your heart's rhythm by delivering a controlled, low-energy shock to the heart through your skin. °· Different types of ablation, such as catheter ablation, catheter ablation with pacemaker, or surgical ablation. These procedures destroy the heart tissues that send abnormal signals. When the pacemaker is used, it is placed under your skin to help your heart beat in a regular rhythm. °HOME CARE INSTRUCTIONS °· Take over-the counter and prescription medicines only as told by your health care provider. °·   If your health care provider prescribed a blood-thinning medicine  (anticoagulant), take it exactly as told. Taking too much blood-thinning medicine can cause bleeding. If you do not take enough blood-thinning medicine, you will not have the protection that you need against stroke and other problems.  Do not use tobacco products, including cigarettes, chewing tobacco, and e-cigarettes. If you need help quitting, ask your health care provider.  If you have obstructive sleep apnea, manage your condition as told by your health care provider.  Do not drink alcohol.  Do not drink beverages that contain caffeine, such as coffee, soda, and tea.  Maintain a healthy weight. Do not use diet pills unless your health care provider approves. Diet pills may make heart problems worse.  Follow diet instructions as told by your health care provider.  Exercise regularly as told by your health care provider.  Keep all follow-up visits as told by your health care provider. This is important. PREVENTION  Avoid drinking beverages that contain caffeine or alcohol.  Avoid certain medicines, especially medicines that are used for breathing problems.  Avoid certain herbs and herbal medicines, such as those that contain ephedra or ginseng.  Do not use illegal drugs, such as cocaine and amphetamines.  Do not smoke.  Manage your high blood pressure. SEEK MEDICAL CARE IF:  You notice a change in the rate, rhythm, or strength of your heartbeat.  You are taking an anticoagulant and you notice increased bruising.  You tire more easily when you exercise or exert yourself. SEEK IMMEDIATE MEDICAL CARE IF:  You have chest pain, abdominal pain, sweating, or weakness.  You feel nauseous.  You notice blood in your vomit, bowel movement, or urine.  You have shortness of breath.  You suddenly have swollen feet and ankles.  You feel dizzy.  You have sudden weakness or numbness of the face, arm, or leg, especially on one side of the body.  You have trouble speaking,  trouble understanding, or both (aphasia).  Your face or your eyelid droops on one side. These symptoms may represent a serious problem that is an emergency. Do not wait to see if the symptoms will go away. Get medical help right away. Call your local emergency services (911 in the [place] Do not drive yourself to the hospital.   This information is not intended to replace advice given to you by your health care provider. Make sure you discuss any questions you have with your health care provider.   Document Released: 10/04/2005 Document Revised: 06/25/2015 Document Reviewed: 01/29/2015 Elsevier Interactive Patient Education 2016 Else[employer]  Nonspecific Chest Pain  Chest pain can be caused by many different conditions. There is always a chance that your pain could be related to something serious, such as a heart attack or a blood clot in your lungs. Chest pain can also be caused by conditions that are not life-threatening. If you have chest pain, it is very important to follow up with your health care provider. CAUSES  Chest pain can be caused by:  Heartburn.  Pneumonia or bronchitis.  Anxiety or stress.  Inflammation around your heart (pericarditis) or lung (pleuritis or pleurisy).  A blood clot in your lung.  A collapsed lung (pneumothorax). It can develop suddenly on its own (spontaneous pneumothorax) or from trauma to the chest.  Shingles infection (varicella-zoster virus).  Heart attack.  Damage to the bones, muscles, and cartilage that make up your chest wall. This can include:  Bruised bones due to injury.  Strained  muscles or cartilage due to frequent or repeated coughing or overwork.  Fracture to one or more ribs.  Sore cartilage due to inflammation (costochondritis). RISK FACTORS  Risk factors for chest pain may include:  Activities that increase your risk for trauma or injury to your chest.  Respiratory infections or conditions that cause frequent  coughing.  Medical conditions or overeating that can cause heartburn.  Heart disease or family history of heart disease.  Conditions or health behaviors that increase your risk of developing a blood clot.  Having had chicken pox (varicella zoster). SIGNS AND SYMPTOMS Chest pain can feel like:  Burning or tingling on the surface of your chest or deep in your chest.  Crushing, pressure, aching, or squeezing pain.  Dull or sharp pain that is worse when you move, cough, or take a deep breath.  Pain that is also felt in your back, neck, shoulder, or arm, or pain that spreads to any of these areas. Your chest pain may come and go, or it may stay constant. DIAGNOSIS Lab tests or other studies may be needed to find the cause of your pain. Your health care provider may have you take a test called an ambulatory ECG (electrocardiogram). An ECG records your heartbeat patterns at the time the test is performed. You may also have other tests, such as:  Transthoracic echocardiogram (TTE). During echocardiography, sound waves are used to create a picture of all of the heart structures and to look at how blood flows through your heart.  Transesophageal echocardiogram (TEE).This is a more advanced imaging test that obtains images from inside your body. It allows your health care provider to see your heart in finer detail.  Cardiac monitoring. This allows your health care provider to monitor your heart rate and rhythm in real time.  Holter monitor. This is a portable device that records your heartbeat and can help to diagnose abnormal heartbeats. It allows your health care provider to track your heart activity for several days, if needed.  Stress tests. These can be done through exercise or by taking medicine that makes your heart beat more quickly.  Blood tests.  Imaging tests. TREATMENT  Your treatment depends on what is causing your chest pain. Treatment may include:  Medicines. These may  include:  Acid blockers for heartburn.  Anti-inflammatory medicine.  Pain medicine for inflammatory conditions.  Antibiotic medicine, if an infection is present.  Medicines to dissolve blood clots.  Medicines to treat coronary artery disease.  Supportive care for conditions that do not require medicines. This may include:  Resting.  Applying heat or cold packs to injured areas.  Limiting activities until pain decreases. HOME CARE INSTRUCTIONS  If you were prescribed an antibiotic medicine, finish it all even if you start to feel better.  Avoid any activities that bring on chest pain.  Do not use any tobacco products, including cigarettes, chewing tobacco, or electronic cigarettes. If you need help quitting, ask your health care provider.  Do not drink alcohol.  Take medicines only as directed by your health care provider.  Keep all follow-up visits as directed by your health care provider. This is important. This includes any further testing if your chest pain does not go away.  If heartburn is the cause for your chest pain, you may be told to keep your head raised (elevated) while sleeping. This reduces the chance that acid will go from your stomach into your esophagus.  Make lifestyle changes as directed by your health care  provider. These may include:  Getting regular exercise. Ask your health care provider to suggest some activities that are safe for you.  Eating a heart-healthy diet. A registered dietitian can help you to learn healthy eating options.  Maintaining a healthy weight.  Managing diabetes, if necessary.  Reducing stress. SEEK MEDICAL CARE IF:  Your chest pain does not go away after treatment.  You have a rash with blisters on your chest.  You have a fever. SEEK IMMEDIATE MEDICAL CARE IF:   Your chest pain is worse.  You have an increasing cough, or you cough up blood.  You have severe abdominal pain.  You have severe weakness.  You  faint.  You have chills.  You have sudden, unexplained chest discomfort.  You have sudden, unexplained discomfort in your arms, back, neck, or jaw.  You have shortness of breath at any time.  You suddenly start to sweat, or your skin gets clammy.  You feel nauseous or you vomit.  You suddenly feel light-headed or dizzy.  Your heart begins to beat quickly, or it feels like it is skipping beats. These symptoms may represent a serious problem that is an emergency. Do not wait to see if the symptoms will go away. Get medical help right away. Call your local emergency services (911 in the [place] Do not drive yourself to the hospital.   This information is not intended to replace advice given to you by your health care provider. Make sure you discuss any questions you have with your health care provider.   Document Released: 07/14/2005 Document Revised: 10/25/2014 Document Reviewed: 05/10/2014 Elsevier Interactive Patient Education 2016 Elsevier Inc. Hematuria, Adult Hematuria is blood in your urine. It can be caused by a bladder infection, kidney infection, prostate infection, kidney stone, or cancer of your urinary tract. Infections can usually be treated with medicine, and a kidney stone usually will pass through your urine. If neither of these is the cause of your hematuria, further workup to find out the reason may be needed. It is very important that you tell your health care provider about any blood you see in your urine, even if the blood stops without treatment or happens without causing pain. Blood in your urine that happens and then stops and then happens again can be a symptom of a very serious condition. Also, pain is not a symptom in the initial stages of many urinary cancers. HOME CARE INSTRUCTIONS   Drink lots of fluid, 3-4 quarts a day. If you have been diagnosed with an infection, cranberry juice is especially recommended, in addition to large amounts of water.  Avoid  caffeine, tea, and carbonated beverages because they tend to irritate the bladder.  Avoid alcohol because it may irritate the prostate.  Take all medicines as directed by your health care provider.  If you were prescribed an antibiotic medicine, finish it all even if you start to feel better.  If you have been diagnosed with a kidney stone, follow your health care provider's instructions regarding straining your urine to catch the stone.  Empty your bladder often. Avoid holding urine for long periods of time.  After a bowel movement, women should cleanse front to back. Use each tissue only once.  Empty your bladder before and after sexual intercourse if you are a female. SEEK MEDICAL CARE IF:  You develop back pain.  You have a fever.  You have a feeling of sickness in your stomach (nausea) or vomiting.  Your symptoms are not  better in 3 days. Return sooner if you are getting worse. SEEK IMMEDIATE MEDICAL CARE IF:   You develop severe vomiting and are unable to keep the medicine down.  You develop severe back or abdominal pain despite taking your medicines.  You begin passing a large amount of blood or clots in your urine.  You feel extremely weak or faint, or you pass out. MAKE SURE YOU:   Understand these instructions.  Will watch your condition.  Will get help right away if you are not doing well or get worse.   This information is not intended to replace advice given to you by your health care provider. Make sure you discuss any questions you have with your health care provider.   Document Released: 10/04/2005 Document Revised: 10/25/2014 Document Reviewed: 06/04/2013 Elsevier Interactive Patient Education [employer].

## 2015-08-04 ENCOUNTER — Ambulatory Visit (INDEPENDENT_AMBULATORY_CARE_PROVIDER_SITE_OTHER): Payer: BC Managed Care – PPO | Admitting: Internal Medicine

## 2015-08-04 ENCOUNTER — Encounter: Payer: Self-pay | Admitting: Internal Medicine

## 2015-08-04 VITALS — BP 122/80 | HR 60 | Temp 97.8°F | Resp 16 | Ht 62.0 in | Wt 181.0 lb

## 2015-08-04 DIAGNOSIS — I480 Paroxysmal atrial fibrillation: Secondary | ICD-10-CM

## 2015-08-04 DIAGNOSIS — I10 Essential (primary) hypertension: Secondary | ICD-10-CM

## 2015-08-04 DIAGNOSIS — R319 Hematuria, unspecified: Secondary | ICD-10-CM

## 2015-08-04 DIAGNOSIS — I4891 Unspecified atrial fibrillation: Secondary | ICD-10-CM | POA: Diagnosis not present

## 2015-08-04 LAB — URINE CULTURE: SPECIAL REQUESTS: NORMAL

## 2015-08-04 MED ORDER — DIGOXIN 125 MCG PO TABS: 125.0000 ug | ORAL_TABLET | Freq: Every day | ORAL | Status: DC

## 2015-08-04 NOTE — Patient Instructions (Signed)
Digoxin tablets or capsules What is this medicine? DIGOXIN (di JOX in) is used to treat congestive heart failure and heart rhythm problems. This medicine may be used for other purposes; ask your health care provider or pharmacist if you have questions. What should I tell my health care provider before I take this medicine? They need to know if you have any of these conditions: -certain heart rhythm disorders -heart disease or recent heart attack -kidney or liver disease -an unusual or allergic reaction to digoxin, other medicines, foods, dyes, or preservatives -pregnant or trying to get pregnant -breast-feeding How should I use this medicine? Take this medicine by mouth with a glass of water. Follow the directions on the prescription label. Take your doses at regular intervals. Do not take your medicine more often than directed. Talk to your pediatrician regarding the use of this medicine in children. Special care may be needed. Overdosage: If you think you have taken too much of this medicine contact a poison control center or emergency room at once. NOTE: This medicine is only for you. Do not share this medicine with others. What if I miss a dose? If you miss a dose, take it as soon as you can. If it is almost time for your next dose, take only that dose. Do not take double or extra doses. What may interact with this medicine? -activated charcoal -albuterol -alprazolam -antacids -antiviral medicines for HIV or AIDS like ritonavir and saquinavir -calcium -certain antibiotics like azithromycin, clarithromycin, erythromycin, gentamicin, neomycin, trimethoprim, and tetracycline -certain medicines for blood pressure, heart disease, irregular heart beat -certain medicines for cancer -certain medicines for cholesterol like atorvastatin, cholestyramine, and colestipol -certain medicines for diabetes, like acarbose, exenatide, miglitol, and metformin -certain medicines for fungal infections  like ketoconazole and itraconazole -certain medicines for stomach problems like omeprazole, esomeprazole, lansoprazole, rabeprazole, metoclopramide, and sucralfate -cyclosporine -diphenoxylate -epinephrine -kaolin; pectin -nefazodone -NSAIDS, medicines for pain and inflammation, like celecoxib, ibuprofen, or naproxen -penicillamine -phenytoin -propantheline -quinine -phenytoin -rifampin -succinylcholine -St. John's Wort -sulfasalazine -teriparatide -thyroid hormones -tolvaptan This list may not describe all possible interactions. Give your health care provider a list of all the medicines, herbs, non-prescription drugs, or dietary supplements you use. Also tell them if you smoke, drink alcohol, or use illegal drugs. Some items may interact with your medicine. What should I watch for while using this medicine? Visit your doctor or health care professional for regular checks on your progress. Do not stop taking this medicine without the advice of your doctor or health care professional, even if you feel better. Do not change the brand you are taking, other brands may affect you differently. Check your heart rate and blood pressure regularly while you are taking this medicine. Ask your doctor or health care professional what your heart rate and blood pressure should be, and when you should contact him or her. Your doctor or health care professional also may schedule regular blood tests and electrocardiograms to check your progress. Watch your diet. Less digoxin may be absorbed from the stomach if you have a diet high in bran fiber. Do not treat yourself for coughs, colds or allergies without asking your doctor or health care professional for advice. Some ingredients can increase possible side effects. What side effects may I notice from receiving this medicine? Side effects that you should report to your doctor or health care professional as soon as possible: -allergic reactions like skin  rash, itching or hives, swelling of the face, lips, or tongue -changes in  behavior, mood, or mental ability -changes in vision -confusion -fast, irregular heartbeat -feeling faint or lightheaded, falls -headache -nausea, vomiting -unusual bleeding, bruising -unusually weak or tired Side effects that usually do not require medical attention (report to your doctor or health care professional if they continue or are bothersome): -breast enlargement in men and women -diarrhea This list may not describe all possible side effects. Call your doctor for medical advice about side effects. You may report side effects to FDA at 1-800-FDA-1088. Where should I keep my medicine? Keep out of the reach of children. Store at room temperature between 15 and 30 degrees C (59 and 86 degrees F). Protect from light and moisture. Throw away any unused medicine after the expiration date. NOTE: This sheet is a summary. It may not cover all possible information. If you have questions about this medicine, talk to your doctor, pharmacist, or health care provider.    2016, Elsevier/Gold Standard. (2012-12-14 12:40:27)

## 2015-08-04 NOTE — Progress Notes (Signed)
Subjective:    Patient ID: [name], female    DOB: 05/25/1956, 59 y.o.   MRN: [id number]  HPI  Patient presents to the office for evaluation of post hospitalization of chest pain.  Patient reports that she had chest pain and palpitations that woke her form sleeping.  She also had LLQ abdominal pain and some foot numbness bilaterally.  EKG at ER showed NSR without atrial fibrillation.  She had normal labs with the exception of urine which showed a lot of red blood cells and also multiple types of bacteria.  She reports that she still is having some left lower quadrant pain in her stomach.  She reports that the ultram that she is taking doesn't really help her stomach a whole lot.  She does tend to have some relief but its not completely gone.  She reports that she may have forgotten to take the ultram today.  She is due to see vascular doctor on Wednesday and is also due to see urology on Friday for hematuria.      Review of Systems  Constitutional: Negative for fever, chills and fatigue.  HENT: Negative for congestion, postnasal drip, sore throat and tinnitus.   Respiratory: Negative for chest tightness and shortness of breath.   Cardiovascular: Positive for chest pain and palpitations.  Gastrointestinal: Positive for abdominal pain. Negative for nausea, vomiting, diarrhea, constipation, blood in stool and anal bleeding.       Dark colored stools  Genitourinary: Positive for hematuria. Negative for dysuria, urgency, frequency and difficulty urinating.  Neurological: Negative for dizziness, weakness and light-headedness.       Objective:   Physical Exam  Constitutional: She is oriented to person, place, and time. She appears well-developed and well-nourished. No distress.  HENT:  Head: Normocephalic.  Mouth/Throat: Oropharynx is clear and moist. No oropharyngeal exudate.  Eyes: Conjunctivae are normal. No scleral icterus.  Neck: Normal range of motion. Neck supple. No JVD  present. No thyromegaly present.  Cardiovascular: Normal rate, regular rhythm, normal heart sounds and intact distal pulses.  Exam reveals no gallop and no friction rub.   No murmur heard. Pulmonary/Chest: Effort normal and breath sounds normal. No respiratory distress. She has no wheezes. She has no rales. She exhibits no tenderness.  Abdominal: Soft. Bowel sounds are normal. She exhibits no distension and no mass. There is no tenderness. There is no rebound and no guarding.  Genitourinary:  Stool is hemoccult negative.  Stool balls palpated in the rectum.    Musculoskeletal: Normal range of motion.  Lymphadenopathy:    She has no cervical adenopathy.  Neurological: She is alert and oriented to person, place, and time.  Skin: Skin is warm and dry. Rash noted. She is not diaphoretic.  Severe eczematous rash across the entire body with severe dryness and excoriation.    Psychiatric: She has a normal mood and affect. Her behavior is normal. Judgment and thought content normal.  Nursing note and vitals reviewed.   Filed Vitals:   08/04/15 1517  BP: 122/80  Pulse: 60  Temp: 97.8 F (36.6 C)  Resp: 16          Assessment & Plan:    1. Essential hypertension -cont current meds  2. Paroxysmal atrial fibrillation (HCC) -see below -message sent to Dr. Meda Coffee regarding medication changes.    3. Atrial fibrillation with RVR ([place]) -recheck level in 2 weeks -cont cardizem and also atenolol - digoxin (LANOXIN) 0.125 MG tablet; Take 1 tablet (125 mcg  total) by mouth daily.  Dispense: 30 tablet; Refill: 11  4. Hematuria -going to urology Friday - Urinalysis, Routine w reflex microscopic (not at [facility]) - Culture, Urine

## 2015-08-05 ENCOUNTER — Encounter: Payer: Self-pay | Admitting: Vascular Surgery

## 2015-08-05 LAB — URINALYSIS, ROUTINE W REFLEX MICROSCOPIC
BILIRUBIN URINE: NEGATIVE
GLUCOSE, UA: NEGATIVE
KETONES UR: NEGATIVE
Nitrite: NEGATIVE
Specific Gravity, Urine: 1.014 (ref 1.001–1.035)
pH: 7 (ref 5.0–8.0)

## 2015-08-05 LAB — URINALYSIS, MICROSCOPIC ONLY
Bacteria, UA: NONE SEEN [HPF]
CASTS: NONE SEEN [LPF]
CRYSTALS: NONE SEEN [HPF]
YEAST: NONE SEEN [HPF]

## 2015-08-05 LAB — URINE CULTURE
Colony Count: NO GROWTH
Organism ID, Bacteria: NO GROWTH

## 2015-08-06 ENCOUNTER — Encounter: Payer: Self-pay | Admitting: Vascular Surgery

## 2015-08-06 ENCOUNTER — Ambulatory Visit (INDEPENDENT_AMBULATORY_CARE_PROVIDER_SITE_OTHER): Payer: BC Managed Care – PPO | Admitting: Vascular Surgery

## 2015-08-06 VITALS — BP 125/81 | HR 61 | Temp 98.5°F | Resp 16 | Ht 61.0 in | Wt 180.0 lb

## 2015-08-06 DIAGNOSIS — R1030 Lower abdominal pain, unspecified: Secondary | ICD-10-CM

## 2015-08-06 NOTE — Progress Notes (Signed)
[facility] [place] HISTORY AND PHYSICAL   History of Present Illness:  Patient is a 59 y.o. year old female who presents for evaluation of chronic abdominal pain. The patient has been having abdominal pain for approximately one month. This occurs usually after eating a meal. She also states that this is worse with eating greasy foods. She has lost 12-13 pounds over the last month. She was having some nausea and vomiting initially but no longer has this symptom. She denies any diarrhea or constipation. She denies any prior history of peptic ulcer disease. She states the pain is improved about 30 minutes after taking Ultram. She does not really describe food fear. She describes the abdominal pain as in the lower part of her pelvis. She denies any epigastric pain. She denies any radiation of the pain to her back or shoulders. She has a history of atrial fibrillation and is on Eliquis for this. She denies any prior abdominal operations. Other medical problems include elevated cholesterol, hypertension anemia and reflux. Are all currently stable.  Past Medical History  Diagnosis Date  . MRSA (methicillin resistant staph aureus) culture positive   . Eczema   . Hypercholesteremia   . Hypertension   . Kidney stones   . Anemia   . Vitamin D deficiency   . Paroxysmal atrial fibrillation ([place]) 10/2011, 06/2015  . GERD (gastroesophageal reflux disease)   . Atrial fibrillation ([place]) 2016    Past Surgical History  Procedure Laterality Date  . Breast surgery Left     biopsy-neg  . Kidney stone surgery      Social History Social History  Substance Use Topics  . Smoking status: Never Smoker   . Smokeless tobacco: Never Used  . Alcohol Use: No    Family History Family History  Problem Relation Age of Onset  . Hypertension Mother   . Hepatitis C Mother   . Heart disease Father   . Dementia Father   . Aneurysm Brother     Allergies  Allergies  Allergen Reactions  .  Penicillins Other (See Comments)    PT. DOESN'T REMEMBER     Current Outpatient Prescriptions  Medication Sig Dispense Refill  . apixaban (ELIQUIS) 5 MG TABS tablet Take 1 tablet (5 mg total) by mouth 2 (two) times daily. 60 tablet 11  . ascorbic acid (VITAMIN C) 1000 MG tablet Take 1,000 mg by mouth daily.    Marland Kitchen atenolol (TENORMIN) 100 MG tablet Take 100 mg by mouth every morning.     Marland Kitchen atorvastatin (LIPITOR) 80 MG tablet Take 26.67 mg by mouth as directed. Take 1/3 tablet by mouth three times a week on Tuesday, thursday, and saturday    . cephALEXin (KEFLEX) 500 MG capsule Take 1 capsule (500 mg total) by mouth 4 (four) times daily. 20 capsule 0  . Cholecalciferol (VITAMIN D PO) Take 4,000 Units by mouth daily.     Marland Kitchen diltiazem (CARDIZEM CD) 360 MG 24 hr capsule Take 1 capsule (360 mg total) by mouth daily. 30 capsule 11  . doxepin (SINEQUAN) 25 MG capsule Take 1 capsule (25 mg total) by mouth at bedtime. 30 capsule 5  . Iron Combinations (IRON COMPLEX PO) Take 1 tablet by mouth daily.    Marland Kitchen PAZEO 0.7 % SOLN Place 1 drop into both eyes daily.    . potassium chloride SA (K-DUR,KLOR-CON) 20 MEQ tablet Take 1 tablet (20 mEq total) by mouth 2 (two) times daily. 180 tablet 6  . traMADol (ULTRAM)  50 MG tablet Take 1 tablet (50 mg total) by mouth every 6 (six) hours as needed. 15 tablet 0  . triamcinolone cream (KENALOG) 0.1 % Apply 1 application topically as directed. 30 g 1  . triamterene-hydrochlorothiazide (MAXZIDE-25) 37.5-25 MG per tablet Take 1 tablet by mouth daily. 30 tablet 6  . digoxin (LANOXIN) 0.125 MG tablet Take 1 tablet (125 mcg total) by mouth daily. (Patient not taking: Reported on 08/06/2015) 30 tablet 11   No current facility-administered medications for this visit.    ROS:   General:  No weight loss, Fever, chills  HEENT: No recent headaches, no nasal bleeding, no visual changes, no sore throat  Neurologic: No dizziness, blackouts, seizures. No recent symptoms of stroke  or mini- stroke. No recent episodes of slurred speech, or temporary blindness.  Cardiac: No recent episodes of chest pain/pressure, no shortness of breath at rest.  No shortness of breath with exertion.  Denies history of atrial fibrillation or irregular heartbeat  Vascular: No history of rest pain in feet.  No history of claudication.  No history of non-healing ulcer, No history of DVT   Pulmonary: No home oxygen, no productive cough, no hemoptysis,  No asthma or wheezing  Musculoskeletal:  [ ]  Arthritis, [ ]  Low back pain,  [ ]  Joint pain  Hematologic:No history of hypercoagulable state.  No history of easy bleeding.  No history of anemia  Gastrointestinal: No hematochezia or melena,  No gastroesophageal reflux, no trouble swallowing  Urinary: [ ]  chronic Kidney disease, [ ]  on HD - [ ]  MWF or [ ]  TTHS, [ ]  Burning with urination, [ ]  Frequent urination, [ ]  Difficulty urinating;   Skin: No rashes  Psychological: No history of anxiety,  No history of depression   Physical Examination  Filed Vitals:   08/06/15 1033  BP: 125/81  Pulse: 61  Temp: 98.5 F (36.9 C)  TempSrc: Oral  Resp: 16  Height: 5\' 1"  (1.549 m)  Weight: 180 lb (81.647 kg)  SpO2: 98%    Body mass index is 34.03 kg/(m^2).  General:  Alert and oriented, no acute distress HEENT: Normal Neck: No bruit or JVD Pulmonary: Clear to auscultation bilaterally Cardiac: Regular Rate and Rhythm without murmur Abdomen: Soft, mild lower abdominal tenderness, non-distended, no mass, no scars Skin: No rash Extremity Pulses:  2+ radial, brachial, femoral, dorsalis pedis, posterior tibial pulses bilaterally Musculoskeletal: No deformity or edema  Neurologic: Upper and lower extremity motor 5/5 and symmetric  DATA:  Recent CT scan of abdomen and pelvis is reviewed. This shows a flap-like stenosis in the proximal celiac which is short in length with some poststenotic dilatation. The superior mesenteric and inferior  mesenteric arteries are widely patent. There is really no significant atherosclerotic occlusive disease throughout her abdominal vascular tree.   ASSESSMENT: Patient with chronic abdominal pain. She has an isolated celiac artery stenosis. Since her superior mesenteric artery and inferior mesenteric arteries are widely patent. It is unlikely that an isolated celiac artery stenosis could be causing her symptoms. 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 I believe the best option would be to exhaust other possibilities of her abdominal pain prior to considering this as her primary diagnosis.   PLAN:  Refer to gastroenterology for further evaluation of intra-abdominal sources of her abdominal pain. The patient will follow-up with me after her GI appointment.  Ruta Hinds, MD [facility] [place] Office: [phone] Pager: [phone]

## 2015-08-07 ENCOUNTER — Encounter: Payer: Self-pay | Admitting: Gastroenterology

## 2015-08-08 ENCOUNTER — Ambulatory Visit (INDEPENDENT_AMBULATORY_CARE_PROVIDER_SITE_OTHER): Payer: BC Managed Care – PPO | Admitting: Cardiology

## 2015-08-08 ENCOUNTER — Encounter: Payer: Self-pay | Admitting: Cardiology

## 2015-08-08 ENCOUNTER — Encounter: Payer: Self-pay | Admitting: *Deleted

## 2015-08-08 VITALS — BP 126/72 | HR 52 | Ht 61.0 in | Wt 180.0 lb

## 2015-08-08 DIAGNOSIS — I10 Essential (primary) hypertension: Secondary | ICD-10-CM | POA: Diagnosis not present

## 2015-08-08 DIAGNOSIS — N3001 Acute cystitis with hematuria: Secondary | ICD-10-CM | POA: Diagnosis not present

## 2015-08-08 DIAGNOSIS — I480 Paroxysmal atrial fibrillation: Secondary | ICD-10-CM

## 2015-08-08 DIAGNOSIS — Z7901 Long term (current) use of anticoagulants: Secondary | ICD-10-CM | POA: Diagnosis not present

## 2015-08-08 MED ORDER — RIVAROXABAN 20 MG PO TABS: 20.0000 mg | ORAL_TABLET | Freq: Every day | ORAL | Status: DC

## 2015-08-08 NOTE — Patient Instructions (Signed)
Your physician has recommended you make the following change in your medication:  1.) STOP ELIQUIS 2.) STOP DIGOXIN 3.) START XARELTO 20 MG ONCE DAILY WITH SUPPER.  Your physician recommends that you schedule a follow-up appointment in: 1 MONTH WITH NP/PA

## 2015-08-08 NOTE — Progress Notes (Signed)
Cardiology Office Note  Date:  08/08/2015   ID:  [name], DOB Jan 06, 1956, MRN [id number]  PCP:  Alesia Richards, MD  Cardiologist: Dr. Meda Coffee   CC: Palpitations, UTI, hematuria  History of Present Illness:  [name] is a 59 y.o. female with past medical history of HTN, HLD, and Paroxysmal Atrial fibrillation (diagnosed 10/2011) who presents for post hospital follow up after a recent admission for atrial fib with RVR.    The patient was last seen in office by Dr. Meda Coffee in 01/2015 and was doing well at that time. Patient had a negative nuclear study in December 2015 with EF of 61% and normal wall motion. Did have hypertensive response on exam so Cardizem CD was increased from 120mg  to 180mg  PO daily. Has only been on ASA 81mg  for anticoagulation due to only having one known episode of A-fib in the past in 2013.   She was admitted to [facility] 07/08/15-07/10/15 for afib w/ RVR. She presented with n/v, feeling shakey and "aching" chest pain. This was typical sx of her afib. Her initial EKG showed atrial fibrillation w/ RVR's in 170s. She was treated with diltiazem which was later converted to the long acting form at 360 mg. She spontaneously converted back into NSR. She was placed on eliquis. 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 Outpatient vascular surgery referral was recommended.  08/08/2015 - she has been to the ER again on Sunday (two ER visits in the last month) for a-fib with RVR, she was diagnosed with with hematuria and UTI and was started on ATB. She feels that her eczema worsened with Eliquis. She was started on Digoxin by her PCP.  No CP, SOB, orthopnea, PND, dizziness or LE edema.    Studies:  - Echo (07/10/15): EF 60-65%, normal wall motion.  - Ett  Nuclear (10/08/14):  Normal nuclear study. EF 31%, hypertensive response to exercise  Recent Labs/Images:  Recent Labs  07/29/15 1657 08/03/15 0356  NA 141 140  K 4.2 3.7  BUN 14 14  CREATININE 0.71 0.91  ALT 11 15  HGB 13.8 13.9  TSH 0.675  --   LDLCALC 123  --   HDL 57  --      Dg Chest 2 View  07/08/2015   CLINICAL DATA:  Irregular and increased heart rate.  EXAM: CHEST  2 VIEW  COMPARISON:  August 12, 2014, October 27, 2011  FINDINGS: The heart size and mediastinal contours are stable. Right hilum is unchanged. Both lungs are clear. The visualized skeletal structures are unremarkable.  IMPRESSION: No active cardiopulmonary disease.   Electronically Signed   By: Abelardo Diesel M.D.   On: 07/08/2015 12:47   Ct Angio Abd/pel W/ And/or W/o  07/08/2015   CLINICAL DATA:  Acute abdominal pain with nausea and vomiting, concern for mesenteric ischemia.  EXAM: CT ANGIOGRAPHY ABDOMEN AND PELVIS  TECHNIQUE: Multidetector CT imaging of the abdomen and pelvis was performed using the standard protocol during bolus administration of intravenous contrast. Multiplanar reconstructed images including MIPs were obtained and reviewed to evaluate the vascular anatomy.  CONTRAST:  100 cc Omnipaque 350  COMPARISON:  05/23/2015  FINDINGS: Arterial findings:  Aorta: Mild tortuosity and atherosclerotic change of the abdominal aorta without aneurysm, occlusive process, or dissection.  Celiac axis: Celiac  origin demonstrates a moderate to severe stenosis estimated at 60-80%. This appears to be related to extrinsic compression from the overlying diaphragmatic crus indicative of median arcuate ligament syndrome. Despite this, the celiac axis and its branches remain patent. This is doubtful to be symptomatic because the superior and inferior mesenteric vessels remain patent.  Superior mesenteric: Widely patent origin. Main SMA trunk is widely patent throughout the mesenteric. Jejunal and colic branches are patent.  Left  renal:           Widely patent.  Right renal:          Minor origin calcification, otherwise patent.  Inferior mesenteric:  Minor origin calcification but remains patent.  Left iliac: Minor iliac atherosclerosis. The left common, internal and external iliac arteries remain patent.  Right iliac: Symmetric minor atherosclerosis. The right common, internal and external iliac arteries remain patent.  The common femoral, proximal profunda femoral, and superficial femoral arteries demonstrated are all patent.  Venous findings: IVC, hepatic veins, portal, splenic, mesenteric, and renal veins are all patent. Pelvic iliac veins are patent. Visualized femoral veins are also patent. No veno-occlusive process.  Review of the MIP images confirms the above findings.  Nonvascular findings: Stable hypodense left hepatic cyst measuring 14 mm, image 15. No other hepatic abnormality or biliary dilatation. Gallbladder, biliary system, pancreas, spleen, and adrenal glands are within normal limits for age and demonstrate no acute process. Stable small hiatal hernia. Clear lung bases. No pericardial or pleural effusion. Minor basilar atelectasis.  Kidneys demonstrate stable intrarenal calculi bilaterally without obstruction or hydronephrosis. There is cortical scarring in the left kidney lower pole.  Negative for bowel obstruction, dilatation, ileus, or free air. No evidence of bowel wall thickening or pneumatosis. No CT signs to suggest bowel ischemia. Terminal ileum unremarkable. Minor scattered colonic diverticulosis.  Stable benign pelvic calcifications. Uterus and adnexal normal in size. Urinary bladder unremarkable. No acute distal bowel process. Sigmoid diverticulosis evident. No inguinal abnormality or hernia. No pelvic fluid collection, hemorrhage, abscess or adenopathy.  Gluteal injection granulomas noted bilaterally.  Lower lumbar facet arthropathy evident diffusely.  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 See above comment.  SMA and IMA remain patent.  No CT signs of acute bowel ischemia, obstruction, ileus or free air  No significant aortoiliac occlusive process  Bilateral nephrolithiasis  Small hiatal hernia  Stable hepatic cyst  Diverticulosis   Electronically Signed   By: Jerilynn Mages.  Shick M.D.   On: 07/08/2015 17:12     Wt Readings from Last 3 Encounters:  08/08/15 180 lb (81.647 kg)  08/06/15 180 lb (81.647 kg)  08/04/15 181 lb (82.101 kg)     Past Medical History  Diagnosis Date  . MRSA (methicillin resistant staph aureus) culture positive   . Eczema   . Hypercholesteremia   . Hypertension   . Kidney stones   . Anemia   . Vitamin D deficiency   . Paroxysmal atrial fibrillation ([place]) 10/2011, 06/2015  . GERD (gastroesophageal reflux disease)   . Atrial fibrillation ([place]) 2016    Current Outpatient Prescriptions  Medication Sig Dispense Refill  . apixaban (ELIQUIS) 5 MG TABS tablet Take 1 tablet (5 mg total) by mouth 2 (two) times daily. 60 tablet 11  . ascorbic acid (VITAMIN C) 1000 MG tablet Take 1,000 mg by mouth daily.    Marland Kitchen atenolol (TENORMIN) 100 MG tablet Take 100 mg by  mouth every morning.     Marland Kitchen atorvastatin (LIPITOR) 80 MG tablet Take 26.67 mg by mouth as directed. Take 1/3 tablet by mouth three times a week on Tuesday, thursday, and saturday    . cephALEXin (KEFLEX) 500 MG capsule Take 1 capsule (500 mg total) by mouth 4 (four) times daily. 20 capsule 0  . Cholecalciferol (VITAMIN D PO) Take 4,000 Units by mouth daily.     Marland Kitchen diltiazem (CARDIZEM CD) 360 MG 24 hr capsule Take 1 capsule (360 mg total) by mouth daily. 30 capsule 11  . doxepin (SINEQUAN) 25 MG capsule Take 1 capsule (25 mg total) by mouth at bedtime. 30 capsule 5  . Iron Combinations (IRON COMPLEX PO) Take 1 tablet by mouth daily.    Marland Kitchen PAZEO 0.7 % SOLN Place 1 drop into both eyes daily.    .  potassium chloride SA (K-DUR,KLOR-CON) 20 MEQ tablet Take 1 tablet (20 mEq total) by mouth 2 (two) times daily. 180 tablet 6  . traMADol (ULTRAM) 50 MG tablet Take 1 tablet (50 mg total) by mouth every 6 (six) hours as needed. 15 tablet 0  . triamcinolone cream (KENALOG) 0.1 % Apply 1 application topically as directed. 30 g 1  . triamterene-hydrochlorothiazide (MAXZIDE-25) 37.5-25 MG per tablet Take 1 tablet by mouth daily. 30 tablet 6  . digoxin (LANOXIN) 0.125 MG tablet Take 1 tablet (125 mcg total) by mouth daily. (Patient not taking: Reported on 08/08/2015) 30 tablet 11   No current facility-administered medications for this visit.   Allergies:   Penicillins   Social History:  The patient  reports that she has never smoked. She has never used smokeless tobacco. She reports that she does not drink alcohol or use illicit drugs.   Family History:  The patient's family history includes Aneurysm in her brother; Dementia in her father; Heart disease in her father; Hepatitis C in her mother; Hypertension in her mother.   ROS:  Please see the history of present illness.  All other systems reviewed and negative.   PHYSICAL EXAM: VS:  BP 126/72 mmHg  Pulse 52  Ht 5\' 1"  (1.549 m)  Wt 180 lb (81.647 kg)  BMI 34.03 kg/m2  SpO2 99% Well nourished, well developed, in no acute distress HEENT: normal Neck: no JVD Cardiac:  normal S1, S2; RRR; no murmur Lungs:  clear to auscultation bilaterally, no wheezing, rhonchi or rales Abd: soft, nontender, no hepatomegaly Ext: no edema Skin: warm and dry Neuro:  CNs 2-12 intact, no focal abnormalities noted  EKG:  HR 62  NSR   ASSESSMENT AND PLAN:  [name] is a 59 y.o. female with past medical history of HTN, HLD, and Paroxysmal Atrial fibrillation (diagnosed 10/2011) who presents for post hospital follow up after a recent admission for atrial fib with RVR.    PAF- recent two episodes and admission for afib with RVR. She converted  spontaneously to sinus rhythm. Today she remains in NSR. The last episode possibly triggered by UTI. I will have her complete therapy for UTI, she is now on higher dose of Cardizem CD 120 -->360 mg po QD. -- CHA2DS2-VASc Score of 2, I will switch Eliquis to Xarelto. Discontinued Aspirin.  -- Her home dose of atenolol was continued. Started on Cardizem CD 360mg . -- Echo showed normal LV function without WM abnormality.  -- D/C digoxin started by PCP  Celiac artery stenosis- incidental finding on abdominal CT done for n/v at presentation.  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  -- Follows with vascular surgery follow up.  HLD- continue statin   HTN- Bp well controlled 116/60 mm Hg  -- Continue Maxide 37.5-25, Cardizem 360mg , atenelol 100mg  qd   Disposition:   FU in 4-6 weeks.   Signed, Vesta Mixer, PA-C, M[facility] 08/08/2015 9:54 AM    [facility] [address], [place], [place]  [address] Phone: [phone]; Fax: [phone]

## 2015-08-12 ENCOUNTER — Other Ambulatory Visit (HOSPITAL_COMMUNITY): Payer: Self-pay | Admitting: Urology

## 2015-08-12 ENCOUNTER — Other Ambulatory Visit: Payer: Self-pay | Admitting: Urology

## 2015-08-12 ENCOUNTER — Telehealth: Payer: Self-pay | Admitting: *Deleted

## 2015-08-12 DIAGNOSIS — N200 Calculus of kidney: Secondary | ICD-10-CM

## 2015-08-12 NOTE — Telephone Encounter (Signed)
Will be faxing pts cardiac and medication clearance per Dr Meda Coffee to Dr Karsten Ro at [facility].  Per Dr Meda Coffee the pt has no contraindication from a cardiac standpoint.  Pt should hold Xarelto the night before and morning of her Nephrolithotomy procedure.  Per Dr Meda Coffee the pt can restart Xarelto as soon as okayed by Urologist Dr Karsten Ro.

## 2015-08-18 ENCOUNTER — Encounter: Payer: Self-pay | Admitting: Internal Medicine

## 2015-08-18 ENCOUNTER — Telehealth: Payer: Self-pay | Admitting: Cardiology

## 2015-08-18 ENCOUNTER — Ambulatory Visit (INDEPENDENT_AMBULATORY_CARE_PROVIDER_SITE_OTHER): Payer: BC Managed Care – PPO | Admitting: Internal Medicine

## 2015-08-18 VITALS — BP 106/64 | HR 54 | Temp 97.8°F | Resp 16 | Ht 62.0 in | Wt 183.0 lb

## 2015-08-18 DIAGNOSIS — I480 Paroxysmal atrial fibrillation: Secondary | ICD-10-CM | POA: Diagnosis not present

## 2015-08-18 DIAGNOSIS — L309 Dermatitis, unspecified: Secondary | ICD-10-CM

## 2015-08-18 NOTE — Telephone Encounter (Signed)
Returned call to patient.She stated she has been urinating bright red blood since this past Saturday.Stated she started xarelto 20 mg daily on 08/08/15.Stated she needs surgery for a kidney stone has had bright red bleeding,but has become worse since started on xarelto.Has had lower abdominal pain,but no pain at present.Message sent to [place] for advice.

## 2015-08-18 NOTE — Progress Notes (Signed)
   Subjective:    Patient ID: [name], female    DOB: 31-Jul-1956, 59 y.o.   MRN: [id number]  HPI  Patient presents to the office for recheck of her atrial fibrillation and eczema.  She reports that since being off her eliquis and xarelto she is doing much better as far as her eczema goes.  She also reports that she is walking more and is no longer having palpitations and weakness due to her heart.  She is due to see the cardiology Nov 21st to see how she is doing then.  She was also diagnosed with a kidney stone and this is why she has been having frequent UTI and also hematuria. She is due to have a nephrolithotomy and also cystoscopy with lithotripsy.  She reports that the blood in her urine stopped once she was taken off the xarelto too.     Review of Systems  Constitutional: Negative for fever, chills and fatigue.  Respiratory: Negative for cough, chest tightness and shortness of breath.   Cardiovascular: Negative for chest pain, palpitations and leg swelling.  Gastrointestinal: Negative for nausea and vomiting.  Genitourinary: Positive for hematuria. Negative for dysuria, urgency and frequency.       Objective:   Physical Exam  Constitutional: She is oriented to person, place, and time. She appears well-developed and well-nourished. No distress.  HENT:  Head: Normocephalic.  Mouth/Throat: Oropharynx is clear and moist. No oropharyngeal exudate.  Eyes: Conjunctivae are normal. No scleral icterus.  Neck: Normal range of motion. Neck supple. No JVD present. No thyromegaly present.  Cardiovascular: Normal rate, regular rhythm, normal heart sounds and intact distal pulses.  Exam reveals no gallop and no friction rub.   No murmur heard. Pulmonary/Chest: Effort normal and breath sounds normal. No respiratory distress. She has no wheezes. She has no rales. She exhibits no tenderness.  Abdominal: Soft. Bowel sounds are normal. She exhibits no distension and no mass. There is no  tenderness. There is no rebound and no guarding.  Musculoskeletal: Normal range of motion.  Lymphadenopathy:    She has no cervical adenopathy.  Neurological: She is alert and oriented to person, place, and time.  Skin: Skin is warm and dry. Rash noted. She is not diaphoretic.  Eczema around the eyes and on bilateral hands.  Psychiatric: She has a normal mood and affect. Her behavior is normal. Judgment and thought content normal.  Nursing note and vitals reviewed.   Filed Vitals:   08/18/15 1619  BP: 106/64  Pulse: 54  Temp: 97.8 F (36.6 C)  Resp: 16        Assessment & Plan:    1. Paroxysmal atrial fibrillation (HCC) -saw cards -digoxin d/c  -stopped xarelto on baby asa only -cont cardizem and atenolol  2. Eczema -much improved with stop in xarelto and eliquis

## 2015-08-18 NOTE — Telephone Encounter (Signed)
Please advise her to stop taking xarelto and to take asa 81 mg po daily instead until the next visit with me.

## 2015-08-18 NOTE — Telephone Encounter (Signed)
New message    Pt C/O medication issue:  1. Name of Medication: Xarelto 20 mg   2. How are you currently taking this medication (dosage and times per day)? 20 mg once a day in pm   3. Are you having a reaction (difficulty breathing--STAT)? Urinate  blood   4. What is your medication issue? Wants to discuss with nurse.

## 2015-08-18 NOTE — Telephone Encounter (Signed)
Returned call to patient.Dr.Nelson advised to stop xarelto.Start aspirin 81 mg daily.Keep appointment with Richardson Dopp PA 09/08/15 at 3:40 pm.

## 2015-09-05 ENCOUNTER — Telehealth: Payer: Self-pay | Admitting: Internal Medicine

## 2015-09-05 ENCOUNTER — Other Ambulatory Visit: Payer: Self-pay | Admitting: Internal Medicine

## 2015-09-05 MED ORDER — CIPROFLOXACIN HCL 500 MG PO TABS: 500.0000 mg | ORAL_TABLET | Freq: Two times a day (BID) | ORAL | Status: DC

## 2015-09-05 NOTE — Telephone Encounter (Signed)
Patient has possible UTI? requesting rx [facility]. Has upcoming surgery, suggested ov, she said to send a note back, legs hurt like last UTI  Thank you, Leonie Douglas Referral Coordinator  [facility] [phone] ext. [phone] Fax [phone]

## 2015-09-07 NOTE — Progress Notes (Signed)
Cardiology Office Note   Date:  09/08/2015   ID:  [name], DOB October 14, 1956, MRN [id number]   Patient Care Team: Unk Pinto, MD as PCP - General (Internal Medicine) Warden Fillers, MD as Consulting Physician (Optometry) Kathie Rhodes, MD as Consulting Physician (Urology) Danella Sensing, MD as Consulting Physician (Dermatology) Alfonso Patten, RN as Registered Nurse Dorothy Spark, MD as Consulting Physician (Cardiology)    Chief Complaint  Patient presents with  . Follow-up  . Atrial Fibrillation     History of Present Illness: [name] is a 59 y.o. female with a hx of HTN, HL, PAF.  Admitted in 9/16 with AF with RVR.  She spontaneously converted to NSR with rate controlling therapy.  She was placed on Apixaban for anticoagulation therapy.  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  She was seen by vascular surgery and referred to GI.  Last seen by Dr. Ena Dawley 08/08/15.  She had been to the ED again with recurrent AF with RVR that spontaneously converted.  This was in the setting of a UTI.  Cardizem dose was increased.  Digoxin was stopped.  Eliquis was changed to Xarelto.  She developed hematuria secondary to nephrolithiasis.  Xarelto was stopped.  She has apparently undergone lithotripsy.    She returns for FU.  She is overall doing well.  She needs lithotripsy next month.  She denies any further hematuria since stopping her Xarelto.  She denies chest pain.  She denies exertional chest pain or dyspnea. She denies orthopnea, PND, edema.  No syncope.  She denies rapid palpitations.     Studies/Reports Reviewed Today:  Echo 07/10/15 EF 60-65%, no RWMA  Myoview 10/08/14 Normal stress nuclear study.  LV Ejection Fraction: 61%. LV Wall Motion: NL LV Function;  NL Wall Motion   Past Medical History  Diagnosis Date  . MRSA (methicillin resistant staph aureus) culture positive   . Eczema   . Hypercholesteremia   . Hypertension   . Kidney stones   . Anemia   . Vitamin D deficiency   . Paroxysmal atrial fibrillation ([place]) 10/2011, 06/2015  . GERD (gastroesophageal reflux disease)   . Atrial fibrillation ([place]) 2016    Past Surgical History  Procedure Laterality Date  . Breast surgery Left     biopsy-neg  . Kidney stone surgery       Current Outpatient Prescriptions  Medication Sig Dispense Refill  . ascorbic acid (VITAMIN C) 1000 MG tablet Take 1,000 mg by mouth daily.    Marland Kitchen aspirin EC 81 MG tablet Take 1 tablet (81 mg total) by mouth daily. 30 tablet 6  . atenolol (TENORMIN) 100 MG tablet Take 100 mg by mouth every morning.     Marland Kitchen atorvastatin (LIPITOR) 80 MG tablet Take 26.67 mg by mouth as directed. Take 1/3 tablet by mouth three times a week on Tuesday, thursday, and saturday    . cephALEXin (KEFLEX) 500 MG capsule Take 1 capsule (500 mg total) by mouth 4 (four) times daily. 20 capsule 0  . Cholecalciferol (VITAMIN D PO) Take 4,000 Units by mouth daily.     . ciprofloxacin (CIPRO) 500 MG tablet Take 1 tablet (500 mg total) by mouth 2 (two) times daily. 14 tablet 0  . diltiazem (CARDIZEM CD) 360 MG 24 hr capsule Take 1 capsule (360 mg total)  by mouth daily. 30 capsule 11  . doxepin (SINEQUAN) 25 MG capsule Take 1 capsule (25 mg total) by mouth at bedtime. 30 capsule 5  . Iron Combinations (IRON COMPLEX PO) Take 1 tablet by mouth daily.    Marland Kitchen PAZEO 0.7 % SOLN Place 1 drop into both eyes daily.    . potassium chloride SA (K-DUR,KLOR-CON) 20 MEQ tablet Take 1 tablet (20 mEq total) by mouth 2 (two) times daily. 180 tablet 6  . traMADol (ULTRAM) 50 MG tablet Take 1 tablet (50 mg total) by mouth every 6 (six) hours as needed. 15 tablet 0  . triamcinolone cream (KENALOG) 0.1 % Apply 1 application topically as directed. 30 g 1  .  triamterene-hydrochlorothiazide (MAXZIDE-25) 37.5-25 MG per tablet Take 1 tablet by mouth daily. 30 tablet 6   No current facility-administered medications for this visit.    Allergies:   Penicillins    Social History:   Social History   Social History  . Marital Status: Widowed    Spouse Name: N/A  . Number of Children: N/A  . Years of Education: N/A   Social History Main Topics  . Smoking status: Never Smoker   . Smokeless tobacco: Never Used  . Alcohol Use: No  . Drug Use: No  . Sexual Activity: Not Asked   Other Topics Concern  . None   Social History Narrative     Family History:   Family History  Problem Relation Age of Onset  . Hypertension Mother   . Hepatitis C Mother   . Heart disease Father   . Dementia Father   . Aneurysm Brother       ROS:   Please see the history of present illness.   Review of Systems  HENT: Positive for headaches.   Cardiovascular: Positive for dyspnea on exertion and leg swelling.  Hematologic/Lymphatic: Bruises/bleeds easily.  Musculoskeletal: Positive for back pain and joint pain.  Gastrointestinal: Positive for abdominal pain.  Genitourinary: Positive for hematuria.  All other systems reviewed and are negative.     PHYSICAL EXAM: VS:  BP 138/70 mmHg  Pulse 59  Ht 5\' 2"  (1.575 m)  Wt 183 lb 6.4 oz (83.19 kg)  BMI 33.54 kg/m2    Wt Readings from Last 3 Encounters:  09/08/15 183 lb 6.4 oz (83.19 kg)  08/18/15 183 lb (83.008 kg)  08/08/15 180 lb (81.647 kg)     GEN: Well nourished, well developed, in no acute distress HEENT: normal Neck: no JVD,  no masses Cardiac:  Normal S1/S2, RRR; no murmur ,  no rubs or gallops, no edema   Respiratory:  clear to auscultation bilaterally, no wheezing, rhonchi or rales. GI: soft, nontender, nondistended, + BS MS: no deformity or atrophy Skin: warm and dry  Neuro:  CNs II-XII intact, Strength and sensation are intact Psych: Normal affect   EKG:  EKG is not ordered  today.  It demonstrates:   n/a   Recent Labs: 04/28/2015: Magnesium 1.8 07/29/2015: TSH 0.675 08/03/2015: ALT 15; BUN 14; Creatinine, Ser 0.91; Hemoglobin 13.9; Platelets 291; Potassium 3.7; Sodium 140    Lipid Panel    Component Value Date/Time   CHOL 198 07/29/2015 1657   TRIG 89 07/29/2015 1657   HDL 57 07/29/2015 1657   CHOLHDL 3.5 07/29/2015 1657   VLDL 18 07/29/2015 1657   LDLCALC 123 07/29/2015 1657      ASSESSMENT AND PLAN:  1. PAF:  Maintaining NSR. She is off of anticoagulation secondary to hematuria in  the setting of nephrolithiasis.  Her bleeding has stopped since stopping the Xarelto. CHADS2-VASc=2.  Would recommend resuming Xarelto once it is felt to be safe from a urological standpoint.  I showed her how to check her pulse. She should check this daily and call us if her pulse is too fast to count.  Continue ASA for now.  Continue beta-blocker, calcium channel blocker.   2. HTN:  Controlled.   3. Hyperlipidemia:  Continue statin.   4. Nephrolithiasis:  She has lithotripsy set up for next month.  The patient does not have any unstable cardiac conditions.  Upon evaluation today, she can achieve 4 METs or greater without anginal symptoms. Myoview in 12/15 was low risk.  According to [facility] and AHA guidelines, she requires no further cardiac workup prior to her noncardiac surgery and should be at acceptable risk.  Our service is available as necessary in the perioperative period.      Medication Changes: Current medicines are reviewed at length with the patient today.  Concerns regarding medicines are as outlined above.  The following changes have been made:   Discontinued Medications   No medications on file   Modified Medications   No medications on file   New Prescriptions   No medications on file   Labs/ tests ordered today include:   No orders of the defined types were placed in this encounter.     Disposition:    FU with Dr. Ena Dawley 10/2015 as  planned.     Signed, Versie Starks, MHS 09/08/2015 4:33 PM    [facility] [address], [place], [place]  [address] Phone: [phone]; Fax: [phone]

## 2015-09-08 ENCOUNTER — Other Ambulatory Visit: Payer: Self-pay | Admitting: Internal Medicine

## 2015-09-08 ENCOUNTER — Ambulatory Visit (INDEPENDENT_AMBULATORY_CARE_PROVIDER_SITE_OTHER): Payer: BC Managed Care – PPO | Admitting: Physician Assistant

## 2015-09-08 ENCOUNTER — Encounter: Payer: Self-pay | Admitting: Physician Assistant

## 2015-09-08 VITALS — BP 138/70 | HR 59 | Ht 62.0 in | Wt 183.4 lb

## 2015-09-08 DIAGNOSIS — I10 Essential (primary) hypertension: Secondary | ICD-10-CM

## 2015-09-08 DIAGNOSIS — E785 Hyperlipidemia, unspecified: Secondary | ICD-10-CM

## 2015-09-08 DIAGNOSIS — I480 Paroxysmal atrial fibrillation: Secondary | ICD-10-CM

## 2015-09-08 DIAGNOSIS — Z01810 Encounter for preprocedural cardiovascular examination: Secondary | ICD-10-CM

## 2015-09-08 NOTE — Patient Instructions (Signed)
Medication Instructions:  Your physician recommends that you continue on your current medications as directed. Please refer to the Current Medication list given to you today.   Labwork: NONE  Testing/Procedures: NONE  Follow-Up: YOU HAVE AN APPT WITH DR. Meda Coffee 10/24/15  Any Other Special Instructions Will Be Listed Below (If Applicable). CHECK YOUR PULSE EVERYDAY; IF YOUR PULSE IS TOO FAST TO CHECK THEN PLEASE CALL THE OFFICE TO SPEAK WITH THE NURSE   If you need a refill on your cardiac medications before your next appointment, please call your pharmacy.

## 2015-09-17 ENCOUNTER — Telehealth: Payer: Self-pay | Admitting: *Deleted

## 2015-09-17 NOTE — Telephone Encounter (Signed)
Ok Make sure patient knows she can resume her Xarelto 2 days after her procedure for her kidney stone. Richardson Dopp, PA-C   09/17/2015 1:55 PM

## 2015-09-17 NOTE — Telephone Encounter (Signed)
I s/w Gwen at [facility] about pt's Xarelto. Dr. Karsten Ro said ok to resume Xarelto 2 days after procedure only if urine is clear; if not clear then to wait 2 more days = 4 days s/p procedure before resuming Xarelto.

## 2015-09-18 ENCOUNTER — Ambulatory Visit (INDEPENDENT_AMBULATORY_CARE_PROVIDER_SITE_OTHER): Payer: BC Managed Care – PPO | Admitting: Internal Medicine

## 2015-09-18 ENCOUNTER — Encounter: Payer: Self-pay | Admitting: Internal Medicine

## 2015-09-18 VITALS — BP 126/82 | HR 78 | Temp 97.8°F | Resp 18 | Ht 62.0 in

## 2015-09-18 DIAGNOSIS — H60391 Other infective otitis externa, right ear: Secondary | ICD-10-CM | POA: Diagnosis not present

## 2015-09-18 MED ORDER — AZITHROMYCIN 250 MG PO TABS: ORAL_TABLET | ORAL | Status: DC

## 2015-09-18 MED ORDER — OFLOXACIN 0.3 % OT SOLN: 10.0000 [drp] | Freq: Every day | OTIC | Status: DC

## 2015-09-18 MED ORDER — DOXEPIN HCL 50 MG PO CAPS: 50.0000 mg | ORAL_CAPSULE | Freq: Every day | ORAL | Status: DC

## 2015-09-18 NOTE — Progress Notes (Signed)
   Subjective:    Patient ID: [name], female    DOB: 1956-07-11, 59 y.o.   MRN: [id number]  Otalgia  Pertinent negatives include no rhinorrhea or sore throat.  Patient presents to the right ear pain x 1 week.  She reports that it has been draining and it has been painful.  She reports that she has been putting some oils in her ear.  She reports that she has been taking tylenol and that is helping.      Review of Systems  Constitutional: Negative for fever, chills and fatigue.  HENT: Positive for ear pain and tinnitus. Negative for congestion, postnasal drip, rhinorrhea, sinus pressure and sore throat.        Objective:   Physical Exam  Constitutional: She is oriented to person, place, and time. She appears well-developed and well-nourished. No distress.  HENT:  Head: Normocephalic.  Right Ear: Hearing normal. There is swelling and tenderness.  Nose: Mucosal edema present.  Mouth/Throat: Oropharynx is clear and moist. No oropharyngeal exudate.  Right TM not visible due to canal swelling.  No visible discharge in the ear canal. Pinna tender with movement  Eyes: Conjunctivae are normal. No scleral icterus.  Neck: Normal range of motion. Neck supple. No JVD present. No thyromegaly present.  Cardiovascular: Normal rate, regular rhythm and intact distal pulses.  Exam reveals no gallop and no friction rub.   No murmur heard. Pulmonary/Chest: Effort normal and breath sounds normal. No respiratory distress. She has no wheezes. She has no rales. She exhibits no tenderness.  Musculoskeletal: Normal range of motion.  Lymphadenopathy:    She has no cervical adenopathy.  Neurological: She is alert and oriented to person, place, and time.  Skin: Skin is warm and dry. She is not diaphoretic.  Psychiatric: She has a normal mood and affect. Her behavior is normal. Judgment and thought content normal.  Nursing note and vitals reviewed.   Filed Vitals:   09/18/15 1040  BP: 126/82   Pulse: 78  Temp: 97.8 F (36.6 C)  Resp: 18          Assessment & Plan:     1. Otitis, externa, infective, right -zpak -ofloxacin drops -avoid submersion and getting wet -patient to call if no relief.

## 2015-09-26 ENCOUNTER — Encounter (HOSPITAL_COMMUNITY): Payer: Self-pay

## 2015-09-26 NOTE — Patient Instructions (Addendum)
YOUR PROCEDURE IS SCHEDULED ON :  10/06/15  REPORT TO [facility] MAIN ENTRANCE FOLLOW SIGNS TO EAST ELEVATOR - GO TO 3rd FLOOR CHECK IN AT 3 EAST NURSES STATION (SHORT STAY) AT:  9:00 AM  CALL THIS NUMBER IF YOU HAVE PROBLEMS THE MORNING OF SURGERY [phone]  REMEMBER:ONLY 1 PER PERSON MAY GO TO SHORT STAY WITH YOU TO GET READY THE MORNING OF YOUR SURGERY  DO NOT EAT FOOD OR DRINK LIQUIDS AFTER MIDNIGHT  TAKE THESE MEDICINES THE MORNING OF SURGERY: CARDIZEM / ATENOLOL  YOU MAY NOT HAVE ANY METAL ON YOUR BODY INCLUDING HAIR PINS AND PIERCING'S. DO NOT WEAR JEWELRY, MAKEUP, LOTIONS, POWDERS OR PERFUMES. DO NOT WEAR NAIL POLISH. DO NOT SHAVE 48 HRS PRIOR TO SURGERY. MEN MAY SHAVE FACE AND NECK.  DO NOT [place]. [place] IS NOT RESPONSIBLE FOR VALUABLES.  CONTACTS, DENTURES OR PARTIALS MAY NOT BE WORN TO SURGERY. LEAVE SUITCASE IN CAR. CAN BE BROUGHT TO ROOM AFTER SURGERY.  PATIENTS DISCHARGED THE DAY OF SURGERY WILL NOT BE ALLOWED TO DRIVE HOME.  PLEASE READ OVER THE FOLLOWING INSTRUCTION SHEETS _________________________________________________________________________________                                          [place] - PREPARING FOR SURGERY  Before surgery, you can play an important role.  Because skin is not sterile, your skin needs to be as free of germs as possible.  You can reduce the number of germs on your skin by washing with CHG (chlorahexidine gluconate) soap before surgery.  CHG is an antiseptic cleaner which kills germs and bonds with the skin to continue killing germs even after washing. Please DO NOT use if you have an allergy to CHG or antibacterial soaps.  If your skin becomes reddened/irritated stop using the CHG and inform your nurse when you arrive at Short Stay. Do not shave (including legs and underarms) for at least 48 hours prior to the first CHG shower.  You may shave your face. Please follow these  instructions carefully:   1.  Shower with CHG Soap the night before surgery and the  morning of Surgery.   2.  If you choose to wash your hair, wash your hair first as usual with your  normal  Shampoo.   3.  After you shampoo, rinse your hair and body thoroughly to remove the  shampoo.                                         4.  Use CHG as you would any other liquid soap.  You can apply chg directly  to the skin and wash . Gently wash with scrungie or clean wascloth    5.  Apply the CHG Soap to your body ONLY FROM THE NECK DOWN.   Do not use on open                           Wound or open sores. Avoid contact with eyes, ears mouth and genitals (private parts).                        Genitals (private parts) with your normal soap.  6.  Wash thoroughly, paying special attention to the area where your surgery  will be performed.   7.  Thoroughly rinse your body with warm water from the neck down.   8.  DO NOT shower/wash with your normal soap after using and rinsing off  the CHG Soap .                9.  Pat yourself dry with a clean towel.             10.  Wear clean night clothes to bed after shower             11.  Place clean sheets on your bed the night of your first shower and do not  sleep with pets.  Day of Surgery : Do not apply any lotions/deodorants the morning of surgery.  Please wear clean clothes to the hospital/surgery center.  FAILURE TO FOLLOW THESE INSTRUCTIONS MAY RESULT IN THE CANCELLATION OF YOUR SURGERY    PATIENT SIGNATURE_________________________________  ______________________________________________________________________

## 2015-09-29 ENCOUNTER — Encounter (HOSPITAL_COMMUNITY): Payer: Self-pay

## 2015-09-29 ENCOUNTER — Encounter (HOSPITAL_COMMUNITY)
Admission: RE | Admit: 2015-09-29 | Discharge: 2015-09-29 | Disposition: A | Discharge status: Home / Self Care | Payer: BC Managed Care – PPO | Source: Ambulatory Visit | Attending: Urology | Admitting: Urology

## 2015-09-29 DIAGNOSIS — Z01812 Encounter for preprocedural laboratory examination: Secondary | ICD-10-CM | POA: Diagnosis not present

## 2015-09-29 DIAGNOSIS — Z01810 Encounter for preprocedural cardiovascular examination: Secondary | ICD-10-CM | POA: Diagnosis present

## 2015-09-29 HISTORY — DX: Cardiac arrhythmia, unspecified: I49.9

## 2015-09-29 LAB — BASIC METABOLIC PANEL
ANION GAP: 6 (ref 5–15)
BUN: 11 mg/dL (ref 6–20)
CALCIUM: 9.4 mg/dL (ref 8.9–10.3)
CO2: 27 mmol/L (ref 22–32)
Chloride: 106 mmol/L (ref 101–111)
Creatinine, Ser: 0.78 mg/dL (ref 0.44–1.00)
GFR calc Af Amer: >60 mL/min (ref >60)
Glucose, Bld: 82 mg/dL (ref 65–99)
POTASSIUM: 4 mmol/L (ref 3.5–5.1)
SODIUM: 139 mmol/L (ref 135–145)

## 2015-09-29 LAB — PROTIME-INR
INR: 1.02 (ref 0.00–1.49)
PROTHROMBIN TIME: 13.6 s (ref 11.6–15.2)

## 2015-09-29 LAB — CBC
HCT: 40.2 % (ref 36.0–46.0)
Hemoglobin: 13 g/dL (ref 12.0–15.0)
MCH: 30 pg (ref 26.0–34.0)
MCHC: 32.3 g/dL (ref 30.0–36.0)
MCV: 92.8 fL (ref 78.0–100.0)
PLATELETS: 281 10*3/uL (ref 150–400)
RBC: 4.33 MIL/uL (ref 3.87–5.11)
RDW: 14.2 % (ref 11.5–15.5)
WBC: 5.9 10*3/uL (ref 4.0–10.5)

## 2015-10-01 NOTE — Progress Notes (Signed)
EKG reviewed by Dr.Denenny - no action needed at this time - will evaluate on day of surg

## 2015-10-03 ENCOUNTER — Other Ambulatory Visit: Payer: Self-pay | Admitting: Radiology

## 2015-10-03 ENCOUNTER — Encounter: Payer: Self-pay | Admitting: Vascular Surgery

## 2015-10-06 ENCOUNTER — Encounter (HOSPITAL_COMMUNITY): Payer: Self-pay

## 2015-10-06 ENCOUNTER — Ambulatory Visit (HOSPITAL_COMMUNITY): Payer: BC Managed Care – PPO | Admitting: Anesthesiology

## 2015-10-06 ENCOUNTER — Encounter (HOSPITAL_COMMUNITY): Payer: Self-pay | Admitting: Certified Registered Nurse Anesthetist

## 2015-10-06 ENCOUNTER — Observation Stay (HOSPITAL_COMMUNITY): Payer: BC Managed Care – PPO

## 2015-10-06 ENCOUNTER — Encounter (HOSPITAL_COMMUNITY): Payer: Self-pay | Admitting: *Deleted

## 2015-10-06 ENCOUNTER — Observation Stay (HOSPITAL_COMMUNITY)
Admission: RE | Admit: 2015-10-06 | Discharge: 2015-10-07 | Disposition: A | Discharge status: Home / Self Care | Payer: BC Managed Care – PPO | Source: Ambulatory Visit | Attending: Urology | Admitting: Urology

## 2015-10-06 ENCOUNTER — Encounter (HOSPITAL_COMMUNITY)
Admission: RE | Disposition: A | Discharge status: Home / Self Care | Payer: Self-pay | Source: Ambulatory Visit | Attending: Urology

## 2015-10-06 ENCOUNTER — Ambulatory Visit (HOSPITAL_COMMUNITY): Payer: BC Managed Care – PPO

## 2015-10-06 ENCOUNTER — Ambulatory Visit (HOSPITAL_COMMUNITY)
Admission: RE | Admit: 2015-10-06 | Discharge: 2015-10-06 | Disposition: A | Discharge status: Home / Self Care | Payer: BC Managed Care – PPO | Source: Ambulatory Visit | Attending: Urology | Admitting: Urology

## 2015-10-06 DIAGNOSIS — Z7901 Long term (current) use of anticoagulants: Secondary | ICD-10-CM | POA: Insufficient documentation

## 2015-10-06 DIAGNOSIS — Z7982 Long term (current) use of aspirin: Secondary | ICD-10-CM | POA: Insufficient documentation

## 2015-10-06 DIAGNOSIS — K219 Gastro-esophageal reflux disease without esophagitis: Secondary | ICD-10-CM | POA: Insufficient documentation

## 2015-10-06 DIAGNOSIS — Z87442 Personal history of urinary calculi: Secondary | ICD-10-CM | POA: Diagnosis not present

## 2015-10-06 DIAGNOSIS — R112 Nausea with vomiting, unspecified: Secondary | ICD-10-CM

## 2015-10-06 DIAGNOSIS — Z79899 Other long term (current) drug therapy: Secondary | ICD-10-CM | POA: Diagnosis not present

## 2015-10-06 DIAGNOSIS — N200 Calculus of kidney: Secondary | ICD-10-CM | POA: Diagnosis present

## 2015-10-06 DIAGNOSIS — T8859XA Other complications of anesthesia, initial encounter: Secondary | ICD-10-CM

## 2015-10-06 DIAGNOSIS — I120 Hypertensive chronic kidney disease with stage 5 chronic kidney disease or end stage renal disease: Secondary | ICD-10-CM | POA: Insufficient documentation

## 2015-10-06 DIAGNOSIS — I480 Paroxysmal atrial fibrillation: Secondary | ICD-10-CM | POA: Insufficient documentation

## 2015-10-06 DIAGNOSIS — R001 Bradycardia, unspecified: Secondary | ICD-10-CM | POA: Insufficient documentation

## 2015-10-06 DIAGNOSIS — E7800 Pure hypercholesterolemia, unspecified: Secondary | ICD-10-CM | POA: Diagnosis not present

## 2015-10-06 DIAGNOSIS — N132 Hydronephrosis with renal and ureteral calculous obstruction: Secondary | ICD-10-CM | POA: Diagnosis not present

## 2015-10-06 DIAGNOSIS — Z9889 Other specified postprocedural states: Secondary | ICD-10-CM

## 2015-10-06 HISTORY — PX: CYSTOSCOPY WITH HOLMIUM LASER LITHOTRIPSY: SHX6639

## 2015-10-06 HISTORY — PX: NEPHROLITHOTOMY: SHX5134

## 2015-10-06 HISTORY — DX: Other complications of anesthesia, initial encounter: T88.59XA

## 2015-10-06 HISTORY — DX: Nausea with vomiting, unspecified: R11.2

## 2015-10-06 HISTORY — DX: Other specified postprocedural states: Z98.890

## 2015-10-06 LAB — BASIC METABOLIC PANEL
ANION GAP: 9 (ref 5–15)
BUN: 11 mg/dL (ref 6–20)
CHLORIDE: 104 mmol/L (ref 101–111)
CO2: 29 mmol/L (ref 22–32)
Calcium: 9.5 mg/dL (ref 8.9–10.3)
Creatinine, Ser: 0.76 mg/dL (ref 0.44–1.00)
GFR calc Af Amer: >60 mL/min (ref >60)
Glucose, Bld: 86 mg/dL (ref 65–99)
POTASSIUM: 3.8 mmol/L (ref 3.5–5.1)
Sodium: 142 mmol/L (ref 135–145)

## 2015-10-06 LAB — CBC WITH DIFFERENTIAL/PLATELET
BASOS PCT: 1 %
Basophils Absolute: 0 10*3/uL (ref 0.0–0.1)
EOS ABS: 0.3 10*3/uL (ref 0.0–0.7)
EOS PCT: 7 %
HCT: 42.5 % (ref 36.0–46.0)
Hemoglobin: 13.9 g/dL (ref 12.0–15.0)
LYMPHS PCT: 31 %
Lymphs Abs: 1.4 10*3/uL (ref 0.7–4.0)
MCH: 30.9 pg (ref 26.0–34.0)
MCHC: 32.7 g/dL (ref 30.0–36.0)
MCV: 94.4 fL (ref 78.0–100.0)
MONO ABS: 0.6 10*3/uL (ref 0.1–1.0)
Monocytes Relative: 13 %
NEUTROS ABS: 2.2 10*3/uL (ref 1.7–7.7)
NEUTROS PCT: 48 %
Platelets: 241 10*3/uL (ref 150–400)
RBC: 4.5 MIL/uL (ref 3.87–5.11)
RDW: 14.5 % (ref 11.5–15.5)
WBC: 4.4 10*3/uL (ref 4.0–10.5)

## 2015-10-06 LAB — PROTIME-INR
INR: 0.9 (ref 0.00–1.49)
PROTHROMBIN TIME: 12.4 s (ref 11.6–15.2)

## 2015-10-06 SURGERY — NEPHROLITHOTOMY PERCUTANEOUS
Anesthesia: General | Laterality: Right

## 2015-10-06 MED ORDER — DEXAMETHASONE SODIUM PHOSPHATE 10 MG/ML IJ SOLN
INTRAMUSCULAR | Status: AC
  Filled 2015-10-06: qty 1

## 2015-10-06 MED ORDER — LIDOCAINE HCL 1 % IJ SOLN
INTRAMUSCULAR | Status: AC
  Filled 2015-10-06: qty 20

## 2015-10-06 MED ORDER — ONDANSETRON HCL 4 MG/2ML IJ SOLN
INTRAMUSCULAR | Status: AC
  Filled 2015-10-06: qty 2

## 2015-10-06 MED ORDER — MIDAZOLAM HCL 2 MG/2ML IJ SOLN
INTRAMUSCULAR | Status: AC | PRN
  Administered 2015-10-06 (×4): 1 mg via INTRAVENOUS
  Administered 2015-10-06: 0.5 mg via INTRAVENOUS
  Administered 2015-10-06: 1 mg via INTRAVENOUS
  Administered 2015-10-06: 0.5 mg via INTRAVENOUS
  Administered 2015-10-06: 1 mg via INTRAVENOUS

## 2015-10-06 MED ORDER — ONDANSETRON HCL 4 MG/2ML IJ SOLN
INTRAMUSCULAR | Status: DC | PRN
  Administered 2015-10-06: 4 mg via INTRAVENOUS

## 2015-10-06 MED ORDER — CEFAZOLIN SODIUM 1-5 GM-% IV SOLN
1.0000 g | Freq: Three times a day (TID) | INTRAVENOUS | Status: AC
  Administered 2015-10-06 (×2): 1 g via INTRAVENOUS
  Filled 2015-10-06 (×3): qty 50

## 2015-10-06 MED ORDER — CIPROFLOXACIN IN D5W 400 MG/200ML IV SOLN
INTRAVENOUS | Status: AC
  Filled 2015-10-06: qty 200

## 2015-10-06 MED ORDER — SUCCINYLCHOLINE CHLORIDE 20 MG/ML IJ SOLN
INTRAMUSCULAR | Status: DC | PRN
  Administered 2015-10-06: 100 mg via INTRAVENOUS

## 2015-10-06 MED ORDER — PROPOFOL 10 MG/ML IV BOLUS
INTRAVENOUS | Status: AC
  Filled 2015-10-06: qty 20

## 2015-10-06 MED ORDER — ROCURONIUM BROMIDE 100 MG/10ML IV SOLN
INTRAVENOUS | Status: DC | PRN
  Administered 2015-10-06: 50 mg via INTRAVENOUS

## 2015-10-06 MED ORDER — CEFAZOLIN SODIUM-DEXTROSE 2-3 GM-% IV SOLR
2.0000 g | INTRAVENOUS | Status: AC
  Administered 2015-10-06: 2 g via INTRAVENOUS

## 2015-10-06 MED ORDER — BELLADONNA ALKALOIDS-OPIUM 16.2-60 MG RE SUPP: 1.0000 | Freq: Four times a day (QID) | RECTAL | Status: DC | PRN

## 2015-10-06 MED ORDER — CIPROFLOXACIN IN D5W 400 MG/200ML IV SOLN
400.0000 mg | Freq: Once | INTRAVENOUS | Status: AC
  Administered 2015-10-06: 400 mg via INTRAVENOUS
  Filled 2015-10-06: qty 200

## 2015-10-06 MED ORDER — SODIUM CHLORIDE 0.9 % IR SOLN
Status: DC | PRN
  Administered 2015-10-06: 9000 mL

## 2015-10-06 MED ORDER — MIDAZOLAM HCL 2 MG/2ML IJ SOLN
INTRAMUSCULAR | Status: AC
  Filled 2015-10-06: qty 2

## 2015-10-06 MED ORDER — PROPOFOL 10 MG/ML IV BOLUS
INTRAVENOUS | Status: DC | PRN
Start: 2015-10-06 — End: 2015-10-06
  Administered 2015-10-06: 150 mg via INTRAVENOUS

## 2015-10-06 MED ORDER — HYDROMORPHONE HCL 1 MG/ML IJ SOLN
0.2500 mg | INTRAMUSCULAR | Status: DC | PRN
  Administered 2015-10-06 (×2): 0.5 mg via INTRAVENOUS

## 2015-10-06 MED ORDER — LIDOCAINE HCL (CARDIAC) 20 MG/ML IV SOLN
INTRAVENOUS | Status: AC
  Filled 2015-10-06: qty 5

## 2015-10-06 MED ORDER — EPHEDRINE SULFATE 50 MG/ML IJ SOLN
INTRAMUSCULAR | Status: DC | PRN
  Administered 2015-10-06 (×2): 5 mg via INTRAVENOUS
  Administered 2015-10-06: 10 mg via INTRAVENOUS

## 2015-10-06 MED ORDER — FENTANYL CITRATE (PF) 100 MCG/2ML IJ SOLN
INTRAMUSCULAR | Status: AC | PRN
  Administered 2015-10-06 (×3): 25 ug via INTRAVENOUS
  Administered 2015-10-06: 50 ug via INTRAVENOUS

## 2015-10-06 MED ORDER — OXYCODONE-ACETAMINOPHEN 5-325 MG PO TABS: 1.0000 | ORAL_TABLET | ORAL | Status: DC | PRN

## 2015-10-06 MED ORDER — FENTANYL CITRATE (PF) 250 MCG/5ML IJ SOLN
INTRAMUSCULAR | Status: AC
  Filled 2015-10-06: qty 5

## 2015-10-06 MED ORDER — ACETAMINOPHEN 325 MG PO TABS
650.0000 mg | ORAL_TABLET | ORAL | Status: DC | PRN
  Administered 2015-10-06: 650 mg via ORAL
  Filled 2015-10-06: qty 2

## 2015-10-06 MED ORDER — LACTATED RINGERS IV SOLN: INTRAVENOUS | Status: DC

## 2015-10-06 MED ORDER — SODIUM CHLORIDE 0.9 % IV SOLN
INTRAVENOUS | Status: DC
  Administered 2015-10-06: 08:00:00 via INTRAVENOUS

## 2015-10-06 MED ORDER — MIDAZOLAM HCL 2 MG/2ML IJ SOLN
INTRAMUSCULAR | Status: AC
  Filled 2015-10-06: qty 4

## 2015-10-06 MED ORDER — ROCURONIUM BROMIDE 100 MG/10ML IV SOLN
INTRAVENOUS | Status: AC
  Filled 2015-10-06: qty 1

## 2015-10-06 MED ORDER — FENTANYL CITRATE (PF) 100 MCG/2ML IJ SOLN
INTRAMUSCULAR | Status: AC
  Filled 2015-10-06: qty 4

## 2015-10-06 MED ORDER — MIDAZOLAM HCL 2 MG/2ML IJ SOLN
INTRAMUSCULAR | Status: AC
  Filled 2015-10-06: qty 6

## 2015-10-06 MED ORDER — LACTATED RINGERS IV SOLN
INTRAVENOUS | Status: DC | PRN
  Administered 2015-10-06 (×2): via INTRAVENOUS

## 2015-10-06 MED ORDER — CEFAZOLIN SODIUM-DEXTROSE 2-3 GM-% IV SOLR
INTRAVENOUS | Status: AC
  Filled 2015-10-06: qty 50

## 2015-10-06 MED ORDER — SODIUM CHLORIDE 0.9 % IV SOLN
INTRAVENOUS | Status: DC
  Administered 2015-10-06: 17:00:00 via INTRAVENOUS

## 2015-10-06 MED ORDER — DEXAMETHASONE SODIUM PHOSPHATE 10 MG/ML IJ SOLN
INTRAMUSCULAR | Status: DC | PRN
  Administered 2015-10-06: 10 mg via INTRAVENOUS

## 2015-10-06 MED ORDER — IOHEXOL 300 MG/ML  SOLN
45.0000 mL | Freq: Once | INTRAMUSCULAR | Status: AC | PRN
  Administered 2015-10-06: 45 mL

## 2015-10-06 MED ORDER — ONDANSETRON HCL 4 MG/2ML IJ SOLN
4.0000 mg | INTRAMUSCULAR | Status: DC | PRN
Start: 2015-10-06 — End: 2015-10-07
  Administered 2015-10-06 – 2015-10-07 (×2): 4 mg via INTRAVENOUS
  Filled 2015-10-06 (×2): qty 2

## 2015-10-06 MED ORDER — GLYCOPYRROLATE 0.2 MG/ML IJ SOLN
INTRAMUSCULAR | Status: DC | PRN
  Administered 2015-10-06: .2 mg via INTRAVENOUS

## 2015-10-06 MED ORDER — SUGAMMADEX SODIUM 500 MG/5ML IV SOLN
INTRAVENOUS | Status: DC | PRN
  Administered 2015-10-06: 400 mg via INTRAVENOUS

## 2015-10-06 MED ORDER — LIDOCAINE HCL (CARDIAC) 20 MG/ML IV SOLN
INTRAVENOUS | Status: DC | PRN
  Administered 2015-10-06: 50 mg via INTRAVENOUS

## 2015-10-06 MED ORDER — HYDROMORPHONE HCL 1 MG/ML IJ SOLN
INTRAMUSCULAR | Status: AC
  Filled 2015-10-06: qty 1

## 2015-10-06 MED ORDER — HYDROMORPHONE HCL 1 MG/ML IJ SOLN: 0.5000 mg | INTRAMUSCULAR | Status: DC | PRN

## 2015-10-06 SURGICAL SUPPLY — 52 items
APL ESCP 34 STRL LF DISP (HEMOSTASIS) ×1
APL SKNCLS STERI-STRIP NONHPOA (GAUZE/BANDAGES/DRESSINGS) ×1
APPLICATOR SURGIFLO ENDO (HEMOSTASIS) ×1 IMPLANT
BAG URINE DRAINAGE (UROLOGICAL SUPPLIES) IMPLANT
BASKET ZERO TIP NITINOL 2.4FR (BASKET) IMPLANT
BENZOIN TINCTURE PRP APPL 2/3 (GAUZE/BANDAGES/DRESSINGS) ×2 IMPLANT
BLADE SURG 15 STRL LF DISP TIS (BLADE) ×1 IMPLANT
BLADE SURG 15 STRL SS (BLADE) ×2
BSKT STON RTRVL ZERO TP 2.4FR (BASKET)
CATCHER STONE W/TUBE ADAPTER (UROLOGICAL SUPPLIES) IMPLANT
CATH FOLEY 2W COUNCIL 20FR 5CC (CATHETERS) IMPLANT
CATH FOLEY 2WAY SLVR  5CC 18FR (CATHETERS) ×1
CATH FOLEY 2WAY SLVR 5CC 18FR (CATHETERS) IMPLANT
CATH IMAGER II 65CM (CATHETERS) IMPLANT
CATH X-FORCE N30 NEPHROSTOMY (TUBING) ×2 IMPLANT
COVER SURGICAL LIGHT HANDLE (MISCELLANEOUS) ×2 IMPLANT
DRAPE C-ARM 42X120 X-RAY (DRAPES) ×2 IMPLANT
DRAPE LINGEMAN PERC (DRAPES) ×2 IMPLANT
DRAPE SURG IRRIG POUCH 19X23 (DRAPES) ×2 IMPLANT
DRSG PAD ABDOMINAL 8X10 ST (GAUZE/BANDAGES/DRESSINGS) ×4 IMPLANT
DRSG TEGADERM 4X4.75 (GAUZE/BANDAGES/DRESSINGS) ×1 IMPLANT
DRSG TEGADERM 8X12 (GAUZE/BANDAGES/DRESSINGS) ×2 IMPLANT
FIBER LASER FLEXIVA 200 (UROLOGICAL SUPPLIES) IMPLANT
FIBER LASER FLEXIVA 365 (UROLOGICAL SUPPLIES) IMPLANT
FIBER LASER TRAC TIP (UROLOGICAL SUPPLIES) IMPLANT
FLOSEAL 10ML (HEMOSTASIS) ×1 IMPLANT
GAUZE SPONGE 4X4 12PLY STRL (GAUZE/BANDAGES/DRESSINGS) ×2 IMPLANT
GLOVE BIOGEL M 8.0 STRL (GLOVE) ×2 IMPLANT
GOWN STRL REUS W/TWL XL LVL3 (GOWN DISPOSABLE) ×2 IMPLANT
GUIDEWIRE AMPLAZ .035X145 (WIRE) ×4 IMPLANT
GUIDEWIRE STR DUAL SENSOR (WIRE) IMPLANT
HOLDER FOLEY CATH W/STRAP (MISCELLANEOUS) ×2 IMPLANT
KIT BASIN OR (CUSTOM PROCEDURE TRAY) ×2 IMPLANT
MANIFOLD NEPTUNE II (INSTRUMENTS) ×2 IMPLANT
NS IRRIG 1000ML POUR BTL (IV SOLUTION) ×2 IMPLANT
PACK CYSTO (CUSTOM PROCEDURE TRAY) ×2 IMPLANT
PROBE LITHOCLAST ULTRA 3.8X403 (UROLOGICAL SUPPLIES) IMPLANT
PROBE PNEUMATIC 1.0MMX570MM (UROLOGICAL SUPPLIES) IMPLANT
SET IRRIG Y TYPE TUR BLADDER L (SET/KITS/TRAYS/PACK) ×2 IMPLANT
SET WARMING FLUID IRRIGATION (MISCELLANEOUS) IMPLANT
SHEATH PEELAWAY SET 9 (SHEATH) ×2 IMPLANT
STONE CATCHER W/TUBE ADAPTER (UROLOGICAL SUPPLIES) IMPLANT
SURGIFLO W/THROMBIN 8M KIT (HEMOSTASIS) IMPLANT
SUT MNCRL AB 4-0 PS2 18 (SUTURE) ×1 IMPLANT
SUT SILK 2 0 30  PSL (SUTURE)
SUT SILK 2 0 30 PSL (SUTURE) IMPLANT
SYR 20CC LL (SYRINGE) ×4 IMPLANT
SYRINGE 10CC LL (SYRINGE) ×2 IMPLANT
TOWEL OR NON WOVEN STRL DISP B (DISPOSABLE) ×2 IMPLANT
TRAY FOLEY W/METER SILVER 14FR (SET/KITS/TRAYS/PACK) IMPLANT
TRAY FOLEY W/METER SILVER 16FR (SET/KITS/TRAYS/PACK) IMPLANT
TUBING CONNECTING 10 (TUBING) ×4 IMPLANT

## 2015-10-06 NOTE — Transfer of Care (Signed)
Immediate Anesthesia Transfer of Care Note  Patient: [name]  Procedure(s) Performed: Procedure(s): NEPHROLITHOTOMY PERCUTANEOUS (Right) CYSTOSCOPY WITH HOLMIUM LASER LITHOTRIPSY (Right)  Patient Location: PACU  Anesthesia Type:General  Level of Consciousness: awake, oriented, patient cooperative, lethargic and responds to stimulation  Airway & Oxygen Therapy: Patient Spontanous Breathing and Patient connected to face mask oxygen  Post-op Assessment: Report given to RN, Post -op Vital signs reviewed and stable and Patient moving all extremities  Post vital signs: Reviewed and stable  Last Vitals:  Filed Vitals:   10/06/15 0744  BP: 142/73  Pulse: 53  Temp: 36.6 C  Resp: 16    Complications: No apparent anesthesia complications

## 2015-10-06 NOTE — Anesthesia Preprocedure Evaluation (Addendum)
Anesthesia Evaluation  Patient identified by MRN, date of birth, ID band Patient awake    Reviewed: Allergy & Precautions, H&P , NPO status , Patient's Chart, lab work & pertinent test results, reviewed documented beta blocker date and time   Airway Mallampati: II  TM Distance: >3 FB Neck ROM: full    Dental  (+) Dental Advisory Given, Missing Several missing front teeth upper:   Pulmonary neg pulmonary ROS,    Pulmonary exam normal breath sounds clear to auscultation       Cardiovascular Exercise Tolerance: Good hypertension, Pt. on medications and Pt. on home beta blockers Normal cardiovascular exam+ dysrhythmias Atrial Fibrillation  Rhythm:regular Rate:Normal  ECG - marked sinus bradycardia   Neuro/Psych negative neurological ROS  negative psych ROS   GI/Hepatic negative GI ROS, Neg liver ROS,   Endo/Other  negative endocrine ROSprediabetes  Renal/GU negative Renal ROS  negative genitourinary   Musculoskeletal   Abdominal   Peds  Hematology negative hematology ROS (+)   Anesthesia Other Findings   Reproductive/Obstetrics negative OB ROS                            Anesthesia Physical Anesthesia Plan  ASA: III  Anesthesia Plan: General   Post-op Pain Management:    Induction: Intravenous  Airway Management Planned: Oral ETT  Additional Equipment:   Intra-op Plan:   Post-operative Plan: Extubation in OR  Informed Consent: I have reviewed the patients History and Physical, chart, labs and discussed the procedure including the risks, benefits and alternatives for the proposed anesthesia with the patient or authorized representative who has indicated his/her understanding and acceptance.   Dental Advisory Given  Plan Discussed with: CRNA and Surgeon  Anesthesia Plan Comments:         Anesthesia Quick Evaluation

## 2015-10-06 NOTE — Progress Notes (Signed)
Patient stated allergy to penicillin as child. Has Ancef ordered pre-op. Spoke to [employer] and he looked back in her records and she received Keflex this year with no problems so is fine for her to receive Ancef today.   Also, patient states has right ear infection and was treated with Zpack ending last week. No pain. No fever. Daughter states she can see white drainage from right ear at times. Patient to follow up with medical MD regarding this after discharge.

## 2015-10-06 NOTE — H&P (Signed)
[name] is a 59 year old female with significant right renal calculi.  History of Present Illness            Nephrolithiasis: She she was found in 8/06 to have bilateral renal calculi by CT scan.  11/06 - left renal ESL  12/06 - repeat left renal ESL  1/07 - left percutaneous nephrolithotomy  Metabolic evaluation 1/15: Serum studies - normal, 24-hour urine - hypocitraturia and low volume (1500 cc)  Treatment: Potassium citrate 20 mEq b.i.d.    Recurrent UTIs: She has minimal symptoms but does experience some dysuria. She has not experienced flank pain or fever to suggest upper tract involvement.  Treatment: Nitrofurantoin nightly prophylaxis.    Microscopic hematuria. In 7/16 she was found to have 7-10 rbc/hpf with a negative culture and no prior history of gross hematuria.    Interval history: She has been doing well with no flank pain or dysuria.              Past Medical History Problems  1. History of Eczema 2. History of hypercholesterolemia (Z86.39) 3. History of hypertension (Z86.79)  Surgical History Problems  1. History of Percutaneous Lithotomy 2. History of Renal Lithotripsy 3. History of Renal Lithotripsy  Current Meds 1. Atenolol 50 MG Oral Tablet;  Therapy: (Recorded:11Sep2014) to Recorded 2. Doxepin HCl - 75 MG Oral Capsule;  Therapy: (Recorded:11Sep2014) to Recorded 3. Iron 325 (65 Fe) MG Oral Tablet;  Therapy: (Recorded:11Sep2014) to Recorded 4. Klor-Con M20 20 MEQ Oral Tablet Extended Release;  Therapy: [id number] to Recorded 5. Multi Vitamin/Minerals TABS;  Therapy: (Recorded:11Sep2014) to Recorded 6. Nitrofurantoin Macrocrystal 100 MG Oral Capsule; TAKE 1 CAPSULE qHS;  Therapy: [id number] to (Evaluate:01Oct2015)  Requested for: 06Oct2014; Last  Rx:06Oct2014 Ordered 7. Taztia XT 180 MG Oral Capsule Extended Release 24 Hour;  Therapy: (Recorded:11Sep2014) to Recorded 8. Triamterene-HCTZ 75-50 MG Oral Tablet;  Therapy:  [id number] to Recorded 9. Vitamin D3 1000 UNIT Oral Capsule;  Therapy: (Recorded:11Sep2014) to Recorded 10. Vytorin 10-40 MG Oral Tablet;   Therapy: (Recorded:11Sep2014) to Recorded 11. Xarelto 20 MG Oral Tablet;   Therapy: (Recorded:21Oct2016) to Recorded  Allergies Medication  1. Penicillins  Family History Problems  1. Family history of Alzheimer Disease : Father 2. Family history of Breast Cancer : Maternal Aunt 3. Family history of Death In The Family Father 4. Family history of Family Health Status Children ___ Living Daughters   2 5. Family history of Prostate Cancer : Paternal Uncle  Social History Problems  1. Denied: History of Alcohol Use (History) 2. Caffeine Use   occasional use 3. Marital History - Widowed 4. Never A Smoker 5. Occupation:   SAFETY ASSISTANT ON BUS    Vitals Vital Signs  Blood Pressure: 155 / 75 Heart Rate: 52 Recorded: 21Oct2016 11:40AM  Height: 5 ft 1 in Weight: 206 lb  BMI Calculated: 38.92 BSA Calculated: 1.91  Review of Systems Genitourinary, constitutional, skin, eye, otolaryngeal, hematologic/lymphatic, cardiovascular, pulmonary, endocrine, musculoskeletal, gastrointestinal, neurological and psychiatric system(s) were reviewed and pertinent findings if present are noted.  Integumentary: pruritus.  Neurological: headache.   Physical Exam Constitutional: Well nourished and well developed . No acute distress.  ENT:. The ears and nose are normal in appearance.  Neck: The appearance of the neck is normal and no neck mass is present.  Pulmonary: No respiratory distress and normal respiratory rhythm and effort.  Cardiovascular: Heart rate and rhythm are normal . No peripheral edema.  Abdomen: The abdomen is soft and nontender. No masses are palpated. No  CVA tenderness. No hernias are palpable. No hepatosplenomegaly noted.  Lymphatics: The femoral and inguinal nodes are not enlarged or tender.  Skin: Normal skin turgor, no visible  rash and no visible skin lesions.  Neuro/Psych:. Mood and affect are appropriate.   Results/Data   Old records or history reviewed:.  CT scan is below.  The following clinical lab reports were reviewed:  UA:. Culture done on 04/30/15 was negative.  The following radiology reports were reviewed: CT scan. Selected Results  AU CT-HEMATURIA PROTOCOL [id number] 12:00AM Kathie Rhodes  Test Name Result Flag Reference AU CT-HEMATURIA PROTOCOL (Report)   ** RADIOLOGY REPORT BY [facility] **   CLINICAL DATA: Recurrent cystitis.  EXAM: CT ABDOMEN AND PELVIS WITHOUT AND WITH CONTRAST  TECHNIQUE: Multidetector CT imaging of the abdomen and pelvis was performed following the standard protocol before and following the bolus administration of intravenous contrast.  CONTRAST: 125 cc Isovue  COMPARISON: None.  FINDINGS: Lower chest: Mild ground-glass opacities at the lung bases suggesting atelectasis or mild edema.  Hepatobiliary: Simple cyst in LEFT hepatic lobe. No biliary duct dilatation. Gallbladder normal.  Pancreas: Pancreas is normal. No ductal dilatation. No pancreatic inflammation.  Spleen: Normal spleen  Adrenals/urinary tract: Adrenal glands are normal.  Non IV contrast images demonstrate a lobular calcification in the RIGHT upper pole measuring 16 mm in the upper pole (image 69). There is mild pelvic caliectasis and cortical scarring peripheral to this calculus. No RIGHT ureteral lithiasis.  There are several calcifications in the LEFT lower pole and collecting system, the largest calculus measures 16 mm in coronal dimension on image 68. There are 2 additional 4 mm. There is cortical thinning in lower pole with pelvicaliectasis proximal to the calculus. No LEFT ureterolithiasis.  No enhancing renal cortical lesion. Delayed cortical phase imaging demonstrates no filling defects within the collecting systems. There is mild pelvicaliectasis proximal to the  calculus in the upper pole of the RIGHT kidney and lower pole of LEFT kidney.  No bladder calculi, enhancing bladder lesions, or filling defect within the bladder.  Stomach/Bowel: Stomach, small bowel, appendix, and cecum are normal. The colon and rectosigmoid colon are normal.  Vascular/Lymphatic: Abdominal aorta is normal caliber. There is no retroperitoneal or periportal lymphadenopathy. No pelvic lymphadenopathy.  Reproductive: Uterus and ovaries are normal.  Musculoskeletal: No aggressive osseous lesion.  Other: No free fluid.  IMPRESSION: 1. Locally partially obstructing calculus in the upper pole of the RIGHT kidney with renal cortical thinning and pelvicaliectasis. 2. Locally partially obstructing calculus in lower pole LEFT kidney with renal cortical thinning and pelvicaliectasis. 3. No ureterolithiasis or obstructive uropathy. 4. No bladder stones or filling defects in the bladder which does not excluded a bladder lesion.   Electronically Signed  By: Suzy Bouchard M.D.  On: 05/23/2015 12:01  BUN & CREATININE [id number] 04:04PM Kathie Rhodes SPECIMEN TYPE: BLOOD  Test Name Result Flag Reference CREATININE 0.80 mg/dL  0.50-1.40 BUN 14 mg/dL  7-25 Est GFR, African American >89 mL/min  >=60 Est GFR, NonAfrican American 80 mL/min  >=60 THE ESTIMATED GFR IS A CALCULATION VALID FOR ADULTS (>=20 YEARS OLD) THAT USES THE CKD-EPI ALGORITHM TO ADJUST FOR AGE AND SEX. IT IS   NOT TO BE USED FOR CHILDREN, PREGNANT WOMEN, HOSPITALIZED PATIENTS,    PATIENTS ON DIALYSIS, OR WITH RAPIDLY CHANGING KIDNEY FUNCTION. ACCORDING TO THE NKDEP, EGFR >89 IS NORMAL, 60-89 SHOWS MILD IMPAIRMENT, 30-59 SHOWS MODERATE IMPAIRMENT, 15-29 SHOWS SEVERE IMPAIRMENT AND <15 IS ESRD.     FOOTNOTES:  (  1) ** PLEASE NOTE CHANGE IN UNIT OF MEASURE AND REFERENCE RANGE(S). **  Procedure  Procedure: Cystoscopy done on 08/08/15 Chaperone Present: Sonia.  Indication: Hematuria.  Informed  Consent: Risks, benefits, and potential adverse events were discussed and informed consent was obtained from the patient.  Prep: The patient was prepped with hibiclens.  Procedure Note:  Urethral meatus:. No abnormalities.  Anterior urethra: No abnormalities.  Bladder: Visulization was clear. The ureteral orifices were in the normal anatomic position bilaterally and had clear efflux of urine. A systematic survey of the bladder demonstrated no bladder tumors or stones. The mucosa was smooth without abnormalities. The patient tolerated the procedure well.  Complications: None.    Assessment  I went over the results of her workup which has revealed a normal serum creatinine of 0.8. Her CT scan has revealed renal calculi bilaterally with a 14 mm stone located within and infundibulum of the upper pole causing some slight obstruction but no loss of renal parenchyma and a 16 mm stone in the infundibulum of a lower pole with 2 adjacent 4 mm stones and significant loss of overlying parenchyma indicating long-standing obstruction. The right renal calculus had Hounsfield units of 1600.  I evaluated the bladder cystoscopically and found no abnormality and therefore her hematuria is undoubtedly secondary to her stones.    We discussed the management of urinary stones. These options include observation, ureteroscopy, shockwave lithotripsy, and PCNL. We discussed which options are relevant to these particular stones. We discussed the natural history of stones as well as the complications of untreated stones and the impact on quality of life without treatment as well as with each of the above listed treatments. We also discussed the efficacy of each treatment in its ability to clear the stone burden. With any of these management options I discussed the signs and symptoms of infection and the need for emergent treatment should these be experienced. For each option we discussed the ability of each procedure to clear  the patient of their stone burden.    For observation I described the risks which include but are not limited to silent renal damage, life-threatening infection, need for emergent surgery, failure to pass stone, and pain.    For ureteroscopy I described the risks which include heart attack, stroke, pulmonary embolus, death, bleeding, infection, damage to contiguous structures, positioning injury, ureteral stricture, ureteral avulsion, ureteral injury, need for ureteral stent, inability to perform ureteroscopy, need for an interval procedure, inability to clear stone burden, stent discomfort and pain.    For shockwave lithotripsy I described the risks which include arrhythmia, kidney contusion, kidney hemorrhage, need for transfusion, long-term risk of diabetes or hypertension, back discomfort, flank ecchymosis, flank abrasion, inability to break up stone, inability to pass stone fragments, Steinstrasse, infection associated with obstructing stones, need for different surgical procedure and possible need for repeat shockwave lithotripsy.    For PCNL I described the risks including heart attack, sure, pulmonary embolus, death, positioning injury, pneumothorax, hydrothorax, need for chest tube, inability to clear stone burden, renal laceration, arterial venous fistula or malformation, need for embolization of kidney, loss of kidney or renal function, need for repeat procedure, need for prolonged nephrostomy tube, ureteral avulsion and fistula.    I told her that as far as her stones go they are asymptomatic and clearly the stones in the lower pole of the left kidney have been present and causing some degree of obstruction for quite some time as there is marked loss of overlying parenchyma.  The probability of the stones passing spontaneously is exceedingly low. The stone in the upper pole of the right kidney however does not have any significant associated loss of parenchyma and therefore treatment  should be considered. It has high Hounsfield units and a skin to stone distance of 12 cm. This would indicate lithotripsy has a lower probability of success than ureteroscopy or a percutaneous nephrolithotomy. She has undergone previous lithotripsy and PCNL. After discussing this with her and her daughter she has elected to proceed with a PCNL. I went over the procedure with her in detail including the access to the kidney, the risks and complications, the probability of success, the need for an overnight stay in the hospital, the possible need for a second procedure and the anticipated postoperative course. She understands and has elected to proceed.    Plan    1. Her urine was cultured in preparation for surgery and was negative.  2. She will be scheduled for right percutaneous nephrolithotomy.

## 2015-10-06 NOTE — Anesthesia Procedure Notes (Signed)
Procedure Name: Intubation Date/Time: 10/06/2015 12:25 PM Performed by: Ofilia Neas Pre-anesthesia Checklist: Patient identified, Timeout performed, Emergency Drugs available, Suction available and Patient being monitored Patient Re-evaluated:Patient Re-evaluated prior to inductionOxygen Delivery Method: Circle system utilized Preoxygenation: Pre-oxygenation with 100% oxygen Intubation Type: IV induction Ventilation: Mask ventilation without difficulty Laryngoscope Size: Mac and 3 Grade View: Grade II Tube type: Oral Tube size: 7.5 mm Number of attempts: 1 Airway Equipment and Method: Stylet Placement Confirmation: ETT inserted through vocal cords under direct vision,  breath sounds checked- equal and bilateral and positive ETCO2 Secured at: 21 cm Tube secured with: Tape Dental Injury: Teeth and Oropharynx as per pre-operative assessment

## 2015-10-06 NOTE — Op Note (Signed)
PATIENT:  [name]  PRE-OPERATIVE DIAGNOSIS: Right renal calculus  POST-OPERATIVE DIAGNOSIS: Same  PROCEDURE: 1. Percutaneous nephrostomy sheath placement. 2.  Right percutaneous nephrolithotomy (1.5 cm.) 3. Antegrade double-J stent placement.  SURGEON:  Claybon Jabs  INDICATION: [name] is a 59 year old female with a large stone in an upper pole infundibulum. We discussed the treatment options and she is elected to proceed with percutaneous nephrolithotomy.  ANESTHESIA:  General  EBL:  Minimal  DRAINS: 4.8 French, 24 cm double-J stent in the right ureter  LOCAL MEDICATIONS USED:  None  SPECIMEN:  Stone taken to my office for composition analysis  Description of procedure: After informed consent the patient was taken to the operating room and administered general endotracheal anesthesia. Once fully anesthetized the patient had an 57 French Foley catheter placed she was then moved from the stretcher onto the operating room table in a prone position with bony prominences padded and chest pads in place. The flank with exiting nephrostomy catheters was then sterilely prepped and draped in standard fashion. An official timeout was then performed.  Using the inferior existing nephrostomy catheter access I passed a 0.038 inch floppy tipped guidewire down the ureter into the bladder under fluoroscopy.  This was left in place and the nephrostomy catheter was removed.  A transverse incision was made over the guidewire and a peel-away coaxial catheter was then passed over the guidewire and down the ureter under fluoroscopy.  The inner portion of the coaxial system was then removed and a second guidewire was passed through this catheter and down the ureter into the bladder under fluoroscopy.  The coaxial catheter was then removed and one of the guidewires was secured to the drape as a safety guidewire and the second guidewire was used as a working guidewire.  The NephroMax  nephrostomy dilating balloon was then passed over the working guidewire into the area of the renal pelvis under fluoroscopy.  It was then inflated using dilute contrast under fluoroscopy until the balloon was fully inflated.  I then passed the 28 French nephrostomy access sheath over the balloon into the area of the renal pelvis under fluoroscopy and then deflated the balloon and removed the dilating balloon.  The 43 French rigid nephroscope was then passed under direct vision through the nephrostomy access sheath.  Clotted blood was evacuated and the stone was identified by following the nephrostomy catheter that had been placed through the upper pole infundibulum by the interventional radiologist. This caused some disruption of the upper pole calyx and resulted in the stone being located outside of the collecting system. It was in the fatty tissue and I was able to identify the stone and grasped it with 3 prong graspers. The stone broke and I was able to remove a portion and then was able to go back and find a very large he switch I was able to grab and extract. Further inspection with the rigid nephroscope revealed no further stone could be identified. Fluoroscopically no stone was visible and I switched to the flexible cystoscope and again inspected each calyx and primarily the upper pole. I was able to pass it into the upper pole indicating that the stone previously located in the infundibulum was no longer present.  As there was minimal bleeding during the case I elected to perform this without a nephrostomy tube exiting the flank. I therefore removed the nephroureteral catheter that was in the upper pole. I then backloaded the nephroscope over the guidewire and passed  the double-J stent over the guidewire into the bladder. Good curl was noted in the bladder as I removed the guidewire. I then positioned the stent in the renal pelvis. I measured the distance into the renal pelvis and used this to then pass  the laparoscopic FloSeal introducer through the nephrostomy sheath and injected FloSeal at the level of the renal parenchyma and through the nephrostomy tract as I withdrew the nephrostomy sheath and laparoscopic introducer. I then closed the skin with running 4-0 subcuticular Monocryl and a Tegaderm dressing with 4 x 4's was then applied. The patient was awakened and taken to the recovery room in stable and satisfactory condition. She tolerated the procedure well no intraoperative complications.    PLAN OF CARE: Discharge to home after an overnight stay.  PATIENT DISPOSITION:  PACU - hemodynamically stable.

## 2015-10-06 NOTE — Anesthesia Postprocedure Evaluation (Signed)
Anesthesia Post Note  Patient: Aelish Micheletti Blayney  Procedure(s) Performed: Procedure(s) (LRB): NEPHROLITHOTOMY PERCUTANEOUS (Right) CYSTOSCOPY WITH HOLMIUM LASER LITHOTRIPSY (Right)  Patient location during evaluation: PACU Anesthesia Type: General Level of consciousness: awake and alert Pain management: pain level controlled Vital Signs Assessment: post-procedure vital signs reviewed and stable Respiratory status: spontaneous breathing, nonlabored ventilation, respiratory function stable and patient connected to nasal cannula oxygen Cardiovascular status: blood pressure returned to baseline and stable Postop Assessment: no signs of nausea or vomiting Anesthetic complications: no    Last Vitals:  Filed Vitals:   10/06/15 1515 10/06/15 1530  BP: 137/73 133/74  Pulse: 75 77  Temp:  36.4 C  Resp: 15 19    Last Pain:  Filed Vitals:   10/06/15 1534  PainSc: [place] Jaquelyn Sakamoto

## 2015-10-06 NOTE — Progress Notes (Signed)
Patient ID: [name], female   DOB: Dec 29, 1955, 59 y.o.   MRN: [id number] Night of surgery note  Subjective: The patient is doing well.  No complaints other than feeling weak from her surgery. She was not complaining of any flank pain.  Objective: Vital signs in last 24 hours: Temp:  [97.4 F (36.3 C)-97.8 F (36.6 C)] 97.4 F (36.3 C) (12/19 1549) Pulse Rate:  [47-82] 79 (12/19 1549) Resp:  [13-46] 14 (12/19 1549) BP: (114-159)/(63-99) 136/65 mmHg (12/19 1549) SpO2:  [98 %-100 %] 100 % (12/19 1549) Weight:  [83.915 kg (185 lb)] 83.915 kg (185 lb) (12/19 0749)  Intake/Output this shift: Total I/O In: 1700 [I.V.:1700] Out: 400 [Urine:400]  Physical Exam:  General: Alert and oriented but slightly groggy. Abdomen: Soft, Nondistended. Flank: Clean with dry, intact dressing.  Lab Results:  Recent Labs  10/06/15 0820  HGB 13.9  HCT 42.5    Assessment/Plan: 1) Continue to monitor 2) Per orders 3) Anticipate discharge in a.m.   [name] C. Karsten Ro, [place] C 10/06/2015, 4:47 PM

## 2015-10-06 NOTE — H&P (Signed)
Chief Complaint: Patient was seen in consultation today for right renal calculus at the request of [facility]  Referring Physician(s): Ottelin,Mark  History of Present Illness: [name] is a 59 y.o. female with bilateral renal calculus, history of left PCNL who has been seen by Dr. Karsten Ro and scheduled today for image guided right percutaneous nephrostomy tube placement with PCNL to follow. She denies any chest pain, shortness of breath or palpitations. She denies any active signs of bleeding or excessive bruising. She denies any recent fever or chills. The patient denies any history of sleep apnea or chronic oxygen use. She has no known complications to sedation.    Past Medical History  Diagnosis Date  . MRSA (methicillin resistant staph aureus) culture positive   . Eczema   . Hypercholesteremia   . Hypertension   . Kidney stones   . Anemia   . Vitamin D deficiency   . Paroxysmal atrial fibrillation ([place]) 10/2011, 06/2015  . GERD (gastroesophageal reflux disease)   . Atrial fibrillation ([place]) 2016  . Dysrhythmia     A FIB - followed by Dr. Meda Coffee    Past Surgical History  Procedure Laterality Date  . Breast surgery Left     biopsy-neg  . Kidney stone surgery  2013    Allergies: Penicillins  Medications: Prior to Admission medications   Medication Sig Start Date End Date Taking? Authorizing Provider  ascorbic acid (VITAMIN C) 1000 MG tablet Take 1,000 mg by mouth daily.    Historical Provider, MD  aspirin EC 81 MG tablet Take 1 tablet (81 mg total) by mouth daily. 08/18/15   Dorothy Spark, MD  atenolol (TENORMIN) 100 MG tablet Take 100 mg by mouth every morning.     Historical Provider, MD  atorvastatin (LIPITOR) 80 MG tablet Take 26.67 mg by mouth See admin instructions. Take 1/3 tablet by mouth three times a week on Tuesday, thursday, and Saturday    Historical Provider, MD  cholecalciferol (VITAMIN D) 1000 UNITS tablet Take 4,000 Units by mouth  daily.    Historical Provider, MD  diltiazem (CARDIZEM CD) 360 MG 24 hr capsule Take 1 capsule (360 mg total) by mouth daily. 07/10/15   Bhavinkumar Bhagat, PA  doxepin (SINEQUAN) 50 MG capsule Take 1 capsule (50 mg total) by mouth at bedtime. 09/18/15   Courtney Forcucci, PA-C  ferrous sulfate 325 (65 FE) MG tablet Take 325 mg by mouth daily with breakfast.    Historical Provider, MD  ofloxacin (FLOXIN) 0.3 % otic solution Place 10 drops into the right ear daily. 09/18/15   Courtney Forcucci, PA-C  PAZEO 0.7 % SOLN Place 1 drop into both eyes daily. 07/04/15   Historical Provider, MD  potassium chloride SA (K-DUR,KLOR-CON) 20 MEQ tablet Take 1 tablet (20 mEq total) by mouth 2 (two) times daily. Patient taking differently: Take 40 mEq by mouth daily.  02/13/15   Dorothy Spark, MD  traMADol (ULTRAM) 50 MG tablet Take 1 tablet (50 mg total) by mouth every 6 (six) hours as needed. Patient not taking: Reported on 09/26/2015 08/03/15   Julianne Rice, MD  triamcinolone cream (KENALOG) 0.1 % Apply 1 application topically as directed. Patient taking differently: Apply 1 application topically 2 (two) times daily. Apply to affected area for eczema 07/25/15   Unk Pinto, MD  triamterene-hydrochlorothiazide (MAXZIDE-25) 37.5-25 MG per tablet Take 1 tablet by mouth daily. 02/13/15   Dorothy Spark, MD     Family History  Problem Relation Age  of Onset  . Hypertension Mother   . Hepatitis C Mother   . Heart disease Father   . Dementia Father   . Aneurysm Brother     Social History   Social History  . Marital Status: Widowed    Spouse Name: N/A  . Number of Children: N/A  . Years of Education: N/A   Social History Main Topics  . Smoking status: Never Smoker   . Smokeless tobacco: Never Used  . Alcohol Use: No  . Drug Use: No  . Sexual Activity: Not Asked   Other Topics Concern  . None   Social History Narrative   Review of Systems: A 12 point ROS discussed and pertinent positives  are indicated in the HPI above.  All other systems are negative.  Review of Systems  Vital Signs: T: 97.8 F, HR: 53 bpm, BP: 142/73 mmHg, O2: 100% RA  Physical Exam  Constitutional: She is oriented to person, place, and time. No distress.  HENT:  Head: Normocephalic and atraumatic.  Cardiovascular: Normal rate and regular rhythm.  Exam reveals no gallop and no friction rub.   No murmur heard. Pulmonary/Chest: Effort normal and breath sounds normal. No respiratory distress. She has no wheezes. She has no rales.  Abdominal: Soft. Bowel sounds are normal. She exhibits no distension. There is no tenderness.  Neurological: She is alert and oriented to person, place, and time.  Skin: Skin is warm and dry. She is not diaphoretic.    Mallampati Score:  MD Evaluation Airway: WNL Heart: WNL Abdomen: WNL Chest/ Lungs: WNL ASA  Classification: 2 Mallampati/Airway Score: Two  Imaging: No results found.  Labs:  CBC:  Recent Labs  07/29/15 1657 08/03/15 0356 09/29/15 1130 10/06/15 0820  WBC 5.7 6.1 5.9 4.4  HGB 13.8 13.9 13.0 13.9  HCT 41.1 42.0 40.2 42.5  PLT 353 291 281 241    COAGS:  Recent Labs  09/29/15 1130 10/06/15 0820  INR 1.02 0.90    BMP:  Recent Labs  07/10/15 0418 07/24/15 1536 07/29/15 1657 08/03/15 0356 09/29/15 1130  NA 134* 143 141 140 139  K 4.4 4.2 4.2 3.7 4.0  CL 100* 106 102 101 106  CO2 27 16* 27 26 27   GLUCOSE 85 46* 75 98 82  BUN 5* 8 14 14 11   CALCIUM 9.4 9.6 9.6 10.1 9.4  CREATININE 0.64 0.78 0.71 0.91 0.78  GFRNONAA >60  --  >89 >60 >60  GFRAA >60  --  >89 >60 >60    LIVER FUNCTION TESTS:  Recent Labs  07/07/15 1118 07/08/15 1156 07/29/15 1657 08/03/15 0356  BILITOT 0.7 1.0 0.3 0.6  AST 16 22 14 20   ALT 22 21 11 15   ALKPHOS 99 98 82 88  PROT 7.3 7.7 6.6 6.7  ALBUMIN 4.6 4.3 4.1 3.9    Assessment and Plan: Bilateral renal calculus History of left PCNL Seen by Dr. Karsten Ro  Scheduled today for image guided  right percutaneous nephrostomy tube placement with PCNL to follow The patient has been NPO, no blood thinners taken, labs and vitals have been reviewed. Risks and Benefits discussed with the patient including, but not limited to infection, bleeding, significant bleeding causing loss or decrease in renal function or damage to adjacent structures.  All of the patient's questions were answered, patient is agreeable to proceed. Consent signed and in chart. PAfib on ASA only    Thank you for this interesting consult.  I greatly enjoyed meeting [name] and  look forward to participating in their care.  A copy of this report was sent to the requesting provider on this date.  SignedHedy Jacob 10/06/2015, 9:10 AM   I spent a total of 15 Minutes in face to face in clinical consultation, greater than 50% of which was counseling/coordinating care for right renal calculus

## 2015-10-06 NOTE — Procedures (Signed)
Post-Procedure Note  Pre-operative Diagnosis: Right renal calculus       Post-operative Diagnosis: Same   Indications: Scheduled for percutaneous nephrolithotomy  Procedure Details:  Placement of two right sided nephroureteral catheters with ultrasound and fluoroscopic guidance.    Findings: Small staghorn calculus in right mid/upper pole.  Mild hydronephrosis in upper pole.  Access from a posterior upper pole calyx.  Unable to advance a catheter from the obstructed calyx in mid/upper region.  Second access obtained from lower pole.   Complications: None     Condition: Stable  Plan: To OR for percutaneous nephrolithotomy with Dr. Karsten Ro.

## 2015-10-06 NOTE — Discharge Instructions (Signed)

## 2015-10-07 ENCOUNTER — Encounter (HOSPITAL_COMMUNITY): Payer: Self-pay | Admitting: Urology

## 2015-10-07 DIAGNOSIS — N132 Hydronephrosis with renal and ureteral calculous obstruction: Secondary | ICD-10-CM | POA: Diagnosis not present

## 2015-10-07 MED ORDER — TRAMADOL HCL 50 MG PO TABS: 50.0000 mg | ORAL_TABLET | Freq: Four times a day (QID) | ORAL | Status: DC | PRN

## 2015-10-07 NOTE — Discharge Summary (Signed)
Physician Discharge Summary  Patient ID: [name] MRN: [id number] DOB/AGE: 01/10/1956 59 y.o.  Admit date: 10-14-15 Discharge date: 10/07/2015  Admission Diagnoses: RIGHT RENAL CALCULUS  Discharge Diagnoses:  Active Problems:   Renal calculus, right   Discharged Condition: good  Hospital Course: She had a large stone in an upper pole infundibulum and was electively brought in to the hospital for a percutaneous nephrolithotomy.  She underwent her right PCNL without complication or the need for a nephrostomy tube.  A stent was left indwelling.  The stone was completely removed visually and this was confirmed by follow-up CT scan which revealed no residual renal calculi.  She did well overnight with no complications.  She is tolerating a regular diet, having no pain and was felt ready for discharge at this time.  Significant Diagnostic Studies: Ct Abdomen Wo Contrast  2015/10/14  CLINICAL DATA:  Status post prior nephrostomy with stent placement, evaluate right renal calculi EXAM: CT ABDOMEN WITHOUT CONTRAST TECHNIQUE: Multidetector CT imaging of the abdomen was performed following the standard protocol without IV contrast. COMPARISON:  CTA abdomen pelvis dated 07/08/2015 FINDINGS: Lower chest: Mild patchy bilateral lower lobe opacities, likely atelectasis. Trace right pleural fluid. Hepatobiliary: Unenhanced liver is unremarkable. Gallbladder is unremarkable. No intrahepatic or extrahepatic ductal dilatation. Pancreas: Within normal limits. Spleen: Within normal limits. Adrenals/Urinary Tract: Adrenal glands are within normal limits. Right kidney is unremarkable. Mild right hydronephrosis with contrast layering posteriorly in the right renal collecting system and scattered foci of nondependent gas. Indwelling right ureteral stent in satisfactory position. No residual right renal or proximal ureteral calculi. Left kidney is notable for cortical scarring in the left lower pole. Multiple  left renal calculi measuring up to 8 mm in the left lower pole (series 2/ image 35). No hydronephrosis. Stomach/Bowel: Stomach is within normal limits. Visualized bowel is unremarkable. Vascular/Lymphatic: Atherosclerotic calcifications of the abdominal aorta. No evidence of abdominal aortic aneurysm. No suspicious abdominal lymphadenopathy. Other: Nonspecific stranding/ hemorrhage with gas along the right posterior perirenal space (series 2/ image 36), reflecting postprocedural changes. Musculoskeletal: Degenerative changes of the visualized thoracolumbar spine. IMPRESSION: Indwelling right ureteral stent with mild right hydronephrosis. No residual right renal or proximal ureteral calculi. Mild stranding/hemorrhage with gas along the right posterior perirenal space, reflecting postprocedural changes. Multiple left renal calculi measuring up to 8 mm in the left lower pole. No hydronephrosis. Please note that the pelvis was not imaged. Electronically Signed   By: Julian Hy M.D.   On: 2015-10-14 18:31   Dg C-arm 1-60 Min-no Report  October 14, 2015  CLINICAL DATA: surgery C-ARM 1-60 MINUTES Fluoroscopy was utilized by the requesting physician.  No radiographic interpretation.   Ir Ureteral Stent Placement Existing Access Right  Oct 14, 2015  CLINICAL DATA:  59 year-old with right renal calculus and scheduled for percutaneous nephrolithotomy. Patient needs percutaneous access for the nephrolithotomy procedure. EXAM: PLACEMENT OF RIGHT NEPHROURETERAL CATHETER X 2 WITH ULTRASOUND AND FLUOROSCOPIC GUIDANCE Physician: Stephan Minister. Henn, MD FLUOROSCOPY TIME:  30 minutes and 54 seconds. 958 mGy MEDICATIONS: Ancef 2 g. 7 mg Versed, 125 mcg fentanyl. A radiology nurse monitored the patient for moderate sedation. ANESTHESIA/SEDATION: Moderate sedation time: 1 hour and 35 minutes PROCEDURE: The procedure was explained to the patient. The risks and benefits of the procedure were discussed and the patient's questions were  addressed. Informed consent was obtained from the patient. The right flank was marked for this procedure. Patient was placed prone on the interventional table. The right flank was prepped and draped  in sterile fashion. Maximal barrier sterile technique was utilized including caps, mask, sterile gowns, sterile gloves, sterile drape, hand hygiene and skin antiseptic. The right renal calculus was identified with fluoroscopy. Right kidney was evaluated with ultrasound. Mildly dilated upper pole calyx was targeted with ultrasound. A 21 gauge needle was directed into an upper calyx with ultrasound guidance and contrast was used to opacified the renal collecting system. Collecting system was also opacified with gas. An Accustick dilator set was placed. Calyx associated with the upper pole stone was targeted with fluoroscopy. Wire was advanced into this calyx but could not be successfully advanced beyond the infundibulum stone. Catheter and wire repeatedly went outside of the collecting system. Therefore, the upper pole access was upsized to a 5 [place] catheter. A C2 catheter was used to cannulate the lower pole. Additional contrast was injected. A lower pole calyx was then targeted. A 22 gauge needle was directed into a lower pole calyx with fluoroscopic guidance. Eventually, a wire was advanced into the collecting system and the tract was dilated to accommodate an Accustick dilator set. A 4 French Glide catheter was then advanced through this catheter and advanced down the ureter with a Glidewire. The other 5 French catheter was advanced down the ureter and positioned of the urinary bladder. Both catheters sutured to the skin and capped. Patient was transferred to the PACU. Fluoroscopic and ultrasound images were taken and saved for documentation. Estimated blood loss: Minimal FINDINGS: There is a stone occupying an upper pole infundibulum and part of a calyx. Mild dilatation of the upper pole calices due to partial  obstruction from this stone. Access was obtained from a posterior upper pole calyx and a lower pole calyx. Unable to gain access from the calyx associated from the infundibulum stone. COMPLICATIONS: None IMPRESSION: Successful placement of two right nephroureteral catheters with ultrasound and fluoroscopic guidance. One catheter was placed in an upper pole access. The other catheter was placed in a lower pole access. Electronically Signed   By: Markus Daft M.D.   On: 10/06/2015 18:37    Discharge Exam: Blood pressure 140/75, pulse 78, temperature 97.4 F (36.3 C), temperature source Oral, resp. rate 16, height 5\' 2"  (1.575 m), weight 83.915 kg (185 lb), SpO2 100 %.   Disposition: 01-Home or Self Care     Medication List    ASK your doctor about these medications        ascorbic acid 1000 MG tablet  Commonly known as:  VITAMIN C  Take 1,000 mg by mouth daily.     aspirin EC 81 MG tablet  Take 1 tablet (81 mg total) by mouth daily.     atenolol 100 MG tablet  Commonly known as:  TENORMIN  Take 100 mg by mouth every morning.     atorvastatin 80 MG tablet  Commonly known as:  LIPITOR  Take 26.67 mg by mouth See admin instructions. Take 1/3 tablet by mouth three times a week on Tuesday, thursday, and Saturday     cholecalciferol 1000 UNITS tablet  Commonly known as:  VITAMIN D  Take 4,000 Units by mouth daily.     diltiazem 360 MG 24 hr capsule  Commonly known as:  CARDIZEM CD  Take 1 capsule (360 mg total) by mouth daily.     doxepin 50 MG capsule  Commonly known as:  SINEQUAN  Take 1 capsule (50 mg total) by mouth at bedtime.     ferrous sulfate 325 (65 FE) MG tablet  Take  325 mg by mouth daily with breakfast.     ofloxacin 0.3 % otic solution  Commonly known as:  FLOXIN  Place 10 drops into the right ear daily.     PAZEO 0.7 % Soln  Generic drug:  Olopatadine HCl  Place 1 drop into both eyes daily.     potassium chloride SA 20 MEQ tablet  Commonly known as:   K-DUR,KLOR-CON  Take 1 tablet (20 mEq total) by mouth 2 (two) times daily.     traMADol 50 MG tablet  Commonly known as:  ULTRAM  Take 1 tablet (50 mg total) by mouth every 6 (six) hours as needed.     triamcinolone cream 0.1 %  Commonly known as:  KENALOG  Apply 1 application topically as directed.     triamterene-hydrochlorothiazide 37.5-25 MG tablet  Commonly known as:  MAXZIDE-25  Take 1 tablet by mouth daily.           Follow-up Information    Follow up with Claybon Jabs, MD On 10/21/2015.   Specialty:  Urology   Why:  For your appiontment at 10:30   Contact information:   [address] [place] [address] [phone]       Signed: Claybon Jabs 10/07/2015, 4:13 AM

## 2015-10-08 ENCOUNTER — Encounter (HOSPITAL_COMMUNITY): Payer: Self-pay | Admitting: Urology

## 2015-10-08 ENCOUNTER — Ambulatory Visit: Payer: BC Managed Care – PPO | Admitting: Gastroenterology

## 2015-10-08 NOTE — Telephone Encounter (Signed)
I called yesterday to s/w pt to make sure that she was resuming her Xarelto 2 days after her procedure that was on 12/19. Daughter said pt was resting since she was just d/c 12/20 from hospital, though she will make sure pt is aware to resume Xarelto. I advised if pt has any questions feel free to call back. Daughter said thank you for my call.

## 2015-10-09 ENCOUNTER — Encounter: Payer: Self-pay | Admitting: Vascular Surgery

## 2015-10-09 ENCOUNTER — Ambulatory Visit (INDEPENDENT_AMBULATORY_CARE_PROVIDER_SITE_OTHER): Payer: BC Managed Care – PPO | Admitting: Vascular Surgery

## 2015-10-09 VITALS — BP 146/73 | HR 54 | Temp 98.5°F | Resp 20 | Ht 62.0 in | Wt 173.8 lb

## 2015-10-09 DIAGNOSIS — T8859XA Other complications of anesthesia, initial encounter: Secondary | ICD-10-CM

## 2015-10-09 DIAGNOSIS — R110 Nausea: Secondary | ICD-10-CM | POA: Insufficient documentation

## 2015-10-09 MED ORDER — PROMETHAZINE HCL 25 MG RE SUPP: 25.0000 mg | Freq: Four times a day (QID) | RECTAL | Status: DC | PRN

## 2015-10-09 NOTE — Progress Notes (Signed)
[facility] [place] HISTORY AND PHYSICAL    History of Present Illness:  Patient is a 59 year old female who presents for evaluation of chronic abdominal pain. The patient had been having abdominal pain for approximately one month. She was seen in October of this year. She was scheduled for a GI evaluation yesterday but missed his appointment due to procedures related to her renal stone. She states however that the abdominal pain symptoms that she had been having have completely resolved. She has a history of atrial fibrillation and is on Eliquis for this. This is currently being held due to her recent urologic procedure. She denies any prior abdominal operations. Other medical problems include elevated cholesterol, hypertension anemia and reflux. Are all currently stable. She is having some nausea today which is related to her recent urologic procedure.    Past Medical History   Diagnosis  Date   .  MRSA (methicillin resistant staph aureus) culture positive     .  Eczema     .  Hypercholesteremia     .  Hypertension     .  Kidney stones     .  Anemia     .  Vitamin D deficiency     .  Paroxysmal atrial fibrillation ([place])  10/2011, 06/2015   .  GERD (gastroesophageal reflux disease)     .  Atrial fibrillation ([place])  2016       Past Surgical History   Procedure  Laterality  Date   .  Breast surgery  Left         biopsy-neg   .  Kidney stone surgery         Social History Social History   Substance Use Topics   .  Smoking status:  Never Smoker    .  Smokeless tobacco:  Never Used   .  Alcohol Use:  No     Family History Family History   Problem  Relation  Age of Onset   .  Hypertension  Mother     .  Hepatitis C  Mother     .  Heart disease  Father     .  Dementia  Father     .  Aneurysm  Brother       Allergies    Allergies   Allergen  Reactions   .  Penicillins  Other (See Comments)       PT. DOESN'T REMEMBER        Current Outpatient  Prescriptions   Medication  Sig  Dispense  Refill   .  apixaban (ELIQUIS) 5 MG TABS tablet  Take 1 tablet (5 mg total) by mouth 2 (two) times daily.  60 tablet  11   .  ascorbic acid (VITAMIN C) 1000 MG tablet  Take 1,000 mg by mouth daily.       Marland Kitchen  atenolol (TENORMIN) 100 MG tablet  Take 100 mg by mouth every morning.        Marland Kitchen  atorvastatin (LIPITOR) 80 MG tablet  Take 26.67 mg by mouth as directed. Take 1/3 tablet by mouth three times a week on Tuesday, thursday, and saturday       .  cephALEXin (KEFLEX) 500 MG capsule  Take 1 capsule (500 mg total) by mouth 4 (four) times daily.  20 capsule  0   .  Cholecalciferol (VITAMIN D PO)  Take 4,000 Units by mouth daily.        Marland Kitchen  diltiazem (CARDIZEM CD) 360 MG 24 hr capsule  Take 1 capsule (360 mg total) by mouth daily.  30 capsule  11   .  doxepin (SINEQUAN) 25 MG capsule  Take 1 capsule (25 mg total) by mouth at bedtime.  30 capsule  5   .  Iron Combinations (IRON COMPLEX PO)  Take 1 tablet by mouth daily.       Marland Kitchen  PAZEO 0.7 % SOLN  Place 1 drop into both eyes daily.       .  potassium chloride SA (K-DUR,KLOR-CON) 20 MEQ tablet  Take 1 tablet (20 mEq total) by mouth 2 (two) times daily.  180 tablet  6   .  traMADol (ULTRAM) 50 MG tablet  Take 1 tablet (50 mg total) by mouth every 6 (six) hours as needed.  15 tablet  0   .  triamcinolone cream (KENALOG) 0.1 %  Apply 1 application topically as directed.  30 g  1   .  triamterene-hydrochlorothiazide (MAXZIDE-25) 37.5-25 MG per tablet  Take 1 tablet by mouth daily.  30 tablet  6   .  digoxin (LANOXIN) 0.125 MG tablet  Take 1 tablet (125 mcg total) by mouth daily. (Patient not taking: Reported on 08/06/2015)  30 tablet  11      No current facility-administered medications for this visit.     ROS:    General:  No weight loss, Fever, chills  HEENT: No recent headaches, no nasal bleeding, no visual changes, no sore throat  Neurologic: No dizziness, blackouts, seizures. No recent symptoms of stroke or  mini- stroke. No recent episodes of slurred speech, or temporary blindness.  Cardiac: No recent episodes of chest pain/pressure, no shortness of breath at rest.  No shortness of breath with exertion.  Denies history of atrial fibrillation or irregular heartbeat  Vascular: No history of rest pain in feet.  No history of claudication.  No history of non-healing ulcer, No history of DVT    Pulmonary: No home oxygen, no productive cough, no hemoptysis,  No asthma or wheezing  Musculoskeletal:  [ ]  Arthritis, [ ]  Low back pain,  [ ]  Joint pain  Hematologic:No history of hypercoagulable state.  No history of easy bleeding.  No history of anemia  Gastrointestinal: No hematochezia or melena,  No gastroesophageal reflux, no trouble swallowing  Urinary: [ ]  chronic Kidney disease, [ ]  on HD - [ ]  MWF or [ ]  TTHS, [ ]  Burning with urination, [ ]  Frequent urination, [ ]  Difficulty urinating;    Skin: No rashes  Psychological: No history of anxiety,  No history of depression   Physical Examination    Filed Vitals:   10/09/15 1113 10/09/15 1114  BP: 175/75 146/73  Pulse: 54   Temp: 98.5 F (36.9 C)   TempSrc: Oral   Resp: 20   Height: 5\' 2"  (1.575 m)   Weight: 173 lb 12.8 oz (78.835 kg)     General:  Alert and oriented, no acute distress Abdomen: Soft, mild lower abdominal tenderness, non-distended, no mass, no scars Skin: No rash  DATA:  Recent CT scan of abdomen and pelvis is reviewed. This shows a flap-like stenosis in the proximal celiac which is short in length with some poststenotic dilatation. The superior mesenteric and inferior mesenteric arteries are widely patent. There is really no significant atherosclerotic occlusive disease throughout her abdominal vascular tree.   ASSESSMENT: Patient with chronic abdominal pain which at this point seems to be resolved. She has  an isolated celiac artery stenosis. Since her superior mesenteric artery and inferior mesenteric arteries are  widely patent. It is unlikely that an isolated celiac artery stenosis could be causing her symptoms.   PLAN:  At this point would not considering the inner further intervention as she is asymptomatic. If her symptoms recur then she would need to reschedule her GI appointment. She will follow-up with [place] on as-needed basis. She was given a prescription today for 2 Phenergan suppositories for her nausea.  Ruta Hinds, MD [facility] Office: [phone] Pager: [phone]

## 2015-10-09 NOTE — Progress Notes (Signed)
Filed Vitals:   10/09/15 1113 10/09/15 1114  BP: 175/75 146/73  Pulse: 54   Temp: 98.5 F (36.9 C)   TempSrc: Oral   Resp: 20   Height: 5\' 2"  (1.575 m)   Weight: 173 lb 12.8 oz (78.835 kg)

## 2015-10-10 ENCOUNTER — Other Ambulatory Visit (HOSPITAL_COMMUNITY): Payer: BC Managed Care – PPO

## 2015-10-10 ENCOUNTER — Ambulatory Visit (HOSPITAL_COMMUNITY): Payer: BC Managed Care – PPO

## 2015-10-11 ENCOUNTER — Encounter (HOSPITAL_COMMUNITY): Payer: Self-pay

## 2015-10-11 ENCOUNTER — Observation Stay (HOSPITAL_COMMUNITY)
Admission: EM | Admit: 2015-10-11 | Discharge: 2015-10-14 | Disposition: A | Discharge status: Home / Self Care | Payer: BC Managed Care – PPO | Attending: Internal Medicine | Admitting: Internal Medicine

## 2015-10-11 DIAGNOSIS — Z7982 Long term (current) use of aspirin: Secondary | ICD-10-CM | POA: Diagnosis not present

## 2015-10-11 DIAGNOSIS — Z79899 Other long term (current) drug therapy: Secondary | ICD-10-CM | POA: Insufficient documentation

## 2015-10-11 DIAGNOSIS — I4891 Unspecified atrial fibrillation: Secondary | ICD-10-CM | POA: Diagnosis present

## 2015-10-11 DIAGNOSIS — Z87442 Personal history of urinary calculi: Secondary | ICD-10-CM | POA: Diagnosis not present

## 2015-10-11 DIAGNOSIS — N390 Urinary tract infection, site not specified: Secondary | ICD-10-CM | POA: Diagnosis not present

## 2015-10-11 DIAGNOSIS — E559 Vitamin D deficiency, unspecified: Secondary | ICD-10-CM | POA: Diagnosis not present

## 2015-10-11 DIAGNOSIS — I10 Essential (primary) hypertension: Secondary | ICD-10-CM | POA: Diagnosis present

## 2015-10-11 DIAGNOSIS — R112 Nausea with vomiting, unspecified: Secondary | ICD-10-CM

## 2015-10-11 DIAGNOSIS — E860 Dehydration: Secondary | ICD-10-CM | POA: Diagnosis not present

## 2015-10-11 DIAGNOSIS — Z87440 Personal history of urinary (tract) infections: Secondary | ICD-10-CM | POA: Insufficient documentation

## 2015-10-11 DIAGNOSIS — R319 Hematuria, unspecified: Secondary | ICD-10-CM | POA: Diagnosis not present

## 2015-10-11 DIAGNOSIS — K219 Gastro-esophageal reflux disease without esophagitis: Secondary | ICD-10-CM | POA: Insufficient documentation

## 2015-10-11 DIAGNOSIS — E782 Mixed hyperlipidemia: Secondary | ICD-10-CM | POA: Diagnosis present

## 2015-10-11 DIAGNOSIS — I480 Paroxysmal atrial fibrillation: Secondary | ICD-10-CM | POA: Insufficient documentation

## 2015-10-11 DIAGNOSIS — R110 Nausea: Secondary | ICD-10-CM | POA: Diagnosis present

## 2015-10-11 DIAGNOSIS — N200 Calculus of kidney: Secondary | ICD-10-CM

## 2015-10-11 LAB — COMPREHENSIVE METABOLIC PANEL
ALBUMIN: 3.9 g/dL (ref 3.5–5.0)
ALT: 10 U/L — AB (ref 14–54)
AST: 16 U/L (ref 15–41)
Alkaline Phosphatase: 86 U/L (ref 38–126)
Anion gap: 13 (ref 5–15)
BUN: 18 mg/dL (ref 6–20)
CHLORIDE: 100 mmol/L — AB (ref 101–111)
CO2: 22 mmol/L (ref 22–32)
CREATININE: 1.15 mg/dL — AB (ref 0.44–1.00)
Calcium: 10.8 mg/dL — ABNORMAL HIGH (ref 8.9–10.3)
GFR calc Af Amer: 59 mL/min — ABNORMAL LOW (ref >60)
GFR calc non Af Amer: 51 mL/min — ABNORMAL LOW (ref >60)
GLUCOSE: 110 mg/dL — AB (ref 65–99)
POTASSIUM: 4.7 mmol/L (ref 3.5–5.1)
SODIUM: 135 mmol/L (ref 135–145)
Total Bilirubin: 0.9 mg/dL (ref 0.3–1.2)
Total Protein: 7.7 g/dL (ref 6.5–8.1)

## 2015-10-11 LAB — URINE MICROSCOPIC-ADD ON: SQUAMOUS EPITHELIAL / LPF: NONE SEEN

## 2015-10-11 LAB — LIPASE, BLOOD: LIPASE: 22 U/L (ref 11–51)

## 2015-10-11 LAB — I-STAT TROPONIN, ED: Troponin i, poc: 0.02 ng/mL (ref 0.00–0.08)

## 2015-10-11 LAB — URINALYSIS, ROUTINE W REFLEX MICROSCOPIC
Bilirubin Urine: NEGATIVE
GLUCOSE, UA: NEGATIVE mg/dL
KETONES UR: 15 mg/dL — AB
Nitrite: NEGATIVE
PH: 6 (ref 5.0–8.0)
Protein, ur: >300 mg/dL — AB
Specific Gravity, Urine: 1.014 (ref 1.005–1.030)

## 2015-10-11 LAB — CBC
HEMATOCRIT: 49.7 % — AB (ref 36.0–46.0)
Hemoglobin: 17.3 g/dL — ABNORMAL HIGH (ref 12.0–15.0)
MCH: 31.3 pg (ref 26.0–34.0)
MCHC: 34.8 g/dL (ref 30.0–36.0)
MCV: 89.9 fL (ref 78.0–100.0)
PLATELETS: 327 10*3/uL (ref 150–400)
RBC: 5.53 MIL/uL — ABNORMAL HIGH (ref 3.87–5.11)
RDW: 13.6 % (ref 11.5–15.5)
WBC: 8.7 10*3/uL (ref 4.0–10.5)

## 2015-10-11 MED ORDER — ONDANSETRON 4 MG PO TBDP
4.0000 mg | ORAL_TABLET | Freq: Once | ORAL | Status: AC | PRN
  Administered 2015-10-11: 4 mg via ORAL

## 2015-10-11 MED ORDER — ONDANSETRON 4 MG PO TBDP
ORAL_TABLET | ORAL | Status: AC
  Filled 2015-10-11: qty 1

## 2015-10-11 MED ORDER — ONDANSETRON HCL 4 MG/2ML IJ SOLN
4.0000 mg | Freq: Once | INTRAMUSCULAR | Status: AC
  Administered 2015-10-11: 4 mg via INTRAVENOUS
  Filled 2015-10-11: qty 2

## 2015-10-11 NOTE — ED Notes (Signed)
New EKG done at triage

## 2015-10-11 NOTE — ED Provider Notes (Signed)
gCSN: [id number]     Arrival date & time 10/11/15  1545 History  By signing my name below, I, Terressa Koyanagi, attest that this documentation has been prepared under the direction and in the presence of Harlene Ramus, [place]. Electronically Signed: Terressa Koyanagi, ED Scribe. 10/12/2015. 9:53 PM.   Chief Complaint  Patient presents with  . Emesis   Patient is a 59 y.o. female presenting with vomiting. The history is provided by the patient. No language interpreter was used.  Emesis Associated symptoms: abdominal pain   Associated symptoms: no chills and no diarrhea    PCP: Alesia Richards, MD HPI Comments: [name] is a 58 y.o. female, with PMHx noted below including recent surgery (nephrolithotomy percutaneous and cystoscopy with holmium laser lithotripsy 10/06/15), who presents to the Emergency Department complaining of nausea and emesis onset 10/06/15 following her surgery. Pt reports having 6-7 episodes of NBNB vomiting daily. Pt reports using phenergan at home without relief. Pt denies being on any antibiotics. Pt also complains of intermittent palpitations while at rest--pt notes the last episode was last night and then occurred again while in the ED. Pt confirms mild abd pain, however, pt denies: diarrhea, fever, SOB, cough, chest pain, swelling of BLE.    Past Medical History  Diagnosis Date  . MRSA (methicillin resistant staph aureus) culture positive   . Eczema   . Hypercholesteremia   . Hypertension   . Kidney stones   . Anemia   . Vitamin D deficiency   . Paroxysmal atrial fibrillation ([place]) 10/2011, 06/2015  . GERD (gastroesophageal reflux disease)   . Atrial fibrillation ([place]) 2016  . Dysrhythmia     A FIB - followed by Dr. Meda Coffee   Past Surgical History  Procedure Laterality Date  . Breast surgery Left     biopsy-neg  . Kidney stone surgery  2013  . Nephrolithotomy Right 10/06/2015    Procedure: NEPHROLITHOTOMY PERCUTANEOUS;  Surgeon: Kathie Rhodes, MD;   Location: [facility];  Service: Urology;  Laterality: Right;  . Cystoscopy with holmium laser lithotripsy Right 10/06/2015    Procedure: CYSTOSCOPY WITH HOLMIUM LASER LITHOTRIPSY;  Surgeon: Kathie Rhodes, MD;  Location: [facility];  Service: Urology;  Laterality: Right;   Family History  Problem Relation Age of Onset  . Hypertension Mother   . Hepatitis C Mother   . Heart disease Father   . Dementia Father   . Aneurysm Brother    Social History  Substance Use Topics  . Smoking status: Never Smoker   . Smokeless tobacco: Never Used  . Alcohol Use: No   OB History    No data available     Review of Systems  Constitutional: Negative for fever and chills.  Respiratory: Negative for cough and shortness of breath.   Cardiovascular: Positive for palpitations. Negative for chest pain and leg swelling.  Gastrointestinal: Positive for nausea, vomiting and abdominal pain. Negative for diarrhea.  All other systems reviewed and are negative.  Allergies  Penicillins  Home Medications   Prior to Admission medications   Medication Sig Start Date End Date Taking? Authorizing Provider  ascorbic acid (VITAMIN C) 1000 MG tablet Take 1,000 mg by mouth daily.   Yes Historical Provider, MD  aspirin EC 81 MG tablet Take 1 tablet (81 mg total) by mouth daily. 08/18/15  Yes Dorothy Spark, MD  atenolol (TENORMIN) 100 MG tablet Take 100 mg by mouth every morning.    Yes Historical Provider, MD  cholecalciferol (VITAMIN D) 1000 UNITS  tablet Take 4,000 Units by mouth daily.   Yes Historical Provider, MD  diltiazem (CARDIZEM CD) 360 MG 24 hr capsule Take 1 capsule (360 mg total) by mouth daily. 07/10/15  Yes Bhavinkumar Bhagat, PA  doxepin (SINEQUAN) 50 MG capsule Take 1 capsule (50 mg total) by mouth at bedtime. 09/18/15  Yes Courtney Forcucci, PA-C  ferrous sulfate 325 (65 FE) MG tablet Take 325 mg by mouth daily with breakfast.   Yes Historical Provider, MD  ofloxacin (FLOXIN) 0.3 % otic solution Place 10  drops into the right ear daily. 09/18/15  Yes Courtney Forcucci, PA-C  PAZEO 0.7 % SOLN Place 1 drop into both eyes daily. 07/04/15  Yes Historical Provider, MD  potassium chloride SA (K-DUR,KLOR-CON) 20 MEQ tablet Take 1 tablet (20 mEq total) by mouth 2 (two) times daily. Patient taking differently: Take 40 mEq by mouth daily.  02/13/15  Yes Dorothy Spark, MD  promethazine (PHENERGAN) 25 MG suppository Place 1 suppository (25 mg total) rectally every 6 (six) hours as needed for nausea or vomiting. 10/09/15  Yes Elam Dutch, MD  promethazine (PHENERGAN) 25 MG tablet Take 25 mg by mouth every 6 (six) hours as needed for nausea or vomiting.   Yes Historical Provider, MD  traMADol (ULTRAM) 50 MG tablet Take 1 tablet (50 mg total) by mouth every 6 (six) hours as needed. Patient taking differently: Take 50 mg by mouth every 6 (six) hours as needed for moderate pain.  08/03/15  Yes Julianne Rice, MD  triamcinolone cream (KENALOG) 0.1 % Apply 1 application topically as directed. Patient taking differently: Apply 1 application topically 2 (two) times daily. Apply to affected area for eczema 07/25/15  Yes Unk Pinto, MD  triamterene-hydrochlorothiazide (MAXZIDE-25) 37.5-25 MG per tablet Take 1 tablet by mouth daily. 02/13/15  Yes Dorothy Spark, MD  atorvastatin (LIPITOR) 80 MG tablet Take 26.67 mg by mouth See admin instructions. Take 1/3 tablet by mouth three times a week on Tuesday, thursday, and Saturday    Historical Provider, MD   Triage Vitals: BP 139/100 mmHg  Pulse 74  Temp(Src) 97.5 F (36.4 C) (Oral)  Resp 19  SpO2 97% Physical Exam  Constitutional: She is oriented to person, place, and time. She appears well-developed and well-nourished. No distress.  HENT:  Head: Normocephalic and atraumatic.  Mouth/Throat: Oropharynx is clear and moist. No oropharyngeal exudate.  Eyes: Conjunctivae and EOM are normal. Right eye exhibits no discharge. Left eye exhibits no discharge. No  scleral icterus.  Neck: Normal range of motion. Neck supple.  Cardiovascular: Normal rate, regular rhythm, normal heart sounds and intact distal pulses.   No murmur heard. Pulmonary/Chest: Effort normal and breath sounds normal. No respiratory distress. She has no wheezes. She has no rales. She exhibits no tenderness.  Abdominal: Soft. Bowel sounds are normal. She exhibits no distension and no mass. There is no tenderness. There is no rebound and no guarding.  Musculoskeletal: Normal range of motion. She exhibits no edema.  Lymphadenopathy:    She has no cervical adenopathy.  Neurological: She is alert and oriented to person, place, and time.  Skin: Skin is warm and dry. She is not diaphoretic.  Nursing note and vitals reviewed.   ED Course  Procedures (including critical care time) DIAGNOSTIC STUDIES: Oxygen Saturation is 97% on ra, nl by my interpretation.    COORDINATION OF CARE: 9:48 PM: Discussed treatment plan which includes EKG results and labs with pt at bedside; patient verbalizes understanding and agrees with treatment  plan.  Labs Review Labs Reviewed  COMPREHENSIVE METABOLIC PANEL - Abnormal; Notable for the following:    Chloride 100 (*)    Glucose, Bld 110 (*)    Creatinine, Ser 1.15 (*)    Calcium 10.8 (*)    ALT 10 (*)    GFR calc non Af Amer 51 (*)    GFR calc Af Amer 59 (*)    All other components within normal limits  CBC - Abnormal; Notable for the following:    RBC 5.53 (*)    Hemoglobin 17.3 (*)    HCT 49.7 (*)    All other components within normal limits  URINALYSIS, ROUTINE W REFLEX MICROSCOPIC (NOT AT [facility]) - Abnormal; Notable for the following:    APPearance CLOUDY (*)    Hgb urine dipstick LARGE (*)    Ketones, ur 15 (*)    Protein, ur >300 (*)    Leukocytes, UA LARGE (*)    All other components within normal limits  URINE MICROSCOPIC-ADD ON - Abnormal; Notable for the following:    Bacteria, UA MANY (*)    All other components within normal  limits  URINE CULTURE  LIPASE, BLOOD  I-STAT TROPOININ, ED   I have personally reviewed and evaluated these  lab results as part of my medical decision-making.   EKG Interpretation None      MDM   Final diagnoses:  Acute UTI  Nausea and vomiting in adult    Pt presents with N/V s/p percutaneous nephrolithotomy and stent placement. Pt also endorses having intermittent "heart fluttering". Hx of afib, currently on ASA. Pt was given ODT zofran while in the ED and notes her nausea has resolved. VSS. Exam unremarkable. EKG revealed NSR. UA consistent with UTI. Consulted urology regarding UTI s/p stent placement, agree to start pt on antibiotics. Plan to PO challenge pt. Pt vomited during PO challenge. Due to pt being unable to tolerate PO and needing to be on antibiotics for UTI will consult hospitalist for admission. Dr. Arnoldo Morale agrees to admit pt for obs, orders placed for tele bed. Consulted pharmacy regarding antibiotic choice for tx who recommend using IV rocephin due to pt not having anaphylactic allergy to penicillin. Discussed results and plan for admission with pt.   I personally performed the services described in this documentation, which was scribed in my presence. The recorded information has been reviewed and is accurate.     Chesley Noon [place], [place] 10/12/15 0024  Milton Ferguson, MD 10/13/15 [phone]

## 2015-10-11 NOTE — ED Notes (Signed)
Pts daughter reports patient had kidney stones and had a stent placed on Monday 12/19. She has been feeling bad since then with nausea and vomiting, no diarrhea. Her daughter called her surgeon today and was told to bring patient into ER to have EKG done to see if pt is in A-fib since she has hx of afib. EKG done at triage, NSR with rate of 93.

## 2015-10-11 NOTE — ED Notes (Signed)
Pts daughter came to nurse first desk to advise pt is having "fluttering" and chest pain.  Varney Biles, RN triage nurse will do an EKG. Pt and family advised.

## 2015-10-11 NOTE — ED Notes (Signed)
Pt was reporting her heart was fluttering and she was very nauseous. Acuity raised to 2.

## 2015-10-12 DIAGNOSIS — E860 Dehydration: Secondary | ICD-10-CM | POA: Diagnosis present

## 2015-10-12 DIAGNOSIS — G43A1 Cyclical vomiting, intractable: Secondary | ICD-10-CM | POA: Diagnosis not present

## 2015-10-12 DIAGNOSIS — N390 Urinary tract infection, site not specified: Secondary | ICD-10-CM | POA: Insufficient documentation

## 2015-10-12 DIAGNOSIS — N200 Calculus of kidney: Secondary | ICD-10-CM

## 2015-10-12 DIAGNOSIS — I10 Essential (primary) hypertension: Secondary | ICD-10-CM

## 2015-10-12 DIAGNOSIS — I480 Paroxysmal atrial fibrillation: Secondary | ICD-10-CM

## 2015-10-12 DIAGNOSIS — E782 Mixed hyperlipidemia: Secondary | ICD-10-CM

## 2015-10-12 LAB — BASIC METABOLIC PANEL
ANION GAP: 14 (ref 5–15)
BUN: 21 mg/dL — AB (ref 6–20)
CALCIUM: 10.2 mg/dL (ref 8.9–10.3)
CO2: 21 mmol/L — ABNORMAL LOW (ref 22–32)
Chloride: 100 mmol/L — ABNORMAL LOW (ref 101–111)
Creatinine, Ser: 1.05 mg/dL — ABNORMAL HIGH (ref 0.44–1.00)
GFR calc Af Amer: >60 mL/min (ref >60)
GFR calc non Af Amer: 57 mL/min — ABNORMAL LOW (ref >60)
GLUCOSE: 94 mg/dL (ref 65–99)
Potassium: 3.6 mmol/L (ref 3.5–5.1)
Sodium: 135 mmol/L (ref 135–145)

## 2015-10-12 LAB — CBC
HEMATOCRIT: 45.1 % (ref 36.0–46.0)
HEMOGLOBIN: 15.5 g/dL — AB (ref 12.0–15.0)
MCH: 30.7 pg (ref 26.0–34.0)
MCHC: 34.4 g/dL (ref 30.0–36.0)
MCV: 89.3 fL (ref 78.0–100.0)
Platelets: 285 10*3/uL (ref 150–400)
RBC: 5.05 MIL/uL (ref 3.87–5.11)
RDW: 13.7 % (ref 11.5–15.5)
WBC: 7.8 10*3/uL (ref 4.0–10.5)

## 2015-10-12 LAB — TROPONIN I

## 2015-10-12 MED ORDER — ACETAMINOPHEN 325 MG PO TABS: 650.0000 mg | ORAL_TABLET | Freq: Four times a day (QID) | ORAL | Status: DC | PRN

## 2015-10-12 MED ORDER — ALUM & MAG HYDROXIDE-SIMETH 200-200-20 MG/5ML PO SUSP: 30.0000 mL | Freq: Four times a day (QID) | ORAL | Status: DC | PRN

## 2015-10-12 MED ORDER — ONDANSETRON HCL 4 MG PO TABS: 4.0000 mg | ORAL_TABLET | Freq: Four times a day (QID) | ORAL | Status: DC | PRN

## 2015-10-12 MED ORDER — DEXTROSE 5 % IV SOLN
1.0000 g | Freq: Once | INTRAVENOUS | Status: AC
  Administered 2015-10-12: 1 g via INTRAVENOUS
  Filled 2015-10-12: qty 10

## 2015-10-12 MED ORDER — FERROUS SULFATE 325 (65 FE) MG PO TABS
325.0000 mg | ORAL_TABLET | Freq: Every day | ORAL | Status: DC
  Administered 2015-10-12 – 2015-10-14 (×3): 325 mg via ORAL
  Filled 2015-10-12 (×3): qty 1

## 2015-10-12 MED ORDER — SODIUM CHLORIDE 0.9 % IJ SOLN
3.0000 mL | Freq: Two times a day (BID) | INTRAMUSCULAR | Status: DC
  Administered 2015-10-12 – 2015-10-13 (×2): 3 mL via INTRAVENOUS

## 2015-10-12 MED ORDER — DILTIAZEM HCL ER COATED BEADS 180 MG PO CP24
360.0000 mg | ORAL_CAPSULE | Freq: Every day | ORAL | Status: DC
  Administered 2015-10-12 – 2015-10-13 (×2): 360 mg via ORAL
  Filled 2015-10-12 (×2): qty 2

## 2015-10-12 MED ORDER — ATORVASTATIN CALCIUM 20 MG PO TABS: 20.0000 mg | ORAL_TABLET | ORAL | Status: DC

## 2015-10-12 MED ORDER — OXYCODONE HCL 5 MG PO TABS: 5.0000 mg | ORAL_TABLET | ORAL | Status: DC | PRN

## 2015-10-12 MED ORDER — ATENOLOL 25 MG PO TABS
100.0000 mg | ORAL_TABLET | Freq: Every day | ORAL | Status: DC
  Administered 2015-10-12 – 2015-10-14 (×3): 100 mg via ORAL
  Filled 2015-10-12 (×3): qty 4

## 2015-10-12 MED ORDER — DOXEPIN HCL 10 MG PO CAPS
50.0000 mg | ORAL_CAPSULE | Freq: Every day | ORAL | Status: DC
  Administered 2015-10-12 – 2015-10-13 (×3): 50 mg via ORAL
  Filled 2015-10-12 (×3): qty 5

## 2015-10-12 MED ORDER — HYDROMORPHONE HCL 1 MG/ML IJ SOLN: 0.5000 mg | INTRAMUSCULAR | Status: DC | PRN

## 2015-10-12 MED ORDER — ONDANSETRON HCL 4 MG/2ML IJ SOLN
4.0000 mg | Freq: Four times a day (QID) | INTRAMUSCULAR | Status: DC | PRN
  Administered 2015-10-12: 4 mg via INTRAVENOUS
  Filled 2015-10-12: qty 2

## 2015-10-12 MED ORDER — SODIUM CHLORIDE 0.9 % IV SOLN
INTRAVENOUS | Status: DC
  Administered 2015-10-12 – 2015-10-14 (×3): via INTRAVENOUS

## 2015-10-12 MED ORDER — ENOXAPARIN SODIUM 40 MG/0.4ML ~~LOC~~ SOLN
40.0000 mg | Freq: Every day | SUBCUTANEOUS | Status: DC
  Administered 2015-10-12 – 2015-10-13 (×2): 40 mg via SUBCUTANEOUS
  Filled 2015-10-12 (×2): qty 0.4

## 2015-10-12 MED ORDER — POTASSIUM CHLORIDE CRYS ER 20 MEQ PO TBCR
40.0000 meq | EXTENDED_RELEASE_TABLET | Freq: Once | ORAL | Status: AC
  Administered 2015-10-12: 40 meq via ORAL
  Filled 2015-10-12: qty 2

## 2015-10-12 MED ORDER — ASPIRIN EC 81 MG PO TBEC
81.0000 mg | DELAYED_RELEASE_TABLET | Freq: Every day | ORAL | Status: DC
  Administered 2015-10-12 – 2015-10-14 (×3): 81 mg via ORAL
  Filled 2015-10-12 (×3): qty 1

## 2015-10-12 MED ORDER — VITAMIN D 1000 UNITS PO TABS
4000.0000 [IU] | ORAL_TABLET | Freq: Every day | ORAL | Status: DC
  Administered 2015-10-12 – 2015-10-14 (×3): 4000 [IU] via ORAL
  Filled 2015-10-12 (×4): qty 4

## 2015-10-12 MED ORDER — ONDANSETRON HCL 4 MG/2ML IJ SOLN: 4.0000 mg | Freq: Three times a day (TID) | INTRAMUSCULAR | Status: DC | PRN

## 2015-10-12 MED ORDER — MAGNESIUM SULFATE 2 GM/50ML IV SOLN
2.0000 g | Freq: Once | INTRAVENOUS | Status: AC
  Administered 2015-10-12: 2 g via INTRAVENOUS
  Filled 2015-10-12: qty 50

## 2015-10-12 MED ORDER — ACETAMINOPHEN 650 MG RE SUPP: 650.0000 mg | Freq: Four times a day (QID) | RECTAL | Status: DC | PRN

## 2015-10-12 NOTE — H&P (Signed)
[facility] Admission History and Physical       [name] [id number] DOB: 05-31-1956 DOA: 10/11/2015  Referring physician: EDP PCP: Audrey Richards, MD  Specialists:   Chief Complaint: Nausea and Vomiting    HPI: [name] is a 59 y.o. female with a history of Atrial Fibrillation, HTN, Nephrolithiasis who presents to the ED with complaints of Nausea and Vomiting for the past 6 days since her Kidney stone removal and Ureteral Stent placement on 12/19.   She has been taking Phenergan tablets and suppositories without relief.   She reports having increased weakness today and having palpitations.   She was found to have an elevation in her BUN/CR and hemoconcentration and was referred for admission.      Review of Systems:   Constitutional: No Weight Loss, No Weight Gain, Night Sweats, Fevers, Chills, Dizziness, Light Headedness, Fatigue, +Generalized Weakness HEENT: No Headaches, Difficulty Swallowing,Tooth/Dental Problems,Sore Throat,  No Sneezing, Rhinitis, Ear Ache, Nasal Congestion, or Post Nasal Drip,  Cardio-vascular:  No Chest pain, Orthopnea, PND, Edema in Lower Extremities, Anasarca, Dizziness, +Palpitations  Resp: No Dyspnea, No DOE, No Productive Cough, No Non-Productive Cough, No Hemoptysis, No Wheezing.    GI: No Heartburn, Indigestion, Abdominal Pain, +Nausea, +Vomiting, Diarrhea, Constipation, Hematemesis, Hematochezia, Melena, Change in Bowel Habits,  Loss of Appetite  GU: No Dysuria, No Change in Color of Urine, No Urgency or Urinary Frequency, No Flank pain.  Musculoskeletal: No Joint Pain or Swelling, No Decreased Range of Motion, No Back Pain.  Neurologic: No Syncope, No Seizures, Muscle Weakness, Paresthesia, Vision Disturbance or Loss, No Diplopia, No Vertigo, No Difficulty Walking,  Skin: No Rash or Lesions. Psych: No Change in Mood or Affect, No Depression or Anxiety, No Memory loss, No Confusion, or Hallucinations  Past Medical  History  Diagnosis Date  . MRSA (methicillin resistant staph aureus) culture positive   . Eczema   . Hypercholesteremia   . Hypertension   . Kidney stones   . Anemia   . Vitamin D deficiency   . Paroxysmal atrial fibrillation ([name]) 10/2011, 06/2015  . GERD (gastroesophageal reflux disease)   . Atrial fibrillation ([place]) 2016  . Dysrhythmia     A FIB - followed by Dr. Meda Church     Past Surgical History  Procedure Laterality Date  . Breast surgery Left     biopsy-neg  . Kidney stone surgery  2013  . Nephrolithotomy Right 10/06/2015    Procedure: NEPHROLITHOTOMY PERCUTANEOUS;  Surgeon: Audrey Rhodes, MD;  Location: [facility];  Service: Urology;  Laterality: Right;  . Cystoscopy with holmium laser lithotripsy Right 10/06/2015    Procedure: CYSTOSCOPY WITH HOLMIUM LASER LITHOTRIPSY;  Surgeon: Audrey Rhodes, MD;  Location: [facility];  Service: Urology;  Laterality: Right;      Prior to Admission medications   Medication Sig Start Date End Date Taking? Authorizing Provider  ascorbic acid (VITAMIN C) 1000 MG tablet Take 1,000 mg by mouth daily.   Yes Historical Provider, MD  aspirin EC 81 MG tablet Take 1 tablet (81 mg total) by mouth daily. 08/18/15  Yes Audrey Spark, MD  atenolol (TENORMIN) 100 MG tablet Take 100 mg by mouth every morning.    Yes Historical Provider, MD  cholecalciferol (VITAMIN D) 1000 UNITS tablet Take 4,000 Units by mouth daily.   Yes Historical Provider, MD  diltiazem (CARDIZEM CD) 360 MG 24 hr capsule Take 1 capsule (360 mg total) by mouth daily. 07/10/15  Yes Audrey Bhagat, PA  doxepin (SINEQUAN) 50  MG capsule Take 1 capsule (50 mg total) by mouth at bedtime. 09/18/15  Yes Audrey Forcucci, PA-C  ferrous sulfate 325 (65 FE) MG tablet Take 325 mg by mouth daily with breakfast.   Yes Historical Provider, MD  ofloxacin (FLOXIN) 0.3 % otic solution Place 10 drops into the right ear daily. 09/18/15  Yes Audrey Forcucci, PA-C  PAZEO 0.7 % SOLN Place 1 drop into both  eyes daily. 07/04/15  Yes Historical Provider, MD  potassium chloride SA (K-DUR,KLOR-CON) 20 MEQ tablet Take 1 tablet (20 mEq total) by mouth 2 (two) times daily. Patient taking differently: Take 40 mEq by mouth daily.  02/13/15  Yes Audrey Spark, MD  promethazine (PHENERGAN) 25 MG suppository Place 1 suppository (25 mg total) rectally every 6 (six) hours as needed for nausea or vomiting. 10/09/15  Yes Audrey Dutch, MD  promethazine (PHENERGAN) 25 MG tablet Take 25 mg by mouth every 6 (six) hours as needed for nausea or vomiting.   Yes Historical Provider, MD  traMADol (ULTRAM) 50 MG tablet Take 1 tablet (50 mg total) by mouth every 6 (six) hours as needed. Patient taking differently: Take 50 mg by mouth every 6 (six) hours as needed for moderate pain.  08/03/15  Yes Audrey Rice, MD  triamcinolone cream (KENALOG) 0.1 % Apply 1 application topically as directed. Patient taking differently: Apply 1 application topically 2 (two) times daily. Apply to affected area for eczema 07/25/15  Yes Audrey Pinto, MD  triamterene-hydrochlorothiazide (MAXZIDE-25) 37.5-25 MG per tablet Take 1 tablet by mouth daily. 02/13/15  Yes Audrey Spark, MD  atorvastatin (LIPITOR) 80 MG tablet Take 26.67 mg by mouth See admin instructions. Take 1/3 tablet by mouth three times a week on Tuesday, thursday, and Saturday    Historical Provider, MD     Allergies  Allergen Reactions  . Penicillins Other (See Comments)    Unknown reaction as a child. Has patient had a PCN reaction causing immediate rash, facial/tongue/throat swelling, SOB or lightheadedness with hypotension: No Has patient had a PCN reaction causing severe rash involving mucus membranes or skin necrosis: No Has patient had a PCN reaction that required hospitalization No Has patient had a PCN reaction occurring within the last 10 years: No If all of the above answers are "NO", then may proceed with Cephalosporin use.     Social History:   reports that she has never smoked. She has never used smokeless tobacco. She reports that she does not drink alcohol or use illicit drugs.    Family History  Problem Relation Age of Onset  . Hypertension Mother   . Hepatitis C Mother   . Heart disease Father   . Dementia Father   . Aneurysm Brother        Physical Exam:  GEN:  Pleasant Well Nourished and Well Developed 59 y.o. African American female examined and in no acute distress; cooperative with exam Filed Vitals:   10/11/15 2130 10/11/15 2145 10/11/15 2230 10/11/15 2343  BP: 144/88 129/80 118/80 130/108  Pulse: 80 76 82 87  Temp:    98.2 F (36.8 C)  TempSrc:    Oral  Resp: 19 17 16 17   SpO2: 100% 100% 100% 95%   Blood pressure 130/108, pulse 87, temperature 98.2 F (36.8 C), temperature source Oral, resp. rate 17, SpO2 95 %. PSYCH: She is alert and oriented x4; does not appear anxious does not appear depressed; affect is normal HEENT: Normocephalic and Atraumatic, Mucous membranes pink; PERRLA; EOM intact;  Fundi:  Benign;  No scleral icterus, Nares: Patent, Oropharynx: Clear, Fair Dentition,    Neck:  FROM, No Cervical Lymphadenopathy nor Thyromegaly or Carotid Bruit; No JVD; Breasts:: Not examined CHEST WALL: No tenderness CHEST: Normal respiration, clear to auscultation bilaterally HEART: Regular rate and rhythm; no murmurs rubs or gallops BACK: No kyphosis or scoliosis; No CVA tenderness ABDOMEN: Positive Bowel Sounds, Soft Non-Tender, No Rebound or Guarding; No Masses, No Organomegaly. Rectal Exam: Not done EXTREMITIES: No Cyanosis, Clubbing, or Edema; No Ulcerations. Genitalia: not examined PULSES: 2+ and symmetric SKIN: Normal hydration no rash or ulceration CNS:  Alert and Oriented x 4, No Focal Deficits Vascular: pulses palpable throughout    Labs on Admission:  Basic Metabolic Panel:  Recent Labs Lab 10/06/15 0820 10/11/15 1612  NA 142 135  K 3.8 4.7  CL 104 100*  CO2 29 22  GLUCOSE 86 110*    BUN 11 18  CREATININE 0.76 1.15*  CALCIUM 9.5 10.8*   Liver Function Tests:  Recent Labs Lab 10/11/15 1612  AST 16  ALT 10*  ALKPHOS 86  BILITOT 0.9  PROT 7.7  ALBUMIN 3.9    Recent Labs Lab 10/11/15 1612  LIPASE 22   No results for input(s): AMMONIA in the last 168 hours. CBC:  Recent Labs Lab 10/06/15 0820 10/11/15 1612  WBC 4.4 8.7  NEUTROABS 2.2  --   HGB 13.9 17.3*  HCT 42.5 49.7*  MCV 94.4 89.9  PLT 241 327   Cardiac Enzymes: No results for input(s): CKTOTAL, CKMB, CKMBINDEX, TROPONINI in the last 168 hours.  BNP (last 3 results) No results for input(s): BNP in the last 8760 hours.  ProBNP (last 3 results) No results for input(s): PROBNP in the last 8760 hours.  CBG: No results for input(s): GLUCAP in the last 168 hours.  Radiological Exams on Admission: No results found.   EKG: Independently reviewed.    Assessment/Plan:   59 y.o. female with  Principal Problem:    Dehydration from #2   IVFs   Hold HCTZ Rx   Active Problems:    Nausea & vomiting   PRN IV Zofran   IVFs   Hold Tramadol Rx      Paroxysmal Afib- previously on Pradaxa Rx but had Hematuria, so it was discontinued   On Diltiazem   On ASA Rx      Hypertension   Continue Diltiazem Rx      Kidney stones   S/P Ureteral Stent   Urologist Dr Karsten Ro        Mixed hyperlipidemia   On Atorvastatin Rx      DVT Prophylaxis   Lovenox     Code Status:     FULL CODE     Family Communication:   No Family Present    Disposition Plan:    Inpatient Status        Time spent:   [facility] Pager [phone]   If [place] Please Contact the Day Rounding Team MD for [facility]  If 7PM-7AM, Please Contact Night-Floor Coverage  [email or web address] Password [email or web address] 10/12/2015, 12:13 AM     ADDENDUM:   Patient was seen and examined on 10/12/2015

## 2015-10-12 NOTE — Progress Notes (Signed)
Patient seen and examined Tachycardic this morning, nausea has resolved Able to take oral medications this morning Had some hematuria last night which is improving with IV fluids May have passed a stone, Discharged on 10/07/15 by Kathie Rhodes, MD after percutaneous nephrolithotomy, She underwent her right PCNL without complication or the need for a nephrostomy tube  She received a left indwelling ureteral stent, stone removal  Admitted with recurrent urinary tract infection and hematuria, patient states that she has been off all anticoagulants including eliquis , currently only takes aspirin  Patient to remain inpatient until  PO intake is established, continue IV fluids, continue antibiotics for possible pyelonephritis

## 2015-10-12 NOTE — ED Notes (Signed)
Attempted to call report

## 2015-10-13 DIAGNOSIS — E860 Dehydration: Secondary | ICD-10-CM | POA: Diagnosis not present

## 2015-10-13 LAB — URINE CULTURE: Culture: 50000

## 2015-10-13 LAB — COMPREHENSIVE METABOLIC PANEL
ALT: 8 U/L — AB (ref 14–54)
AST: 12 U/L — AB (ref 15–41)
Albumin: 3 g/dL — ABNORMAL LOW (ref 3.5–5.0)
Alkaline Phosphatase: 62 U/L (ref 38–126)
Anion gap: 13 (ref 5–15)
BUN: 12 mg/dL (ref 6–20)
CHLORIDE: 102 mmol/L (ref 101–111)
CO2: 22 mmol/L (ref 22–32)
CREATININE: 0.81 mg/dL (ref 0.44–1.00)
Calcium: 9.2 mg/dL (ref 8.9–10.3)
GFR calc non Af Amer: >60 mL/min (ref >60)
Glucose, Bld: 90 mg/dL (ref 65–99)
POTASSIUM: 3.6 mmol/L (ref 3.5–5.1)
Sodium: 137 mmol/L (ref 135–145)
Total Bilirubin: 0.7 mg/dL (ref 0.3–1.2)
Total Protein: 5.7 g/dL — ABNORMAL LOW (ref 6.5–8.1)

## 2015-10-13 LAB — MAGNESIUM: Magnesium: 2.3 mg/dL (ref 1.7–2.4)

## 2015-10-13 LAB — CBC
HEMATOCRIT: 41.5 % (ref 36.0–46.0)
HEMOGLOBIN: 14.1 g/dL (ref 12.0–15.0)
MCH: 30.6 pg (ref 26.0–34.0)
MCHC: 34 g/dL (ref 30.0–36.0)
MCV: 90 fL (ref 78.0–100.0)
Platelets: 242 10*3/uL (ref 150–400)
RBC: 4.61 MIL/uL (ref 3.87–5.11)
RDW: 13.6 % (ref 11.5–15.5)
WBC: 4.6 10*3/uL (ref 4.0–10.5)

## 2015-10-13 LAB — TROPONIN I: Troponin I: <0.03 ng/mL (ref <0.031)

## 2015-10-13 MED ORDER — DEXTROSE 5 % IV SOLN
1.0000 g | INTRAVENOUS | Status: DC
  Administered 2015-10-13: 1 g via INTRAVENOUS
  Filled 2015-10-13 (×2): qty 10

## 2015-10-13 MED ORDER — POTASSIUM CHLORIDE CRYS ER 20 MEQ PO TBCR
40.0000 meq | EXTENDED_RELEASE_TABLET | Freq: Once | ORAL | Status: AC
  Administered 2015-10-13: 40 meq via ORAL
  Filled 2015-10-13: qty 2

## 2015-10-13 MED ORDER — CIPROFLOXACIN-HYDROCORTISONE 0.2-1 % OT SUSP
3.0000 [drp] | Freq: Two times a day (BID) | OTIC | Status: DC
  Administered 2015-10-13 (×2): 3 [drp] via OTIC
  Filled 2015-10-13: qty 10

## 2015-10-13 NOTE — Progress Notes (Signed)
[facility] PROGRESS NOTE  [name] [id number] DOB: 1956/01/02 DOA: 10/11/2015 PCP: Alesia Richards, MD  Length of stay:    Assessment/Plan: Principal Problem:   Dehydration Active Problems:   Mixed hyperlipidemia   Hypertension   Kidney stones   Paroxysmal Afib   Nausea & vomiting   History of present illness [name] is a 59 y.o. female with a history of Atrial Fibrillation, HTN, Nephrolithiasis who presents to the ED with complaints of Nausea and Vomiting for the past 6 days since her Kidney stone removal and Ureteral Stent placement on 12/19. She has been taking Phenergan tablets and suppositories without relief. She reports having increased weakness today and having palpitations. She was found to have an elevation in her BUN/CR and hemoconcentration and was referred for admission.Admitted with recurrent urinary tract infection and hematuria  Discharged on 10/07/15 by Kathie Rhodes, MD after percutaneous nephrolithotomy, She underwent her right PCNL without complication or the need for a nephrostomy tube  She received a left indwelling ureteral stent, stone removal  Assessment and plan Urinary tract infection/possible pyelonephritis/possible passage of kidney stone Continue IV fluids, Rocephin Urine culture shows 50,000 COLONIES/mL DIPHTHEROIDS(CORYNEBACTERIUM SPECIES) Status post right ureteral stent in satisfactory position on CT scan, multiple left renal calculi, Some perinephric stranding around the right kidney   Paroxysmal atrial fibrillation -replete electrolytes, continue Cardizem, in and out of atrial fibrillation, rate controlled, patient off anticoagulation and does not take eliquis  any more  Dehydration and nausea-improved, continue IV fluids  Hypertension-controlled, continue atenolol, Cardizem   Hyperlipidemia-continue atorvastatin   Superficial cellulitis of the right [place] -started on  ciprofloxacin-hydrocortisone eardrops   DVT prophylaxsis Lovenox  Code Status:      Code Status Orders        Start     Ordered   10/12/15 0142  Full code   Continuous     10/12/15 0141      Family Communication: Discussed in detail with the patient, all imaging results, lab results explained to the patient   Disposition Plan:  Anticipate discharge tomorrow        Consultants None  Procedures:  None    Antibiotics: Anti-infectives    Start     Dose/Rate Route Frequency Ordered Stop   10/12/15 2200  cefTRIAXone (ROCEPHIN) 1 g in dextrose 5 % 50 mL IVPB     1 g 100 mL/hr over 30 Minutes Intravenous  Once 10/12/15 0718 10/12/15 2205   10/12/15 0030  cefTRIAXone (ROCEPHIN) 1 g in dextrose 5 % 50 mL IVPB     1 g 100 mL/hr over 30 Minutes Intravenous  Once 10/12/15 0015 10/12/15 0102         HPI/Subjective: Clinically improving, no nausea no vomiting, heart rate improved  Objective: Filed Vitals:   10/12/15 1453 10/12/15 2103 10/13/15 0543 10/13/15 0600  BP: 119/58 138/54 83/48 132/76  Pulse: 69 53 66 82  Temp: 98 F (36.7 C) 97.9 F (36.6 C) 97.8 F (36.6 C)   TempSrc: Oral Oral Oral   Resp: 16 16 18    Height:      Weight:      SpO2: 100% 100% 100%     Intake/Output Summary (Last 24 hours) at 10/13/15 1210 Last data filed at 10/13/15 0851  Gross per 24 hour  Intake    600 ml  Output      0 ml  Net    600 ml    Exam:  General:  No acute respiratory distress Lungs: Clear to auscultation bilaterally without wheezes or crackles Cardiovascular: Regular rate and rhythm without murmur gallop or rub normal S1 and S2 Abdomen: Nontender, nondistended, soft, bowel sounds positive, no rebound, no ascites, no appreciable mass Extremities: No significant cyanosis, clubbing, or edema bilateral lower extremities     Data Review   Micro Results Recent Results (from the past 240 hour(s))  Urine culture     Status: None   Collection Time: 10/11/15   8:11 PM  Result Value Ref Range Status   Specimen Description URINE, RANDOM  Final   Special Requests NONE  Final   Culture   Final    50,000 COLONIES/mL DIPHTHEROIDS(CORYNEBACTERIUM SPECIES) Standardized susceptibility testing for this organism is not available.    Report Status 10/13/2015 FINAL  Final    Radiology Reports Ct Abdomen Wo Contrast  10/06/2015  CLINICAL DATA:  Status post prior nephrostomy with stent placement, evaluate right renal calculi EXAM: CT ABDOMEN WITHOUT CONTRAST TECHNIQUE: Multidetector CT imaging of the abdomen was performed following the standard protocol without IV contrast. COMPARISON:  CTA abdomen pelvis dated 07/08/2015 FINDINGS: Lower chest: Mild patchy bilateral lower lobe opacities, likely atelectasis. Trace right pleural fluid. Hepatobiliary: Unenhanced liver is unremarkable. Gallbladder is unremarkable. No intrahepatic or extrahepatic ductal dilatation. Pancreas: Within normal limits. Spleen: Within normal limits. Adrenals/Urinary Tract: Adrenal glands are within normal limits. Right kidney is unremarkable. Mild right hydronephrosis with contrast layering posteriorly in the right renal collecting system and scattered foci of nondependent gas. Indwelling right ureteral stent in satisfactory position. No residual right renal or proximal ureteral calculi. Left kidney is notable for cortical scarring in the left lower pole. Multiple left renal calculi measuring up to 8 mm in the left lower pole (series 2/ image 35). No hydronephrosis. Stomach/Bowel: Stomach is within normal limits. Visualized bowel is unremarkable. Vascular/Lymphatic: Atherosclerotic calcifications of the abdominal aorta. No evidence of abdominal aortic aneurysm. No suspicious abdominal lymphadenopathy. Other: Nonspecific stranding/ hemorrhage with gas along the right posterior perirenal space (series 2/ image 36), reflecting postprocedural changes. Musculoskeletal: Degenerative changes of the  visualized thoracolumbar spine. IMPRESSION: Indwelling right ureteral stent with mild right hydronephrosis. No residual right renal or proximal ureteral calculi. Mild stranding/hemorrhage with gas along the right posterior perirenal space, reflecting postprocedural changes. Multiple left renal calculi measuring up to 8 mm in the left lower pole. No hydronephrosis. Please note that the pelvis was not imaged. Electronically Signed   By: Julian Hy M.D.   On: 10/06/2015 18:31   Dg C-arm 1-60 Min-no Report  10/06/2015  CLINICAL DATA: surgery C-ARM 1-60 MINUTES Fluoroscopy was utilized by the requesting physician.  No radiographic interpretation.   Ir Ureteral Stent Placement Existing Access Right  10/06/2015  CLINICAL DATA:  59 year-old with right renal calculus and scheduled for percutaneous nephrolithotomy. Patient needs percutaneous access for the nephrolithotomy procedure. EXAM: PLACEMENT OF RIGHT NEPHROURETERAL CATHETER X 2 WITH ULTRASOUND AND FLUOROSCOPIC GUIDANCE Physician: Stephan Minister. Henn, MD FLUOROSCOPY TIME:  30 minutes and 54 seconds. 958 mGy MEDICATIONS: Ancef 2 g. 7 mg Versed, 125 mcg fentanyl. A radiology nurse monitored the patient for moderate sedation. ANESTHESIA/SEDATION: Moderate sedation time: 1 hour and 35 minutes PROCEDURE: The procedure was explained to the patient. The risks and benefits of the procedure were discussed and the patient's questions were addressed. Informed consent was obtained from the patient. The right flank was marked for this procedure. Patient was placed prone on the interventional table. The right flank was prepped and draped  in sterile fashion. Maximal barrier sterile technique was utilized including caps, mask, sterile gowns, sterile gloves, sterile drape, hand hygiene and skin antiseptic. The right renal calculus was identified with fluoroscopy. Right kidney was evaluated with ultrasound. Mildly dilated upper pole calyx was targeted with ultrasound. A 21 gauge  needle was directed into an upper calyx with ultrasound guidance and contrast was used to opacified the renal collecting system. Collecting system was also opacified with gas. An Accustick dilator set was placed. Calyx associated with the upper pole stone was targeted with fluoroscopy. Wire was advanced into this calyx but could not be successfully advanced beyond the infundibulum stone. Catheter and wire repeatedly went outside of the collecting system. Therefore, the upper pole access was upsized to a 5 [place] catheter. A C2 catheter was used to cannulate the lower pole. Additional contrast was injected. A lower pole calyx was then targeted. A 22 gauge needle was directed into a lower pole calyx with fluoroscopic guidance. Eventually, a wire was advanced into the collecting system and the tract was dilated to accommodate an Accustick dilator set. A 4 French Glide catheter was then advanced through this catheter and advanced down the ureter with a Glidewire. The other 5 French catheter was advanced down the ureter and positioned of the urinary bladder. Both catheters sutured to the skin and capped. Patient was transferred to the PACU. Fluoroscopic and ultrasound images were taken and saved for documentation. Estimated blood loss: Minimal FINDINGS: There is a stone occupying an upper pole infundibulum and part of a calyx. Mild dilatation of the upper pole calices due to partial obstruction from this stone. Access was obtained from a posterior upper pole calyx and a lower pole calyx. Unable to gain access from the calyx associated from the infundibulum stone. COMPLICATIONS: None IMPRESSION: Successful placement of two right nephroureteral catheters with ultrasound and fluoroscopic guidance. One catheter was placed in an upper pole access. The other catheter was placed in a lower pole access. Electronically Signed   By: Markus Daft M.D.   On: 10/06/2015 18:37     CBC  Recent Labs Lab 10/11/15 1612 10/12/15 0416  10/13/15 0020  WBC 8.7 7.8 4.6  HGB 17.3* 15.5* 14.1  HCT 49.7* 45.1 41.5  PLT 327 285 242  MCV 89.9 89.3 90.0  MCH 31.3 30.7 30.6  MCHC 34.8 34.4 34.0  RDW 13.6 13.7 13.6    Chemistries   Recent Labs Lab 10/11/15 1612 10/12/15 0416 10/13/15 0020  NA 135 135 137  K 4.7 3.6 3.6  CL 100* 100* 102  CO2 22 21* 22  GLUCOSE 110* 94 90  BUN 18 21* 12  CREATININE 1.15* 1.05* 0.81  CALCIUM 10.8* 10.2 9.2  MG  --   --  2.3  AST 16  --  12*  ALT 10*  --  8*  ALKPHOS 86  --  62  BILITOT 0.9  --  0.7   ------------------------------------------------------------------------------------------------------------------ estimated creatinine clearance is 70.5 mL/min (by C-G formula based on Cr of 0.81). ------------------------------------------------------------------------------------------------------------------ No results for input(s): HGBA1C in the last 72 hours. ------------------------------------------------------------------------------------------------------------------ No results for input(s): CHOL, HDL, LDLCALC, TRIG, CHOLHDL, LDLDIRECT in the last 72 hours. ------------------------------------------------------------------------------------------------------------------ No results for input(s): TSH, T4TOTAL, T3FREE, THYROIDAB in the last 72 hours.  Invalid input(s): FREET3 ------------------------------------------------------------------------------------------------------------------ No results for input(s): VITAMINB12, FOLATE, FERRITIN, TIBC, IRON, RETICCTPCT in the last 72 hours.  Coagulation profile No results for input(s): INR, PROTIME in the last 168 hours.  No results for input(s): DDIMER in  the last 72 hours.  Cardiac Enzymes  Recent Labs Lab 10/12/15 1830 10/13/15 0020  TROPONINI <0.03 <0.03   ------------------------------------------------------------------------------------------------------------------ Invalid input(s): POCBNP   CBG: No  results for input(s): GLUCAP in the last 168 hours.     Studies: No results found.    Lab Results  Component Value Date   HGBA1C 5.8* 07/29/2015   HGBA1C 5.7* 04/28/2015   HGBA1C 5.8* 12/05/2014   Lab Results  Component Value Date   MICROALBUR 11.9* 04/28/2015   LDLCALC 123 07/29/2015   CREATININE 0.81 10/13/2015       Scheduled Meds: . aspirin EC  81 mg Oral Daily  . atenolol  100 mg Oral Daily  . [START ON 10/14/2015] atorvastatin  20 mg Oral Q T,Th,Sat-1800  . cholecalciferol  4,000 Units Oral Daily  . ciprofloxacin-hydrocortisone  3 drop Right Ear BID  . diltiazem  360 mg Oral Daily  . doxepin  50 mg Oral QHS  . enoxaparin (LOVENOX) injection  40 mg Subcutaneous Daily  . ferrous sulfate  325 mg Oral Q breakfast  . sodium chloride  3 mL Intravenous Q12H   Continuous Infusions: . sodium chloride 100 mL/hr (10/12/15 1148)    Principal Problem:   Dehydration Active Problems:   Mixed hyperlipidemia   Hypertension   Kidney stones   Paroxysmal Afib   Nausea & vomiting    Time spent: 45 minutes   [facility] Pager [phone]. If 7PM-7AM, please contact night-coverage at [email or web address], password [facility] 10/13/2015, 12:10 PM

## 2015-10-14 DIAGNOSIS — E860 Dehydration: Secondary | ICD-10-CM | POA: Diagnosis not present

## 2015-10-14 MED ORDER — CEPHALEXIN 500 MG PO CAPS: 500.0000 mg | ORAL_CAPSULE | Freq: Four times a day (QID) | ORAL | Status: DC

## 2015-10-14 MED ORDER — DILTIAZEM HCL ER COATED BEADS 240 MG PO CP24: 240.0000 mg | ORAL_CAPSULE | Freq: Every day | ORAL | Status: DC

## 2015-10-14 MED ORDER — CIPROFLOXACIN-HYDROCORTISONE 0.2-1 % OT SUSP: 3.0000 [drp] | Freq: Two times a day (BID) | OTIC | Status: DC

## 2015-10-14 NOTE — Progress Notes (Signed)
Patient discharged to home with daughter. Medications were educated on and the patient and daughter both stated that they understood. Discharge instructions were given and understood by both. IV was d'cd and was intact.

## 2015-10-14 NOTE — Discharge Summary (Signed)
Physician Discharge Summary  [name] MRN: [id number] DOB/AGE: May 09, 1956 59 y.o.  PCP: Alesia Richards, MD   Admit date: 10/11/2015 Discharge date: 10/14/2015  Discharge Diagnoses:     Principal Problem:   Dehydration secondary to UTI Active Problems:   Mixed hyperlipidemia   Hypertension   Kidney stones   Paroxysmal Afib   Nausea & vomiting  history of celiac artery stenosis.   Follow-up recommendations Follow-up with PCP in 3-5 days , including all  additional recommended appointments as below Follow-up CBC, CMP in 3-5 days Patient discussed with PCP about anticoagulation as the patient has not been taking her eliquis  Patient needs to follow-up with urology for recurrence of hematuria     Medication List    STOP taking these medications        ofloxacin 0.3 % otic solution  Commonly known as:  FLOXIN     triamterene-hydrochlorothiazide 37.5-25 MG tablet  Commonly known as:  MAXZIDE-25      TAKE these medications        ascorbic acid 1000 MG tablet  Commonly known as:  VITAMIN C  Take 1,000 mg by mouth daily.     aspirin EC 81 MG tablet  Take 1 tablet (81 mg total) by mouth daily.     atenolol 100 MG tablet  Commonly known as:  TENORMIN  Take 100 mg by mouth every morning.     atorvastatin 80 MG tablet  Commonly known as:  LIPITOR  Take 26.67 mg by mouth See admin instructions. Take 1/3 tablet by mouth three times a week on Tuesday, thursday, and Saturday     cephALEXin 500 MG capsule  Commonly known as:  KEFLEX  Take 1 capsule (500 mg total) by mouth 4 (four) times daily.     cholecalciferol 1000 UNITS tablet  Commonly known as:  VITAMIN D  Take 4,000 Units by mouth daily.     ciprofloxacin-hydrocortisone otic suspension  Commonly known as:  CIPRO HC OTIC  Place 3 drops into the right ear 2 (two) times daily.     diltiazem 240 MG 24 hr capsule  Commonly known as:  CARDIZEM CD  Take 1 capsule (240 mg total) by mouth daily.      doxepin 50 MG capsule  Commonly known as:  SINEQUAN  Take 1 capsule (50 mg total) by mouth at bedtime.     ferrous sulfate 325 (65 FE) MG tablet  Take 325 mg by mouth daily with breakfast.     PAZEO 0.7 % Soln  Generic drug:  Olopatadine HCl  Place 1 drop into both eyes daily.     potassium chloride SA 20 MEQ tablet  Commonly known as:  K-DUR,KLOR-CON  Take 1 tablet (20 mEq total) by mouth 2 (two) times daily.     promethazine 25 MG tablet  Commonly known as:  PHENERGAN  Take 25 mg by mouth every 6 (six) hours as needed for nausea or vomiting.     promethazine 25 MG suppository  Commonly known as:  PHENERGAN  Place 1 suppository (25 mg total) rectally every 6 (six) hours as needed for nausea or vomiting.     traMADol 50 MG tablet  Commonly known as:  ULTRAM  Take 1 tablet (50 mg total) by mouth every 6 (six) hours as needed.     triamcinolone cream 0.1 %  Commonly known as:  KENALOG  Apply 1 application topically as directed.         Discharge Condition: Stable  Discharge Instructions          Disposition: 01-Home or Self Care   Consults:    Significant Diagnostic Studies:  Ct Abdomen Wo Contrast  10/06/2015  CLINICAL DATA:  Status post prior nephrostomy with stent placement, evaluate right renal calculi EXAM: CT ABDOMEN WITHOUT CONTRAST TECHNIQUE: Multidetector CT imaging of the abdomen was performed following the standard protocol without IV contrast. COMPARISON:  CTA abdomen pelvis dated 07/08/2015 FINDINGS: Lower chest: Mild patchy bilateral lower lobe opacities, likely atelectasis. Trace right pleural fluid. Hepatobiliary: Unenhanced liver is unremarkable. Gallbladder is unremarkable. No intrahepatic or extrahepatic ductal dilatation. Pancreas: Within normal limits. Spleen: Within normal limits. Adrenals/Urinary Tract: Adrenal glands are within normal limits. Right kidney is unremarkable. Mild right hydronephrosis with contrast layering posteriorly in the  right renal collecting system and scattered foci of nondependent gas. Indwelling right ureteral stent in satisfactory position. No residual right renal or proximal ureteral calculi. Left kidney is notable for cortical scarring in the left lower pole. Multiple left renal calculi measuring up to 8 mm in the left lower pole (series 2/ image 35). No hydronephrosis. Stomach/Bowel: Stomach is within normal limits. Visualized bowel is unremarkable. Vascular/Lymphatic: Atherosclerotic calcifications of the abdominal aorta. No evidence of abdominal aortic aneurysm. No suspicious abdominal lymphadenopathy. Other: Nonspecific stranding/ hemorrhage with gas along the right posterior perirenal space (series 2/ image 36), reflecting postprocedural changes. Musculoskeletal: Degenerative changes of the visualized thoracolumbar spine. IMPRESSION: Indwelling right ureteral stent with mild right hydronephrosis. No residual right renal or proximal ureteral calculi. Mild stranding/hemorrhage with gas along the right posterior perirenal space, reflecting postprocedural changes. Multiple left renal calculi measuring up to 8 mm in the left lower pole. No hydronephrosis. Please note that the pelvis was not imaged. Electronically Signed   By: Julian Hy M.D.   On: 10/06/2015 18:31   Dg C-arm 1-60 Min-no Report  10/06/2015  CLINICAL DATA: surgery C-ARM 1-60 MINUTES Fluoroscopy was utilized by the requesting physician.  No radiographic interpretation.   Ir Ureteral Stent Placement Existing Access Right  10/06/2015  CLINICAL DATA:  59 year-old with right renal calculus and scheduled for percutaneous nephrolithotomy. Patient needs percutaneous access for the nephrolithotomy procedure. EXAM: PLACEMENT OF RIGHT NEPHROURETERAL CATHETER X 2 WITH ULTRASOUND AND FLUOROSCOPIC GUIDANCE Physician: Stephan Minister. Henn, MD FLUOROSCOPY TIME:  30 minutes and 54 seconds. 958 mGy MEDICATIONS: Ancef 2 g. 7 mg Versed, 125 mcg fentanyl. A radiology nurse  monitored the patient for moderate sedation. ANESTHESIA/SEDATION: Moderate sedation time: 1 hour and 35 minutes PROCEDURE: The procedure was explained to the patient. The risks and benefits of the procedure were discussed and the patient's questions were addressed. Informed consent was obtained from the patient. The right flank was marked for this procedure. Patient was placed prone on the interventional table. The right flank was prepped and draped in sterile fashion. Maximal barrier sterile technique was utilized including caps, mask, sterile gowns, sterile gloves, sterile drape, hand hygiene and skin antiseptic. The right renal calculus was identified with fluoroscopy. Right kidney was evaluated with ultrasound. Mildly dilated upper pole calyx was targeted with ultrasound. A 21 gauge needle was directed into an upper calyx with ultrasound guidance and contrast was used to opacified the renal collecting system. Collecting system was also opacified with gas. An Accustick dilator set was placed. Calyx associated with the upper pole stone was targeted with fluoroscopy. Wire was advanced into this calyx but could not be successfully advanced beyond the infundibulum stone. Catheter and wire repeatedly went outside of  the collecting system. Therefore, the upper pole access was upsized to a 5 [place] catheter. A C2 catheter was used to cannulate the lower pole. Additional contrast was injected. A lower pole calyx was then targeted. A 22 gauge needle was directed into a lower pole calyx with fluoroscopic guidance. Eventually, a wire was advanced into the collecting system and the tract was dilated to accommodate an Accustick dilator set. A 4 French Glide catheter was then advanced through this catheter and advanced down the ureter with a Glidewire. The other 5 French catheter was advanced down the ureter and positioned of the urinary bladder. Both catheters sutured to the skin and capped. Patient was transferred to the  PACU. Fluoroscopic and ultrasound images were taken and saved for documentation. Estimated blood loss: Minimal FINDINGS: There is a stone occupying an upper pole infundibulum and part of a calyx. Mild dilatation of the upper pole calices due to partial obstruction from this stone. Access was obtained from a posterior upper pole calyx and a lower pole calyx. Unable to gain access from the calyx associated from the infundibulum stone. COMPLICATIONS: None IMPRESSION: Successful placement of two right nephroureteral catheters with ultrasound and fluoroscopic guidance. One catheter was placed in an upper pole access. The other catheter was placed in a lower pole access. Electronically Signed   By: Markus Daft M.D.   On: 10/06/2015 18:37       Filed Weights   10/12/15 0144  Weight: 76.1 kg (167 lb 12.3 oz)     Microbiology: Recent Results (from the past 240 hour(s))  Urine culture     Status: None   Collection Time: 10/11/15  8:11 PM  Result Value Ref Range Status   Specimen Description URINE, RANDOM  Final   Special Requests NONE  Final   Culture   Final    50,000 COLONIES/mL DIPHTHEROIDS(CORYNEBACTERIUM SPECIES) Standardized susceptibility testing for this organism is not available.    Report Status 10/13/2015 FINAL  Final       Blood Culture    Component Value Date/Time   SDES URINE, RANDOM 10/11/2015 2011   [place] NONE 10/11/2015 2011   CULT  10/11/2015 2011    50,000 COLONIES/mL DIPHTHEROIDS(CORYNEBACTERIUM SPECIES) Standardized susceptibility testing for this organism is not available.    REPTSTATUS 10/13/2015 FINAL 10/11/2015 2011      Labs: Results for orders placed or performed during the hospital encounter of 10/11/15 (from the past 48 hour(s))  Troponin I (q 6hr x 3)     Status: None   Collection Time: 10/12/15  6:30 PM  Result Value Ref Range   Troponin I <0.03 <0.031 ng/mL    Comment:        NO INDICATION OF MYOCARDIAL INJURY.   Comprehensive metabolic  panel     Status: Abnormal   Collection Time: 10/13/15 12:20 AM  Result Value Ref Range   Sodium 137 135 - 145 mmol/L   Potassium 3.6 3.5 - 5.1 mmol/L   Chloride 102 101 - 111 mmol/L   CO2 22 22 - 32 mmol/L   Glucose, Bld 90 65 - 99 mg/dL   BUN 12 6 - 20 mg/dL   Creatinine, Ser 0.81 0.44 - 1.00 mg/dL   Calcium 9.2 8.9 - 10.3 mg/dL   Total Protein 5.7 (L) 6.5 - 8.1 g/dL   Albumin 3.0 (L) 3.5 - 5.0 g/dL   AST 12 (L) 15 - 41 U/L   ALT 8 (L) 14 - 54 U/L   Alkaline Phosphatase 62 38 -  126 U/L   Total Bilirubin 0.7 0.3 - 1.2 mg/dL   GFR calc non Af Amer >60 >60 mL/min   GFR calc Af Amer >60 >60 mL/min    Comment: (NOTE) The eGFR has been calculated using the CKD EPI equation. This calculation has not been validated in all clinical situations. eGFR's persistently <60 mL/min signify possible Chronic Kidney Disease.    Anion gap 13 5 - 15  CBC     Status: None   Collection Time: 10/13/15 12:20 AM  Result Value Ref Range   WBC 4.6 4.0 - 10.5 K/uL   RBC 4.61 3.87 - 5.11 MIL/uL   Hemoglobin 14.1 12.0 - 15.0 g/dL   HCT 41.5 36.0 - 46.0 %   MCV 90.0 78.0 - 100.0 fL   MCH 30.6 26.0 - 34.0 pg   MCHC 34.0 30.0 - 36.0 g/dL   RDW 13.6 11.5 - 15.5 %   Platelets 242 150 - 400 K/uL  Troponin I (q 6hr x 3)     Status: None   Collection Time: 10/13/15 12:20 AM  Result Value Ref Range   Troponin I <0.03 <0.031 ng/mL    Comment:        NO INDICATION OF MYOCARDIAL INJURY.   Magnesium     Status: None   Collection Time: 10/13/15 12:20 AM  Result Value Ref Range   Magnesium 2.3 1.7 - 2.4 mg/dL     Lipid Panel     Component Value Date/Time   CHOL 198 07/29/2015 1657   TRIG 89 07/29/2015 1657   HDL 57 07/29/2015 1657   CHOLHDL 3.5 07/29/2015 1657   VLDL 18 07/29/2015 1657   LDLCALC 123 07/29/2015 1657     Lab Results  Component Value Date   HGBA1C 5.8* 07/29/2015   HGBA1C 5.7* 04/28/2015   HGBA1C 5.8* 12/05/2014     Lab Results  Component Value Date   MICROALBUR 11.9*  04/28/2015   LDLCALC 123 07/29/2015   CREATININE 0.81 10/13/2015    [name] is a 59 y.o. female with a history of Atrial Fibrillation, HTN, Nephrolithiasis who presents to the ED with complaints of Nausea and Vomiting for the past 6 days since her Kidney stone removal and Ureteral Stent placement on 12/19. She has been taking Phenergan tablets and suppositories without relief. She reports having increased weakness today and having palpitations. She was found to have an elevation in her BUN/CR and hemoconcentration and was referred for admission.Admitted with recurrent urinary tract infection and hematuria  Discharged on 10/07/15 by Kathie Rhodes, MD after percutaneous nephrolithotomy, She underwent her right PCNL without complication or the need for a nephrostomy tube  On 10/06/15 She underwent her right PCNL without complication or the need for a nephrostomy tube. A stent was left indwelling. The stone was completely removed visually and this was confirmed by follow-up CT scan which revealed no residual renal calculi.   Assessment and plan Urinary tract infection/possible pyelonephritis/possible passage of kidney stone Treated aggressively with IV fluids, Rocephin Urine culture shows 50,000 COLONIES/mL DIPHTHEROIDS(CORYNEBACTERIUM SPECIES), nonspecific growth Patient will be continued on Keflex 500 mg PO,QID, because of recent urologic procedure Status post right ureteral stent in satisfactory position on CT scan, multiple left renal calculi evident on CT, Some perinephric stranding around the right kidney   Paroxysmal atrial fibrillation -repleted electrolytes, reduce dose of Cardizem due to soft blood pressure, continue atenolol  in and out of atrial fibrillation, rate controlled, patient off anticoagulation and does not take eliquis  any more, and she needs to discuss further with PCP, this would be okay to resume once hematuria is completely resolved   Dehydration  and nausea-improved, encouraged by mouth intake  Hypertension-controlled, continue atenolol, Cardizem, discontinue triamterene HCTZ, blood pressure soft   Hyperlipidemia-continue atorvastatin   Superficial cellulitis of the right [place] -started on ciprofloxacin-hydrocortisone eardrops      Discharge Exam:    Blood pressure 94/45, pulse 65, temperature 98 F (36.7 C), temperature source Oral, resp. rate 17, height '5\' 2"'$  (1.575 m), weight 76.1 kg (167 lb 12.3 oz), SpO2 100 %.   General: No acute respiratory distress Lungs: Clear to auscultation bilaterally without wheezes or crackles Cardiovascular: Regular rate and rhythm without murmur gallop or rub normal S1 and S2 Abdomen: Nontender, nondistended, soft, bowel sounds positive, no rebound, no ascites, no appreciable mass Extremities: No significant cyanosis, clubbing, or edema bilateral lower extremities       Follow-up Information    Follow up with Alesia Richards, MD. Schedule an appointment as soon as possible for a visit in 3 days.   Specialty:  Internal Medicine   Contact information:   [address] [place] [address] [phone]       Signed: Reyne Dumas 10/14/2015, 6:33 AM        Time spent >45 mins

## 2015-10-17 ENCOUNTER — Ambulatory Visit (INDEPENDENT_AMBULATORY_CARE_PROVIDER_SITE_OTHER): Payer: BC Managed Care – PPO | Admitting: Physician Assistant

## 2015-10-17 ENCOUNTER — Encounter: Payer: Self-pay | Admitting: Physician Assistant

## 2015-10-17 VITALS — BP 122/70 | HR 74 | Temp 97.3°F | Resp 14 | Ht 62.0 in | Wt 186.0 lb

## 2015-10-17 DIAGNOSIS — D649 Anemia, unspecified: Secondary | ICD-10-CM | POA: Diagnosis not present

## 2015-10-17 DIAGNOSIS — N200 Calculus of kidney: Secondary | ICD-10-CM | POA: Diagnosis not present

## 2015-10-17 DIAGNOSIS — E860 Dehydration: Secondary | ICD-10-CM

## 2015-10-17 DIAGNOSIS — N390 Urinary tract infection, site not specified: Secondary | ICD-10-CM | POA: Diagnosis not present

## 2015-10-17 DIAGNOSIS — I480 Paroxysmal atrial fibrillation: Secondary | ICD-10-CM | POA: Diagnosis not present

## 2015-10-17 LAB — CBC WITH DIFFERENTIAL/PLATELET
BASOS PCT: 0 % (ref 0–1)
Basophils Absolute: 0 10*3/uL (ref 0.0–0.1)
Eosinophils Absolute: 0.3 10*3/uL (ref 0.0–0.7)
Eosinophils Relative: 6 % — ABNORMAL HIGH (ref 0–5)
HEMATOCRIT: 37.6 % (ref 36.0–46.0)
HEMOGLOBIN: 12.2 g/dL (ref 12.0–15.0)
LYMPHS ABS: 1.3 10*3/uL (ref 0.7–4.0)
LYMPHS PCT: 29 % (ref 12–46)
MCH: 29.6 pg (ref 26.0–34.0)
MCHC: 32.4 g/dL (ref 30.0–36.0)
MCV: 91.3 fL (ref 78.0–100.0)
MONO ABS: 0.6 10*3/uL (ref 0.1–1.0)
MONOS PCT: 14 % — AB (ref 3–12)
MPV: 9.4 fL (ref 8.6–12.4)
NEUTROS ABS: 2.3 10*3/uL (ref 1.7–7.7)
NEUTROS PCT: 51 % (ref 43–77)
Platelets: 316 10*3/uL (ref 150–400)
RBC: 4.12 MIL/uL (ref 3.87–5.11)
RDW: 14.4 % (ref 11.5–15.5)
WBC: 4.6 10*3/uL (ref 4.0–10.5)

## 2015-10-17 LAB — BASIC METABOLIC PANEL WITH GFR
BUN: 8 mg/dL (ref 7–25)
CHLORIDE: 105 mmol/L (ref 98–110)
CO2: 27 mmol/L (ref 20–31)
CREATININE: 0.7 mg/dL (ref 0.50–0.99)
Calcium: 9.2 mg/dL (ref 8.6–10.4)
GFR, Est African American: >89 mL/min (ref >=60)
GFR, Est Non African American: >89 mL/min (ref >=60)
GLUCOSE: 74 mg/dL (ref 65–99)
Potassium: 4.2 mmol/L (ref 3.5–5.3)
SODIUM: 140 mmol/L (ref 135–146)

## 2015-10-17 LAB — IRON AND TIBC
%SAT: 30 % (ref 11–50)
Iron: 65 ug/dL (ref 45–160)
TIBC: 219 ug/dL — ABNORMAL LOW (ref 250–450)
UIBC: 154 ug/dL (ref 125–400)

## 2015-10-17 LAB — FERRITIN: FERRITIN: 458 ng/mL — AB (ref 10–291)

## 2015-10-17 MED ORDER — FLUCONAZOLE 150 MG PO TABS: 150.0000 mg | ORAL_TABLET | Freq: Every day | ORAL | Status: DC

## 2015-10-17 NOTE — Progress Notes (Signed)
Assessment and Plan: Dehydration due to UTI- recheck BMP, too early to recheck UTI- recheck 1 month Otitis externa- some improvement with cipro drops, if not better may need oral floroquinolone.  ?Anemia- had elevated HCT in the hospital- will recheck with iron, has never had colonoscopy, needs to get discussed with patient and daughter and she is willing to do.  Afib- rate controlled- has follow up with Dr. Meda Coffee 01/06- continue ASA until after that visit, may want to wait until after recollect urine and hematuria has stopped, CHADSVASC 2.    HPI 59 y.o.female presents for follow up from the hospital. Admit date to the hospital was 10/11/15, patient was discharged from the hospital on 10/14/15, patient was admitted for: dehydration with UTI s/p uretheral stone removal and stent placement on 12/19, follows up Jan 3rd. She was treated with rocephin and dced on keflex. In addition while in the hospital she had pAfib, followed by Dr. Meda Coffee, her cardizem was decreased to 240 due to soft BP, atenolol continued, however it was found that she has not been taking her eliquis, she has had hematuria and anemia, she is on iron 325. She has a CHADSVASC of 2, follows up with Dr. Meda Coffee Jan 6th.    Lab Results  Component Value Date   CREATININE 0.81 10/13/2015   BUN 12 10/13/2015   NA 137 10/13/2015   K 3.6 10/13/2015   CL 102 10/13/2015   CO2 22 10/13/2015   Lab Results  Component Value Date   WBC 4.6 10/13/2015   HGB 14.1 10/13/2015   HCT 41.5 10/13/2015   MCV 90.0 10/13/2015   PLT 242 10/13/2015    Blood pressure 122/70, pulse 74, temperature 97.3 F (36.3 C), temperature source Temporal, resp. rate 14, height 5\' 2"  (1.575 m), weight 186 lb (84.369 kg).   Past Medical History  Diagnosis Date  . MRSA (methicillin resistant staph aureus) culture positive   . Eczema   . Hypercholesteremia   . Hypertension   . Kidney stones   . Anemia   . Vitamin D deficiency   . Paroxysmal atrial  fibrillation ([place]) 10/2011, 06/2015  . GERD (gastroesophageal reflux disease)   . Atrial fibrillation ([employer]) 2016  . Dysrhythmia     A FIB - followed by Dr. Meda Coffee     Allergies  Allergen Reactions  . Penicillins Other (See Comments)    Unknown reaction as a child. Has patient had a PCN reaction causing immediate rash, facial/tongue/throat swelling, SOB or lightheadedness with hypotension: No Has patient had a PCN reaction causing severe rash involving mucus membranes or skin necrosis: No Has patient had a PCN reaction that required hospitalization No Has patient had a PCN reaction occurring within the last 10 years: No If all of the above answers are "NO", then may proceed with Cephalosporin use.       Current Outpatient Prescriptions on File Prior to Visit  Medication Sig Dispense Refill  . ascorbic acid (VITAMIN C) 1000 MG tablet Take 1,000 mg by mouth daily.    Marland Kitchen aspirin EC 81 MG tablet Take 1 tablet (81 mg total) by mouth daily. 30 tablet 6  . atenolol (TENORMIN) 100 MG tablet Take 100 mg by mouth every morning.     Marland Kitchen atorvastatin (LIPITOR) 80 MG tablet Take 26.67 mg by mouth See admin instructions. Take 1/3 tablet by mouth three times a week on Tuesday, thursday, and Saturday    . cephALEXin (KEFLEX) 500 MG capsule Take 1 capsule (500 mg total)  by mouth 4 (four) times daily. 28 capsule 0  . cholecalciferol (VITAMIN D) 1000 UNITS tablet Take 4,000 Units by mouth daily.    . ciprofloxacin-hydrocortisone (CIPRO HC OTIC) otic suspension Place 3 drops into the right ear 2 (two) times daily. 10 mL 0  . diltiazem (CARDIZEM CD) 240 MG 24 hr capsule Take 1 capsule (240 mg total) by mouth daily. 30 capsule 0  . doxepin (SINEQUAN) 50 MG capsule Take 1 capsule (50 mg total) by mouth at bedtime. 30 capsule 1  . ferrous sulfate 325 (65 FE) MG tablet Take 325 mg by mouth daily with breakfast.    . PAZEO 0.7 % SOLN Place 1 drop into both eyes daily.    . potassium chloride SA (K-DUR,KLOR-CON)  20 MEQ tablet Take 1 tablet (20 mEq total) by mouth 2 (two) times daily. (Patient taking differently: Take 40 mEq by mouth daily. ) 180 tablet 6  . traMADol (ULTRAM) 50 MG tablet Take 1 tablet (50 mg total) by mouth every 6 (six) hours as needed. (Patient taking differently: Take 50 mg by mouth every 6 (six) hours as needed for moderate pain. ) 15 tablet 0  . triamcinolone cream (KENALOG) 0.1 % Apply 1 application topically as directed. (Patient taking differently: Apply 1 application topically 2 (two) times daily. Apply to affected area for eczema) 30 g 1   No current facility-administered medications on file prior to visit.    ROS: all negative except above.   Physical Exam: Filed Weights   10/17/15 1056  Weight: 186 lb (84.369 kg)   BP 122/70 mmHg  Pulse 74  Temp(Src) 97.3 F (36.3 C) (Temporal)  Resp 14  Ht 5\' 2"  (1.575 m)  Wt 186 lb (84.369 kg)  BMI 34.01 kg/m2 General Appearance: Well nourished, in no apparent distress. Eyes: PERRLA, EOMs, conjunctiva no swelling or erythema Sinuses: No Frontal/maxillary tenderness ENT/Mouth: Ext aud canals clear, TMs without erythema, bulging. No erythema, swelling, or exudate on post pharynx.  Tonsils not swollen or erythematous. Hearing normal.  Neck: Supple, thyroid normal.  Respiratory: Respiratory effort normal, BS equal bilaterally without rales, rhonchi, wheezing or stridor.  Cardio: RRR with no MRGs. Brisk peripheral pulses without edema.  Abdomen: Soft, + BS.  Non tender, no guarding, rebound, hernias, masses. Lymphatics: Non tender without lymphadenopathy.  Musculoskeletal: Full ROM, 5/5 strength, normal gait.  Skin: Warm, dry without rashes, lesions, ecchymosis.  Neuro: Cranial nerves intact. Normal muscle tone, no cerebellar symptoms. Sensation intact.  Psych: Awake and oriented X 3, normal affect, Insight and Judgment appropriate.     Vicie Mutters, PA-C 11:11 AM [facility]

## 2015-10-24 ENCOUNTER — Ambulatory Visit (INDEPENDENT_AMBULATORY_CARE_PROVIDER_SITE_OTHER): Payer: BC Managed Care – PPO | Admitting: Cardiology

## 2015-10-24 ENCOUNTER — Encounter: Payer: Self-pay | Admitting: Cardiology

## 2015-10-24 ENCOUNTER — Encounter: Payer: Self-pay | Admitting: *Deleted

## 2015-10-24 VITALS — BP 120/70 | HR 56 | Ht 62.0 in | Wt 187.0 lb

## 2015-10-24 DIAGNOSIS — I480 Paroxysmal atrial fibrillation: Secondary | ICD-10-CM

## 2015-10-24 DIAGNOSIS — I10 Essential (primary) hypertension: Secondary | ICD-10-CM | POA: Diagnosis not present

## 2015-10-24 DIAGNOSIS — Z7901 Long term (current) use of anticoagulants: Secondary | ICD-10-CM

## 2015-10-24 DIAGNOSIS — Z5181 Encounter for therapeutic drug level monitoring: Secondary | ICD-10-CM

## 2015-10-24 DIAGNOSIS — E785 Hyperlipidemia, unspecified: Secondary | ICD-10-CM | POA: Diagnosis not present

## 2015-10-24 MED ORDER — RIVAROXABAN 20 MG PO TABS: 20.0000 mg | ORAL_TABLET | Freq: Every day | ORAL | Status: DC

## 2015-10-24 MED ORDER — DILTIAZEM HCL ER COATED BEADS 120 MG PO CP24: 120.0000 mg | ORAL_CAPSULE | Freq: Every day | ORAL | Status: AC

## 2015-10-24 NOTE — Patient Instructions (Signed)
Medication Instructions:   STOP TAKING ASPIRIN NOW  DECREASE YOUR CARDIZEM CD (DILTIAZEM) TO 120 MG ONCE DAILY  START TAKING XARELTO 20 MG ONCE DAILY    Follow-Up:  3 MONTHS WITH DR Meda Coffee     If you need a refill on your cardiac medications before your next appointment, please call your pharmacy.

## 2015-10-24 NOTE — Progress Notes (Signed)
Patient ID: [name], female   DOB: [birth year], 60 y.o.   MRN: [id number]    Cardiology Office Note   Date:  10/24/2015   ID:  [name], DOB 1955/12/11, MRN [id number]  Patient Care Team: Unk Pinto, MD as PCP - General (Internal Medicine) Warden Fillers, MD as Consulting Physician (Ophthalmology) Kathie Rhodes, MD as Consulting Physician (Urology) Danella Sensing, MD as Consulting Physician (Dermatology) Alfonso Patten, RN as Registered Nurse Dorothy Spark, MD as Consulting Physician (Cardiology)   Chief complain: PAF   History of Present Illness: [name] is a 60 y.o. female with a hx of HTN, HL, PAF.  Admitted in 9/16 with AF with RVR.  She spontaneously converted to NSR with rate controlling therapy.  She was placed on Apixaban for anticoagulation therapy.  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  She was seen by vascular surgery and referred to GI.  Last seen by Dr. Ena Dawley 08/08/15.  She had been to the ED again with recurrent AF with RVR that spontaneously converted.  This was in the setting of a UTI.  Cardizem dose was increased.  Digoxin was stopped.  Eliquis was changed to Xarelto.  She developed hematuria secondary to nephrolithiasis.  Xarelto was stopped.  She has apparently undergone lithotripsy.    10/24/15 - she has been hospitalized with kidney stones and lithotripsy on 12/19, she was in the hospital for prolonged vomiting, dehydration, PAF, hematuria. Now no bleeding, still on ASA only, no chest pain, DOE, LE edema. No orthopnea, PND. No palpitations or syncope.   Studies/Reports Reviewed Today:  Echo 07/10/15 EF 60-65%, no RWMA  Myoview 10/08/14 Normal stress nuclear study.  LV Ejection Fraction: 61%. LV Wall Motion: NL LV Function; NL Wall  Motion   Past Medical History  Diagnosis Date  . MRSA (methicillin resistant staph aureus) culture positive   . Eczema   . Hypercholesteremia   . Hypertension   . Kidney stones   . Anemia   . Vitamin D deficiency   . Paroxysmal atrial fibrillation ([place]) 10/2011, 06/2015  . GERD (gastroesophageal reflux disease)   . Atrial fibrillation ([place]) 2016  . Dysrhythmia     A FIB - followed by Dr. Meda Coffee    Past Surgical History  Procedure Laterality Date  . Breast surgery Left     biopsy-neg  . Kidney stone surgery  2013  . Nephrolithotomy Right 10/06/2015    Procedure: NEPHROLITHOTOMY PERCUTANEOUS;  Surgeon: Kathie Rhodes, MD;  Location: [facility];  Service: Urology;  Laterality: Right;  . Cystoscopy with holmium laser lithotripsy Right 10/06/2015    Procedure: CYSTOSCOPY WITH HOLMIUM LASER LITHOTRIPSY;  Surgeon: Kathie Rhodes, MD;  Location: [facility];  Service: Urology;  Laterality: Right;     Current Outpatient Prescriptions  Medication Sig Dispense Refill  . ascorbic acid (VITAMIN C) 1000 MG tablet Take 1,000 mg by mouth daily.    Marland Kitchen aspirin EC 81 MG tablet Take 1 tablet (81 mg total) by mouth daily. 30 tablet 6  . atenolol (TENORMIN) 100 MG tablet Take 100 mg by mouth every morning.     Marland Kitchen atorvastatin (LIPITOR) 80 MG tablet Take 26.67 mg by mouth See admin instructions. Take 1/3 tablet by mouth three times a week on Tuesday, thursday, and Saturday    . cephALEXin (KEFLEX) 500 MG capsule  Take 1 capsule (500 mg total) by mouth 4 (four) times daily. 28 capsule 0  . cholecalciferol (VITAMIN D) 1000 UNITS tablet Take 4,000 Units by mouth daily.    . ciprofloxacin-hydrocortisone (CIPRO HC OTIC) otic suspension Place 3 drops into the right ear 2 (two) times daily. 10 mL 0  . diltiazem (DILACOR XR) 180 MG 24 hr capsule Take 180 mg by mouth daily.    Marland Kitchen doxepin (SINEQUAN) 50 MG capsule Take 1 capsule (50 mg total) by mouth at bedtime. 30 capsule 1  . ferrous sulfate 325 (65 FE) MG tablet Take 325  mg by mouth daily with breakfast.    . fluconazole (DIFLUCAN) 150 MG tablet Take 1 tablet (150 mg total) by mouth daily. 1 tablet 3  . PAZEO 0.7 % SOLN Place 1 drop into both eyes daily.    . potassium chloride SA (K-DUR,KLOR-CON) 20 MEQ tablet Take 1 tablet (20 mEq total) by mouth 2 (two) times daily. (Patient taking differently: Take 40 mEq by mouth daily. ) 180 tablet 6  . traMADol (ULTRAM) 50 MG tablet Take 1 tablet (50 mg total) by mouth every 6 (six) hours as needed. (Patient taking differently: Take 50 mg by mouth every 6 (six) hours as needed for moderate pain. ) 15 tablet 0  . triamcinolone cream (KENALOG) 0.1 % Apply 1 application topically as directed. (Patient taking differently: Apply 1 application topically 2 (two) times daily. Apply to affected area for eczema) 30 g 1   No current facility-administered medications for this visit.    Allergies:   Penicillins    Social History:   Social History   Social History  . Marital Status: Widowed    Spouse Name: N/A  . Number of Children: N/A  . Years of Education: N/A   Social History Main Topics  . Smoking status: Never Smoker   . Smokeless tobacco: Never Used  . Alcohol Use: No  . Drug Use: No  . Sexual Activity: Not Asked   Other Topics Concern  . None   Social History Narrative     Family History:   Family History  Problem Relation Age of Onset  . Hypertension Mother   . Hepatitis C Mother   . Heart disease Father   . Dementia Father   . Aneurysm Brother       ROS:   Please see the history of present illness.   Review of Systems  HENT: Positive for headaches.   Cardiovascular: Positive for dyspnea on exertion and leg swelling.  Hematologic/Lymphatic: Bruises/bleeds easily.  Musculoskeletal: Positive for back pain and joint pain.  Gastrointestinal: Positive for abdominal pain.  Genitourinary: Positive for hematuria.  All other systems reviewed and are negative.     PHYSICAL EXAM: VS:  BP 120/70  mmHg  Pulse 56  Ht 5\' 2"  (1.575 m)  Wt 187 lb (84.823 kg)  BMI 34.19 kg/m2  SpO2 99%    Wt Readings from Last 3 Encounters:  10/24/15 187 lb (84.823 kg)  10/17/15 186 lb (84.369 kg)  10/12/15 167 lb 12.3 oz (76.1 kg)     GEN: Well nourished, well developed, in no acute distress HEENT: normal Neck: no JVD,  no masses Cardiac:  Normal S1/S2, RRR; no murmur ,  no rubs or gallops, no edema   Respiratory:  clear to auscultation bilaterally, no wheezing, rhonchi or rales. GI: soft, nontender, nondistended, + BS MS: no deformity or atrophy Skin: warm and dry  Neuro:  CNs II-XII intact, Strength and  sensation are intact Psych: Normal affect   EKG:  EKG is not ordered today.  It demonstrates:   n/a   Recent Labs: 07/29/2015: TSH 0.675 10/13/2015: ALT 8*; Magnesium 2.3 10/17/2015: BUN 8; Creat 0.70; Hemoglobin 12.2; Platelets 316; Potassium 4.2; Sodium 140    Lipid Panel    Component Value Date/Time   CHOL 198 07/29/2015 1657   TRIG 89 07/29/2015 1657   HDL 57 07/29/2015 1657   CHOLHDL 3.5 07/29/2015 1657   VLDL 18 07/29/2015 1657   LDLCALC 123 07/29/2015 1657      ASSESSMENT AND PLAN:  1. PAF:  Maintaining NSR. She is off of anticoagulation secondary to hematuria in the setting of nephrolithiasis.  Her bleeding has stopped since the surgery. CHADS2-VASc=2.  Restart Xarelto, discontinue ASA.  Decrease diltiazem from 180 to 120 mg po daily.  2. HTN:  Controlled.   3. Hyperlipidemia:  Continue statin.    Follow up in 3 months.  Signed, Dorothy Spark, MD 10/24/2015 4:38 PM    [facility] [address], [place], [place]  [address] Phone: [phone]; Fax: [phone]

## 2015-10-27 ENCOUNTER — Telehealth: Payer: Self-pay | Admitting: Cardiology

## 2015-10-27 ENCOUNTER — Other Ambulatory Visit: Payer: Self-pay | Admitting: Internal Medicine

## 2015-10-27 NOTE — Telephone Encounter (Signed)
Pt calling in with complaints of bilateral feet numbness.  Pt has no complaints of chest pain mentioned through conversation.  Pt only states that since she she came in to see Dr Meda Coffee on last Friday, she went back to work and now both of her feet were hurting and are now numb.  Pt has no complaints of chest pain, sob, palpitations, dizziness, or LEE.  Pt only complains of having bilateral feet numbness.  Pt states she has no slurred speech, her walking is normal, and she can ambulate without any difficulties.   Informed the pt that this could be a multitude of things causing her sudden onset of feet numbness.  Pt does not want to drive all the way out here or go to the ER.  Pt would like for our office to advise elsewhere.  Advised the pt that if she has a nearby urgent care, she could defer there for further assistance, or she could call her PCP when they open tomorrow, to schedule a follow-up appt.  Pt states she will refer to Urgent Care near her residence and follow-up with our office on what they say.  Advised the pt that if she becomes symptomatic at all with chest pain, sob, DOE, dizziness, blurred vision, LEE, palpitations, slurred speech, or any other cardiac related symptoms, then she should seek immediate medical attention.  Informed the pt that I will also route this message to Dr Meda Coffee to make her aware of this conversation.  Pt verbalized understanding and agrees with this plan.

## 2015-10-27 NOTE — Telephone Encounter (Signed)
Pt c/o swelling: STAT is pt has developed SOB within 24 hours  Comments: Feet are numb and chest and right arm pain  1. How long have you been experiencing Numbness?  Started last week when she went back to work.. But now bother of her feet are numb. The chest pain is coming and going and arm pain is at night while lying down in the bed,   2. Where is the Numbness located? Feet   3.  Are you currently taking a "fluid pill"? Yes taking all of her medications.   4.  Are you currently SOB? It feels like it, But she isnt sure.   5.  Have you traveled recently? No.    Pt c/o of Chest Pain: 1. Are you having CP right now? No  2. Are you experiencing any other symptoms (ex. SOB, nausea, vomiting, sweating)? Sometimes it feels like she wants to vomit.  3. How long have you been experiencing CP? Chest pain comes and goes but the arm pain is only at night. ( The right arm is heavy. Cannot really explain it)  4. Is your CP continuous or coming and going? Coming and going  5. Have you taken Nitroglycerin? No

## 2015-10-28 ENCOUNTER — Ambulatory Visit (INDEPENDENT_AMBULATORY_CARE_PROVIDER_SITE_OTHER): Payer: BC Managed Care – PPO | Admitting: Internal Medicine

## 2015-10-28 ENCOUNTER — Encounter: Payer: Self-pay | Admitting: Internal Medicine

## 2015-10-28 VITALS — BP 108/70 | HR 64 | Temp 98.0°F | Resp 16 | Ht 62.0 in | Wt 176.0 lb

## 2015-10-28 DIAGNOSIS — R202 Paresthesia of skin: Secondary | ICD-10-CM

## 2015-10-28 DIAGNOSIS — R200 Anesthesia of skin: Secondary | ICD-10-CM | POA: Diagnosis not present

## 2015-10-28 LAB — CBC WITH DIFFERENTIAL/PLATELET
BASOS ABS: 0.1 10*3/uL (ref 0.0–0.1)
BASOS PCT: 1 % (ref 0–1)
EOS ABS: 0.3 10*3/uL (ref 0.0–0.7)
Eosinophils Relative: 6 % — ABNORMAL HIGH (ref 0–5)
HCT: 41.4 % (ref 36.0–46.0)
Hemoglobin: 13.7 g/dL (ref 12.0–15.0)
Lymphocytes Relative: 25 % (ref 12–46)
Lymphs Abs: 1.4 10*3/uL (ref 0.7–4.0)
MCH: 30 pg (ref 26.0–34.0)
MCHC: 33.1 g/dL (ref 30.0–36.0)
MCV: 90.6 fL (ref 78.0–100.0)
MPV: 9.5 fL (ref 8.6–12.4)
Monocytes Absolute: 0.7 10*3/uL (ref 0.1–1.0)
Monocytes Relative: 13 % — ABNORMAL HIGH (ref 3–12)
NEUTROS PCT: 55 % (ref 43–77)
Neutro Abs: 3 10*3/uL (ref 1.7–7.7)
Platelets: 349 10*3/uL (ref 150–400)
RBC: 4.57 MIL/uL (ref 3.87–5.11)
RDW: 15.3 % (ref 11.5–15.5)
WBC: 5.4 10*3/uL (ref 4.0–10.5)

## 2015-10-28 LAB — IRON AND TIBC
%SAT: 25 % (ref 11–50)
Iron: 60 ug/dL (ref 45–160)
TIBC: 243 ug/dL — ABNORMAL LOW (ref 250–450)
UIBC: 183 ug/dL (ref 125–400)

## 2015-10-28 LAB — VITAMIN B12: Vitamin B-12: 523 pg/mL (ref 211–911)

## 2015-10-28 LAB — BASIC METABOLIC PANEL WITH GFR
BUN: 16 mg/dL (ref 7–25)
CALCIUM: 10.3 mg/dL (ref 8.6–10.4)
CO2: 27 mmol/L (ref 20–31)
CREATININE: 0.8 mg/dL (ref 0.50–0.99)
Chloride: 103 mmol/L (ref 98–110)
GFR, Est African American: >89 mL/min (ref >=60)
GFR, Est Non African American: 80 mL/min (ref >=60)
GLUCOSE: 75 mg/dL (ref 65–99)
Potassium: 4.6 mmol/L (ref 3.5–5.3)
Sodium: 141 mmol/L (ref 135–146)

## 2015-10-28 MED ORDER — DEXAMETHASONE SODIUM PHOSPHATE 100 MG/10ML IJ SOLN
10.0000 mg | Freq: Once | INTRAMUSCULAR | Status: AC
  Administered 2015-10-28: 10 mg via INTRAMUSCULAR

## 2015-10-28 NOTE — Progress Notes (Signed)
   Subjective:    Patient ID: [name], female    DOB: 08/27/1956, 60 y.o.   MRN: [id number]  HPI  Patient presents to the office for evaluation of bilateral feet numbness which started last week. She reports that it started with the right foot and then went to the left foot.  She reports that the numbness gets worse with being on her feet.  She does admit to some low back pain sometimes but not all the time.  She has never had numbness in her feet in the past.  Nothing makes the numbness go away. She reports the numbness is mostly in both big toes. No new shoes or changes recently.  She has been wearing her boots.  Bilateral legs feel very heavy.  Review of Systems  Constitutional: Negative for fever, chills and fatigue.  Respiratory: Negative for chest tightness and shortness of breath.   Cardiovascular: Negative for chest pain, palpitations and leg swelling.  Musculoskeletal: Negative for back pain.  Neurological: Positive for numbness. Negative for dizziness and weakness.       Objective:   Physical Exam  Constitutional: She is oriented to person, place, and time. She appears well-developed and well-nourished. No distress.  HENT:  Head: Normocephalic.  Mouth/Throat: Oropharynx is clear and moist. No oropharyngeal exudate.  Eyes: Conjunctivae are normal. No scleral icterus.  Neck: Normal range of motion. Neck supple. No JVD present. No thyromegaly present.  Cardiovascular: Normal rate, regular rhythm, normal heart sounds and intact distal pulses.  Exam reveals no gallop and no friction rub.   No murmur heard. Pulses:      Dorsalis pedis pulses are 1+ on the right side, and 1+ on the left side.       Posterior tibial pulses are 1+ on the right side, and 1+ on the left side.  Normal cap refill bilateral big toes.  Negative holmans sign.    Pulmonary/Chest: Effort normal and breath sounds normal. No respiratory distress. She has no wheezes. She has no rales. She exhibits no  tenderness.  Abdominal: Soft. Bowel sounds are normal. She exhibits no distension and no mass. There is no tenderness. There is no rebound and no guarding.  Musculoskeletal: Normal range of motion.       Feet:  Sensation to light touch appears to be intact.  Lymphadenopathy:    She has no cervical adenopathy.  Neurological: She is alert and oriented to person, place, and time.  Skin: Skin is warm and dry. Rash noted. She is not diaphoretic.  Nursing note and vitals reviewed.    Filed Vitals:   10/28/15 1421  BP: 108/70  Pulse: 64  Temp: 98 F (36.7 C)  Resp: 16          Assessment & Plan:    1. Numbness and tingling of both legs -decadron shot - CBC with Differential/Platelet - BASIC METABOLIC PANEL WITH GFR - Iron and TIBC - Vitamin B12

## 2015-10-28 NOTE — Addendum Note (Signed)
Addended by: Ioanna Colquhoun A on: 10/28/2015 03:07 PM   Modules accepted: Orders

## 2015-10-28 NOTE — Telephone Encounter (Signed)
Pt saw her PCP today and was diagnosed and treated for bilateral feet numbness, rash, and tingling.  Multitude of labs were drawn at this visit too.  No cardiac issues noted in PCP assessment and plan.

## 2015-10-29 ENCOUNTER — Telehealth: Payer: Self-pay | Admitting: Cardiology

## 2015-10-29 ENCOUNTER — Ambulatory Visit: Payer: BC Managed Care – PPO | Admitting: Cardiology

## 2015-10-29 NOTE — Telephone Encounter (Signed)
New message     FYI Calling to let the nurse know that she saw her PCP regarding the numbness in her feet. (she called our nurse and was to call back to give an update)  He gave her a shot in her hip and drew blood.  The PCP told pt it was not her heart.

## 2015-10-29 NOTE — Telephone Encounter (Signed)
Will route this information to Dr Meda Coffee as an Juluis Rainier.

## 2015-11-07 ENCOUNTER — Telehealth: Payer: Self-pay | Admitting: Cardiology

## 2015-11-07 NOTE — Telephone Encounter (Signed)
New message      Pt c/o medication issue:  1. Name of Medication:xarelto 2. How are you currently taking this medication (dosage and times per day)? 20mg  3. Are you having a reaction (difficulty breathing--STAT)? no 4. What is your medication issue? Pt has blood in her urine.  Please advise

## 2015-11-07 NOTE — Telephone Encounter (Signed)
Pt calling in to the office to ask if she should stop taking her Xarelto, for noted blood in her urine.  Pt states she noticed blood in her urine this morning, as well as a foul smell.  Pt has states she has no complaints of pain in her abdomen or pelvic area.  Noted that the pt has a significant history of UTIs and kidney stones, and is closely being followed by a Nephrologist.  I advised the pt that she should call her Nephrologist right now, and inform them of her complaints.  Informed the pt that her Nephrologist could test her urine and also advise on stopping Xarelto, if needed.  Per the pt she states she will call her Nephrologist on Monday, for she states "I don't feel like calling them today." Highly advised the pt again to call her Nephrologist, for this is the appropriate MD to advise on her current complaints.  Advised the pt that if she doesn't want to call her Nephrologist who frequently treats her for this issue, then she could go to a urgent care or ER.  Per the pt, she states she doesn't want to go to either, and she will call the nephrology on Monday.  Advised the pt to continue her Xarelto, until otherwise advised by recommended providers mentioned above.  Pt verbalized understanding and agrees with this plan.  Will route this message to Dr Meda Coffee as an Juluis Rainier.

## 2015-11-09 ENCOUNTER — Other Ambulatory Visit: Payer: Self-pay | Admitting: Internal Medicine

## 2015-11-14 ENCOUNTER — Other Ambulatory Visit: Payer: BC Managed Care – PPO

## 2015-11-14 DIAGNOSIS — N390 Urinary tract infection, site not specified: Secondary | ICD-10-CM

## 2015-11-14 LAB — URINALYSIS, MICROSCOPIC ONLY
Bacteria, UA: NONE SEEN [HPF]
CASTS: NONE SEEN [LPF]
Crystals: NONE SEEN [HPF]
RBC / HPF: NONE SEEN RBC/HPF (ref <=2)
Squamous Epithelial / LPF: NONE SEEN [HPF] (ref <=5)
Yeast: NONE SEEN [HPF]

## 2015-11-14 LAB — URINALYSIS, ROUTINE W REFLEX MICROSCOPIC
BILIRUBIN URINE: NEGATIVE
Glucose, UA: NEGATIVE
KETONES UR: NEGATIVE
NITRITE: NEGATIVE
PH: 6.5 (ref 5.0–8.0)
Protein, ur: NEGATIVE
Specific Gravity, Urine: 1.007 (ref 1.001–1.035)

## 2015-11-15 LAB — URINE CULTURE
Colony Count: NO GROWTH
ORGANISM ID, BACTERIA: NO GROWTH

## 2015-11-24 ENCOUNTER — Encounter: Payer: Self-pay | Admitting: Internal Medicine

## 2015-11-25 ENCOUNTER — Other Ambulatory Visit: Payer: Self-pay

## 2015-11-25 DIAGNOSIS — Z1231 Encounter for screening mammogram for malignant neoplasm of breast: Secondary | ICD-10-CM

## 2015-11-27 ENCOUNTER — Other Ambulatory Visit: Payer: Self-pay | Admitting: Internal Medicine

## 2015-11-27 DIAGNOSIS — N6310 Unspecified lump in the right breast, unspecified quadrant: Secondary | ICD-10-CM

## 2015-12-02 ENCOUNTER — Telehealth: Payer: Self-pay | Admitting: Cardiology

## 2015-12-02 NOTE — Telephone Encounter (Signed)
Pt Urinologist put her on medicine,she wants to know if it will interfere with her Xarelto.

## 2015-12-02 NOTE — Telephone Encounter (Signed)
Pt calling to ask Dr Meda Coffee if taking Magnesium 400 mg po daily and Citracal 200 mg daily will interfere with her Xarelto.  Pt states her Urologist prescribed both of these meds to her.  Informed the pt that I spoke with Dr Meda Coffee, and both of this meds are safe while taking Xarelto.  Pt verbalized understanding and agrees with this plan.

## 2015-12-03 ENCOUNTER — Other Ambulatory Visit: Payer: Self-pay | Admitting: Internal Medicine

## 2015-12-03 ENCOUNTER — Other Ambulatory Visit: Payer: BC Managed Care – PPO

## 2015-12-03 ENCOUNTER — Telehealth: Payer: Self-pay | Admitting: Cardiology
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
## 2015-12-05 ENCOUNTER — Ambulatory Visit
Admission: RE | Admit: 2015-12-05 | Discharge: 2015-12-05 | Disposition: A | Discharge status: Home / Self Care | Payer: BC Managed Care – PPO | Source: Ambulatory Visit | Attending: Internal Medicine | Admitting: Internal Medicine

## 2015-12-05 ENCOUNTER — Other Ambulatory Visit: Payer: Self-pay | Admitting: Internal Medicine

## 2015-12-05 DIAGNOSIS — N6310 Unspecified lump in the right breast, unspecified quadrant: Secondary | ICD-10-CM

## 2015-12-05 DIAGNOSIS — R599 Enlarged lymph nodes, unspecified: Secondary | ICD-10-CM

## 2015-12-08 ENCOUNTER — Ambulatory Visit (INDEPENDENT_AMBULATORY_CARE_PROVIDER_SITE_OTHER): Payer: BC Managed Care – PPO | Admitting: Physician Assistant

## 2015-12-08 ENCOUNTER — Other Ambulatory Visit: Payer: Self-pay

## 2015-12-08 ENCOUNTER — Encounter: Payer: Self-pay | Admitting: Physician Assistant

## 2015-12-08 VITALS — BP 118/70 | HR 90 | Temp 97.7°F | Resp 14 | Ht 62.0 in | Wt 161.0 lb

## 2015-12-08 DIAGNOSIS — J0100 Acute maxillary sinusitis, unspecified: Secondary | ICD-10-CM | POA: Diagnosis not present

## 2015-12-08 MED ORDER — NYSTATIN 100000 UNIT/ML MT SUSP: OROMUCOSAL | Status: DC

## 2015-12-08 MED ORDER — AZELASTINE HCL 0.1 % NA SOLN: 2.0000 | Freq: Two times a day (BID) | NASAL | Status: DC

## 2015-12-08 MED ORDER — PROMETHAZINE-DM 6.25-15 MG/5ML PO SYRP: ORAL_SOLUTION | ORAL | Status: DC

## 2015-12-08 MED ORDER — DEXAMETHASONE SODIUM PHOSPHATE 100 MG/10ML IJ SOLN
10.0000 mg | Freq: Once | INTRAMUSCULAR | Status: AC
  Administered 2015-12-08: 10 mg via INTRAMUSCULAR

## 2015-12-08 MED ORDER — AZITHROMYCIN 250 MG PO TABS: ORAL_TABLET | ORAL | Status: AC

## 2015-12-08 NOTE — Progress Notes (Signed)
Subjective:    Patient ID: [name], female    DOB: 09/08/1956, 60 y.o.   MRN: [id number]  HPI 60 y.o. AA female with history of Afib presents with cold symptoms x Wednesday of last week. She has laryngitits, she has green mucus with nasal congestion, with fever/chills last night, myalgias, she has productive cough with white mucus. Denies SOB, CP. She is getting biopsy of her right breast on Wednesday.  Blood pressure 118/70, pulse 90, temperature 97.7 F (36.5 C), resp. rate 14, height 5\' 2"  (1.575 m), weight 161 lb (73.029 kg), SpO2 97 %.  Current Outpatient Prescriptions on File Prior to Visit  Medication Sig Dispense Refill  . ascorbic acid (VITAMIN C) 1000 MG tablet Take 1,000 mg by mouth daily.    Marland Kitchen atenolol (TENORMIN) 100 MG tablet take 1/2 to 1 tablet by mouth once daily as directed 90 tablet 0  . atorvastatin (LIPITOR) 80 MG tablet Take 26.67 mg by mouth See admin instructions. Take 1/3 tablet by mouth three times a week on Tuesday, thursday, and Saturday    . cholecalciferol (VITAMIN D) 1000 UNITS tablet Take 4,000 Units by mouth daily.    . ciprofloxacin-hydrocortisone (CIPRO HC OTIC) otic suspension Place 3 drops into the right ear 2 (two) times daily. 10 mL 0  . diltiazem (CARDIZEM CD) 120 MG 24 hr capsule Take 1 capsule (120 mg total) by mouth daily. 90 capsule 3  . doxepin (SINEQUAN) 50 MG capsule take 1 capsule by mouth once daily at bedtime 30 capsule 1  . ferrous sulfate 325 (65 FE) MG tablet Take 325 mg by mouth daily with breakfast.    . fluconazole (DIFLUCAN) 150 MG tablet Take 1 tablet (150 mg total) by mouth daily. 1 tablet 3  . PAZEO 0.7 % SOLN Place 1 drop into both eyes daily.    . potassium chloride SA (K-DUR,KLOR-CON) 20 MEQ tablet Take 1 tablet (20 mEq total) by mouth 2 (two) times daily. (Patient taking differently: Take 40 mEq by mouth daily. ) 180 tablet 6  . rivaroxaban (XARELTO) 20 MG TABS tablet Take 1 tablet (20 mg total) by mouth daily with  supper. 90 tablet 3  . traMADol (ULTRAM) 50 MG tablet Take 1 tablet (50 mg total) by mouth every 6 (six) hours as needed. (Patient taking differently: Take 50 mg by mouth every 6 (six) hours as needed for moderate pain. ) 15 tablet 0  . triamcinolone cream (KENALOG) 0.1 % Apply 1 application topically as directed. (Patient taking differently: Apply 1 application topically 2 (two) times daily. Apply to affected area for eczema) 30 g 1   No current facility-administered medications on file prior to visit.   Past Medical History  Diagnosis Date  . MRSA (methicillin resistant staph aureus) culture positive   . Eczema   . Hypercholesteremia   . Hypertension   . Kidney stones   . Anemia   . Vitamin D deficiency   . Paroxysmal atrial fibrillation ([place]) 10/2011, 06/2015  . GERD (gastroesophageal reflux disease)   . Atrial fibrillation ([place]) 2016  . Dysrhythmia     A FIB - followed by Dr. Meda Coffee   Review of Systems  Constitutional: Positive for fever and chills. Negative for diaphoresis and fatigue.  HENT: Positive for congestion, postnasal drip, sinus pressure, sneezing and voice change. Negative for ear discharge, ear pain, sore throat, tinnitus and trouble swallowing.   Respiratory: Positive for cough. Negative for chest tightness, shortness of breath and wheezing.  Cardiovascular: Negative.   Gastrointestinal: Negative.   Genitourinary: Negative.   Musculoskeletal: Negative for neck pain.  Neurological: Positive for headaches.       Objective:   Physical Exam  Constitutional: She is oriented to person, place, and time. She appears well-developed and well-nourished.  HENT:  Right Ear: Hearing and external ear normal. No mastoid tenderness. Tympanic membrane is not injected, not perforated, not erythematous, not retracted and not bulging. A middle ear effusion is present.  Left Ear: Hearing and external ear normal. No mastoid tenderness. Tympanic membrane is not injected, not  perforated, not erythematous, not retracted and not bulging. A middle ear effusion is present.  Nose: Right sinus exhibits maxillary sinus tenderness. Left sinus exhibits maxillary sinus tenderness.  Mouth/Throat: Uvula is midline, oropharynx is clear and moist and mucous membranes are normal. No trismus in the jaw. No tonsillar abscesses.  Bilateral external ear canals clear without drainage/erythema.   Eyes: Conjunctivae and EOM are normal. Pupils are equal, round, and reactive to light.  Neck: Neck supple.  Cardiovascular: Normal rate and regular rhythm.   Pulmonary/Chest: Effort normal and breath sounds normal. No respiratory distress. She has no wheezes.  Abdominal: Soft. Bowel sounds are normal.  Musculoskeletal: Normal range of motion.  Lymphadenopathy:    She has no cervical adenopathy.  Neurological: She is alert and oriented to person, place, and time.  Skin: Skin is warm and dry.      Assessment & Plan:  1. Acute maxillary sinusitis, recurrence not specified - azithromycin (ZITHROMAX) 250 MG tablet; Take 2 tablets (500 mg) on  Day 1,  followed by 1 tablet (250 mg) once daily on Days 2 through 5.  Dispense: 6 each; Refill: 1 - azelastine (ASTELIN) 0.1 % nasal spray; Place 2 sprays into both nostrils 2 (two) times daily. Use in each nostril as directed  Dispense: 30 mL; Refill: 2 - promethazine-dextromethorphan (PROMETHAZINE-DM) 6.25-15 MG/5ML syrup; Take 5 ml by mouth for cough every 6 hours as needed  Dispense: 240 mL; Refill: 1 - nystatin (MYCOSTATIN) 100000 UNIT/ML suspension; Take 5 ml by mouth three times a day. Retain in mouth as long as possible (Swish and Spit).  Use for 48 hours after symptoms resolve.  Dispense: 240 mL; Refill: 0 - dexamethasone (DECADRON) injection 10 mg; Inject 1 mL (10 mg total) into the muscle once.

## 2015-12-08 NOTE — Patient Instructions (Signed)
HOW TO TREAT VIRAL COUGH AND COLD SYMPTOMS:  -Symptoms usually last at least 1 week with the worst symptoms being around day 4.  - colds usually start with a sore throat and end with a cough, and the cough can take 2 weeks to get better.  -No antibiotics are needed for colds, flu, sore throats, cough, bronchitis UNLESS symptoms are longer than 7 days OR if you are getting better then get drastically worse.  -There are a lot of combination medications (Dayquil, Nyquil, Vicks 44, tyelnol cold and sinus, ETC). Please look at the ingredients on the back so that you are treating the correct symptoms and not doubling up on medications/ingredients.    Medicines you can use  Nasal congestion  -Dextormethorphan + chlorpheniramine (Coridcidin HBP)- okay if you have high blood pressure -Oxymetazoline (Afrin) nasal spray- LIMIT to 3 days -Saline nasal spray -Neti pot (used distilled or bottled water)  Ear pain/congestion  - Nasonex/flonase nasal spray  Fever  -Acetaminophen (Tyelnol)  Sore Throat  -Acetaminophen (Tyelnol) -Drink a lot of water -Gargle with salt water - Rest your voice (don't talk) -Throat sprays -Cough drops  Body Aches  -Acetaminophen (Tyelnol)  Headache  -Acetaminophen (Tyelnol)  Allergy symptoms (cough, sneeze, runny nose, itchy eyes) -Claritin or loratadine cheapest but likely the weakest  -Zyrtec or certizine at night because it can make you sleepy -The strongest is allegra or fexafinadine  Cheapest at [employer], [employer], [employer]  Cough  -Dextromethorphan (Delsym)- medicine that has DM in it -Guafenesin (Mucinex/Robitussin) - cough drops - drink lots of water  Chest Congestion  -Guafenesin (Mucinex/Robitussin)  Red Itchy Eyes  - Naphcon-A  Upset Stomach  - Bland diet (nothing spicy, greasy, fried, and high acid foods like tomatoes, oranges, berries) -OKAY- cereal, bread, soup, crackers, rice -Eat smaller more frequent meals -reduce caffeine, no  alcohol -Loperamide (Imodium-AD) if diarrhea -Prevacid for heart burn  General health when sick  -Hydration -wash your hands frequently -keep surfaces clean -change pillow cases and sheets often -Get fresh air but do not exercise strenuously -Vitamin D, double up on it - Vitamin C -Zinc

## 2015-12-10 ENCOUNTER — Ambulatory Visit
Admission: RE | Admit: 2015-12-10 | Discharge: 2015-12-10 | Disposition: A | Discharge status: Home / Self Care | Payer: BC Managed Care – PPO | Source: Ambulatory Visit | Attending: Internal Medicine | Admitting: Internal Medicine

## 2015-12-10 ENCOUNTER — Other Ambulatory Visit: Payer: BC Managed Care – PPO

## 2015-12-10 DIAGNOSIS — N6310 Unspecified lump in the right breast, unspecified quadrant: Secondary | ICD-10-CM

## 2015-12-11 ENCOUNTER — Other Ambulatory Visit: Payer: Self-pay | Admitting: Internal Medicine

## 2015-12-12 ENCOUNTER — Telehealth: Payer: Self-pay | Admitting: *Deleted

## 2015-12-12 ENCOUNTER — Encounter: Payer: Self-pay | Admitting: *Deleted

## 2015-12-12 DIAGNOSIS — C50411 Malignant neoplasm of upper-outer quadrant of right female breast: Secondary | ICD-10-CM | POA: Insufficient documentation

## 2015-12-12 HISTORY — DX: Malignant neoplasm of upper-outer quadrant of right female breast: C50.411

## 2015-12-12 NOTE — Telephone Encounter (Signed)
error 

## 2015-12-12 NOTE — Telephone Encounter (Signed)
Confirmed [facility] for 12/17/15 at 0815.  Instructions and contact information given.

## 2015-12-15 ENCOUNTER — Ambulatory Visit (INDEPENDENT_AMBULATORY_CARE_PROVIDER_SITE_OTHER): Payer: BC Managed Care – PPO | Admitting: Internal Medicine

## 2015-12-15 ENCOUNTER — Encounter: Payer: Self-pay | Admitting: Internal Medicine

## 2015-12-15 ENCOUNTER — Telehealth: Payer: Self-pay | Admitting: *Deleted

## 2015-12-15 VITALS — BP 122/70 | HR 80 | Temp 98.2°F | Resp 16 | Ht 62.0 in

## 2015-12-15 DIAGNOSIS — C50411 Malignant neoplasm of upper-outer quadrant of right female breast: Secondary | ICD-10-CM

## 2015-12-15 MED ORDER — DIAZEPAM 5 MG PO TABS: 5.0000 mg | ORAL_TABLET | Freq: Two times a day (BID) | ORAL | Status: DC | PRN

## 2015-12-15 MED ORDER — TRIAMTERENE-HCTZ 37.5-25 MG PO TABS: 1.0000 | ORAL_TABLET | Freq: Every day | ORAL | Status: DC

## 2015-12-15 NOTE — Telephone Encounter (Signed)
Pt was unaware she was to stop the Maxzide on dc from hosptial 10/14/15. She continued to take it. She states she has appointment with her PCP and will check with him about restarting it.  She was just diagnosed with breast cancer and will be needing surgery.

## 2015-12-15 NOTE — Telephone Encounter (Signed)
Patient left a voicemail on the refill line requesting a refill on maxide. It looks like the patient was supposed to stop taking this medication at 10/14/15 hospital discharge. She stated in the message that she did not have a pill all weekend which indicates that she never stopped the medication. It was added back onto her med list at her recent office visit with Vicie Mutters PA. Please advise on request. Thanks, MI

## 2015-12-16 NOTE — Progress Notes (Signed)
Patient ID: [name], female   DOB: 09/19/1956, 60 y.o.   MRN: [id number] Patient presents to the office today for conversation after recent diagnosis of breast cancer.  She reports that since the diagnosis she has not been sleeping well and is worried about her own personal health.  She is due to see the multidisciplinary committee on Wednesday.  I told her the likely steps of meeting with general surgery, oncology, radiation oncology and the staff at the cancer center.  I also reiterated that we would be available for any support and to answer questions for her as needed.  I did give her a prescription for valium 5 mg tablets to take at bedtime as needed for sleep and anxiety.  She will call with any questions or concerns.  Both she and her daughter felt much better after our conversation and will be in touch with [place].

## 2015-12-17 ENCOUNTER — Other Ambulatory Visit: Payer: Self-pay | Admitting: *Deleted

## 2015-12-17 ENCOUNTER — Encounter: Payer: Self-pay | Admitting: Hematology and Oncology

## 2015-12-17 ENCOUNTER — Ambulatory Visit: Payer: BC Managed Care – PPO | Attending: General Surgery | Admitting: Physical Therapy

## 2015-12-17 ENCOUNTER — Other Ambulatory Visit: Payer: BC Managed Care – PPO

## 2015-12-17 ENCOUNTER — Ambulatory Visit (HOSPITAL_BASED_OUTPATIENT_CLINIC_OR_DEPARTMENT_OTHER): Payer: BC Managed Care – PPO | Admitting: Hematology and Oncology

## 2015-12-17 ENCOUNTER — Encounter: Payer: Self-pay | Admitting: Physical Therapy

## 2015-12-17 ENCOUNTER — Other Ambulatory Visit (HOSPITAL_BASED_OUTPATIENT_CLINIC_OR_DEPARTMENT_OTHER): Payer: BC Managed Care – PPO

## 2015-12-17 ENCOUNTER — Other Ambulatory Visit: Payer: Self-pay | Admitting: General Surgery

## 2015-12-17 ENCOUNTER — Ambulatory Visit
Admission: RE | Admit: 2015-12-17 | Discharge: 2015-12-17 | Disposition: A | Discharge status: Home / Self Care | Payer: BC Managed Care – PPO | Source: Ambulatory Visit | Attending: Hematology and Oncology | Admitting: Hematology and Oncology

## 2015-12-17 ENCOUNTER — Encounter: Payer: Self-pay | Admitting: Skilled Nursing Facility1

## 2015-12-17 ENCOUNTER — Ambulatory Visit
Admission: RE | Admit: 2015-12-17 | Discharge: 2015-12-17 | Disposition: A | Discharge status: Home / Self Care | Payer: BC Managed Care – PPO | Source: Ambulatory Visit | Attending: Radiation Oncology | Admitting: Radiation Oncology

## 2015-12-17 ENCOUNTER — Encounter: Payer: Self-pay | Admitting: *Deleted

## 2015-12-17 ENCOUNTER — Encounter: Payer: Self-pay | Admitting: Nurse Practitioner

## 2015-12-17 ENCOUNTER — Encounter: Payer: Self-pay | Admitting: Internal Medicine

## 2015-12-17 VITALS — BP 116/74 | HR 91 | Temp 98.3°F | Resp 18 | Ht 62.0 in | Wt 176.7 lb

## 2015-12-17 DIAGNOSIS — C50411 Malignant neoplasm of upper-outer quadrant of right female breast: Secondary | ICD-10-CM

## 2015-12-17 DIAGNOSIS — R293 Abnormal posture: Secondary | ICD-10-CM | POA: Diagnosis present

## 2015-12-17 LAB — CBC WITH DIFFERENTIAL/PLATELET
BASO%: 0.9 % (ref 0.0–2.0)
BASOS ABS: 0 10*3/uL (ref 0.0–0.1)
EOS ABS: 0.3 10*3/uL (ref 0.0–0.5)
EOS%: 7.2 % — ABNORMAL HIGH (ref 0.0–7.0)
HCT: 41.9 % (ref 34.8–46.6)
HEMOGLOBIN: 13.8 g/dL (ref 11.6–15.9)
LYMPH%: 39.4 % (ref 14.0–49.7)
MCH: 30.2 pg (ref 25.1–34.0)
MCHC: 32.9 g/dL (ref 31.5–36.0)
MCV: 91.7 fL (ref 79.5–101.0)
MONO#: 0.4 10*3/uL (ref 0.1–0.9)
MONO%: 12.6 % (ref 0.0–14.0)
NEUT%: 39.9 % (ref 38.4–76.8)
NEUTROS ABS: 1.4 10*3/uL — AB (ref 1.5–6.5)
PLATELETS: 224 10*3/uL (ref 145–400)
RBC: 4.57 10*6/uL (ref 3.70–5.45)
RDW: 14 % (ref 11.2–14.5)
WBC: 3.5 10*3/uL — AB (ref 3.9–10.3)
lymph#: 1.4 10*3/uL (ref 0.9–3.3)

## 2015-12-17 LAB — COMPREHENSIVE METABOLIC PANEL
ALBUMIN: 3.4 g/dL — AB (ref 3.5–5.0)
ALK PHOS: 72 U/L (ref 40–150)
ALT: <9 U/L (ref 0–55)
ANION GAP: 10 meq/L (ref 3–11)
AST: 14 U/L (ref 5–34)
BUN: 6.5 mg/dL — ABNORMAL LOW (ref 7.0–26.0)
CO2: 27 meq/L (ref 22–29)
Calcium: 9.4 mg/dL (ref 8.4–10.4)
Chloride: 103 mEq/L (ref 98–109)
Creatinine: 0.9 mg/dL (ref 0.6–1.1)
EGFR: 86 mL/min/{1.73_m2} — AB (ref >90)
GLUCOSE: 85 mg/dL (ref 70–140)
POTASSIUM: 3.8 meq/L (ref 3.5–5.1)
SODIUM: 139 meq/L (ref 136–145)
TOTAL PROTEIN: 6.9 g/dL (ref 6.4–8.3)
Total Bilirubin: 0.39 mg/dL (ref 0.20–1.20)

## 2015-12-17 MED ORDER — PROCHLORPERAZINE MALEATE 10 MG PO TABS: 10.0000 mg | ORAL_TABLET | Freq: Four times a day (QID) | ORAL | Status: DC | PRN

## 2015-12-17 MED ORDER — LIDOCAINE-PRILOCAINE 2.5-2.5 % EX CREA: TOPICAL_CREAM | CUTANEOUS | Status: DC

## 2015-12-17 MED ORDER — LORAZEPAM 0.5 MG PO TABS
0.5000 mg | ORAL_TABLET | Freq: Every day | ORAL | Status: DC
Start: 2015-12-17 — End: 2016-01-19

## 2015-12-17 MED ORDER — ONDANSETRON HCL 8 MG PO TABS: 8.0000 mg | ORAL_TABLET | Freq: Two times a day (BID) | ORAL | Status: DC | PRN

## 2015-12-17 MED ORDER — DEXAMETHASONE 4 MG PO TABS: ORAL_TABLET | ORAL | Status: DC

## 2015-12-17 MED ORDER — GADOBENATE DIMEGLUMINE 529 MG/ML IV SOLN
16.0000 mL | Freq: Once | INTRAVENOUS | Status: AC | PRN
  Administered 2015-12-17: 16 mL via INTRAVENOUS

## 2015-12-17 NOTE — Progress Notes (Signed)
Subjective:     Patient ID: [name], female   DOB: 19-Sep-1956, 60 y.o.   MRN: [id number]  HPI   Review of Systems     Objective:   Physical Exam For the patient to understand and be given the tools to implement a healthy plant based diet during their cancer diagnosis.     Assessment:     Patient was seen today and found to be pleasant and supported by her daughters. Pts ht 5'2'', 176 pounds, BMI 32.4. Pts labs: BUN 6.5, WBC 3.5. Pts medications: vitamin D, ferrous sulfate, vitamin C, lipitor. Pt has A-fib, hypertension. Pts daughter vehemently stated to her mother that will not be changing her diet (she lives with her) but her other daughter seemed more supportive. Pt listened well.     Plan:     Dietitian educated the patient on implementing a plant based diet by incorporating more plant proteins, fruits, and vegetables. As a part of a healthy routine physical activity was discussed. A folder of evidence based information with a focus on a plant based diet and general nutrition during cancer was given to the patient.  The importance of legitimate, evidence based information was discussed and examples were given. As a part of the continuum of care the cancer dietitian's contact information was given to the patient in the event they would like to have a follow up appointment.

## 2015-12-17 NOTE — Addendum Note (Signed)
Encounter addended by: Hayden Pedro, PA-C on: 12/17/2015  3:30 PM<BR>     Documentation filed: Follow-up Section, LOS Section

## 2015-12-17 NOTE — Assessment & Plan Note (Signed)
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 After reviewing the pathology in detail, we proceeded to discuss the different treatment options between surgery, radiation, chemotherapy, antiestrogen therapies.  Recommendation: 1. Genetics consultation 2. Breast MRI 3. Neoadjuvant chemotherapy with dose dense Adriamycin Cytoxan 4 followed by Abraxane weekly 12 4. Followed by breast conserving surgery 5. Followed by adjuvant radiation therapy  Chemotherapy Counseling: I discussed the risks and benefits of chemotherapy including the risks of nausea/ vomiting, risk of infection from low WBC count, fatigue due to chemo or anemia, bruising or bleeding due to low platelets, mouth sores, loss/ change in taste and decreased appetite. Liver and kidney function will be monitored through out chemotherapy as abnormalities in liver and kidney function may be a side effect of treatment. Cardiac dysfunction due to Adriamycin was discussed in detail. Risk of permanent bone marrow dysfunction and leukemia due to chemo were also discussed.  Plan: 1. Echocardiogram 2. Port placement 3. Chemotherapy class Start chemotherapy in 2 weeks

## 2015-12-17 NOTE — Patient Instructions (Signed)

## 2015-12-17 NOTE — Progress Notes (Signed)
[facility] NOTE  Patient Care Team: Unk Pinto, MD as PCP - General (Internal Medicine) Warden Fillers, MD as Consulting Physician (Ophthalmology) Kathie Rhodes, MD as Consulting Physician (Urology) Danella Sensing, MD as Consulting Physician (Dermatology) Alfonso Patten, RN as Registered Nurse Dorothy Spark, MD as Consulting Physician (Cardiology) Sylvan Cheese, NP as Nurse Practitioner (Hematology and Oncology)  CHIEF COMPLAINTS/PURPOSE OF CONSULTATION:  Newly diagnosed breast cancer  HISTORY OF PRESENTING ILLNESS:  [name] 60 y.o. female is here because of recent diagnosis of right breast cancer. Patient felt a lump in the right breast which was evaluated by mammogram and ultrasound. Mammogram revealed a 2.8 cm lesion. 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 She was presented this morning of the multidisciplinary conference and she is here today at the [facility] clinic to discuss the treatment plan. She is accompanied by HER-2 daughters. Patient currently lives with her youngest daughter. She has a long-standing history of eczema throughout her body.  I reviewed her records extensively and collaborated the history with the patient.  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  She was never exposed to fertility medications or hormone replacement therapy.  She has  family history of Breast/GYN/GI cancer  MEDICAL HISTORY:  Past Medical History  Diagnosis Date  . MRSA (methicillin resistant staph aureus) culture positive   . Eczema   . Hypercholesteremia   . Hypertension   . Kidney stones   . Anemia   . Vitamin D deficiency   . Paroxysmal atrial fibrillation ([place]) 10/2011, 06/2015  . GERD (gastroesophageal reflux disease)   . Atrial fibrillation ([place]) 2016  . Dysrhythmia     A FIB - followed by Dr. Meda Coffee  . Breast cancer of upper-outer quadrant of right female breast ([place]) 12/12/2015  . Breast cancer [facility])     SURGICAL HISTORY: Past Surgical History  Procedure Laterality Date  . Breast surgery Left     biopsy-neg  . Kidney stone surgery  2013  . Nephrolithotomy Right 10/06/2015    Procedure: NEPHROLITHOTOMY PERCUTANEOUS;  Surgeon: Kathie Rhodes, MD;  Location: [facility] ORS;  Service: Urology;  Laterality: Right;  . Cystoscopy with holmium laser lithotripsy Right 10/06/2015    Procedure: CYSTOSCOPY WITH HOLMIUM LASER LITHOTRIPSY;  Surgeon: Kathie Rhodes, MD;  Location: [facility];  Service: Urology;  Laterality: Right;    SOCIAL HISTORY: Social History   Social History  . Marital Status: Widowed    Spouse Name: N/A  . Number of Children: N/A  . Years of Education: N/A   Occupational History  . Not on file.   Social History Main Topics  . Smoking status: Never Smoker   . Smokeless tobacco: Never Used  . Alcohol Use: No  . Drug Use:  No  . Sexual Activity: Not on file   Other Topics Concern  . Not on file   Social History Narrative    FAMILY HISTORY: Family History  Problem Relation Age of Onset  . Hypertension Mother   . Hepatitis C Mother   . Heart disease Father   . Dementia Father   . Aneurysm Brother     ALLERGIES:  is allergic to penicillins.  MEDICATIONS:  Current Outpatient  Prescriptions  Medication Sig Dispense Refill  . atenolol (TENORMIN) 100 MG tablet take 1/2 to 1 tablet by mouth once daily as directed 90 tablet 0  . cholecalciferol (VITAMIN D) 1000 UNITS tablet Take 4,000 Units by mouth daily.    Marland Kitchen diltiazem (CARDIZEM CD) 120 MG 24 hr capsule Take 1 capsule (120 mg total) by mouth daily. 90 capsule 3  . doxepin (SINEQUAN) 50 MG capsule take 1 capsule by mouth once daily at bedtime 30 capsule 1  . ferrous sulfate 325 (65 FE) MG tablet Take 325 mg by mouth daily with breakfast.    . rivaroxaban (XARELTO) 20 MG TABS tablet Take 1 tablet (20 mg total) by mouth daily with supper. 90 tablet 3  . triamterene-hydrochlorothiazide (MAXZIDE-25) 37.5-25 MG tablet Take 1 tablet by mouth daily. 30 tablet 3   No current facility-administered medications for this visit.    REVIEW OF SYSTEMS:   Constitutional: Denies fevers, chills or abnormal night sweats Eyes: Denies blurriness of vision, double vision or watery eyes Ears, nose, mouth, throat, and face: Denies mucositis or sore throat Respiratory: Denies cough, dyspnea or wheezes Cardiovascular: Denies palpitation, chest discomfort or lower extremity swelling Gastrointestinal:  Denies nausea, heartburn or change in bowel habits Skin: Diffuse eczema throughout her body Lymphatics: Denies new lymphadenopathy or easy bruising Neurological:Denies numbness, tingling or new weaknesses Behavioral/Psych: Mood is stable, no new changes  Breast: Palpable lump in the right breast All other systems were reviewed with the patient and are negative.  PHYSICAL EXAMINATION: ECOG PERFORMANCE STATUS: 1 - Symptomatic but completely ambulatory  Filed Vitals:   12/17/15 0832  BP: 116/74  Pulse: 91  Temp: 98.3 F (36.8 C)  Resp: 18   Filed Weights   12/17/15 0832  Weight: 176 lb 11.2 oz (80.151 kg)    GENERAL:alert, no distress and comfortable SKIN: Diffuse eczema throughout her body EYES: normal, conjunctiva are pink and  non-injected, sclera clear OROPHARYNX:no exudate, no erythema and lips, buccal mucosa, and tongue normal  NECK: supple, thyroid normal size, non-tender, without nodularity LYMPH:  no palpable lymphadenopathy in the cervical, axillary or inguinal LUNGS: clear to auscultation and percussion with normal breathing effort HEART: regular rate & rhythm and no murmurs and no lower extremity edema ABDOMEN:abdomen soft, non-tender and normal bowel sounds Musculoskeletal:no cyanosis of digits and no clubbing  PSYCH: alert & oriented x 3 with fluent speech NEURO: no focal motor/sensory deficits BREAST:Right breast lump 3-4 cm palpation. No palpable axillary or supraclavicular lymphadenopathy (exam performed in the presence of a chaperone)   LABORATORY DATA:  I have reviewed the data as listed Lab Results  Component Value Date   WBC 3.5* 12/17/2015   HGB 13.8 12/17/2015   HCT 41.9 12/17/2015   MCV 91.7 12/17/2015   PLT 224 12/17/2015   Lab Results  Component Value Date   NA 139 12/17/2015   K 3.8 12/17/2015   CL 103 10/28/2015   CO2 27 12/17/2015    RADIOGRAPHIC STUDIES: I have personally reviewed the radiological reports and agreed with the findings  in the report.  ASSESSMENT AND PLAN:  Breast cancer of upper-outer quadrant of right female breast (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 After reviewing the pathology in detail, we proceeded to discuss the different  treatment options between surgery, radiation, chemotherapy, antiestrogen therapies.  Recommendation: 1. Genetics consultation 2. Breast MRI 3. Neoadjuvant chemotherapy with dose dense Adriamycin Cytoxan 4 followed by Abraxane weekly 12 4. Followed by breast conserving surgery 5. Followed by adjuvant radiation therapy  Chemotherapy Counseling: I discussed the risks and benefits of chemotherapy including the risks of nausea/ vomiting, risk of infection from low WBC count, fatigue due to chemo or anemia, bruising or bleeding due to low platelets, mouth sores, loss/ change in taste and decreased appetite. Liver and kidney function will be monitored through out chemotherapy as abnormalities in liver and kidney function may be a side effect of treatment. Cardiac dysfunction due to Adriamycin was discussed in detail. Risk of permanent bone marrow dysfunction and leukemia due to chemo were also discussed.  Plan: 1. Echocardiogram 2. Port placement 3. Chemotherapy class Start chemotherapy in 2 weeks      All questions were answered. The patient knows to call the clinic with any problems, questions or concerns.    Rulon Eisenmenger, MD 12/17/2015

## 2015-12-17 NOTE — Progress Notes (Signed)
[facility] Psychosocial Distress Screening Clinical Social Work  Clinical Social Work met with pt and her daughters; Jana Half at [facility] to introduce self, explain role of CSW/Pt and Family Support team and to review distress screening protocol.  The patient scored a 8 on the Psychosocial Distress Thermometer which indicates severe distress. Clinical Social Worker reviewed concerns to assess for distress and other psychosocial needs. 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 Pt plans to take FMLA while going through chemo as a result. She reports to have STD through her work, but may need additional assistance in the future. Pt reports her older sisters are stress producing and she asked them to leave her appt today. CSW reviewed healthy coping techniques and boundary setting. Daughter, Levada Dy lives with pt and both can assist with transportation and needs. They appeared very supportive of their mother today.   Pt reports to have received information today and questions answered. She reports to be slightly less anxious, but appears overwhelmed with treatment plan as a whole. She is very open to additional support and agrees to CSW checking in. Daughters encouraged pt to reach out to [place] as well. Pt feels MD did address her pain today and answered questions. CSW to follow.   ONCBCN DISTRESS SCREENING 12/17/2015  Screening Type Initial Screening  Distress experienced in past week (1-10) 8  Practical problem type Work/school  Family Problem type Other (comment)  Emotional problem type Nervousness/Anxiety;Adjusting to illness  Spiritual/Religous concerns type Relating to God  Information Concerns Type Lack of info about diagnosis;Lack of info about treatment  Physical Problem type Pain  Physician notified of physical symptoms Yes  Referral to clinical social work Yes  Referral to support programs Yes    Clinical Social  Worker follow up needed: Yes.      If yes, follow up plan: See above Loren Racer, [place] Worker [place]  [facility] Phone: [phone] Fax: [phone]

## 2015-12-17 NOTE — Progress Notes (Signed)
Radiation Oncology         [phone] ________________________________  Name: [name] MRN: [id number]  Date: 12/17/2015  DOB: 09/07/1956  [id number],LZJQBHA DAVID, MD  Stark Klein, MD     REFERRING PHYSICIAN: Stark Klein, MD   DIAGNOSIS: The encounter diagnosis was Breast cancer of upper-outer quadrant of right female breast ([place]).   HISTORY OF PRESENT ILLNESS::[name] is a 60 y.o. female seen in the multidisciplinary breast cancer clinic. The patient identified a palpable breast mass on the right. A diagnostic mammogram on 12/05/15 revealed a 2.8 x 2.7 x 2.1 cm mass in the right upper outer quadrant of the right breast. There were prominent lymph nodes that appeared stable since 2008 imaging. 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 She did have prominent lymph nodes in the right that were seen on previous imaging. She comes today to meet with Dr. Lisbeth Renshaw to discuss the role of radiotherapy.    PREVIOUS RADIATION THERAPY: No   PAST MEDICAL HISTORY:  Past Medical History  Diagnosis Date  . MRSA (methicillin resistant staph aureus) culture positive   . Eczema   . Hypercholesteremia   . Hypertension   . Kidney stones   . Anemia   . Vitamin D deficiency   . Paroxysmal atrial fibrillation ([place]) 10/2011, 06/2015  . GERD (gastroesophageal reflux disease)   . Atrial fibrillation ([place]) 2016  . Dysrhythmia     A FIB - followed by Dr. Meda Coffee  . Breast cancer of upper-outer quadrant of right female breast ([place]) 12/12/2015  . Breast cancer ([place])        PAST SURGICAL HISTORY: Past Surgical History  Procedure Laterality Date  . Breast surgery Left     biopsy-neg  . Kidney stone surgery  2013  . Nephrolithotomy Right 10/06/2015    Procedure: NEPHROLITHOTOMY PERCUTANEOUS;  Surgeon: Kathie Rhodes, MD;  Location: [facility];  Service: Urology;  Laterality: Right;  . Cystoscopy with holmium laser  lithotripsy Right 10/06/2015    Procedure: CYSTOSCOPY WITH HOLMIUM LASER LITHOTRIPSY;  Surgeon: Kathie Rhodes, MD;  Location: [facility];  Service: Urology;  Laterality: Right;     FAMILY HISTORY:  Family History  Problem Relation Age of Onset  . Hypertension Mother   . Hepatitis C Mother   . Heart disease Father   . Dementia Father   . Aneurysm Brother      SOCIAL HISTORY:  reports that she has never smoked. She has never used smokeless tobacco. She reports that she does not drink alcohol or use illicit drugs.   ALLERGIES: Penicillins   MEDICATIONS:  Current Outpatient Prescriptions  Medication Sig Dispense Refill  . atenolol (TENORMIN) 100 MG tablet take 1/2 to 1 tablet by mouth once daily as directed 90 tablet 0  . cholecalciferol (VITAMIN D) 1000 UNITS tablet Take 4,000 Units by mouth daily.    Marland Kitchen diltiazem (CARDIZEM CD) 120 MG 24 hr capsule Take 1 capsule (120 mg total) by mouth daily. 90 capsule 3  . doxepin (SINEQUAN) 50 MG capsule take 1 capsule by mouth once daily at bedtime 30 capsule 1  . ferrous sulfate 325 (65 FE) MG tablet Take 325 mg by mouth daily with breakfast.    . rivaroxaban (XARELTO) 20 MG TABS tablet Take 1 tablet (20 mg total) by mouth daily with supper. 90 tablet 3  . triamterene-hydrochlorothiazide (MAXZIDE-25) 37.5-25 MG tablet Take 1 tablet by mouth daily. 30 tablet 3  No current facility-administered medications for this encounter.     REVIEW OF SYSTEMS: On review of systems, the patient states that she is doing well overall. She has been battling a cough and hoarseness with coughing for the last 3 weeks. She denies any fevers or chills. She notes soreness of her right breast with palpation but otherwise is asymptomatic. She denies any new skin irritation or problems and manages her skin with prednisone. A complete review of systems is obtained and is otherwise negative.    PHYSICAL EXAM:  BP 116/74 P 91 RR 18 T 98.3 Pulse ox 98%  Pain scale  0/10 In general this is a well appearing African American female in no acute distress. She is alert and oriented x4 and appropriate throughout the examination. HEENT reveals that the patient is normocephalic, atraumatic. EOMs are intact. PERRLA. Skin is intact without any evidence of gross lesions, but is parchment like and dry in texture. There are pigment changes consistent with vitiligo of her hands and feet. Cardiovascular exam reveals a regular rate and rhythm, no clicks rubs or murmurs are auscultated. Chest is clear to auscultation bilaterally. Lymphatic assessment is performed and does not reveal any adenopathy in the cervical, supraclavicular, axillary, or inguinal chains. Bilateral breasts are assessed and reveal palpable mass in the right upper outer quadrant. This is somewhat tender to palpation without dimpling or puckering of the skin. No other masses are appreciated. The left breast is intact without palpable abnormalities. No nipple bleeding or discharge is present of the skin. Abdomen has active bowel sounds in all quadrants and is intact. The abdomen is soft, non tender, non distended. Lower extremities are negative for pretibial pitting edema, deep calf tenderness, cyanosis or clubbing.   ECOG = 1  0 - Asymptomatic (Fully active, able to carry on all predisease activities without restriction)  1 - Symptomatic but completely ambulatory (Restricted in physically strenuous activity but ambulatory and able to carry out work of a light or sedentary nature. For example, light housework, office work)  2 - Symptomatic, <50% in bed during the day (Ambulatory and capable of all self care but unable to carry out any work activities. Up and about more than 50% of waking hours)  3 - Symptomatic, >50% in bed, but not bedbound (Capable of only limited self-care, confined to bed or chair 50% or more of waking hours)  4 - Bedbound (Completely disabled. Cannot carry on any self-care. Totally confined  to bed or chair)  5 - Death   Eustace Pen MM, Creech RH, Tormey DC, et al. [phone]). "Toxicity and response criteria of the [facility] [employer] [facility] [employer]". [place] Oncol. 5 (6): 649-55    LABORATORY DATA:  Lab Results  Component Value Date   WBC 3.5* 12/17/2015   HGB 13.8 12/17/2015   HCT 41.9 12/17/2015   MCV 91.7 12/17/2015   PLT 224 12/17/2015   Lab Results  Component Value Date   NA 139 12/17/2015   K 3.8 12/17/2015   CL 103 10/28/2015   CO2 27 12/17/2015   Lab Results  Component Value Date   ALT <9 12/17/2015   AST 14 12/17/2015   ALKPHOS 72 12/17/2015   BILITOT 0.39 12/17/2015      RADIOGRAPHY: Mm Digital Diagnostic Unilat R  12/10/2015  CLINICAL DATA:  60 year old female status post ultrasound-guided right breast biopsy EXAM: DIAGNOSTIC RIGHT MAMMOGRAM POST ULTRASOUND BIOPSY COMPARISON:  Previous exam(s). FINDINGS: Mammographic images were obtained following ultrasound guided biopsy of a suspicious right  breast mass at 10 o'clock. Post biopsy mammogram demonstrates the ribbon shaped biopsy marker to be within the mass in the upper, outer right breast. IMPRESSION: Satisfactory marker placement post ultrasound-guided right breast biopsy. Final Assessment: Post Procedure Mammograms for Marker Placement Electronically Signed   By: Pamelia Hoit M.D.   On: 12/10/2015 09:16   US Breast Ltd Uni Right Inc Axilla  12/05/2015  CLINICAL DATA:  60 year old female with palpable mass in the upper outer right breast discovered on self-examination. EXAM: DIGITAL DIAGNOSTIC BILATERAL MAMMOGRAM WITH 3D TOMOSYNTHESIS WITH CAD ULTRASOUND RIGHT BREAST COMPARISON:  Previous exam(s). ACR Breast Density Category b: There are scattered areas of fibroglandular density. FINDINGS: 2D and 3D full field views of both breasts and a spot compression view of the right breast demonstrate and irregular mass in the posterior upper-outer right breast. No other suspicious mass, distortion or worrisome  calcifications noted. Mildly prominent lymph nodes bilaterally are unchanged. Mammographic images were processed with CAD. On physical exam, a firm palpable mass identified at the 10 o'clock position of the right breast 7 cm from the nipple. Targeted ultrasound is performed, showing a 2.8 x 2.7 x 2.1 cm irregular hypoechoic mass at the 10 o'clock position the right breast 7 cm from the nipple. A few mildly prominent right axillary lymph nodes are identified but are unchanged mammographically since 2008. IMPRESSION: 2.8 x 2.7 x 2.1 cm highly suspicious mass in the upper-outer right breast -tissue sampling is recommended. No mammographic evidence of left breast malignancy. Mildly prominent bilateral axillary lymph nodes, unchanged from 2008. RECOMMENDATION: Ultrasound-guided right breast biopsy, which has been scheduled for 12/10/2015. I have discussed the findings and recommendations with the patient. Results were also provided in writing at the conclusion of the visit. If applicable, a reminder letter will be sent to the patient regarding the next appointment. BI-RADS CATEGORY  5: Highly suggestive of malignancy. Electronically Signed   By: Margarette Canada M.D.   On: 12/05/2015 12:11   Mm Diag Breast Tomo Bilateral  12/05/2015  CLINICAL DATA:  59 year old female with palpable mass in the upper outer right breast discovered on self-examination. EXAM: DIGITAL DIAGNOSTIC BILATERAL MAMMOGRAM WITH 3D TOMOSYNTHESIS WITH CAD ULTRASOUND RIGHT BREAST COMPARISON:  Previous exam(s). ACR Breast Density Category b: There are scattered areas of fibroglandular density. FINDINGS: 2D and 3D full field views of both breasts and a spot compression view of the right breast demonstrate and irregular mass in the posterior upper-outer right breast. No other suspicious mass, distortion or worrisome calcifications noted. Mildly prominent lymph nodes bilaterally are unchanged. Mammographic images were processed with CAD. On physical exam, a  firm palpable mass identified at the 10 o'clock position of the right breast 7 cm from the nipple. Targeted ultrasound is performed, showing a 2.8 x 2.7 x 2.1 cm irregular hypoechoic mass at the 10 o'clock position the right breast 7 cm from the nipple. A few mildly prominent right axillary lymph nodes are identified but are unchanged mammographically since 2008. IMPRESSION: 2.8 x 2.7 x 2.1 cm highly suspicious mass in the upper-outer right breast -tissue sampling is recommended. No mammographic evidence of left breast malignancy. Mildly prominent bilateral axillary lymph nodes, unchanged from 2008. RECOMMENDATION: Ultrasound-guided right breast biopsy, which has been scheduled for 12/10/2015. I have discussed the findings and recommendations with the patient. Results were also provided in writing at the conclusion of the visit. If applicable, a reminder letter will be sent to the patient regarding the next appointment. BI-RADS CATEGORY  5: Highly suggestive  of malignancy. Electronically Signed   By: Margarette Canada M.D.   On: 12/05/2015 12:11   [place] Rt Breast Bx W Loc Dev 1st Lesion Img Bx Spec US Guide  12/11/2015  ADDENDUM REPORT: 12/11/2015 11:57 ADDENDUM: Pathology revealed GRADE III INVASIVE DUCTAL CARCINOMA WITH EXTENSIVE NECROSIS of the Right breast at the 10:00 o'clock location. This was found to be concordant by Dr. Pamelia Hoit. Pathology results were discussed with the patient by telephone. The patient reported doing well after the biopsy. Post biopsy instructions and care were reviewed and questions were answered. The patient was encouraged to call [facility] for any additional concerns. The patient was referred to [facility] on December 17, 2015. Pathology results reported by Terie Purser, RN on 12/11/2015. Electronically Signed   By: Pamelia Hoit M.D.   On: 12/11/2015 11:57  12/11/2015  CLINICAL DATA:   60 year old female for ultrasound-guided right breast biopsy EXAM: ULTRASOUND GUIDED RIGHT BREAST CORE NEEDLE BIOPSY COMPARISON:  Previous exam(s). FINDINGS: I met with the patient and we discussed the procedure of ultrasound-guided biopsy, including benefits and alternatives. We discussed the high likelihood of a successful procedure. We discussed the risks of the procedure, including infection, bleeding, tissue injury, clip migration, and inadequate sampling. Informed written consent was given. The usual time-out protocol was performed immediately prior to the procedure. Using sterile technique and 2% Lidocaine as local anesthetic, under direct ultrasound visualization, a 12 gauge spring-loaded device was used to perform biopsy of a suspicious right breast mass at 10 o'clock, 7 cm from the nipple using a lateral to medial approach. At the conclusion of the procedure a ribbon shaped tissue marker clip was deployed into the biopsy cavity. Follow up 2 view mammogram was performed and dictated separately. IMPRESSION: Ultrasound guided biopsy of a suspicious right breast mass. No apparent complications. Electronically Signed: By: Pamelia Hoit M.D. On: 12/10/2015 09:15       IMPRESSION:  Triple negative, high grade invasive ductal carcinoma of the right breast.  PLAN:  Dr. Lisbeth Renshaw discusses the pathology findings and reviews the nature of her disease. The consensus from the breast conference includes neoadjuvant chemotherapy with breast conservation with lumpectomy,  followed by external radiotherapy to the breast. He discusses that the recommendation was also to meet with genetic counseling due to her triple negative status. We will plan to see the patient about 2-3 weeks following surgery if she does only require lumpectomy.  With her chronic eczema, this would not present itself as a contraindication to radiotherapy. He recommends a 6 week course of radiation therapy due to her skin irritation. She states  agreement and understanding of this plan, the risks, benefits, and treatment strategies are outlined.     The above documentation reflects my direct findings during this shared patient visit. Please see the separate note by Dr. Lisbeth Renshaw on this date for the remainder of the patient's plan of care.  Carola Rhine, PAC

## 2015-12-17 NOTE — Therapy (Signed)
[place], [place], [address] Phone: [phone]   Fax:  [phone]  Physical Therapy Evaluation  Patient Details  Name: [name] MRN: [id number] Date of Birth: 04-29-1956 Referring Provider: Dr. Stark Klein  Encounter Date: 12/17/2015      PT End of Session - 12/17/15 1113    Visit Number 1   Number of Visits 1   PT Start Time [id number]   PT Stop Time 1110   PT Time Calculation (min) 25 min   Activity Tolerance Patient tolerated treatment well   Behavior During Therapy [facility] for tasks assessed/performed      Past Medical History  Diagnosis Date  . MRSA (methicillin resistant staph aureus) culture positive   . Eczema   . Hypercholesteremia   . Hypertension   . Kidney stones   . Anemia   . Vitamin D deficiency   . Paroxysmal atrial fibrillation ([place]) 10/2011, 06/2015  . GERD (gastroesophageal reflux disease)   . Atrial fibrillation ([place]) 2016  . Dysrhythmia     A FIB - followed by Dr. Meda Coffee  . Breast cancer of upper-outer quadrant of right female breast ([place]) 12/12/2015  . Breast cancer [facility])     Past Surgical History  Procedure Laterality Date  . Breast surgery Left     biopsy-neg  . Kidney stone surgery  2013  . Nephrolithotomy Right 10/06/2015    Procedure: NEPHROLITHOTOMY PERCUTANEOUS;  Surgeon: Kathie Rhodes, MD;  Location: [facility];  Service: Urology;  Laterality: Right;  . Cystoscopy with holmium laser lithotripsy Right 10/06/2015    Procedure: CYSTOSCOPY WITH HOLMIUM LASER LITHOTRIPSY;  Surgeon: Kathie Rhodes, MD;  Location[facility];  Service: Urology;  Laterality: Right;    There were no vitals filed for this visit.  Visit Diagnosis:  Carcinoma of upper-outer quadrant of right female breast [facility]) - Plan: PT plan of care cert/re-cert  Abnormal posture - Plan: PT plan of care cert/re-cert      Subjective Assessment - 12/17/15 1040    Subjective Patient was seen today for a  baseline assessment of her newly diagnosed right breast cancer.   Pertinent History Patient was diagnosed on 12/05/15 with right Triple negative breast cancer.  It is located in the upper outer quadrant and measures 2.8 cm.  It is high grade invasive ductal carcinoma. Her case was discussed in the breast multi-disciplinary conference among her medical team and a treatment plan determined.   Patient Stated Goals Reduce lymphedema risk and learn post op shoulder ROM HEP   Currently in Pain? No/denies            [facility] PT Assessment - 12/17/15 0001    Assessment   Medical Diagnosis Right breast cancer   Referring Provider Dr. Stark Klein   Onset Date/Surgical Date 12/05/15   Hand Dominance Right   Prior Therapy none   Precautions   Precautions Other (comment)   Precaution Comments Active breast cancer; severe eczema   Restrictions   Weight Bearing Restrictions No   Balance Screen   Has the patient fallen in the past 6 months No   Has the patient had a decrease in activity level because of a fear of falling?  No   Is the patient reluctant to leave their home because of a fear of falling?  No   Home Environment   Living Environment Private residence   Living Arrangements Children  Lives with adult daughter   Available Help at Discharge Family  Prior Function   Level of Independence Independent   Vocation Full time employment   Paramedic   Leisure She walks 2-3x/week for 30 minutes   Cognition   Overall Cognitive Status Within Functional Limits for tasks assessed   Posture/Postural Control   Posture/Postural Control Postural limitations   Postural Limitations Rounded Shoulders;Forward head   ROM / Strength   AROM / PROM / Strength AROM;Strength   AROM   AROM Assessment Site Shoulder   Right/Left Shoulder Right;Left   Right Shoulder Extension 45 Degrees   Right Shoulder Flexion 162 Degrees   Right Shoulder ABduction 157 Degrees   Right Shoulder  Internal Rotation 70 Degrees   Right Shoulder External Rotation 80 Degrees   Left Shoulder Extension 43 Degrees   Left Shoulder Flexion 154 Degrees   Left Shoulder ABduction 171 Degrees   Left Shoulder Internal Rotation 70 Degrees   Left Shoulder External Rotation 85 Degrees   Strength   Overall Strength Within functional limits for tasks performed           LYMPHEDEMA/ONCOLOGY QUESTIONNAIRE - 12/17/15 1111    Type   Cancer Type Right Triple negative breast cancer   Lymphedema Assessments   Lymphedema Assessments Upper extremities   Right Upper Extremity Lymphedema   10 cm Proximal to Olecranon Process 31.4 cm   Olecranon Process 23.8 cm   10 cm Proximal to Ulnar Styloid Process 20.7 cm   Just Proximal to Ulnar Styloid Process 14.8 cm   Across Hand at [employer] 16.6 cm   At [place] of 2nd Digit 5.3 cm   Left Upper Extremity Lymphedema   10 cm Proximal to Olecranon Process 29.8 cm   Olecranon Process 22.3 cm   10 cm Proximal to Ulnar Styloid Process 19.8 cm   Just Proximal to Ulnar Styloid Process 14.6 cm   Across Hand at [employer] 16.7 cm   At [place] of 2nd Digit 5.3 cm      Patient was instructed today in a home exercise program today for post op shoulder range of motion. These included active assist shoulder flexion in sitting, scapular retraction, wall walking with shoulder abduction, and hands behind head external rotation.  She was encouraged to do these twice a day, holding 3 seconds and repeating 5 times when permitted by her physician.        PT Education - 12/17/15 1112    Education provided Yes   Education Details Lymphedema risk reduction and post op shoulder ROM HEP   Person(s) Educated Patient;Child(ren)   Methods Explanation;Demonstration;Handout   Comprehension Verbalized understanding;Returned demonstration              Breast Clinic Goals - 12/17/15 1122    Patient will be able to verbalize understanding of pertinent lymphedema risk  reduction practices relevant to her diagnosis specifically related to skin care.   Time 1   Period Days   Status Achieved   Patient will be able to return demonstrate and/or verbalize understanding of the post-op home exercise program related to regaining shoulder range of motion.   Time 1   Period Days   Status Achieved   Patient will be able to verbalize understanding of the importance of attending the postoperative After Breast Cancer Class for further lymphedema risk reduction education and therapeutic exercise.   Time 1   Period Days   Status Achieved              Plan - 12/17/15 1113  Clinical Impression Statement Patient was diagnosed on 12/05/15 with right Triple negative breast cancer.  It is located in the upper outer quadrant and measures 2.8 cm.  It is high grade invasive ductal carcinoma. Her case was discussed in the breast multi-disciplinary conference among her medical team and a treatment plan determined.  She is planning to have neoadjuvant chemotherapy followed by a right lumpectomy and sentinel node biopsy and radiation.  She may benefit from post op PT to regain shoulder ROM and strength and reduce lymphedema risk.   Pt will benefit from skilled therapeutic intervention in order to improve on the following deficits Decreased strength;Decreased scar mobility;Pain;Decreased knowledge of precautions;Impaired UE functional use;Decreased range of motion;Increased fascial restricitons   Rehab Potential Excellent   Clinical Impairments Affecting Rehab Potential none   PT Frequency One time visit   PT Treatment/Interventions Therapeutic exercise;Patient/family education   PT Next Visit Plan f/u after surgery to determine needs   PT Home Exercise Plan Shoulder ROM HEP   Consulted and Agree with Plan of Care Patient;Family member/caregiver   Family Member Consulted 2 daughters       Patient will follow up at outpatient cancer rehab if needed following surgery.  If the  patient requires physical therapy at that time, a specific plan will be dictated and sent to the referring physician for approval. The patient was educated today on appropriate basic range of motion exercises to begin post operatively and the importance of attending the After Breast Cancer class following surgery.  Patient was educated today on lymphedema risk reduction practices as it pertains to recommendations that will benefit the patient immediately following surgery.  She verbalized good understanding.  No additional physical therapy is indicated at this time.      Problem List Patient Active Problem List   Diagnosis Date Noted  . Breast cancer of upper-outer quadrant of right female breast ([place]) 12/12/2015  . Frequent UTI 10/28/2015  . Alteration in anticoagulation 10/24/2015  . PAF (paroxysmal atrial fibrillation) ([place]) 10/24/2015  . Essential hypertension 10/24/2015  . Nausea & vomiting 10/12/2015  . Nausea after anesthesia 10/09/2015  . Renal calculus, right   . Atrial fibrillation with RVR ([place]) 07/08/2015  . Obesity 12/05/2014  . Prediabetes 11/13/2013  . Mixed hyperlipidemia   . Kidney stones   . Anemia   . Eczema   . Vitamin D deficiency     Annia Friendly, [place] 12/17/2015 11:24 AM  [place], [place], [address] Phone: [phone]   Fax:  [phone]  Name: [name] MRN: [id number] Date of Birth: [birth year]

## 2015-12-17 NOTE — Progress Notes (Signed)
Ms. Lizana is a very pleasant 60 y.o. 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  Biopsy results revealed the tumor's prognostic profile is ER negative, PR negative, and HER2/neu negative.  She presents today with her two daughters to the [facility]) for treatment consideration and recommendations from the breast surgeon, radiation oncologist, and medical oncologist.     I briefly met with Ms. Angert and her daughters during her [facility] visit today. We discussed the purpose of the Survivorship Clinic, which will include monitoring for recurrence, coordinating completion of age and gender-appropriate cancer screenings, promotion of overall wellness, as well as managing potential late/long-term side effects of anti-cancer treatments.    The treatment plan for Ms. Mcfayden will likely include chemotherapy, surgery, and radiation therapy.  She will meet with the Genetics Counselor due to her triple negative status and family history of breast cancer. As of today, the intent of treatment for Ms. Klosowski is cure, therefore she will be eligible for the Survivorship Clinic upon her completion of treatment.  Her survivorship care plan (SCP) document will be drafted and updated throughout the course of her treatment trajectory. She will receive the SCP in an office visit with myself in the Survivorship Clinic once she has completed treatment.   Ms. Mallet was encouraged to ask questions and all questions were answered to her satisfaction.  She was given my business card and encouraged to contact me with any concerns regarding survivorship.  I look forward to participating in her care.   Kenn File, [place] [phone]

## 2015-12-18 ENCOUNTER — Telehealth: Payer: Self-pay | Admitting: Hematology and Oncology

## 2015-12-18 ENCOUNTER — Encounter (HOSPITAL_COMMUNITY): Payer: Self-pay | Admitting: *Deleted

## 2015-12-18 NOTE — Telephone Encounter (Signed)
Spoke with patient to confirm added appt on 3/9 chemo edu per 3/1 pof

## 2015-12-19 ENCOUNTER — Encounter (HOSPITAL_COMMUNITY): Payer: Self-pay | Admitting: *Deleted

## 2015-12-22 ENCOUNTER — Encounter: Payer: Self-pay | Admitting: Internal Medicine

## 2015-12-22 ENCOUNTER — Encounter: Payer: Self-pay | Admitting: Hematology and Oncology

## 2015-12-22 ENCOUNTER — Ambulatory Visit (INDEPENDENT_AMBULATORY_CARE_PROVIDER_SITE_OTHER): Payer: BC Managed Care – PPO | Admitting: Internal Medicine

## 2015-12-22 ENCOUNTER — Telehealth: Payer: Self-pay | Admitting: *Deleted

## 2015-12-22 VITALS — BP 118/66 | HR 74 | Temp 98.0°F | Resp 16 | Ht 62.0 in | Wt 180.0 lb

## 2015-12-22 DIAGNOSIS — C50919 Malignant neoplasm of unspecified site of unspecified female breast: Secondary | ICD-10-CM

## 2015-12-22 NOTE — Progress Notes (Signed)
Patient returned my call and agreed to meet on 12/25/15 after her chemo ed class to discuss financial options.

## 2015-12-22 NOTE — Progress Notes (Signed)
Called patient to introduce myself as Estate manager/land agent. There was no option to leave a voicemail on primary contact number. Plan to discuss [employer], [employer] copay assistance and PAF. Will have patient meet with me after chemo ed class and bring proof of income.

## 2015-12-22 NOTE — Telephone Encounter (Signed)
Spoke to pt concerning [place] from 12/17/15. Denies questions or concern regarding dx or treatment care plan. Instructed pt to arrive at 0800 on 12/31/15 for lab, Dr. Lindi Adie and 1st chemo. Received verbal understanding. Encourage pt to call with needs.

## 2015-12-22 NOTE — Progress Notes (Signed)
Patient presents for 3 month recheck.  Patient doing well. Still anxious about her surgery tomorrow to have port placed tomorrow.  She reports that she is due to start chemotherapy on the 15th.  She is going to try to bring in her FMLA papers as soon as they get mailed to her.  She is not taking valium because she is afraid she will become addicted to it.  She has all the medications she needs for after surgery.  Will not get labs today due to upcoming surgery tomorrow.  Will see back in 4-6 months.

## 2015-12-23 ENCOUNTER — Ambulatory Visit (HOSPITAL_COMMUNITY): Payer: BC Managed Care – PPO | Admitting: Anesthesiology

## 2015-12-23 ENCOUNTER — Encounter (HOSPITAL_COMMUNITY)
Admission: RE | Disposition: A | Discharge status: Home / Self Care | Payer: Self-pay | Source: Ambulatory Visit | Attending: General Surgery

## 2015-12-23 ENCOUNTER — Ambulatory Visit (HOSPITAL_COMMUNITY)
Admission: RE | Admit: 2015-12-23 | Discharge: 2015-12-23 | Disposition: A | Discharge status: Home / Self Care | Payer: BC Managed Care – PPO | Source: Ambulatory Visit | Attending: General Surgery | Admitting: General Surgery

## 2015-12-23 ENCOUNTER — Ambulatory Visit (HOSPITAL_COMMUNITY): Payer: BC Managed Care – PPO

## 2015-12-23 ENCOUNTER — Encounter (HOSPITAL_COMMUNITY): Payer: Self-pay | Admitting: Anesthesiology

## 2015-12-23 ENCOUNTER — Ambulatory Visit: Payer: Self-pay | Admitting: Internal Medicine

## 2015-12-23 DIAGNOSIS — Z87440 Personal history of urinary (tract) infections: Secondary | ICD-10-CM | POA: Diagnosis not present

## 2015-12-23 DIAGNOSIS — C50411 Malignant neoplasm of upper-outer quadrant of right female breast: Secondary | ICD-10-CM | POA: Insufficient documentation

## 2015-12-23 DIAGNOSIS — Z95828 Presence of other vascular implants and grafts: Secondary | ICD-10-CM

## 2015-12-23 DIAGNOSIS — Z171 Estrogen receptor negative status [ER-]: Secondary | ICD-10-CM | POA: Insufficient documentation

## 2015-12-23 DIAGNOSIS — I480 Paroxysmal atrial fibrillation: Secondary | ICD-10-CM | POA: Insufficient documentation

## 2015-12-23 DIAGNOSIS — Z7901 Long term (current) use of anticoagulants: Secondary | ICD-10-CM | POA: Insufficient documentation

## 2015-12-23 DIAGNOSIS — E669 Obesity, unspecified: Secondary | ICD-10-CM | POA: Diagnosis not present

## 2015-12-23 DIAGNOSIS — K219 Gastro-esophageal reflux disease without esophagitis: Secondary | ICD-10-CM | POA: Diagnosis not present

## 2015-12-23 DIAGNOSIS — Z6832 Body mass index (BMI) 32.0-32.9, adult: Secondary | ICD-10-CM | POA: Insufficient documentation

## 2015-12-23 DIAGNOSIS — D649 Anemia, unspecified: Secondary | ICD-10-CM | POA: Insufficient documentation

## 2015-12-23 DIAGNOSIS — Z803 Family history of malignant neoplasm of breast: Secondary | ICD-10-CM | POA: Insufficient documentation

## 2015-12-23 DIAGNOSIS — Z79899 Other long term (current) drug therapy: Secondary | ICD-10-CM | POA: Insufficient documentation

## 2015-12-23 DIAGNOSIS — I10 Essential (primary) hypertension: Secondary | ICD-10-CM | POA: Insufficient documentation

## 2015-12-23 HISTORY — DX: Adverse effect of unspecified anesthetic, initial encounter: T41.45XA

## 2015-12-23 HISTORY — DX: Other specified postprocedural states: Z98.890

## 2015-12-23 HISTORY — DX: Nausea with vomiting, unspecified: R11.2

## 2015-12-23 HISTORY — PX: PORTACATH PLACEMENT: SHX2246

## 2015-12-23 SURGERY — INSERTION, TUNNELED CENTRAL VENOUS DEVICE, WITH PORT
Anesthesia: General | Site: Chest | Laterality: Left

## 2015-12-23 MED ORDER — HEPARIN SOD (PORK) LOCK FLUSH 100 UNIT/ML IV SOLN
INTRAVENOUS | Status: AC
  Filled 2015-12-23: qty 5

## 2015-12-23 MED ORDER — SUCCINYLCHOLINE CHLORIDE 20 MG/ML IJ SOLN
INTRAMUSCULAR | Status: DC | PRN
  Administered 2015-12-23: 80 mg via INTRAVENOUS

## 2015-12-23 MED ORDER — LIDOCAINE HCL (CARDIAC) 20 MG/ML IV SOLN
INTRAVENOUS | Status: DC | PRN
  Administered 2015-12-23: 50 mg via INTRAVENOUS

## 2015-12-23 MED ORDER — FENTANYL CITRATE (PF) 100 MCG/2ML IJ SOLN
INTRAMUSCULAR | Status: DC | PRN
  Administered 2015-12-23 (×2): 50 ug via INTRAVENOUS

## 2015-12-23 MED ORDER — HEPARIN SOD (PORK) LOCK FLUSH 100 UNIT/ML IV SOLN
INTRAVENOUS | Status: DC | PRN
  Administered 2015-12-23: 500 [IU]

## 2015-12-23 MED ORDER — ACETAMINOPHEN 325 MG PO TABS: 650.0000 mg | ORAL_TABLET | ORAL | Status: DC | PRN

## 2015-12-23 MED ORDER — LIDOCAINE HCL 1 % IJ SOLN
INTRAMUSCULAR | Status: DC | PRN
  Administered 2015-12-23: 10 mL

## 2015-12-23 MED ORDER — SODIUM CHLORIDE 0.9% FLUSH: 3.0000 mL | INTRAVENOUS | Status: DC | PRN

## 2015-12-23 MED ORDER — BUPIVACAINE-EPINEPHRINE (PF) 0.25% -1:200000 IJ SOLN
INTRAMUSCULAR | Status: AC
  Filled 2015-12-23: qty 30

## 2015-12-23 MED ORDER — FENTANYL CITRATE (PF) 100 MCG/2ML IJ SOLN
INTRAMUSCULAR | Status: AC
  Filled 2015-12-23: qty 2

## 2015-12-23 MED ORDER — LIDOCAINE-EPINEPHRINE (PF) 1 %-1:200000 IJ SOLN
INTRAMUSCULAR | Status: AC
  Filled 2015-12-23: qty 30

## 2015-12-23 MED ORDER — ACETAMINOPHEN 650 MG RE SUPP
650.0000 mg | RECTAL | Status: DC | PRN
  Filled 2015-12-23: qty 1

## 2015-12-23 MED ORDER — PROPOFOL 10 MG/ML IV BOLUS
INTRAVENOUS | Status: DC | PRN
  Administered 2015-12-23: 160 mg via INTRAVENOUS
  Administered 2015-12-23: 40 mg via INTRAVENOUS

## 2015-12-23 MED ORDER — SODIUM CHLORIDE 0.9% FLUSH: 3.0000 mL | Freq: Two times a day (BID) | INTRAVENOUS | Status: DC

## 2015-12-23 MED ORDER — LACTATED RINGERS IV SOLN
INTRAVENOUS | Status: DC | PRN
  Administered 2015-12-23: 10:00:00 via INTRAVENOUS

## 2015-12-23 MED ORDER — MIDAZOLAM HCL 2 MG/2ML IJ SOLN
INTRAMUSCULAR | Status: AC
  Filled 2015-12-23: qty 2

## 2015-12-23 MED ORDER — ONDANSETRON HCL 4 MG/2ML IJ SOLN
INTRAMUSCULAR | Status: DC | PRN
  Administered 2015-12-23: 4 mg via INTRAVENOUS

## 2015-12-23 MED ORDER — OXYCODONE-ACETAMINOPHEN 5-325 MG PO TABS: 1.0000 | ORAL_TABLET | ORAL | Status: DC | PRN

## 2015-12-23 MED ORDER — SODIUM CHLORIDE 0.9 % IV SOLN: 250.0000 mL | INTRAVENOUS | Status: DC | PRN

## 2015-12-23 MED ORDER — DIPHENHYDRAMINE HCL 50 MG/ML IJ SOLN
INTRAMUSCULAR | Status: DC | PRN
  Administered 2015-12-23: 12.5 mg via INTRAVENOUS

## 2015-12-23 MED ORDER — DEXAMETHASONE SODIUM PHOSPHATE 10 MG/ML IJ SOLN
INTRAMUSCULAR | Status: DC | PRN
  Administered 2015-12-23: 4 mg via INTRAVENOUS

## 2015-12-23 MED ORDER — MIDAZOLAM HCL 5 MG/5ML IJ SOLN
INTRAMUSCULAR | Status: DC | PRN
  Administered 2015-12-23: 2 mg via INTRAVENOUS

## 2015-12-23 MED ORDER — CIPROFLOXACIN IN D5W 400 MG/200ML IV SOLN
INTRAVENOUS | Status: AC
  Filled 2015-12-23: qty 200

## 2015-12-23 MED ORDER — BUPIVACAINE-EPINEPHRINE 0.25% -1:200000 IJ SOLN
INTRAMUSCULAR | Status: DC | PRN
  Administered 2015-12-23: 10 mL

## 2015-12-23 MED ORDER — EPHEDRINE SULFATE 50 MG/ML IJ SOLN
INTRAMUSCULAR | Status: DC | PRN
  Administered 2015-12-23: 5 mg via INTRAVENOUS

## 2015-12-23 MED ORDER — ONDANSETRON HCL 4 MG/2ML IJ SOLN: 4.0000 mg | Freq: Once | INTRAMUSCULAR | Status: DC | PRN

## 2015-12-23 MED ORDER — SODIUM CHLORIDE 0.9 % IV SOLN
Freq: Once | INTRAVENOUS | Status: AC
  Administered 2015-12-23: 14 mL
  Filled 2015-12-23: qty 1.2

## 2015-12-23 MED ORDER — OXYCODONE HCL 5 MG PO TABS: 5.0000 mg | ORAL_TABLET | ORAL | Status: DC | PRN

## 2015-12-23 MED ORDER — CIPROFLOXACIN IN D5W 400 MG/200ML IV SOLN
400.0000 mg | INTRAVENOUS | Status: AC
  Administered 2015-12-23: 400 mg via INTRAVENOUS

## 2015-12-23 MED ORDER — LACTATED RINGERS IV SOLN: INTRAVENOUS | Status: DC

## 2015-12-23 MED ORDER — FENTANYL CITRATE (PF) 100 MCG/2ML IJ SOLN: 25.0000 ug | INTRAMUSCULAR | Status: DC | PRN

## 2015-12-23 SURGICAL SUPPLY — 33 items
BAG DECANTER FOR FLEXI CONT (MISCELLANEOUS) ×3 IMPLANT
BLADE HEX COATED 2.75 (ELECTRODE) ×3 IMPLANT
BLADE SURG 15 STRL LF DISP TIS (BLADE) ×1 IMPLANT
BLADE SURG 15 STRL SS (BLADE) ×3
BLADE SURG SZ11 CARB STEEL (BLADE) ×3 IMPLANT
CHLORAPREP W/TINT 26ML (MISCELLANEOUS) ×3 IMPLANT
COVER SURGICAL LIGHT HANDLE (MISCELLANEOUS) ×3 IMPLANT
DECANTER SPIKE VIAL GLASS SM (MISCELLANEOUS) ×1 IMPLANT
DRAPE C-ARM 42X120 X-RAY (DRAPES) ×3 IMPLANT
DRAPE LAPAROTOMY TRNSV 102X78 (DRAPE) ×3 IMPLANT
DRAPE UTILITY XL STRL (DRAPES) ×3 IMPLANT
ELECT PENCIL ROCKER SW 15FT (MISCELLANEOUS) ×3 IMPLANT
ELECT REM PT RETURN 9FT ADLT (ELECTROSURGICAL) ×3
ELECTRODE REM PT RTRN 9FT ADLT (ELECTROSURGICAL) ×1 IMPLANT
GAUZE SPONGE 4X4 16PLY XRAY LF (GAUZE/BANDAGES/DRESSINGS) ×3 IMPLANT
GLOVE BIO SURGEON STRL SZ 6 (GLOVE) ×3 IMPLANT
GLOVE INDICATOR 6.5 STRL GRN (GLOVE) ×3 IMPLANT
GOWN STRL REUS W/TWL 2XL LVL3 (GOWN DISPOSABLE) ×3 IMPLANT
GOWN STRL REUS W/TWL XL LVL3 (GOWN DISPOSABLE) ×3 IMPLANT
KIT BASIN OR (CUSTOM PROCEDURE TRAY) ×3 IMPLANT
KIT PORT POWER 8FR ISP CVUE (Catheter) ×2 IMPLANT
LIQUID BAND (GAUZE/BANDAGES/DRESSINGS) ×3 IMPLANT
NEEDLE HYPO 22GX1.5 SAFETY (NEEDLE) ×3 IMPLANT
PACK BASIC VI WITH GOWN DISP (CUSTOM PROCEDURE TRAY) ×3 IMPLANT
SUT MNCRL AB 4-0 PS2 18 (SUTURE) ×3 IMPLANT
SUT PROLENE 2 0 SH DA (SUTURE) ×6 IMPLANT
SUT VIC AB 3-0 SH 27 (SUTURE) ×3
SUT VIC AB 3-0 SH 27X BRD (SUTURE) ×1 IMPLANT
SYR CONTROL 10ML LL (SYRINGE) ×7 IMPLANT
SYRINGE 10CC LL (SYRINGE) ×3 IMPLANT
TOWEL OR 17X26 10 PK STRL BLUE (TOWEL DISPOSABLE) ×3 IMPLANT
TOWEL OR NON WOVEN STRL DISP B (DISPOSABLE) ×3 IMPLANT
YANKAUER SUCT BULB TIP 10FT TU (MISCELLANEOUS) IMPLANT

## 2015-12-23 NOTE — Progress Notes (Signed)
No open draining wounds at present. No positive PCR for MRSA in EPIC

## 2015-12-23 NOTE — Discharge Instructions (Signed)
[facility] °Office Phone Number [phone] ° ° POST OP INSTRUCTIONS ° °Always review your discharge instruction sheet given to you by the facility where your surgery was performed. ° °IF YOU HAVE DISABILITY OR FAMILY LEAVE FORMS, YOU MUST BRING THEM TO THE OFFICE FOR PROCESSING.  DO NOT GIVE THEM TO YOUR DOCTOR. ° °1. A prescription for pain medication may be given to you upon discharge.  Take your pain medication as prescribed, if needed.  If narcotic pain medicine is not needed, then you may take acetaminophen (Tylenol) or ibuprofen (Advil) as needed. °2. Take your usually prescribed medications unless otherwise directed °3. If you need a refill on your pain medication, please contact your pharmacy.  They will contact our office to request authorization.  Prescriptions will not be filled after 5pm or on week-ends. °4. You should eat very light the first 24 hours after surgery, such as soup, crackers, pudding, etc.  Resume your normal diet the day after surgery °5. It is common to experience some constipation if taking pain medication after surgery.  Increasing fluid intake and taking a stool softener will usually help or prevent this problem from occurring.  A mild laxative (Milk of Magnesia or Miralax) should be taken according to package directions if there are no bowel movements after 48 hours. °6. You may shower in 48 hours.  The surgical glue will flake off in 2-3 weeks.   °7. ACTIVITIES:  No strenuous activity or heavy lifting for 1 week.   °a. You may drive when you no longer are taking prescription pain medication, you can comfortably wear a seatbelt, and you can safely maneuver your car and apply brakes. °b. RETURN TO WORK:  __________to be determined._______________ °You should see your doctor in the office for a follow-up appointment approximately three-four weeks after your surgery.   ° °WHEN TO CALL YOUR DOCTOR: °1. Fever over 101.0 °2. Nausea and/or vomiting. °3. Extreme swelling  or bruising. °4. Continued bleeding from incision. °5. Increased pain, redness, or drainage from the incision. ° °The clinic staff is available to answer your questions during regular business hours.  Please don’t hesitate to call and ask to speak to one of the nurses for clinical concerns.  If you have a medical emergency, go to the nearest emergency room or call 911.  A surgeon from [facility] is always on call at the hospital. ° °For further questions, please visit [email or web address]  ° °

## 2015-12-23 NOTE — Anesthesia Preprocedure Evaluation (Addendum)
Anesthesia Evaluation  Patient identified by MRN, date of birth, ID band Patient awake    Reviewed: Allergy & Precautions, NPO status , Patient's Chart, lab work & pertinent test results  History of Anesthesia Complications (+) PONV, PROLONGED EMERGENCE and history of anesthetic complications  Airway Mallampati: II  TM Distance: >3 FB Neck ROM: Full    Dental  (+) Dental Advisory Given, Missing, Chipped   Pulmonary neg pulmonary ROS,    Pulmonary exam normal breath sounds clear to auscultation       Cardiovascular hypertension, Pt. on medications + dysrhythmias Atrial Fibrillation  Rhythm:Irregular Rate:Normal     Neuro/Psych negative neurological ROS  negative psych ROS   GI/Hepatic Neg liver ROS, GERD  Medicated and Controlled,  Endo/Other  Obesity   Renal/GU nephrolithiasis     Musculoskeletal negative musculoskeletal ROS (+)   Abdominal   Peds  Hematology  (+) Blood dyscrasia (on xarelto), anemia ,   Anesthesia Other Findings Day of surgery medications reviewed with the patient.  Breast cancer  Reproductive/Obstetrics                            Anesthesia Physical Anesthesia Plan  ASA: III  Anesthesia Plan: General   Post-op Pain Management:    Induction: Intravenous  Airway Management Planned: LMA  Additional Equipment:   Intra-op Plan:   Post-operative Plan: Extubation in OR  Informed Consent: I have reviewed the patients History and Physical, chart, labs and discussed the procedure including the risks, benefits and alternatives for the proposed anesthesia with the patient or authorized representative who has indicated his/her understanding and acceptance.   Dental advisory given  Plan Discussed with: CRNA  Anesthesia Plan Comments: (Risks/benefits of general anesthesia discussed with patient including risk of damage to teeth, lips, gum, and tongue,  nausea/vomiting, allergic reactions to medications, and the possibility of heart attack, stroke and death.  All patient questions answered.  Patient wishes to proceed.)        Anesthesia Quick Evaluation

## 2015-12-23 NOTE — Anesthesia Postprocedure Evaluation (Signed)
Anesthesia Post Note  Patient: [name]  Procedure(s) Performed: Procedure(s) (LRB): INSERTION PORT-A-CATH WITH [place] (Left)  Patient location during evaluation: PACU Anesthesia Type: General Level of consciousness: awake and alert Pain management: pain level controlled Vital Signs Assessment: post-procedure vital signs reviewed and stable Respiratory status: spontaneous breathing, nonlabored ventilation, respiratory function stable and patient connected to nasal cannula oxygen Cardiovascular status: blood pressure returned to baseline and stable Postop Assessment: no signs of nausea or vomiting Anesthetic complications: no    Last Vitals:  Filed Vitals:   12/23/15 1230 12/23/15 1245  BP: 134/73 128/77  Pulse: 70 64  Temp:    Resp: 19 18    Last Pain:  Filed Vitals:   12/23/15 1248  PainSc: 0-No pain                 Catalina Gravel

## 2015-12-23 NOTE — H&P (Signed)
[name] 12/17/2015 8:00 AM Location: [facility] Patient #: [id number] DOB: Apr 13, 1956 Undefined / Language: [name] / Race: Black or African American Female   History of Present Illness Stark Klein MD; 12/17/2015 11:26 AM) The patient is a 60 year old female who presents with breast cancer. Patient is a 60 yo F who presented with a palpable right breast mass x 2 weeks. She is seen at the request of Dr. Melford Aase for consultation of this diagnosis. She had a prior excisional biopsy in 2000 that was benign. She has family history in a maternal aunt. The mass was seen to be 2.8 cm at 10 o'clock on dx mammogram/ultrasound. 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 Pt denies prior breast cancer or breast pain. She does experience bilateral breast soreness occasionally.   Menarche was age 56, She is a G2P2 wtih first child in late teens. Menopause was early, <67 yo.   She has atrial fibrillation with RVR and is on Xarelto at the direction of Dr. Ena Dawley of cardiology. She also has a history of eczema and frequent UTIs.     Mammogram  ACR Breast Density Category b: There are scattered areas of fibroglandular density.  FINDINGS: 2D and 3D full field views of both breasts and a spot compression view of the right breast demonstrate and irregular mass in the posterior upper-outer right breast.  No other suspicious mass, distortion or worrisome calcifications noted.  Mildly prominent lymph nodes bilaterally are unchanged.  Mammographic images were processed with CAD.  On physical exam, a firm palpable mass identified at the 10 o'clock position of the right breast 7 cm from the nipple.  Targeted ultrasound is performed, showing a 2.8 x 2.7 x 2.1 cm irregular hypoechoic mass at the 10 o'clock position the right breast 7 cm from the nipple. A few mildly prominent right  axillary lymph nodes are identified but are unchanged mammographically since 2008.  IMPRESSION: 2.8 x 2.7 x 2.1 cm highly suspicious mass in the upper-outer right breast -tissue sampling is recommended.  No mammographic evidence of left breast malignancy.  Mildly prominent bilateral axillary lymph nodes, unchanged from 2008.  Pathology Diagnosis Breast, right, needle core biopsy, 10:00 o'clock, 7 CMFN INVASIVE HIGH GRADE DUCTAL CARCINOMA WITH EXTENSIVE NECROSIS, GRADE 3  Labs: Essentially normal with Albumin 3.4 and WBCs 3.5k  Treatment team Dr. Lindi Adie Dr. Lisbeth Renshaw Dr. Luberta Robertson, [facility] [place] Dr. Melford Aase, PCP Dr. Liane Comber cardiology Dr. Danella Sensing     Other Problems Conni Slipper, RN; 12/17/2015 8:00 AM) Atrial Fibrillation High blood pressure Kidney Joaquim Lai  Past Surgical History Conni Slipper, RN; 12/17/2015 8:00 AM) No pertinent past surgical history  Diagnostic Studies History Conni Slipper, RN; 12/17/2015 8:00 AM) Colonoscopy within last year Mammogram within last year Pap Smear 1-5 years ago  Medication History Conni Slipper, RN; 12/17/2015 8:00 AM) No Current Medications Medications Reconciled  Social History Conni Slipper, RN; 12/17/2015 8:00 AM) Caffeine use Coffee. No alcohol use No drug use Tobacco use Never smoker.  Family History Conni Slipper, RN; 12/17/2015 8:00 AM) Diabetes Mellitus Mother. Hypertension Brother, Sister. Thyroid problems Sister.  Pregnancy / Birth History Conni Slipper, RN; 12/17/2015 8:00 AM) Age at menarche 44 years. Age of menopause <44 Gravida 2 Maternal age 29-20 Para 2    Review of Systems Conni Slipper RN; 12/17/2015 8:00 AM) General Present- Weight Loss. Not Present- Appetite Loss, Chills, Fatigue, Fever, Night Sweats and Weight  Gain. Skin Present- Dryness. Not Present- Change in Wart/Mole, Hives, Jaundice, New Lesions, Non-Healing Wounds, Rash and Ulcer. HEENT Present- Seasonal Allergies. Not  Present- Earache, Hearing Loss, Hoarseness, Nose Bleed, Oral Ulcers, Ringing in the Ears, Sinus Pain, Sore Throat, Visual Disturbances, Wears glasses/contact lenses and Yellow Eyes. Respiratory Not Present- Bloody sputum, Chronic Cough, Difficulty Breathing, Snoring and Wheezing. Breast Present- Breast Pain. Not Present- Breast Mass, Nipple Discharge and Skin Changes. Cardiovascular Present- Rapid Heart Rate and Swelling of Extremities. Not Present- Chest Pain, Difficulty Breathing Lying Down, Leg Cramps, Palpitations and Shortness of Breath. Gastrointestinal Not Present- Abdominal Pain, Bloating, Bloody Stool, Change in Bowel Habits, Chronic diarrhea, Constipation, Difficulty Swallowing, Excessive gas, Gets full quickly at meals, Hemorrhoids, Indigestion, Nausea, Rectal Pain and Vomiting. Female Genitourinary Present- Frequency. Not Present- Nocturia, Painful Urination, Pelvic Pain and Urgency. Neurological Not Present- Decreased Memory, Fainting, Headaches, Numbness, Seizures, Tingling, Tremor, Trouble walking and Weakness. Psychiatric Not Present- Anxiety, Bipolar, Change in Sleep Pattern, Depression, Fearful and Frequent crying. Endocrine Not Present- Cold Intolerance, Excessive Hunger, Hair Changes, Heat Intolerance, Hot flashes and New Diabetes. Hematology Not Present- Easy Bruising, Excessive bleeding, Gland problems, HIV and Persistent Infections.  Vitals Stark Klein MD; 12/17/2015 11:02 AM) 12/17/2015 11:02 AM Weight: 176.7 lb Height: 62in Body Surface Area: 1.81 m Body Mass Index: 32.32 kg/m  Temp.: 98.28F  Pulse: 91 (Regular)  Resp.: 18 (Unlabored)  BP: 116/74 (Sitting, Left Arm, Standard)       Physical Exam Stark Klein MD; 12/17/2015 11:06 AM) General Mental Status-Alert. General Appearance-Consistent with stated age. Hydration-Well hydrated. Voice-Normal. Note: wearing mask due to cold.   Integumentary Note: scarring on breast, hands, and other  locations due to eczema.   Head and Neck Head-normocephalic, atraumatic with no lesions or palpable masses. Trachea-midline. Thyroid Gland Characteristics - normal size and consistency.  Eye Eyeball - Bilateral-Extraocular movements intact. Sclera/Conjunctiva - Bilateral-No scleral icterus.  Chest and Lung Exam Chest and lung exam reveals -quiet, even and easy respiratory effort with no use of accessory muscles and on auscultation, normal breath sounds, no adventitious sounds and normal vocal resonance. Inspection Chest Wall - Normal. Back - normal.  Breast Note: Breasts have mild ptosis bilaterally and are symmetric. There is a palpable 3 cm mass around 9-10 o'clock on the right. No LAD. No nipple retraction or skin dimpling. No nipple discharge. Scattered dense breast tissue bilaterally.   Cardiovascular Cardiovascular examination reveals -normal heart sounds, regular rate and rhythm with no murmurs and normal pedal pulses bilaterally.  Abdomen Inspection Inspection of the abdomen reveals - No Hernias. Palpation/Percussion Palpation and Percussion of the abdomen reveal - Soft, Non Tender, No Rebound tenderness, No Rigidity (guarding) and No hepatosplenomegaly. Auscultation Auscultation of the abdomen reveals - Bowel sounds normal.  Neurologic Neurologic evaluation reveals -alert and oriented x 3 with no impairment of recent or remote memory. Mental Status-Normal.  Musculoskeletal Global Assessment -Note: no gross deformities.  Normal Exam - Left-Upper Extremity Strength Normal and Lower Extremity Strength Normal. Normal Exam - Right-Upper Extremity Strength Normal and Lower Extremity Strength Normal.  Lymphatic Head & Neck  General Head & Neck Lymphatics: Bilateral - Description - Normal. Axillary  General Axillary Region: Bilateral - Description - Normal. Tenderness - Non Tender. Femoral & Inguinal  Generalized Femoral & Inguinal  Lymphatics: Bilateral - Description - No Generalized lymphadenopathy.    Assessment & Plan Stark Klein MD; 12/17/2015 11:25 AM) CARCINOMA OF UPPER-OUTER QUADRANT OF RIGHT FEMALE BREAST (C50.411) Impression: Pt has a cT2N0 new right breast cancer.  She is a good candidate for neoadjuvant chemotherapy given the triple negative nature of the breast cancer. Also, this gives [place] time for genetic counseling and testing. She will need an MRI for further assessment of the breast prior to initiation of the chemo.  I will set her up for a port placement. We reviewed risks and benefits of port as well as surgical technique. I discussed risks of bleeding, infection, malfunction, malposition, pneumothorax, cardiac issues such as going into afib with RVR, clot, etc. I discussed that this is usually outpatient, but rare circumstances could lead to staying overnight.  We will need to hold xarelto for 4 days preoperatively. I have sent Dr. Meda Coffee a message regarding holding the blood thinners.  We also discussed eventual lumpectomy and sentinel lymph node biopsy. i advised that the MRI or genetics may adjust this plan. We reviewed that breast conservation includes lumpectomy PLUS radiation. I also reviewed that mastectomy is not a 100% guarantee that breast cancer will not recur.  60 min spent in evaluation, examination, counseling, and coordination of care. >50% spent in counseling. Current Plans Pt Education - ccs port insertion education TRIPLE NEGATIVE MALIGNANT NEOPLASM OF BREAST (C50.919) ANTICOAGULATED (Z79.01) PAROXYSMAL ATRIAL FIBRILLATION WITH RAPID VENTRICULAR RESPONSE (I48.0)    Signed by Stark Klein, MD (12/17/2015 11:27 AM)

## 2015-12-23 NOTE — Interval H&P Note (Signed)
History and Physical Interval Note:  12/23/2015 10:27 AM  [name]  has presented today for surgery, with the diagnosis of RIGHT BREAST CANCER  The various methods of treatment have been discussed with the patient and family. After consideration of risks, benefits and other options for treatment, the patient has consented to  Procedure(s): INSERTION PORT-A-CATH WITH [place] (N/A) as a surgical intervention .  The patient's history has been reviewed, patient examined, no change in status, stable for surgery.  I have reviewed the patient's chart and labs.  Questions were answered to the patient's satisfaction.     [name]

## 2015-12-23 NOTE — Anesthesia Procedure Notes (Signed)
Procedure Name: Intubation Performed by: Gean Maidens Pre-anesthesia Checklist: Patient identified, Emergency Drugs available, Suction available, Patient being monitored and Timeout performed Patient Re-evaluated:Patient Re-evaluated prior to inductionOxygen Delivery Method: Circle system utilized Preoxygenation: Pre-oxygenation with 100% oxygen Intubation Type: IV induction Ventilation: Mask ventilation without difficulty Laryngoscope Size: Mac and 3 Grade View: Grade I Tube type: Oral Tube size: 7.0 mm Number of attempts: 1 Airway Equipment and Method: Stylet Placement Confirmation: ETT inserted through vocal cords under direct vision,  positive ETCO2,  CO2 detector and breath sounds checked- equal and bilateral Secured at: 22 cm Tube secured with: Tape Dental Injury: Teeth and Oropharynx as per pre-operative assessment

## 2015-12-23 NOTE — Transfer of Care (Signed)
Immediate Anesthesia Transfer of Care Note  Patient: [name]  Procedure(s) Performed: Procedure(s): INSERTION PORT-A-CATH WITH [place] (Left)  Patient Location: PACU  Anesthesia Type:General  Level of Consciousness: sedated, patient cooperative and responds to stimulation  Airway & Oxygen Therapy: Patient Spontanous Breathing and Patient connected to face mask oxygen  Post-op Assessment: Report given to RN and Post -op Vital signs reviewed and stable  Post vital signs: Reviewed and stable  Last Vitals:  Filed Vitals:   12/23/15 0942  BP: 108/77  Pulse: 64  Temp: 36.6 C  Resp: 16    Complications: No apparent anesthesia complications

## 2015-12-23 NOTE — Op Note (Signed)
PREOPERATIVE DIAGNOSIS:  Right breast cancer, cT2N0M0; triple negative     POSTOPERATIVE DIAGNOSIS:  Same     PROCEDURE: Left subclavian port placement, Bard ClearVue  Power Port, MRI safe, 8-French.      SURGEON:  Stark Klein, MD      ANESTHESIA:  General   FINDINGS:  Good venous return, easy flush, and tip of the catheter and   SVC 21.5 cm.      SPECIMEN:  None.      ESTIMATED BLOOD LOSS:  Minimal.      COMPLICATIONS:  None known.      PROCEDURE:  Pt was identified in the holding area and taken to   the operating room, where patient was placed supine on the operating room   table.  General anesthesia was induced.  Patient's arms were tucked and the upper   chest and neck were prepped and draped in sterile fashion.  Time-out was   performed according to the surgical safety check list.  When all was   correct, we continued.   Local anesthetic was administered over this   area at the angle of the clavicle.  The vein was accessed with 2 passes of the needle. There was good venous return and the wire passed easily with no ectopy.   Fluoroscopy was used to confirm that the wire was in the vena cava.      The patient was placed back level and the area for the pocket was anethetized   with local anesthetic.  A 3-cm transverse incision was made with a #15   blade.  Cautery was used to divide the subcutaneous tissues down to the   pectoralis muscle.  An [employer] retractor was used to elevate the skin   while a pocket was created on top of the pectoralis fascia.  The port   was placed into the pocket to confirm that it was of adequate size.  The   catheter was preattached to the port.  The port was then secured to the   pectoralis fascia with four 2-0 Prolene sutures.  These were clamped and   not tied down yet.    The catheter was tunneled through to the wire exit   site.  The catheter was placed along the wire to determine what length it should be to be in the SVC.  The catheter  was cut at 21.5 cm.  The tunneler sheath and dilator were passed over the wire and the dilator and wire were removed.  The catheter was advanced through the tunneler sheath and the tunneler sheath was pulled away.  Care was taken to keep the catheter in the tunneler sheath as this occurred. This was advanced and the tunneler sheath was removed.  There was good venous   return and easy flush of the catheter.  The Prolene sutures were tied   down to the pectoral fascia.  The skin was reapproximated using 3-0   Vicryl interrupted deep dermal sutures.    Fluoroscopy was used to re-confirm good position of the catheter.  The skin   was then closed using 4-0 Monocryl in a subcuticular fashion.  The port was flushed with concentrated heparin flush as well.  The wounds were then cleaned, dried, and dressed with Dermabond.  The patient was awakened from anesthesia and taken to the PACU in stable condition.  Needle, sponge, and instrument counts were correct.               Stark Klein,  MD

## 2015-12-24 ENCOUNTER — Telehealth: Payer: Self-pay | Admitting: Hematology and Oncology

## 2015-12-24 NOTE — Telephone Encounter (Signed)
Not able to schedule patient for lab/VG/tx 3/15 as requested per Dawn, navigator (see also 3/6 pof),  due to VG overbooked. Per Dawn schedule lab/VG/tx 3/16 and lab/nadir check 3/23. Spoke with patient re change from 3/15 to 3/16 as navigator spoke with patient re 3/15 appointments. Patient aware of lab/VG/tx 3/16 @ 8 am and will get new schedule 3/9 - 3/9 ched class confirmed with patient.

## 2015-12-25 ENCOUNTER — Encounter: Payer: Self-pay | Admitting: *Deleted

## 2015-12-25 ENCOUNTER — Encounter: Payer: BC Managed Care – PPO | Admitting: Genetic Counselor

## 2015-12-25 ENCOUNTER — Encounter: Payer: Self-pay | Admitting: Internal Medicine

## 2015-12-25 ENCOUNTER — Encounter: Payer: Self-pay | Admitting: Hematology and Oncology

## 2015-12-25 ENCOUNTER — Telehealth: Payer: Self-pay | Admitting: Hematology and Oncology

## 2015-12-25 ENCOUNTER — Other Ambulatory Visit: Payer: BC Managed Care – PPO

## 2015-12-25 NOTE — Progress Notes (Signed)
Spent time with patient and her family members reviewing side effects of Adriamycin and Cytoxan and Abraxane.  See education notes

## 2015-12-25 NOTE — Progress Notes (Signed)
Patient did not come to meet with me after chemo ed class as we discussed.

## 2015-12-25 NOTE — Telephone Encounter (Signed)
Pt came in to r/s. Done..the patient ok and aware

## 2015-12-26 ENCOUNTER — Ambulatory Visit (HOSPITAL_COMMUNITY)
Admission: RE | Admit: 2015-12-26 | Discharge: 2015-12-26 | Disposition: A | Discharge status: Home / Self Care | Payer: BC Managed Care – PPO | Source: Ambulatory Visit | Attending: Internal Medicine | Admitting: Internal Medicine

## 2015-12-26 ENCOUNTER — Encounter (HOSPITAL_COMMUNITY): Payer: Self-pay | Admitting: Internal Medicine

## 2015-12-26 ENCOUNTER — Ambulatory Visit (HOSPITAL_BASED_OUTPATIENT_CLINIC_OR_DEPARTMENT_OTHER)
Admission: RE | Admit: 2015-12-26 | Discharge: 2015-12-26 | Disposition: A | Discharge status: Home / Self Care | Payer: BC Managed Care – PPO | Source: Ambulatory Visit | Attending: Hematology and Oncology | Admitting: Hematology and Oncology

## 2015-12-26 ENCOUNTER — Other Ambulatory Visit: Payer: Self-pay | Admitting: Internal Medicine

## 2015-12-26 ENCOUNTER — Ambulatory Visit (HOSPITAL_COMMUNITY): Payer: BC Managed Care – PPO

## 2015-12-26 VITALS — BP 126/64 | HR 73 | Wt 178.0 lb

## 2015-12-26 DIAGNOSIS — I4891 Unspecified atrial fibrillation: Secondary | ICD-10-CM | POA: Diagnosis not present

## 2015-12-26 DIAGNOSIS — Z171 Estrogen receptor negative status [ER-]: Secondary | ICD-10-CM | POA: Diagnosis not present

## 2015-12-26 DIAGNOSIS — I480 Paroxysmal atrial fibrillation: Secondary | ICD-10-CM | POA: Diagnosis not present

## 2015-12-26 DIAGNOSIS — Z79899 Other long term (current) drug therapy: Secondary | ICD-10-CM | POA: Diagnosis not present

## 2015-12-26 DIAGNOSIS — E559 Vitamin D deficiency, unspecified: Secondary | ICD-10-CM | POA: Insufficient documentation

## 2015-12-26 DIAGNOSIS — I517 Cardiomegaly: Secondary | ICD-10-CM

## 2015-12-26 DIAGNOSIS — Z7901 Long term (current) use of anticoagulants: Secondary | ICD-10-CM | POA: Insufficient documentation

## 2015-12-26 DIAGNOSIS — Z8249 Family history of ischemic heart disease and other diseases of the circulatory system: Secondary | ICD-10-CM | POA: Diagnosis not present

## 2015-12-26 DIAGNOSIS — Z880 Allergy status to penicillin: Secondary | ICD-10-CM | POA: Diagnosis not present

## 2015-12-26 DIAGNOSIS — C50411 Malignant neoplasm of upper-outer quadrant of right female breast: Secondary | ICD-10-CM | POA: Diagnosis not present

## 2015-12-26 DIAGNOSIS — R0600 Dyspnea, unspecified: Secondary | ICD-10-CM

## 2015-12-26 DIAGNOSIS — L309 Dermatitis, unspecified: Secondary | ICD-10-CM | POA: Diagnosis not present

## 2015-12-26 DIAGNOSIS — K219 Gastro-esophageal reflux disease without esophagitis: Secondary | ICD-10-CM | POA: Insufficient documentation

## 2015-12-26 DIAGNOSIS — E7800 Pure hypercholesterolemia, unspecified: Secondary | ICD-10-CM | POA: Insufficient documentation

## 2015-12-26 DIAGNOSIS — I10 Essential (primary) hypertension: Secondary | ICD-10-CM | POA: Diagnosis not present

## 2015-12-26 DIAGNOSIS — R0609 Other forms of dyspnea: Secondary | ICD-10-CM

## 2015-12-26 MED ORDER — MONTELUKAST SODIUM 10 MG PO TABS: 10.0000 mg | ORAL_TABLET | Freq: Every day | ORAL | Status: DC

## 2015-12-26 MED ORDER — LEVOFLOXACIN 750 MG PO TABS: 750.0000 mg | ORAL_TABLET | Freq: Every day | ORAL | Status: DC

## 2015-12-26 NOTE — Progress Notes (Signed)
Patient ID: [name], female   DOB: 10-25-1955, 60 y.o.   MRN: [id number]   [place] CONSULT NOTE  Referring Physician:   HPI:  [name] is a 60 y/o woman with HTN, HL, PAF, severe eczema, kidney stones and right breast CA (diagnosed 2/17) who is referred by Dr. 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 RV function normal. Grade II DD. Lat s' 11.2 cm/s GLS -23.9%  She is hear with her daughter. She has followed with Dr. Meda Church in Cardiology for AF. She does not have known CAD. She had negative Myoview in 12/15. Denies h/o tobacco use.  Denies CP, orthopnea, or PND. Does get out of breath quickly. Recovering from cold. Starts herceptin 01/01/16. Snores mildly.   Review of Systems: [y] = yes, '[ ]'$  = no   General: Weight gain '[ ]'$ ; Weight loss '[ ]'$ ; Anorexia '[ ]'$ ; Fatigue '[ ]'$ ; Fever '[ ]'$ ; Chills '[ ]'$ ; Weakness '[ ]'$   Cardiac: Chest pain/pressure '[ ]'$ ; Resting SOB '[ ]'$ ; Exertional SOB Blue.Reese ]; Orthopnea '[ ]'$ ; Pedal Edema '[ ]'$ ; Palpitations '[ ]'$ ; Syncope '[ ]'$ ; Presyncope '[ ]'$ ; Paroxysmal nocturnal dyspnea'[ ]'$   Pulmonary: Cough '[ ]'$ ; Wheezing'[ ]'$ ; Hemoptysis'[ ]'$ ; Sputum '[ ]'$ ; Snoring Blue.Reese ]  GI: Vomiting'[ ]'$ ; Dysphagia'[ ]'$ ; Melena'[ ]'$ ; Hematochezia '[ ]'$ ; Heartburn'[ ]'$ ; Abdominal pain '[ ]'$ ; Constipation '[ ]'$ ; Diarrhea '[ ]'$ ; BRBPR '[ ]'$   GU: Hematuria'[ ]'$ ; Dysuria '[ ]'$ ; Nocturia'[ ]'$   Vascular: Pain in legs with  walking '[ ]'$ ; Pain in feet with lying flat '[ ]'$ ; Non-healing sores '[ ]'$ ; Stroke '[ ]'$ ; TIA '[ ]'$ ; Slurred speech '[ ]'$ ;  Neuro: Headaches'[ ]'$ ; Vertigo'[ ]'$ ; Seizures'[ ]'$ ; Paresthesias'[ ]'$ ;Blurred vision '[ ]'$ ; Diplopia '[ ]'$ ; Vision changes '[ ]'$   Ortho/Skin: Arthritis Blue.Reese ]; Joint pain [ y]; Muscle pain '[ ]'$ ; Joint swelling '[ ]'$ ; Back Pain '[ ]'$ ; Rash [y]  Psych: Depression'[ ]'$ ; Anxiety'[ ]'$   Heme: Bleeding problems '[ ]'$ ; Clotting disorders '[ ]'$ ; Anemia '[ ]'$   Endocrine: Diabetes '[ ]'$ ; Thyroid dysfunction'[ ]'$    Past Medical History  Diagnosis Date  . MRSA (methicillin resistant staph aureus) culture positive 2009     none since per patient  . Eczema   . Hypercholesteremia   . Hypertension   . Kidney stones   . Anemia   . Vitamin D deficiency   . Paroxysmal atrial fibrillation ([place]) 10/2011, 06/2015  . GERD (gastroesophageal reflux disease)   . Atrial fibrillation ([place]) 2016  . Dysrhythmia     A FIB - followed by Dr. Meda Church  . Breast cancer of upper-outer quadrant of right female breast ([place]) 12/12/2015  . Breast cancer ([place])   . Complication of anesthesia 10-06-15     slow to awaken after   . PONV (postoperative nausea and vomiting) 10-06-15    admitted back to hospital for dehydration    Current Outpatient Prescriptions  Medication Sig Dispense Refill  .  atenolol (TENORMIN) 100 MG tablet Take 100 mg by mouth daily.    Marland Kitchen azelastine (ASTELIN) 0.1 % nasal spray Place 1 spray into both nostrils 2 (two) times daily.    . cholecalciferol (VITAMIN D) 1000 UNITS tablet Take 4,000 Units by mouth daily.    Marland Kitchen diltiazem (CARDIZEM CD) 120 MG 24 hr capsule Take 1 capsule (120 mg total) by mouth daily. 90 capsule 3  . doxepin (SINEQUAN) 50 MG capsule take 1 capsule by mouth once daily at bedtime 30 capsule 1  . ferrous sulfate 325 (65 FE) MG tablet Take 325 mg by mouth daily with breakfast.    . oxyCODONE-acetaminophen (ROXICET) 5-325 MG tablet Take 1-2 tablets by mouth every 4 (four) hours as needed for severe pain. 30  tablet 0  . PAZEO 0.7 % SOLN Place 1 drop into both eyes daily.    . potassium chloride SA (K-DUR,KLOR-CON) 20 MEQ tablet Take 20 mEq by mouth 2 (two) times daily.    . promethazine-dextromethorphan (PROMETHAZINE-DM) 6.25-15 MG/5ML syrup Take 5 mLs by mouth 3 (three) times daily.    . rivaroxaban (XARELTO) 20 MG TABS tablet Take 1 tablet (20 mg total) by mouth daily with supper. 90 tablet 3  . triamcinolone cream (KENALOG) 0.1 % Apply 1 application topically 2 (two) times daily.    Marland Kitchen triamterene-hydrochlorothiazide (MAXZIDE-25) 37.5-25 MG tablet Take 1 tablet by mouth daily. 30 tablet 3  . dexamethasone (DECADRON) 4 MG tablet Take 1 tablets by mouth once a day on the day after chemotherapy and then take 1 tablets two times a day for 2 days. Take with food. (Patient not taking: Reported on 12/26/2015) 30 tablet 1  . levofloxacin (LEVAQUIN) 750 MG tablet Take 1 tablet (750 mg total) by mouth daily. X 7 days (Patient not taking: Reported on 12/26/2015) 7 tablet 0  . lidocaine-prilocaine (EMLA) cream Apply to affected area once (Patient not taking: Reported on 12/26/2015) 30 g 3  . LORazepam (ATIVAN) 0.5 MG tablet Take 1 tablet (0.5 mg total) by mouth at bedtime. (Patient not taking: Reported on 12/26/2015) 30 tablet 0  . montelukast (SINGULAIR) 10 MG tablet Take 1 tablet (10 mg total) by mouth daily. (Patient not taking: Reported on 12/26/2015) 30 tablet 2  . ondansetron (ZOFRAN) 8 MG tablet Take 1 tablet (8 mg total) by mouth 2 (two) times daily as needed. Start on the third day after chemotherapy. (Patient not taking: Reported on 12/26/2015) 30 tablet 1  . prochlorperazine (COMPAZINE) 10 MG tablet Take 1 tablet (10 mg total) by mouth every 6 (six) hours as needed (Nausea or vomiting). (Patient not taking: Reported on 12/26/2015) 30 tablet 1   No current facility-administered medications for this encounter.    Allergies  Allergen Reactions  . Penicillins Other (See Comments)    Unknown reaction as a  child. Has patient had a PCN reaction causing immediate rash, facial/tongue/throat swelling, SOB or lightheadedness with hypotension: No Has patient had a PCN reaction causing severe rash involving mucus membranes or skin necrosis: No Has patient had a PCN reaction that required hospitalization No Has patient had a PCN reaction occurring within the last 10 years: No If all of the above answers are "NO", then may proceed with Cephalosporin use.       Social History   Social History  . Marital Status: Widowed    Spouse Name: N/A  . Number of Children: N/A  . Years of Education: N/A   Occupational History  . Not on file.  Social History Main Topics  . Smoking status: Never Smoker   . Smokeless tobacco: Never Used  . Alcohol Use: No  . Drug Use: No  . Sexual Activity: Not on file   Other Topics Concern  . Not on file   Social History Narrative      Family History  Problem Relation Age of Onset  . Hypertension Mother   . Hepatitis C Mother   . Heart disease Father   . Dementia Father   . Aneurysm Brother     Filed Vitals:   12/26/15 1515  BP: 126/64  Pulse: 73  Weight: 178 lb (80.74 kg)  SpO2: 100%    PHYSICAL EXAM: General:  Well appearing. No respiratory difficulty HEENT: normal Neck: supple. no JVD. Carotids 2+ bilat; no bruits. No lymphadenopathy or thryomegaly appreciated. Cor: PMI nondisplaced. Regular rate & rhythm. No rubs, gallops or murmurs. Left chest port a cath Lungs: clear but decreased BS Abdomen: soft, nontender, nondistended. No hepatosplenomegaly. No bruits or masses. Good bowel sounds. Extremities: no cyanosis, clubbing, rash, edema. + diffuse eczema Neuro: alert & oriented x 3, cranial nerves grossly intact. moves all 4 extremities w/o difficulty. Affect pleasant.   ASSESSMENT & PLAN: 1. Breast cancer ER/PR - HER-2 + -Explained incidence of Herceptin cardiotoxicity and role of Cardio-oncology clinic at length. Echo images reviewed  personally. All parameters stable. Reviewed signs and symptoms of HF to look for. OK to start Herceptin. Follow-up with echo in 3 months. 2. PAF on Xarelto 3. Dilated RV on echo - will get cMRI and PFTs to further evalaute 4. Severe eczema 5. Dyspnea on exertion - see above  Roosvelt Churchwell,MD 4:00 PM

## 2015-12-26 NOTE — Progress Notes (Signed)
  Echocardiogram 2D Echocardiogram has been performed.  Johny Chess 12/26/2015, 3:09 PM

## 2015-12-26 NOTE — Progress Notes (Signed)
Advanced Heart Failure Medication Review by a Pharmacist  Does the patient  feel that his/her medications are working for him/her?  yes  Has the patient been experiencing any side effects to the medications prescribed?  no  Does the patient measure his/her own blood pressure or blood glucose at home?  no   Does the patient have any problems obtaining medications due to transportation or finances?   no  Understanding of regimen: good Understanding of indications: good Potential of compliance: good Patient understands to avoid NSAIDs. Patient understands to avoid decongestants.  Issues to address at subsequent visits: None   Pharmacist comments: 60 yo pleasant female presenting with her daughter for clinic.  Pt started on levaquin and montelukast today but she has not picked it up yet today. Pt started on compazine, zofran, ativan, decadron and emla cream for chemo but patient has not started chemo yet.  Pt reports no SE to her current regimen and no problem obtaining any of her meds.     Time with patient: 10 min  Preparation and documentation time: 5 min  Total time: 5 min

## 2015-12-26 NOTE — Patient Instructions (Signed)
Your provider requests you have pulmonary function tests.  Your provider requests you have a cardiac MRI. They will call you to schedule.  Follow up in  3 months with Dr.Bensimhon

## 2015-12-26 NOTE — Addendum Note (Signed)
Encounter addended by: Harvie Junior, CMA on: 12/26/2015  4:10 PM<BR>     Documentation filed: Visit Diagnoses, Patient Instructions Section, Dx Association, Orders

## 2015-12-31 ENCOUNTER — Ambulatory Visit: Payer: BC Managed Care – PPO

## 2015-12-31 NOTE — Assessment & Plan Note (Signed)
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 After reviewing the pathology in detail, we proceeded to discuss the different treatment options between surgery, radiation, chemotherapy, antiestrogen therapies.  Plan:  1. Genetics consultation 2. Breast MRI 12/18/15::Rt Breast: 3.2 cm X 2.9x2.4 cm ring enh mass with central necrosis; 1.9 x 1.1 cm Rt Axill LN, Stable 3 cm left breast hamartoma 3. Neoadjuvant chemotherapy with dose dense Adriamycin Cytoxan 4 followed by Abraxane weekly 12 4. Followed by breast conserving surgery 5. Followed by adjuvant radiation therapy -------------------------------------------------------------------------------------------------------------------------- Current treatment: Cycle 1 day 1 Dose dense AC I also recommended LN biopsy ECHO 12/26/15: EF 60-65% Anti-emetics were reviewed  RTC in 1 week for tox check

## 2016-01-01 ENCOUNTER — Ambulatory Visit (HOSPITAL_BASED_OUTPATIENT_CLINIC_OR_DEPARTMENT_OTHER): Payer: BC Managed Care – PPO

## 2016-01-01 ENCOUNTER — Other Ambulatory Visit: Payer: Self-pay | Admitting: *Deleted

## 2016-01-01 ENCOUNTER — Other Ambulatory Visit (HOSPITAL_BASED_OUTPATIENT_CLINIC_OR_DEPARTMENT_OTHER): Payer: BC Managed Care – PPO

## 2016-01-01 ENCOUNTER — Encounter: Payer: Self-pay | Admitting: Hematology and Oncology

## 2016-01-01 ENCOUNTER — Encounter: Payer: Self-pay | Admitting: Internal Medicine

## 2016-01-01 ENCOUNTER — Ambulatory Visit: Payer: BC Managed Care – PPO

## 2016-01-01 ENCOUNTER — Ambulatory Visit (HOSPITAL_BASED_OUTPATIENT_CLINIC_OR_DEPARTMENT_OTHER): Payer: BC Managed Care – PPO | Admitting: Hematology and Oncology

## 2016-01-01 VITALS — BP 121/65 | HR 98 | Temp 97.5°F | Ht 62.0 in | Wt 176.5 lb

## 2016-01-01 DIAGNOSIS — C50411 Malignant neoplasm of upper-outer quadrant of right female breast: Secondary | ICD-10-CM

## 2016-01-01 DIAGNOSIS — Z5111 Encounter for antineoplastic chemotherapy: Secondary | ICD-10-CM | POA: Diagnosis not present

## 2016-01-01 DIAGNOSIS — R9389 Abnormal findings on diagnostic imaging of other specified body structures: Secondary | ICD-10-CM

## 2016-01-01 DIAGNOSIS — Z95828 Presence of other vascular implants and grafts: Secondary | ICD-10-CM

## 2016-01-01 LAB — COMPREHENSIVE METABOLIC PANEL
ALT: 14 U/L (ref 0–55)
ANION GAP: 10 meq/L (ref 3–11)
AST: 15 U/L (ref 5–34)
Albumin: 3.3 g/dL — ABNORMAL LOW (ref 3.5–5.0)
Alkaline Phosphatase: 80 U/L (ref 40–150)
BILIRUBIN TOTAL: 0.36 mg/dL (ref 0.20–1.20)
BUN: 12.9 mg/dL (ref 7.0–26.0)
CALCIUM: 10.1 mg/dL (ref 8.4–10.4)
CHLORIDE: 107 meq/L (ref 98–109)
CO2: 24 meq/L (ref 22–29)
Creatinine: 0.8 mg/dL (ref 0.6–1.1)
Glucose: 91 mg/dl (ref 70–140)
Potassium: 3.6 mEq/L (ref 3.5–5.1)
Sodium: 141 mEq/L (ref 136–145)
Total Protein: 7.1 g/dL (ref 6.4–8.3)

## 2016-01-01 LAB — CBC WITH DIFFERENTIAL/PLATELET
BASO%: 0.6 % (ref 0.0–2.0)
Basophils Absolute: 0 10*3/uL (ref 0.0–0.1)
EOS%: 4.8 % (ref 0.0–7.0)
Eosinophils Absolute: 0.2 10*3/uL (ref 0.0–0.5)
HCT: 38.8 % (ref 34.8–46.6)
HGB: 12.8 g/dL (ref 11.6–15.9)
LYMPH%: 36.7 % (ref 14.0–49.7)
MCH: 29.2 pg (ref 25.1–34.0)
MCHC: 33 g/dL (ref 31.5–36.0)
MCV: 88.6 fL (ref 79.5–101.0)
MONO#: 0.8 10*3/uL (ref 0.1–0.9)
MONO%: 15 % — ABNORMAL HIGH (ref 0.0–14.0)
NEUT#: 2.2 10*3/uL (ref 1.5–6.5)
NEUT%: 42.9 % (ref 38.4–76.8)
Platelets: 276 10*3/uL (ref 145–400)
RBC: 4.38 10*6/uL (ref 3.70–5.45)
RDW: 13.8 % (ref 11.2–14.5)
WBC: 5 10*3/uL (ref 3.9–10.3)
lymph#: 1.8 10*3/uL (ref 0.9–3.3)

## 2016-01-01 MED ORDER — DOXORUBICIN HCL CHEMO IV INJECTION 2 MG/ML
60.0000 mg/m2 | Freq: Once | INTRAVENOUS | Status: AC
  Administered 2016-01-01: 112 mg via INTRAVENOUS
  Filled 2016-01-01: qty 56

## 2016-01-01 MED ORDER — HEPARIN SOD (PORK) LOCK FLUSH 100 UNIT/ML IV SOLN
500.0000 [IU] | Freq: Once | INTRAVENOUS | Status: DC | PRN
  Filled 2016-01-01: qty 5

## 2016-01-01 MED ORDER — PALONOSETRON HCL INJECTION 0.25 MG/5ML
0.2500 mg | Freq: Once | INTRAVENOUS | Status: AC
  Administered 2016-01-01: 0.25 mg via INTRAVENOUS

## 2016-01-01 MED ORDER — PEGFILGRASTIM 6 MG/0.6ML ~~LOC~~ PSKT
6.0000 mg | PREFILLED_SYRINGE | Freq: Once | SUBCUTANEOUS | Status: AC
  Administered 2016-01-01: 6 mg via SUBCUTANEOUS
  Filled 2016-01-01: qty 0.6

## 2016-01-01 MED ORDER — SODIUM CHLORIDE 0.9 % IV SOLN
600.0000 mg/m2 | Freq: Once | INTRAVENOUS | Status: AC
  Administered 2016-01-01: 1120 mg via INTRAVENOUS
  Filled 2016-01-01: qty 56

## 2016-01-01 MED ORDER — SODIUM CHLORIDE 0.9% FLUSH
10.0000 mL | INTRAVENOUS | Status: DC | PRN
  Filled 2016-01-01: qty 10

## 2016-01-01 MED ORDER — SODIUM CHLORIDE 0.9% FLUSH
10.0000 mL | INTRAVENOUS | Status: DC | PRN
  Administered 2016-01-01: 10 mL via INTRAVENOUS
  Filled 2016-01-01: qty 10

## 2016-01-01 MED ORDER — PALONOSETRON HCL INJECTION 0.25 MG/5ML
INTRAVENOUS | Status: AC
  Filled 2016-01-01: qty 5

## 2016-01-01 MED ORDER — SODIUM CHLORIDE 0.9 % IV SOLN
Freq: Once | INTRAVENOUS | Status: AC
  Administered 2016-01-01: 10:00:00 via INTRAVENOUS
  Filled 2016-01-01: qty 5

## 2016-01-01 MED ORDER — SODIUM CHLORIDE 0.9 % IV SOLN
Freq: Once | INTRAVENOUS | Status: AC
  Administered 2016-01-01: 09:00:00 via INTRAVENOUS

## 2016-01-01 NOTE — Patient Instructions (Signed)

## 2016-01-01 NOTE — Progress Notes (Signed)
Patient Care Team: Unk Pinto, MD as PCP - General (Internal Medicine) Warden Fillers, MD as Consulting Physician (Ophthalmology) Kathie Rhodes, MD as Consulting Physician (Urology) Danella Sensing, MD as Consulting Physician (Dermatology) Alfonso Patten, RN as Registered Nurse Dorothy Spark, MD as Consulting Physician (Cardiology) Sylvan Cheese, NP as Nurse Practitioner (Hematology and Oncology)  DIAGNOSIS: Breast cancer of upper-outer quadrant of right female breast [facility])   Staging form: Breast, AJCC 7th Edition     Clinical stage from 12/17/2015: Stage IIA (T2, N0, M0) - Unsigned       Staging comments: Staged at breast conference on 3.1.17  SUMMARY OF ONCOLOGIC HISTORY:   Breast cancer of upper-outer quadrant of right female breast ([place])   12/05/2015 Mammogram Right breast mammogram and ultrasound revealed 2.8 x 2.7 x 2.1 irregular hypoechoic mass at 10:00 position, few mildly prominent right extrarenal lymph nodes are identified but unchanged since 2008   12/10/2015 Initial Diagnosis Right breast biopsy 10:00 position: 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 Her port has been placed and she does not have any pain or discomfort. She underwent a breast MRI which showed an enlarged right axillary lymph node. She will need a biopsy of this lymph node.  REVIEW OF SYSTEMS:     Constitutional: Denies fevers, chills or abnormal weight loss Eyes: Denies blurriness of vision Ears, nose, mouth, throat, and face: Denies mucositis or sore throat Respiratory: Denies cough, dyspnea or wheezes Cardiovascular: Denies palpitation, chest discomfort Gastrointestinal:  Denies nausea, heartburn or change in bowel habits Skin: Diffuse eczema throughout her body Lymphatics: Denies new lymphadenopathy or easy bruising Neurological:Denies numbness, tingling or new weaknesses Behavioral/Psych: Mood is stable, no new changes  Extremities: No lower extremity edema Breast:  denies any pain or lumps or nodules in either breasts All other systems were reviewed with the patient and are negative.  I have reviewed the past medical history, past surgical history, social history and family history with the patient and they are unchanged from previous note.  ALLERGIES:  is allergic to penicillins.  MEDICATIONS:  Current Outpatient Prescriptions  Medication Sig Dispense Refill  . atenolol (TENORMIN) 100 MG tablet Take 100 mg by mouth daily.    Marland Kitchen azelastine (ASTELIN) 0.1 % nasal spray Place 1 spray into both nostrils 2 (two) times daily.    . cholecalciferol (VITAMIN D) 1000 UNITS tablet Take 4,000 Units by mouth daily.    Marland Kitchen dexamethasone (DECADRON) 4 MG tablet Take 1 tablets by mouth once a day on the day after chemotherapy and then take 1 tablets two times a day for 2 days. Take with food. (Patient not taking: Reported on 12/26/2015) 30 tablet 1  . diltiazem (CARDIZEM CD) 120 MG 24 hr capsule Take 1 capsule (120 mg total) by mouth daily. 90 capsule 3  . doxepin (SINEQUAN) 50 MG capsule take 1 capsule by mouth once daily at bedtime  30 capsule 1  . ferrous sulfate 325 (65 FE) MG tablet Take 325 mg by mouth daily with breakfast.    . levofloxacin (LEVAQUIN) 750 MG tablet Take 1 tablet (750 mg total) by mouth daily. X 7 days (Patient not taking: Reported on 12/26/2015) 7 tablet 0  .  lidocaine-prilocaine (EMLA) cream Apply to affected area once (Patient not taking: Reported on 12/26/2015) 30 g 3  . LORazepam (ATIVAN) 0.5 MG tablet Take 1 tablet (0.5 mg total) by mouth at bedtime. (Patient not taking: Reported on 12/26/2015) 30 tablet 0  . montelukast (SINGULAIR) 10 MG tablet Take 1 tablet (10 mg total) by mouth daily. (Patient not taking: Reported on 12/26/2015) 30 tablet 2  . ondansetron (ZOFRAN) 8 MG tablet Take 1 tablet (8 mg total) by mouth 2 (two) times daily as needed. Start on the third day after chemotherapy. (Patient not taking: Reported on 12/26/2015) 30 tablet 1  . oxyCODONE-acetaminophen (ROXICET) 5-325 MG tablet Take 1-2 tablets by mouth every 4 (four) hours as needed for severe pain. 30 tablet 0  . PAZEO 0.7 % SOLN Place 1 drop into both eyes daily.    . potassium chloride SA (K-DUR,KLOR-CON) 20 MEQ tablet Take 20 mEq by mouth 2 (two) times daily.    . prochlorperazine (COMPAZINE) 10 MG tablet Take 1 tablet (10 mg total) by mouth every 6 (six) hours as needed (Nausea or vomiting). (Patient not taking: Reported on 12/26/2015) 30 tablet 1  . promethazine-dextromethorphan (PROMETHAZINE-DM) 6.25-15 MG/5ML syrup Take 5 mLs by mouth 3 (three) times daily.    . rivaroxaban (XARELTO) 20 MG TABS tablet Take 1 tablet (20 mg total) by mouth daily with supper. 90 tablet 3  . triamcinolone cream (KENALOG) 0.1 % Apply 1 application topically 2 (two) times daily.    Marland Kitchen triamterene-hydrochlorothiazide (MAXZIDE-25) 37.5-25 MG tablet Take 1 tablet by mouth daily. 30 tablet 3   No current facility-administered medications for this visit.    PHYSICAL EXAMINATION: ECOG PERFORMANCE STATUS: 1 - Symptomatic but completely ambulatory  Filed Vitals:   01/01/16 0819  BP: 121/65  Pulse: 98  Temp: 97.5 F (36.4 C)   Filed Weights   01/01/16 0819  Weight: 176 lb 8 oz (80.06 kg)    GENERAL:alert, no distress and comfortable SKIN: Diffuse eczema throughout her body EYES: normal,  Conjunctiva are pink and non-injected, sclera clear OROPHARYNX:no exudate, no erythema and lips, buccal mucosa, and tongue normal  NECK: supple, thyroid normal size, non-tender, without nodularity LYMPH:  no palpable lymphadenopathy in the cervical, axillary or inguinal LUNGS: clear to auscultation and percussion with normal breathing effort HEART: regular rate & rhythm and no murmurs and no lower extremity edema ABDOMEN:abdomen soft, non-tender and normal bowel sounds MUSCULOSKELETAL:no cyanosis of digits and no clubbing  NEURO: alert & oriented x 3 with fluent speech, no focal motor/sensory deficits EXTREMITIES: No lower extremity edema  LABORATORY DATA:  I have reviewed the data as listed   Chemistry      Component Value Date/Time   NA 139 12/17/2015 0804   NA 141 10/28/2015 1508   K 3.8 12/17/2015 0804   K 4.6 10/28/2015 1508   CL 103 10/28/2015 1508   CO2 27 12/17/2015 0804   CO2 27 10/28/2015 1508   BUN 6.5* 12/17/2015 0804   BUN 16 10/28/2015 1508   CREATININE 0.9 12/17/2015 0804   CREATININE 0.80 10/28/2015 1508   CREATININE 0.81 10/13/2015 0020      Component Value Date/Time   CALCIUM 9.4 12/17/2015 0804  CALCIUM 10.3 10/28/2015 1508   ALKPHOS 72 12/17/2015 0804   ALKPHOS 62 10/13/2015 0020   AST 14 12/17/2015 0804   AST 12* 10/13/2015 0020   ALT <9 12/17/2015 0804   ALT 8* 10/13/2015 0020   BILITOT 0.39 12/17/2015 0804   BILITOT 0.7 10/13/2015 0020       Lab Results  Component Value Date   WBC 5.0 01/01/2016   HGB 12.8 01/01/2016   HCT 38.8 01/01/2016   MCV 88.6 01/01/2016   PLT 276 01/01/2016   NEUTROABS 2.2 01/01/2016     ASSESSMENT & PLAN:  Breast cancer of upper-outer quadrant of right female breast ([place]) Right breast biopsy 12/10/2015: 10:00 position: 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 After reviewing the pathology in detail, we proceeded to discuss the different treatment options between surgery, radiation, chemotherapy, antiestrogen therapies.  Plan:  1. Genetics consultation 2. Breast MRI 12/18/15::Rt Breast: 3.2 cm X 2.9x2.4 cm ring enh mass with central necrosis; 1.9 x 1.1 cm Rt Axill LN, Stable 3 cm left breast hamartoma 3. Neoadjuvant chemotherapy with dose dense Adriamycin Cytoxan 4 followed by Abraxane weekly 12 4. Followed by breast conserving surgery 5. Followed by adjuvant radiation therapy -------------------------------------------------------------------------------------------------------------------------- Current treatment: Cycle 1 day 1 Dose dense AC I also recommended LN biopsy ECHO 12/26/15: EF 60-65% Anti-emetics were reviewed  RTC in 1 week for tox check   No orders of the defined types were placed in this encounter.   The patient has a good understanding of the overall plan. she agrees with it. she will call with any problems that may develop before the next visit here.   Rulon Eisenmenger, MD 01/01/2016     Ron is

## 2016-01-01 NOTE — Patient Instructions (Addendum)
[facility] Discharge Instructions for Patients Receiving Chemotherapy  Today you received the following chemotherapy agents Adriamycin and Cytoxan.  To help prevent nausea and vomiting after your treatment, we encourage you to take your nausea medication as directed. NO ZOFRAN FOR 3 DAYS- TAKE COMPAZINE INSTEAD FOR THOSE 3 DAYS.   If you develop nausea and vomiting that is not controlled by your nausea medication, call the clinic.   BELOW ARE SYMPTOMS THAT SHOULD BE REPORTED IMMEDIATELY:  *FEVER GREATER THAN 100.5 F  *CHILLS WITH OR WITHOUT FEVER  NAUSEA AND VOMITING THAT IS NOT CONTROLLED WITH YOUR NAUSEA MEDICATION  *UNUSUAL SHORTNESS OF BREATH  *UNUSUAL BRUISING OR BLEEDING  TENDERNESS IN MOUTH AND THROAT WITH OR WITHOUT PRESENCE OF ULCERS  *URINARY PROBLEMS  *BOWEL PROBLEMS  UNUSUAL RASH Items with * indicate a potential emergency and should be followed up as soon as possible.  Feel free to call the clinic you have any questions or concerns. The clinic phone number is [phone].  Please show the [facility] at check-in to the Emergency Department and triage nurse.   Doxorubicin injection What is this medicine? DOXORUBICIN (dox oh ROO bi sin) is a chemotherapy drug. It is used to treat many kinds of cancer like Hodgkin's disease, leukemia, non-Hodgkin's lymphoma, neuroblastoma, sarcoma, and Wilms' tumor. It is also used to treat bladder cancer, breast cancer, lung cancer, ovarian cancer, stomach cancer, and thyroid cancer. This medicine may be used for other purposes; ask your health care provider or pharmacist if you have questions. What should I tell my health care provider before I take this medicine? They need to know if you have any of these conditions: -blood disorders -heart disease, recent heart attack -infection (especially a virus infection such as chickenpox, cold sores, or herpes) -irregular heartbeat -liver disease -recent or  ongoing radiation therapy -an unusual or allergic reaction to doxorubicin, other chemotherapy agents, other medicines, foods, dyes, or preservatives -pregnant or trying to get pregnant -breast-feeding How should I use this medicine? This drug is given as an infusion into a vein. It is administered in a hospital or clinic by a specially trained health care professional. If you have pain, swelling, burning or any unusual feeling around the site of your injection, tell your health care professional right away. Talk to your pediatrician regarding the use of this medicine in children. Special care may be needed. Overdosage: If you think you have taken too much of this medicine contact a poison control center or emergency room at once. NOTE: This medicine is only for you. Do not share this medicine with others. What if I miss a dose? It is important not to miss your dose. Call your doctor or health care professional if you are unable to keep an appointment. What may interact with this medicine? Do not take this medicine with any of the following medications: -cisapride -droperidol -halofantrine -pimozide -zidovudine This medicine may also interact with the following medications: -chloroquine -chlorpromazine -clarithromycin -cyclophosphamide -cyclosporine -erythromycin -medicines for depression, anxiety, or psychotic disturbances -medicines for irregular heart beat like amiodarone, bepridil, dofetilide, encainide, flecainide, propafenone, quinidine -medicines for seizures like ethotoin, fosphenytoin, phenytoin -medicines for nausea, vomiting like dolasetron, ondansetron, palonosetron -medicines to increase blood counts like filgrastim, pegfilgrastim, sargramostim -methadone -methotrexate -pentamidine -progesterone -vaccines -verapamil Talk to your doctor or health care professional before taking any of these medicines: -acetaminophen -aspirin -ibuprofen -ketoprofen -naproxen This  list may not describe all possible interactions. Give your health care provider a list of  all the medicines, herbs, non-prescription drugs, or dietary supplements you use. Also tell them if you smoke, drink alcohol, or use illegal drugs. Some items may interact with your medicine. What should I watch for while using this medicine? Your condition will be monitored carefully while you are receiving this medicine. You will need important blood work done while you are taking this medicine. This drug may make you feel generally unwell. This is not uncommon, as chemotherapy can affect healthy cells as well as cancer cells. Report any side effects. Continue your course of treatment even though you feel ill unless your doctor tells you to stop. Your urine may turn red for a few days after your dose. This is not blood. If your urine is dark or brown, call your doctor. In some cases, you may be given additional medicines to help with side effects. Follow all directions for their use. Call your doctor or health care professional for advice if you get a fever, chills or sore throat, or other symptoms of a cold or flu. Do not treat yourself. This drug decreases your body's ability to fight infections. Try to avoid being around people who are sick. This medicine may increase your risk to bruise or bleed. Call your doctor or health care professional if you notice any unusual bleeding. Be careful brushing and flossing your teeth or using a toothpick because you may get an infection or bleed more easily. If you have any dental work done, tell your dentist you are receiving this medicine. Avoid taking products that contain aspirin, acetaminophen, ibuprofen, naproxen, or ketoprofen unless instructed by your doctor. These medicines may hide a fever. Men and women of childbearing age should use effective birth control methods while using taking this medicine. Do not become pregnant while taking this medicine. There is a  potential for serious side effects to an unborn child. Talk to your health care professional or pharmacist for more information. Do not breast-feed an infant while taking this medicine. Do not let others touch your urine or other body fluids for 5 days after each treatment with this medicine. Caregivers should wear latex gloves to avoid touching body fluids during this time. There is a maximum amount of this medicine you should receive throughout your life. The amount depends on the medical condition being treated and your overall health. Your doctor will watch how much of this medicine you receive in your lifetime. Tell your doctor if you have taken this medicine before. What side effects may I notice from receiving this medicine? Side effects that you should report to your doctor or health care professional as soon as possible: -allergic reactions like skin rash, itching or hives, swelling of the face, lips, or tongue -low blood counts - this medicine may decrease the number of white blood cells, red blood cells and platelets. You may be at increased risk for infections and bleeding. -signs of infection - fever or chills, cough, sore throat, pain or difficulty passing urine -signs of decreased platelets or bleeding - bruising, pinpoint red spots on the skin, black, tarry stools, blood in the urine -signs of decreased red blood cells - unusually weak or tired, fainting spells, lightheadedness -breathing problems -chest pain -fast, irregular heartbeat -mouth sores -nausea, vomiting -pain, swelling, redness at site where injected -pain, tingling, numbness in the hands or feet -swelling of ankles, feet, or hands -unusual bleeding or bruising Side effects that usually do not require medical attention (report to your doctor or health care professional if  they continue or are bothersome): -diarrhea -facial flushing -hair loss -loss of appetite -missed menstrual periods -nail discoloration or  damage -red or watery eyes -red colored urine -stomach upset This list may not describe all possible side effects. Call your doctor for medical advice about side effects. You may report side effects to FDA at 1-800-FDA-1088. Where should I keep my medicine? This drug is given in a hospital or clinic and will not be stored at home. NOTE: This sheet is a summary. It may not cover all possible information. If you have questions about this medicine, talk to your doctor, pharmacist, or health care provider.    2016, [employer]. (2013-01-30 09:54:34)   Cyclophosphamide injection What is this medicine? CYCLOPHOSPHAMIDE (sye kloe FOSS fa mide) is a chemotherapy drug. It slows the growth of cancer cells. This medicine is used to treat many types of cancer like lymphoma, myeloma, leukemia, breast cancer, and ovarian cancer, to name a few. This medicine may be used for other purposes; ask your health care provider or pharmacist if you have questions. What should I tell my health care provider before I take this medicine? They need to know if you have any of these conditions: -blood disorders -history of other chemotherapy -infection -kidney disease -liver disease -recent or ongoing radiation therapy -tumors in the bone marrow -an unusual or allergic reaction to cyclophosphamide, other chemotherapy, other medicines, foods, dyes, or preservatives -pregnant or trying to get pregnant -breast-feeding How should I use this medicine? This drug is usually given as an injection into a vein or muscle or by infusion into a vein. It is administered in a hospital or clinic by a specially trained health care professional. Talk to your pediatrician regarding the use of this medicine in children. Special care may be needed. Overdosage: If you think you have taken too much of this medicine contact a poison control center or emergency room at once. NOTE: This medicine is only for you. Do not share  this medicine with others. What if I miss a dose? It is important not to miss your dose. Call your doctor or health care professional if you are unable to keep an appointment. What may interact with this medicine? This medicine may interact with the following medications: -amiodarone -amphotericin B -azathioprine -certain antiviral medicines for HIV or AIDS such as protease inhibitors (e.g., indinavir, ritonavir) and zidovudine -certain blood pressure medications such as benazepril, captopril, enalapril, fosinopril, lisinopril, moexipril, monopril, perindopril, quinapril, ramipril, trandolapril -certain cancer medications such as anthracyclines (e.g., daunorubicin, doxorubicin), busulfan, cytarabine, paclitaxel, pentostatin, tamoxifen, trastuzumab -certain diuretics such as chlorothiazide, chlorthalidone, hydrochlorothiazide, indapamide, metolazone -certain medicines that treat or prevent blood clots like warfarin -certain muscle relaxants such as succinylcholine -cyclosporine -etanercept -indomethacin -medicines to increase blood counts like filgrastim, pegfilgrastim, sargramostim -medicines used as general anesthesia -metronidazole -natalizumab This list may not describe all possible interactions. Give your health care provider a list of all the medicines, herbs, non-prescription drugs, or dietary supplements you use. Also tell them if you smoke, drink alcohol, or use illegal drugs. Some items may interact with your medicine. What should I watch for while using this medicine? Visit your doctor for checks on your progress. This drug may make you feel generally unwell. This is not uncommon, as chemotherapy can affect healthy cells as well as cancer cells. Report any side effects. Continue your course of treatment even though you feel ill unless your doctor tells you to stop. Drink water or other fluids as directed. Urinate often, even  at night. In some cases, you may be given additional  medicines to help with side effects. Follow all directions for their use. Call your doctor or health care professional for advice if you get a fever, chills or sore throat, or other symptoms of a cold or flu. Do not treat yourself. This drug decreases your body's ability to fight infections. Try to avoid being around people who are sick. This medicine may increase your risk to bruise or bleed. Call your doctor or health care professional if you notice any unusual bleeding. Be careful brushing and flossing your teeth or using a toothpick because you may get an infection or bleed more easily. If you have any dental work done, tell your dentist you are receiving this medicine. You may get drowsy or dizzy. Do not drive, use machinery, or do anything that needs mental alertness until you know how this medicine affects you. Do not become pregnant while taking this medicine or for 1 year after stopping it. Women should inform their doctor if they wish to become pregnant or think they might be pregnant. Men should not father a child while taking this medicine and for 4 months after stopping it. There is a potential for serious side effects to an unborn child. Talk to your health care professional or pharmacist for more information. Do not breast-feed an infant while taking this medicine. This medicine may interfere with the ability to have a child. This medicine has caused ovarian failure in some women. This medicine has caused reduced sperm counts in some men. You should talk with your doctor or health care professional if you are concerned about your fertility. If you are going to have surgery, tell your doctor or health care professional that you have taken this medicine. What side effects may I notice from receiving this medicine? Side effects that you should report to your doctor or health care professional as soon as possible: -allergic reactions like skin rash, itching or hives, swelling of the face, lips,  or tongue -low blood counts - this medicine may decrease the number of white blood cells, red blood cells and platelets. You may be at increased risk for infections and bleeding. -signs of infection - fever or chills, cough, sore throat, pain or difficulty passing urine -signs of decreased platelets or bleeding - bruising, pinpoint red spots on the skin, black, tarry stools, blood in the urine -signs of decreased red blood cells - unusually weak or tired, fainting spells, lightheadedness -breathing problems -dark urine -dizziness -palpitations -swelling of the ankles, feet, hands -trouble passing urine or change in the amount of urine -weight gain -yellowing of the eyes or skin Side effects that usually do not require medical attention (report to your doctor or health care professional if they continue or are bothersome): -changes in nail or skin color -hair loss -missed menstrual periods -mouth sores -nausea, vomiting This list may not describe all possible side effects. Call your doctor for medical advice about side effects. You may report side effects to FDA at 1-800-FDA-1088. Where should I keep my medicine? This drug is given in a hospital or clinic and will not be stored at home. NOTE: This sheet is a summary. It may not cover all possible information. If you have questions about this medicine, talk to your doctor, pharmacist, or health care provider.    2016, [employer]. (2012-08-18 16:22:58)

## 2016-01-02 ENCOUNTER — Other Ambulatory Visit: Payer: Self-pay | Admitting: *Deleted

## 2016-01-02 ENCOUNTER — Encounter: Payer: Self-pay | Admitting: *Deleted

## 2016-01-02 DIAGNOSIS — R9389 Abnormal findings on diagnostic imaging of other specified body structures: Secondary | ICD-10-CM

## 2016-01-05 ENCOUNTER — Encounter (HOSPITAL_COMMUNITY): Payer: BC Managed Care – PPO

## 2016-01-05 ENCOUNTER — Other Ambulatory Visit: Payer: Self-pay | Admitting: Internal Medicine

## 2016-01-05 ENCOUNTER — Encounter: Payer: Self-pay | Admitting: Cardiology

## 2016-01-05 ENCOUNTER — Ambulatory Visit (HOSPITAL_COMMUNITY)
Admission: RE | Admit: 2016-01-05 | Discharge: 2016-01-05 | Disposition: A | Discharge status: Home / Self Care | Payer: BC Managed Care – PPO | Source: Ambulatory Visit | Attending: Internal Medicine | Admitting: Internal Medicine

## 2016-01-05 DIAGNOSIS — R0600 Dyspnea, unspecified: Secondary | ICD-10-CM | POA: Insufficient documentation

## 2016-01-05 DIAGNOSIS — N6310 Unspecified lump in the right breast, unspecified quadrant: Secondary | ICD-10-CM

## 2016-01-05 DIAGNOSIS — R599 Enlarged lymph nodes, unspecified: Secondary | ICD-10-CM

## 2016-01-05 LAB — PULMONARY FUNCTION TEST
DL/VA % PRED: 101 %
DL/VA: 4.61 ml/min/mmHg/L
DLCO COR % PRED: 67 %
DLCO COR: 14.62 ml/min/mmHg
DLCO UNC % PRED: 66 %
DLCO unc: 14.35 ml/min/mmHg
FEF 25-75 PRE: 3.57 L/s
FEF 25-75 Post: 3.71 L/sec
FEF2575-%CHANGE-POST: 3 %
FEF2575-%PRED-POST: 195 %
FEF2575-%PRED-PRE: 187 %
FEV1-%CHANGE-POST: 5 %
FEV1-%Pred-Post: 94 %
FEV1-%Pred-Pre: 89 %
FEV1-Post: 1.79 L
FEV1-Pre: 1.7 L
FEV1FVC-%CHANGE-POST: -14 %
FEV1FVC-%PRED-PRE: 124 %
FEV6-%CHANGE-POST: 25 %
FEV6-%PRED-POST: 91 %
FEV6-%Pred-Pre: 73 %
FEV6-PRE: 1.7 L
FEV6-Post: 2.12 L
FEV6FVC-%Pred-Post: 104 %
FEV6FVC-%Pred-Pre: 104 %
FVC-%Change-Post: 23 %
FVC-%PRED-POST: 88 %
FVC-%Pred-Pre: 71 %
FVC-Post: 2.12 L
FVC-Pre: 1.72 L
POST FEV1/FVC RATIO: 84 %
PRE FEV6/FVC RATIO: 100 %
Post FEV6/FVC ratio: 100 %
Pre FEV1/FVC ratio: 98 %
RV % pred: 104 %
RV: 2 L
TLC % pred: 86 %
TLC: 4.12 L

## 2016-01-05 MED ORDER — ALBUTEROL SULFATE (2.5 MG/3ML) 0.083% IN NEBU
2.5000 mg | INHALATION_SOLUTION | Freq: Once | RESPIRATORY_TRACT | Status: AC
Start: 2016-01-05 — End: 2016-01-05
  Administered 2016-01-05: 2.5 mg via RESPIRATORY_TRACT

## 2016-01-06 ENCOUNTER — Ambulatory Visit
Admission: RE | Admit: 2016-01-06 | Discharge: 2016-01-06 | Disposition: A | Discharge status: Home / Self Care | Payer: BC Managed Care – PPO | Source: Ambulatory Visit | Attending: Internal Medicine | Admitting: Internal Medicine

## 2016-01-06 ENCOUNTER — Other Ambulatory Visit: Payer: BC Managed Care – PPO

## 2016-01-06 ENCOUNTER — Other Ambulatory Visit: Payer: Self-pay | Admitting: Internal Medicine

## 2016-01-06 ENCOUNTER — Ambulatory Visit: Payer: BC Managed Care – PPO

## 2016-01-06 ENCOUNTER — Inpatient Hospital Stay: Admission: RE | Admit: 2016-01-06 | Payer: BC Managed Care – PPO | Source: Ambulatory Visit

## 2016-01-06 DIAGNOSIS — N6310 Unspecified lump in the right breast, unspecified quadrant: Secondary | ICD-10-CM

## 2016-01-06 DIAGNOSIS — R599 Enlarged lymph nodes, unspecified: Secondary | ICD-10-CM

## 2016-01-07 ENCOUNTER — Other Ambulatory Visit: Payer: BC Managed Care – PPO

## 2016-01-07 ENCOUNTER — Other Ambulatory Visit: Payer: Self-pay

## 2016-01-07 ENCOUNTER — Ambulatory Visit: Payer: BC Managed Care – PPO | Admitting: Hematology and Oncology

## 2016-01-08 ENCOUNTER — Ambulatory Visit (HOSPITAL_BASED_OUTPATIENT_CLINIC_OR_DEPARTMENT_OTHER): Payer: BC Managed Care – PPO

## 2016-01-08 ENCOUNTER — Ambulatory Visit (HOSPITAL_BASED_OUTPATIENT_CLINIC_OR_DEPARTMENT_OTHER): Payer: BC Managed Care – PPO | Admitting: Hematology and Oncology

## 2016-01-08 ENCOUNTER — Other Ambulatory Visit (HOSPITAL_BASED_OUTPATIENT_CLINIC_OR_DEPARTMENT_OTHER): Payer: BC Managed Care – PPO

## 2016-01-08 ENCOUNTER — Telehealth: Payer: Self-pay | Admitting: Hematology and Oncology

## 2016-01-08 ENCOUNTER — Encounter: Payer: Self-pay | Admitting: Hematology and Oncology

## 2016-01-08 VITALS — BP 120/65 | HR 50 | Temp 97.8°F | Resp 17 | Wt 178.1 lb

## 2016-01-08 DIAGNOSIS — C50411 Malignant neoplasm of upper-outer quadrant of right female breast: Secondary | ICD-10-CM

## 2016-01-08 DIAGNOSIS — C773 Secondary and unspecified malignant neoplasm of axilla and upper limb lymph nodes: Secondary | ICD-10-CM | POA: Diagnosis not present

## 2016-01-08 DIAGNOSIS — D6959 Other secondary thrombocytopenia: Secondary | ICD-10-CM | POA: Diagnosis not present

## 2016-01-08 DIAGNOSIS — D701 Agranulocytosis secondary to cancer chemotherapy: Secondary | ICD-10-CM | POA: Diagnosis not present

## 2016-01-08 DIAGNOSIS — Z95828 Presence of other vascular implants and grafts: Secondary | ICD-10-CM

## 2016-01-08 LAB — CBC WITH DIFFERENTIAL/PLATELET
BASO%: 0 % (ref 0.0–2.0)
BASOS ABS: 0 10*3/uL (ref 0.0–0.1)
EOS ABS: 0 10*3/uL (ref 0.0–0.5)
EOS%: 0 % (ref 0.0–7.0)
HCT: 36.4 % (ref 34.8–46.6)
HGB: 12.1 g/dL (ref 11.6–15.9)
LYMPH%: 59.3 % — AB (ref 14.0–49.7)
MCH: 29.2 pg (ref 25.1–34.0)
MCHC: 33.2 g/dL (ref 31.5–36.0)
MCV: 87.9 fL (ref 79.5–101.0)
MONO#: 0.2 10*3/uL (ref 0.1–0.9)
MONO%: 20.9 % — AB (ref 0.0–14.0)
NEUT#: 0.2 10*3/uL — CL (ref 1.5–6.5)
NEUT%: 19.8 % — AB (ref 38.4–76.8)
PLATELETS: 100 10*3/uL — AB (ref 145–400)
RBC: 4.14 10*6/uL (ref 3.70–5.45)
RDW: 14.2 % (ref 11.2–14.5)
WBC: 0.9 10*3/uL — CL (ref 3.9–10.3)
lymph#: 0.5 10*3/uL — ABNORMAL LOW (ref 0.9–3.3)

## 2016-01-08 LAB — COMPREHENSIVE METABOLIC PANEL
ALK PHOS: 70 U/L (ref 40–150)
ALT: 22 U/L (ref 0–55)
ANION GAP: 7 meq/L (ref 3–11)
AST: 8 U/L (ref 5–34)
Albumin: 3.1 g/dL — ABNORMAL LOW (ref 3.5–5.0)
BILIRUBIN TOTAL: 0.68 mg/dL (ref 0.20–1.20)
BUN: 16.6 mg/dL (ref 7.0–26.0)
CO2: 28 meq/L (ref 22–29)
Calcium: 8.6 mg/dL (ref 8.4–10.4)
Chloride: 105 mEq/L (ref 98–109)
Creatinine: 0.7 mg/dL (ref 0.6–1.1)
GLUCOSE: 90 mg/dL (ref 70–140)
POTASSIUM: 3.9 meq/L (ref 3.5–5.1)
SODIUM: 140 meq/L (ref 136–145)
Total Protein: 6.1 g/dL — ABNORMAL LOW (ref 6.4–8.3)

## 2016-01-08 MED ORDER — HEPARIN SOD (PORK) LOCK FLUSH 100 UNIT/ML IV SOLN
500.0000 [IU] | Freq: Once | INTRAVENOUS | Status: AC
  Administered 2016-01-08: 500 [IU] via INTRAVENOUS
  Filled 2016-01-08: qty 5

## 2016-01-08 MED ORDER — SODIUM CHLORIDE 0.9% FLUSH
10.0000 mL | INTRAVENOUS | Status: DC | PRN
  Administered 2016-01-08: 10 mL via INTRAVENOUS
  Filled 2016-01-08: qty 10

## 2016-01-08 NOTE — Progress Notes (Signed)
Patient Care Team: Unk Pinto, MD as PCP - General (Internal Medicine) Warden Fillers, MD as Consulting Physician (Ophthalmology) Kathie Rhodes, MD as Consulting Physician (Urology) Danella Sensing, MD as Consulting Physician (Dermatology) Alfonso Patten, RN as Registered Nurse Dorothy Spark, MD as Consulting Physician (Cardiology) Sylvan Cheese, NP as Nurse Practitioner (Hematology and Oncology)  DIAGNOSIS: Breast cancer of upper-outer quadrant of right female breast [facility])   Staging form: Breast, AJCC 7th Edition     Clinical stage from 12/17/2015: Stage IIA (T2, N0, M0) - Unsigned       Staging comments: Staged at breast conference on 3.1.17    SUMMARY OF ONCOLOGIC HISTORY:   Breast cancer of upper-outer quadrant of right female breast ([place])   12/05/2015 Mammogram Right breast mammogram and ultrasound revealed 2.8 x 2.7 x 2.1 irregular hypoechoic mass at 10:00 position, few mildly prominent right extrarenal lymph nodes are identified but unchanged since 2008   12/10/2015 Initial Diagnosis Right breast biopsy 10:00 position: 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 Differential diagnosis complete replacement of lymph node versus direct extension    CHIEF COMPLIANT: Cycle 1 day 8 dose dense Adriamycin and Cytoxan  INTERVAL HISTORY: [name] is a [age]-year-old with the above-mentioned history of right breast cancer who underwent first cycle of chemotherapy with dose dense Adriamycin and Cytoxan last week. She apparently  had no toxicities. She did not have any nausea or vomiting. Denies any fevers or chills denies any fatigue. Her skin which has diffuse eczema has not had any changes. Recent biopsy of the right axillary lymph node was positive for breast cancer.  REVIEW OF SYSTEMS:   Constitutional: Denies fevers, chills or abnormal weight loss Eyes: Denies blurriness of vision Ears, nose, mouth, throat, and face: Denies mucositis or sore throat Respiratory: Denies cough, dyspnea or wheezes Cardiovascular: Denies palpitation, chest discomfort Gastrointestinal:  Denies nausea, heartburn or change in bowel habits Skin: Denies abnormal skin rashes Lymphatics: Denies new lymphadenopathy or easy bruising Neurological:Denies numbness, tingling or new weaknesses Behavioral/Psych: Mood is stable, no new changes  Extremities: No lower extremity edema Breast:  denies any pain or lumps or nodules in either breasts All other systems were reviewed with the patient and are negative.  I have reviewed the past medical history, past surgical history, social history and family history with the patient and they are unchanged from previous note.  ALLERGIES:  is allergic to penicillins.  MEDICATIONS:  Current Outpatient Prescriptions  Medication Sig Dispense Refill  . atenolol (TENORMIN) 100 MG tablet Take 100 mg by mouth daily.    Marland Kitchen azelastine (ASTELIN) 0.1 % nasal spray Place 1 spray into both nostrils 2 (two) times daily.    . cholecalciferol (VITAMIN D) 1000 UNITS tablet Take 4,000 Units by mouth daily.    Marland Kitchen dexamethasone (DECADRON) 4 MG tablet Take 1 tablets by mouth once a day on the day after chemotherapy and then take 1 tablets two times a day for 2 days. Take with food. (Patient not taking: Reported on 12/26/2015) 30 tablet 1  .  diltiazem (CARDIZEM CD) 120 MG 24 hr capsule Take 1 capsule (120 mg total) by mouth daily. 90 capsule 3  . doxepin (SINEQUAN) 50 MG capsule take 1 capsule by mouth once daily at bedtime 30  capsule 1  . ferrous sulfate 325 (65 FE) MG tablet Take 325 mg by mouth daily with breakfast.    . levofloxacin (LEVAQUIN) 750 MG tablet Take 1 tablet (750 mg total) by mouth daily. X 7 days (Patient not taking: Reported on 12/26/2015) 7 tablet 0  . lidocaine-prilocaine (EMLA) cream Apply to affected area once (Patient not taking: Reported on 12/26/2015) 30 g 3  . LORazepam (ATIVAN) 0.5 MG tablet Take 1 tablet (0.5 mg total) by mouth at bedtime. (Patient not taking: Reported on 12/26/2015) 30 tablet 0  . montelukast (SINGULAIR) 10 MG tablet Take 1 tablet (10 mg total) by mouth daily. (Patient not taking: Reported on 12/26/2015) 30 tablet 2  . ondansetron (ZOFRAN) 8 MG tablet Take 1 tablet (8 mg total) by mouth 2 (two) times daily as needed. Start on the third day after chemotherapy. (Patient not taking: Reported on 12/26/2015) 30 tablet 1  . oxyCODONE-acetaminophen (ROXICET) 5-325 MG tablet Take 1-2 tablets by mouth every 4 (four) hours as needed for severe pain. 30 tablet 0  . PAZEO 0.7 % SOLN Place 1 drop into both eyes daily.    . potassium chloride SA (K-DUR,KLOR-CON) 20 MEQ tablet Take 20 mEq by mouth 2 (two) times daily.    . prochlorperazine (COMPAZINE) 10 MG tablet Take 1 tablet (10 mg total) by mouth every 6 (six) hours as needed (Nausea or vomiting). (Patient not taking: Reported on 12/26/2015) 30 tablet 1  . promethazine-dextromethorphan (PROMETHAZINE-DM) 6.25-15 MG/5ML syrup Take 5 mLs by mouth 3 (three) times daily.    . rivaroxaban (XARELTO) 20 MG TABS tablet Take 1 tablet (20 mg total) by mouth daily with supper. 90 tablet 3  . triamcinolone cream (KENALOG) 0.1 % Apply 1 application topically 2 (two) times daily.    Marland Kitchen triamterene-hydrochlorothiazide (MAXZIDE-25) 37.5-25 MG tablet Take 1 tablet by mouth daily. 30 tablet 3   No current facility-administered medications for this visit.    PHYSICAL EXAMINATION: ECOG PERFORMANCE STATUS: 1 - Symptomatic but completely ambulatory  Filed  Vitals:   01/08/16 1418  BP: 120/65  Pulse: 50  Temp: 97.8 F (36.6 C)  Resp: 17   Filed Weights   01/08/16 1418  Weight: 178 lb 1.6 oz (80.786 kg)    GENERAL:alert, no distress and comfortable SKIN: skin color, texture, turgor are normal, no rashes or significant lesions EYES: normal, Conjunctiva are pink and non-injected, sclera clear OROPHARYNX:no exudate, no erythema and lips, buccal mucosa, and tongue normal  NECK: supple, thyroid normal size, non-tender, without nodularity LYMPH:  no palpable lymphadenopathy in the cervical, axillary or inguinal LUNGS: clear to auscultation and percussion with normal breathing effort HEART: regular rate & rhythm and no murmurs and no lower extremity edema ABDOMEN:abdomen soft, non-tender and normal bowel sounds MUSCULOSKELETAL:no cyanosis of digits and no clubbing  NEURO: alert & oriented x 3 with fluent speech, no focal motor/sensory deficits EXTREMITIES: No lower extremity edema   LABORATORY DATA:  I have reviewed the data as listed   Chemistry      Component Value Date/Time   NA 140 01/08/2016 1343   NA 141 10/28/2015 1508   K 3.9 01/08/2016 1343   K 4.6 10/28/2015 1508   CL 103 10/28/2015 1508   CO2 28 01/08/2016 1343   CO2 27  10/28/2015 1508   BUN 16.6 01/08/2016 1343   BUN 16 10/28/2015 1508   CREATININE 0.7 01/08/2016 1343   CREATININE 0.80 10/28/2015 1508   CREATININE 0.81 10/13/2015 0020      Component Value Date/Time   CALCIUM 8.6 01/08/2016 1343   CALCIUM 10.3 10/28/2015 1508   ALKPHOS 70 01/08/2016 1343   ALKPHOS 62 10/13/2015 0020   AST 8 01/08/2016 1343   AST 12* 10/13/2015 0020   ALT 22 01/08/2016 1343   ALT 8* 10/13/2015 0020   BILITOT 0.68 01/08/2016 1343   BILITOT 0.7 10/13/2015 0020       Lab Results  Component Value Date   WBC 0.9* 01/08/2016   HGB 12.1 01/08/2016   HCT 36.4 01/08/2016   MCV 87.9 01/08/2016   PLT 100* 01/08/2016   NEUTROABS 0.2* 01/08/2016     ASSESSMENT & PLAN:  Breast  cancer of upper-outer quadrant of right female breast ([place]) Right breast biopsy 12/10/2015: 10:00 position: 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 Genetics consultation 2. Breast MRI 12/18/15::Rt Breast: 3.2 cm X 2.9x2.4 cm ring enh mass with central necrosis; 1.9 x 1.1 cm Rt Axill LN, Stable 3 cm left breast hamartoma 3. Neoadjuvant chemotherapy with dose dense Adriamycin Cytoxan 4 followed by Abraxane weekly 12 4. Followed by breast conserving surgery 5. Followed by adjuvant radiation therapy -------------------------------------------------------------------------------------------------------------------------- Current treatment: Cycle 1 day 8 Dose dense AC I also recommended LN biopsy ECHO 12/26/15: EF 60-65%  Chemotherapy toxicities: Denies any nausea or vomiting, denies any fatigue, denies any weakness, denies any fevers or chills. 1. Neutropenia: Expected for nadir count. I provided her with neutropenic precautions. I will not change chemotherapy dosage. 2. Mild thrombocytopenia: Platelets 100  RTC in 1 week for cycle 2   No orders of the defined types were placed in this encounter.   The patient has a good understanding of the overall plan. she agrees with it. she will call with any problems that may develop before the next visit here.   Rulon Eisenmenger, MD 01/08/2016

## 2016-01-08 NOTE — Assessment & Plan Note (Signed)
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 After reviewing the pathology in detail, we proceeded to discuss the different treatment options between surgery, radiation, chemotherapy, antiestrogen therapies.  Plan:  1. Genetics consultation 2. Breast MRI 12/18/15::Rt Breast: 3.2 cm X 2.9x2.4 cm ring enh mass with central necrosis; 1.9 x 1.1 cm Rt Axill LN, Stable 3 cm left breast hamartoma 3. Neoadjuvant chemotherapy with dose dense Adriamycin Cytoxan 4 followed by Abraxane weekly 12 4. Followed by breast conserving surgery 5. Followed by adjuvant radiation therapy -------------------------------------------------------------------------------------------------------------------------- Current treatment: Cycle 1 day 8 Dose dense AC I also recommended LN biopsy ECHO 12/26/15: EF 60-65% Anti-emetics were reviewed  RTC in 1 week for tox check

## 2016-01-08 NOTE — Progress Notes (Signed)
Unable to get in to exam room prior to MD.  No assessment performed.  

## 2016-01-08 NOTE — Telephone Encounter (Signed)
Gave patient avs report and appointments for March and April.  °

## 2016-01-08 NOTE — Patient Instructions (Signed)

## 2016-01-09 ENCOUNTER — Encounter: Payer: Self-pay | Admitting: Internal Medicine

## 2016-01-12 ENCOUNTER — Telehealth (HOSPITAL_COMMUNITY): Payer: Self-pay | Admitting: *Deleted

## 2016-01-12 ENCOUNTER — Other Ambulatory Visit: Payer: Self-pay | Admitting: Hematology and Oncology

## 2016-01-12 DIAGNOSIS — R942 Abnormal results of pulmonary function studies: Secondary | ICD-10-CM

## 2016-01-12 NOTE — Telephone Encounter (Signed)
-----   Message from Jolaine Artist, MD sent at 01/05/2016  3:05 PM EDT ----- Nira Conn - PFTs with significantly depressed DLCO. May have pulmonary HTN. Please arrange for CT chest with contrast to exclude PE and to evaluate lung fields.

## 2016-01-12 NOTE — Telephone Encounter (Signed)
Notes Recorded by Scarlette Calico, RN on 01/12/2016 at 11:48 AM Pt aware and agreeable, order placed for CT, will arrange

## 2016-01-13 NOTE — Assessment & Plan Note (Signed)
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 Genetics consultation 2. Breast MRI 12/18/15::Rt Breast: 3.2 cm X 2.9x2.4 cm ring enh mass with central necrosis; 1.9 x 1.1 cm Rt Axill LN, Stable 3 cm left breast hamartoma 3. Neoadjuvant chemotherapy with dose dense Adriamycin Cytoxan 4 followed by Abraxane weekly 12 4. Followed by breast conserving surgery 5. Followed by adjuvant radiation therapy -------------------------------------------------------------------------------------------------------------------------- Current treatment: Cycle 2 day 1 Dose dense AC I also recommended LN biopsy ECHO 12/26/15: EF 60-65%  Chemotherapy toxicities: Denies any nausea or vomiting, denies any fatigue, denies any weakness, denies any fevers or chills. 1. Neutropenia: Expected for nadir count. I provided her with neutropenic precautions. I will not change chemotherapy dosage. 2. Mild thrombocytopenia: Platelets 100  RTC in 4 weeks for cycle 4

## 2016-01-14 ENCOUNTER — Ambulatory Visit (HOSPITAL_BASED_OUTPATIENT_CLINIC_OR_DEPARTMENT_OTHER): Payer: BC Managed Care – PPO

## 2016-01-14 ENCOUNTER — Telehealth: Payer: Self-pay | Admitting: Hematology and Oncology

## 2016-01-14 ENCOUNTER — Encounter: Payer: Self-pay | Admitting: Hematology and Oncology

## 2016-01-14 ENCOUNTER — Ambulatory Visit (HOSPITAL_BASED_OUTPATIENT_CLINIC_OR_DEPARTMENT_OTHER): Payer: BC Managed Care – PPO | Admitting: Hematology and Oncology

## 2016-01-14 ENCOUNTER — Ambulatory Visit: Payer: BC Managed Care – PPO

## 2016-01-14 ENCOUNTER — Other Ambulatory Visit (HOSPITAL_BASED_OUTPATIENT_CLINIC_OR_DEPARTMENT_OTHER): Payer: BC Managed Care – PPO

## 2016-01-14 VITALS — BP 120/70 | HR 57 | Temp 98.4°F | Resp 18 | Wt 181.7 lb

## 2016-01-14 DIAGNOSIS — Z5111 Encounter for antineoplastic chemotherapy: Secondary | ICD-10-CM

## 2016-01-14 DIAGNOSIS — D6959 Other secondary thrombocytopenia: Secondary | ICD-10-CM | POA: Diagnosis not present

## 2016-01-14 DIAGNOSIS — Z95828 Presence of other vascular implants and grafts: Secondary | ICD-10-CM

## 2016-01-14 DIAGNOSIS — C50411 Malignant neoplasm of upper-outer quadrant of right female breast: Secondary | ICD-10-CM

## 2016-01-14 DIAGNOSIS — D701 Agranulocytosis secondary to cancer chemotherapy: Secondary | ICD-10-CM

## 2016-01-14 DIAGNOSIS — C773 Secondary and unspecified malignant neoplasm of axilla and upper limb lymph nodes: Secondary | ICD-10-CM | POA: Diagnosis not present

## 2016-01-14 LAB — COMPREHENSIVE METABOLIC PANEL
ALT: 36 U/L (ref 0–55)
ANION GAP: 8 meq/L (ref 3–11)
AST: 14 U/L (ref 5–34)
Albumin: 3.1 g/dL — ABNORMAL LOW (ref 3.5–5.0)
Alkaline Phosphatase: 65 U/L (ref 40–150)
BUN: 14.5 mg/dL (ref 7.0–26.0)
CO2: 25 meq/L (ref 22–29)
CREATININE: 0.7 mg/dL (ref 0.6–1.1)
Calcium: 8.9 mg/dL (ref 8.4–10.4)
Chloride: 105 mEq/L (ref 98–109)
EGFR: >90 mL/min/{1.73_m2} (ref >90)
GLUCOSE: 93 mg/dL (ref 70–140)
Potassium: 4.3 mEq/L (ref 3.5–5.1)
Sodium: 138 mEq/L (ref 136–145)
TOTAL PROTEIN: 6.2 g/dL — AB (ref 6.4–8.3)

## 2016-01-14 LAB — CBC WITH DIFFERENTIAL/PLATELET
BASO%: 0.3 % (ref 0.0–2.0)
BASOS ABS: 0 10*3/uL (ref 0.0–0.1)
EOS ABS: 0 10*3/uL (ref 0.0–0.5)
EOS%: 0 % (ref 0.0–7.0)
HEMATOCRIT: 37.8 % (ref 34.8–46.6)
HEMOGLOBIN: 12.3 g/dL (ref 11.6–15.9)
LYMPH#: 0.4 10*3/uL — AB (ref 0.9–3.3)
LYMPH%: 2.8 % — ABNORMAL LOW (ref 14.0–49.7)
MCH: 28.9 pg (ref 25.1–34.0)
MCHC: 32.4 g/dL (ref 31.5–36.0)
MCV: 89.2 fL (ref 79.5–101.0)
MONO#: 1 10*3/uL — AB (ref 0.1–0.9)
MONO%: 6.7 % (ref 0.0–14.0)
NEUT%: 90.2 % — ABNORMAL HIGH (ref 38.4–76.8)
NEUTROS ABS: 13.5 10*3/uL — AB (ref 1.5–6.5)
Platelets: 193 10*3/uL (ref 145–400)
RBC: 4.24 10*6/uL (ref 3.70–5.45)
RDW: 15.2 % — AB (ref 11.2–14.5)
WBC: 14.9 10*3/uL — AB (ref 3.9–10.3)

## 2016-01-14 MED ORDER — HEPARIN SOD (PORK) LOCK FLUSH 100 UNIT/ML IV SOLN
500.0000 [IU] | Freq: Once | INTRAVENOUS | Status: AC | PRN
  Administered 2016-01-14: 500 [IU]
  Filled 2016-01-14: qty 5

## 2016-01-14 MED ORDER — SODIUM CHLORIDE 0.9 % IV SOLN
600.0000 mg/m2 | Freq: Once | INTRAVENOUS | Status: AC
  Administered 2016-01-14: 1120 mg via INTRAVENOUS
  Filled 2016-01-14: qty 56

## 2016-01-14 MED ORDER — DOXORUBICIN HCL CHEMO IV INJECTION 2 MG/ML
60.0000 mg/m2 | Freq: Once | INTRAVENOUS | Status: AC
  Administered 2016-01-14: 112 mg via INTRAVENOUS
  Filled 2016-01-14: qty 56

## 2016-01-14 MED ORDER — PALONOSETRON HCL INJECTION 0.25 MG/5ML
0.2500 mg | Freq: Once | INTRAVENOUS | Status: AC
Start: 2016-01-14 — End: 2016-01-14
  Administered 2016-01-14: 0.25 mg via INTRAVENOUS

## 2016-01-14 MED ORDER — SODIUM CHLORIDE 0.9 % IV SOLN
Freq: Once | INTRAVENOUS | Status: AC
  Administered 2016-01-14: 14:00:00 via INTRAVENOUS

## 2016-01-14 MED ORDER — SODIUM CHLORIDE 0.9% FLUSH
10.0000 mL | INTRAVENOUS | Status: DC | PRN
  Administered 2016-01-14: 10 mL via INTRAVENOUS
  Filled 2016-01-14: qty 10

## 2016-01-14 MED ORDER — SODIUM CHLORIDE 0.9% FLUSH
10.0000 mL | INTRAVENOUS | Status: DC | PRN
  Administered 2016-01-14: 10 mL
  Filled 2016-01-14: qty 10

## 2016-01-14 MED ORDER — PALONOSETRON HCL INJECTION 0.25 MG/5ML
INTRAVENOUS | Status: AC
  Filled 2016-01-14: qty 5

## 2016-01-14 MED ORDER — PEGFILGRASTIM 6 MG/0.6ML ~~LOC~~ PSKT
6.0000 mg | PREFILLED_SYRINGE | Freq: Once | SUBCUTANEOUS | Status: AC
  Administered 2016-01-14: 6 mg via SUBCUTANEOUS
  Filled 2016-01-14: qty 0.6

## 2016-01-14 MED ORDER — SODIUM CHLORIDE 0.9 % IV SOLN
Freq: Once | INTRAVENOUS | Status: AC
  Administered 2016-01-14: 14:00:00 via INTRAVENOUS
  Filled 2016-01-14: qty 5

## 2016-01-14 NOTE — Progress Notes (Signed)
Patient Care Team: Unk Pinto, MD as PCP - General (Internal Medicine) Warden Fillers, MD as Consulting Physician (Ophthalmology) Kathie Rhodes, MD as Consulting Physician (Urology) Danella Sensing, MD as Consulting Physician (Dermatology) Alfonso Patten, RN as Registered Nurse Dorothy Spark, MD as Consulting Physician (Cardiology) Sylvan Cheese, NP as Nurse Practitioner (Hematology and Oncology)  DIAGNOSIS: Breast cancer of upper-outer quadrant of right female breast [facility])   Staging form: Breast, AJCC 7th Edition     Clinical stage from 12/17/2015: Stage IIA (T2, N0, M0) - Unsigned       Staging comments: Staged at breast conference on 3.1.17    SUMMARY OF ONCOLOGIC HISTORY:   Breast cancer of upper-outer quadrant of right female breast ([place])   12/05/2015 Mammogram Right breast mammogram and ultrasound revealed 2.8 x 2.7 x 2.1 irregular hypoechoic mass at 10:00 position, few mildly prominent right extrarenal lymph nodes are identified but unchanged since 2008   12/10/2015 Initial Diagnosis Right breast biopsy 10:00 position: 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 Differential diagnosis complete replacement of lymph node versus direct extension    CHIEF COMPLIANT: cycle 2 dose dense Adriamycin and Cytoxan  INTERVAL HISTORY: [name] is a 60 year old with above-mentioned history right breast cancer currently on neoadjuvant chemotherapy with dose dense Adriamycin and Cytoxan. Today cycle 2 of treatment. She tolerated  cycle one extremely well. She did not have any nausea vomiting. She did feel mildly fatigued but her energy levels have recovered over the past 1 week. She complained of some mild numbness in the right toes which gets better with activity and exercise.  REVIEW OF SYSTEMS:   Constitutional: Denies fevers, chills or abnormal weight loss Eyes: Denies blurriness of vision Ears, nose, mouth, throat, and face: Denies mucositis or sore throat Respiratory: Denies cough, dyspnea or wheezes Cardiovascular: Denies palpitation, chest discomfort Gastrointestinal:  Denies nausea, heartburn or change in bowel habits Skin: chronic skin eczema Lymphatics: Denies new lymphadenopathy or easy bruising Neurological:Denies numbness, tingling or new weaknesses Behavioral/Psych: Mood is stable, no new changes  Extremities: No lower extremity edema  All other systems were reviewed with the patient and are negative.  I have reviewed the past medical history, past surgical history, social history and family history with the patient and they are unchanged from previous note.  ALLERGIES:  is allergic to penicillins.  MEDICATIONS:  Current Outpatient Prescriptions  Medication Sig Dispense Refill  . atenolol (TENORMIN) 100 MG tablet Take 100 mg by mouth daily.    Marland Kitchen azelastine (ASTELIN) 0.1 % nasal spray Place 1 spray into both nostrils 2 (two) times daily.    . cholecalciferol (VITAMIN D) 1000 UNITS tablet Take 4,000 Units by mouth daily.    Marland Kitchen dexamethasone (DECADRON) 4 MG tablet Take 1 tablets by mouth once a day on the day after chemotherapy and then take 1 tablets two times a day for 2 days. Take with food. (Patient not taking: Reported on 12/26/2015) 30 tablet 1  . diltiazem (CARDIZEM CD) 120 MG 24 hr capsule Take 1 capsule (120 mg  total) by mouth daily. 90 capsule 3  . doxepin (SINEQUAN) 50 MG capsule take 1 capsule by mouth once daily at bedtime 30 capsule 1  . ferrous sulfate 325 (65 FE) MG tablet Take 325 mg  by mouth daily with breakfast.    . levofloxacin (LEVAQUIN) 750 MG tablet Take 1 tablet (750 mg total) by mouth daily. X 7 days (Patient not taking: Reported on 12/26/2015) 7 tablet 0  . lidocaine-prilocaine (EMLA) cream Apply to affected area once (Patient not taking: Reported on 12/26/2015) 30 g 3  . LORazepam (ATIVAN) 0.5 MG tablet Take 1 tablet (0.5 mg total) by mouth at bedtime. (Patient not taking: Reported on 12/26/2015) 30 tablet 0  . montelukast (SINGULAIR) 10 MG tablet Take 1 tablet (10 mg total) by mouth daily. (Patient not taking: Reported on 12/26/2015) 30 tablet 2  . ondansetron (ZOFRAN) 8 MG tablet Take 1 tablet (8 mg total) by mouth 2 (two) times daily as needed. Start on the third day after chemotherapy. (Patient not taking: Reported on 12/26/2015) 30 tablet 1  . oxyCODONE-acetaminophen (ROXICET) 5-325 MG tablet Take 1-2 tablets by mouth every 4 (four) hours as needed for severe pain. 30 tablet 0  . PAZEO 0.7 % SOLN Place 1 drop into both eyes daily.    . potassium chloride SA (K-DUR,KLOR-CON) 20 MEQ tablet Take 20 mEq by mouth 2 (two) times daily.    . prochlorperazine (COMPAZINE) 10 MG tablet Take 1 tablet (10 mg total) by mouth every 6 (six) hours as needed (Nausea or vomiting). (Patient not taking: Reported on 12/26/2015) 30 tablet 1  . promethazine-dextromethorphan (PROMETHAZINE-DM) 6.25-15 MG/5ML syrup Take 5 mLs by mouth 3 (three) times daily.    . rivaroxaban (XARELTO) 20 MG TABS tablet Take 1 tablet (20 mg total) by mouth daily with supper. 90 tablet 3  . triamcinolone cream (KENALOG) 0.1 % Apply 1 application topically 2 (two) times daily.    Marland Kitchen triamterene-hydrochlorothiazide (MAXZIDE-25) 37.5-25 MG tablet Take 1 tablet by mouth daily. 30 tablet 3   No current facility-administered medications for this visit.    PHYSICAL EXAMINATION: ECOG PERFORMANCE STATUS: 1 - Symptomatic but completely ambulatory  Filed Vitals:   01/14/16 1053  BP: 120/70  Pulse: 57  Temp: 98.4 F  (36.9 C)  Resp: 18   Filed Weights   01/14/16 1053  Weight: 181 lb 11.2 oz (82.419 kg)    GENERAL:alert, no distress and comfortable SKIN: chronic eczema EYES: normal, Conjunctiva are pink and non-injected, sclera clear OROPHARYNX:no exudate, no erythema and lips, buccal mucosa, and tongue normal  NECK: supple, thyroid normal size, non-tender, without nodularity LYMPH:  no palpable lymphadenopathy in the cervical, axillary or inguinal LUNGS: clear to auscultation and percussion with normal breathing effort HEART: regular rate & rhythm and no murmurs and no lower extremity edema ABDOMEN:abdomen soft, non-tender and normal bowel sounds MUSCULOSKELETAL:no cyanosis of digits and no clubbing  NEURO: alert & oriented x 3 with fluent speech, no focal motor/sensory deficits EXTREMITIES: No lower extremity edema  LABORATORY DATA:  I have reviewed the data as listed   Chemistry      Component Value Date/Time   NA 140 01/08/2016 1343   NA 141 10/28/2015 1508   K 3.9 01/08/2016 1343   K 4.6 10/28/2015 1508   CL 103 10/28/2015 1508   CO2 28 01/08/2016 1343   CO2 27 10/28/2015 1508   BUN 16.6 01/08/2016 1343   BUN 16 10/28/2015 1508   CREATININE 0.7 01/08/2016 1343   CREATININE  0.80 10/28/2015 1508   CREATININE 0.81 10/13/2015 0020      Component Value Date/Time   CALCIUM 8.6 01/08/2016 1343   CALCIUM 10.3 10/28/2015 1508   ALKPHOS 70 01/08/2016 1343   ALKPHOS 62 10/13/2015 0020   AST 8 01/08/2016 1343   AST 12* 10/13/2015 0020   ALT 22 01/08/2016 1343   ALT 8* 10/13/2015 0020   BILITOT 0.68 01/08/2016 1343   BILITOT 0.7 10/13/2015 0020       Lab Results  Component Value Date   WBC 14.9* 01/14/2016   HGB 12.3 01/14/2016   HCT 37.8 01/14/2016   MCV 89.2 01/14/2016   PLT 193 01/14/2016   NEUTROABS 13.5* 01/14/2016     ASSESSMENT & PLAN:  Breast cancer of upper-outer quadrant of right female breast ([place]) Right breast biopsy 12/10/2015: 10:00 position: Invasive  ductal carcinoma high grade with extensive necrosis, ER PR 0%, 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 Genetics consultation 2. Breast MRI 12/18/15::Rt Breast: 3.2 cm X 2.9x2.4 cm ring enh mass with central necrosis; 1.9 x 1.1 cm Rt Axill LN, Stable 3 cm left breast hamartoma 3. Neoadjuvant chemotherapy with dose dense Adriamycin Cytoxan 4 followed by Abraxane weekly 12 4. Followed by breast conserving surgery 5. Followed by adjuvant radiation therapy -------------------------------------------------------------------------------------------------------------------------- Current treatment: Cycle 2 day 1 Dose dense AC I also recommended LN biopsy ECHO 12/26/15: EF 60-65%  Chemotherapy toxicities: Denies any nausea or vomiting, denies any fatigue, denies any weakness, denies any fevers or chills. 1. Neutropenia: Expected for nadir count. I provided her with neutropenic precautions. I will not change chemotherapy dosage. 2. Mild thrombocytopenia: Platelets 100  Return 2 weeks for cycle 3 RTC in 4 weeks for cycle 4   No orders of the defined types were placed in this encounter.   The patient has a good understanding of the overall plan. she agrees with it. she will call with any problems that may develop before the next visit here.   Rulon Eisenmenger, MD 01/14/2016

## 2016-01-14 NOTE — Progress Notes (Signed)
Unable to get in to exam room prior to MD.  No assessment performed.  

## 2016-01-14 NOTE — Telephone Encounter (Signed)
appt made and avs printed °

## 2016-01-14 NOTE — Patient Instructions (Signed)

## 2016-01-15 ENCOUNTER — Other Ambulatory Visit: Payer: Self-pay | Admitting: Pharmacist

## 2016-01-15 ENCOUNTER — Encounter: Payer: Self-pay | Admitting: Hematology and Oncology

## 2016-01-15 ENCOUNTER — Ambulatory Visit (HOSPITAL_BASED_OUTPATIENT_CLINIC_OR_DEPARTMENT_OTHER): Payer: BC Managed Care – PPO

## 2016-01-15 ENCOUNTER — Other Ambulatory Visit: Payer: Self-pay

## 2016-01-15 DIAGNOSIS — C773 Secondary and unspecified malignant neoplasm of axilla and upper limb lymph nodes: Secondary | ICD-10-CM

## 2016-01-15 DIAGNOSIS — C50411 Malignant neoplasm of upper-outer quadrant of right female breast: Secondary | ICD-10-CM | POA: Diagnosis not present

## 2016-01-15 DIAGNOSIS — D701 Agranulocytosis secondary to cancer chemotherapy: Secondary | ICD-10-CM

## 2016-01-15 MED ORDER — PEGFILGRASTIM INJECTION 6 MG/0.6ML ~~LOC~~
6.0000 mg | PREFILLED_SYRINGE | Freq: Once | SUBCUTANEOUS | Status: AC
  Administered 2016-01-15: 6 mg via SUBCUTANEOUS
  Filled 2016-01-15: qty 0.6

## 2016-01-15 MED ORDER — PEGFILGRASTIM INJECTION 6 MG/0.6ML ~~LOC~~: 6.0000 mg | PREFILLED_SYRINGE | Freq: Once | SUBCUTANEOUS | Status: DC

## 2016-01-15 NOTE — Progress Notes (Signed)
Pt walk-in to clinic - her OnPro came off.  Appt made for 3:15 pm today (chemo completed 3/29 at 315).  Pt understands time is due to insurance restrictions and to return to clinic at that time.  Appt entered.  Neulasta order entered and OnPro given to pharmacy.

## 2016-01-15 NOTE — Progress Notes (Signed)
faxed [phone] and made copy for patient. Sent to medical records

## 2016-01-15 NOTE — Progress Notes (Signed)
left in pod- left for dr. Lindi Adie to sign

## 2016-01-19 ENCOUNTER — Encounter (HOSPITAL_COMMUNITY): Payer: Self-pay | Admitting: *Deleted

## 2016-01-19 ENCOUNTER — Inpatient Hospital Stay (HOSPITAL_COMMUNITY)
Admission: EM | Admit: 2016-01-19 | Discharge: 2016-01-22 | DRG: 392 | Disposition: A | Discharge status: Home / Self Care | Payer: BC Managed Care – PPO | Attending: Internal Medicine | Admitting: Internal Medicine

## 2016-01-19 ENCOUNTER — Encounter: Payer: Self-pay | Admitting: Internal Medicine

## 2016-01-19 ENCOUNTER — Emergency Department (HOSPITAL_COMMUNITY): Payer: BC Managed Care – PPO

## 2016-01-19 DIAGNOSIS — R319 Hematuria, unspecified: Secondary | ICD-10-CM | POA: Diagnosis not present

## 2016-01-19 DIAGNOSIS — I4891 Unspecified atrial fibrillation: Secondary | ICD-10-CM | POA: Diagnosis present

## 2016-01-19 DIAGNOSIS — R197 Diarrhea, unspecified: Secondary | ICD-10-CM

## 2016-01-19 DIAGNOSIS — I480 Paroxysmal atrial fibrillation: Secondary | ICD-10-CM | POA: Diagnosis present

## 2016-01-19 DIAGNOSIS — E872 Acidosis, unspecified: Secondary | ICD-10-CM

## 2016-01-19 DIAGNOSIS — I10 Essential (primary) hypertension: Secondary | ICD-10-CM | POA: Diagnosis present

## 2016-01-19 DIAGNOSIS — Z7952 Long term (current) use of systemic steroids: Secondary | ICD-10-CM

## 2016-01-19 DIAGNOSIS — R112 Nausea with vomiting, unspecified: Secondary | ICD-10-CM

## 2016-01-19 DIAGNOSIS — E876 Hypokalemia: Secondary | ICD-10-CM | POA: Diagnosis present

## 2016-01-19 DIAGNOSIS — A0811 Acute gastroenteropathy due to Norwalk agent: Secondary | ICD-10-CM | POA: Diagnosis not present

## 2016-01-19 DIAGNOSIS — K219 Gastro-esophageal reflux disease without esophagitis: Secondary | ICD-10-CM | POA: Diagnosis present

## 2016-01-19 DIAGNOSIS — Z7901 Long term (current) use of anticoagulants: Secondary | ICD-10-CM

## 2016-01-19 DIAGNOSIS — D61818 Other pancytopenia: Secondary | ICD-10-CM | POA: Diagnosis present

## 2016-01-19 DIAGNOSIS — R1110 Vomiting, unspecified: Secondary | ICD-10-CM

## 2016-01-19 DIAGNOSIS — R1084 Generalized abdominal pain: Secondary | ICD-10-CM

## 2016-01-19 DIAGNOSIS — E782 Mixed hyperlipidemia: Secondary | ICD-10-CM | POA: Diagnosis present

## 2016-01-19 DIAGNOSIS — C50411 Malignant neoplasm of upper-outer quadrant of right female breast: Secondary | ICD-10-CM | POA: Diagnosis present

## 2016-01-19 DIAGNOSIS — I482 Chronic atrial fibrillation: Secondary | ICD-10-CM | POA: Diagnosis present

## 2016-01-19 DIAGNOSIS — N200 Calculus of kidney: Secondary | ICD-10-CM

## 2016-01-19 DIAGNOSIS — E860 Dehydration: Secondary | ICD-10-CM | POA: Diagnosis not present

## 2016-01-19 DIAGNOSIS — D899 Disorder involving the immune mechanism, unspecified: Secondary | ICD-10-CM | POA: Diagnosis present

## 2016-01-19 DIAGNOSIS — L309 Dermatitis, unspecified: Secondary | ICD-10-CM | POA: Diagnosis present

## 2016-01-19 LAB — URINALYSIS, ROUTINE W REFLEX MICROSCOPIC
BILIRUBIN URINE: NEGATIVE
Glucose, UA: NEGATIVE mg/dL
KETONES UR: 15 mg/dL — AB
NITRITE: NEGATIVE
Protein, ur: 30 mg/dL — AB
SPECIFIC GRAVITY, URINE: 1.02 (ref 1.005–1.030)
pH: 7.5 (ref 5.0–8.0)

## 2016-01-19 LAB — CBC
HCT: 38.2 % (ref 36.0–46.0)
Hemoglobin: 12.7 g/dL (ref 12.0–15.0)
MCH: 30 pg (ref 26.0–34.0)
MCHC: 33.2 g/dL (ref 30.0–36.0)
MCV: 90.3 fL (ref 78.0–100.0)
PLATELETS: 128 10*3/uL — AB (ref 150–400)
RBC: 4.23 MIL/uL (ref 3.87–5.11)
RDW: 16.4 % — AB (ref 11.5–15.5)
WBC: 12.3 10*3/uL — ABNORMAL HIGH (ref 4.0–10.5)

## 2016-01-19 LAB — COMPREHENSIVE METABOLIC PANEL
ALK PHOS: 87 U/L (ref 38–126)
ALT: 34 U/L (ref 14–54)
AST: 26 U/L (ref 15–41)
Albumin: 3.1 g/dL — ABNORMAL LOW (ref 3.5–5.0)
Anion gap: 15 (ref 5–15)
BUN: 20 mg/dL (ref 6–20)
CALCIUM: 8.7 mg/dL — AB (ref 8.9–10.3)
CHLORIDE: 100 mmol/L — AB (ref 101–111)
CO2: 25 mmol/L (ref 22–32)
CREATININE: 0.65 mg/dL (ref 0.44–1.00)
Glucose, Bld: 116 mg/dL — ABNORMAL HIGH (ref 65–99)
Potassium: 4.1 mmol/L (ref 3.5–5.1)
SODIUM: 140 mmol/L (ref 135–145)
Total Bilirubin: 0.5 mg/dL (ref 0.3–1.2)
Total Protein: 5.6 g/dL — ABNORMAL LOW (ref 6.5–8.1)

## 2016-01-19 LAB — CBG MONITORING, ED: GLUCOSE-CAPILLARY: 100 mg/dL — AB (ref 65–99)

## 2016-01-19 LAB — I-STAT CG4 LACTIC ACID, ED
LACTIC ACID, VENOUS: 4.61 mmol/L — AB (ref 0.5–2.0)
Lactic Acid, Venous: 3.6 mmol/L (ref 0.5–2.0)

## 2016-01-19 LAB — PROTIME-INR
INR: 1.11 (ref 0.00–1.49)
PROTHROMBIN TIME: 14.5 s (ref 11.6–15.2)

## 2016-01-19 LAB — URINE MICROSCOPIC-ADD ON

## 2016-01-19 LAB — C DIFFICILE QUICK SCREEN W PCR REFLEX
C DIFFICILE (CDIFF) INTERP: NEGATIVE
C DIFFICILE (CDIFF) TOXIN: NEGATIVE
C Diff antigen: NEGATIVE

## 2016-01-19 LAB — LIPASE, BLOOD: Lipase: 20 U/L (ref 11–51)

## 2016-01-19 MED ORDER — SODIUM CHLORIDE 0.9 % IV SOLN
INTRAVENOUS | Status: DC
  Administered 2016-01-19: 16:00:00 via INTRAVENOUS
  Administered 2016-01-20: 1000 mL via INTRAVENOUS
  Administered 2016-01-20 – 2016-01-22 (×4): via INTRAVENOUS

## 2016-01-19 MED ORDER — MORPHINE SULFATE (PF) 4 MG/ML IV SOLN
4.0000 mg | Freq: Once | INTRAVENOUS | Status: AC
Start: 2016-01-19 — End: 2016-01-19
  Administered 2016-01-19: 4 mg via INTRAVENOUS
  Filled 2016-01-19: qty 1

## 2016-01-19 MED ORDER — IOPAMIDOL (ISOVUE-370) INJECTION 76%
INTRAVENOUS | Status: AC
  Administered 2016-01-19: 100 mL via INTRAVENOUS
  Filled 2016-01-19: qty 100

## 2016-01-19 MED ORDER — METOCLOPRAMIDE HCL 5 MG/ML IJ SOLN
10.0000 mg | Freq: Once | INTRAMUSCULAR | Status: AC
  Administered 2016-01-19: 10 mg via INTRAVENOUS
  Filled 2016-01-19: qty 2

## 2016-01-19 MED ORDER — ACETAMINOPHEN 650 MG RE SUPP: 650.0000 mg | Freq: Four times a day (QID) | RECTAL | Status: DC | PRN

## 2016-01-19 MED ORDER — SODIUM CHLORIDE 0.9 % IV BOLUS (SEPSIS)
1000.0000 mL | Freq: Once | INTRAVENOUS | Status: AC
  Administered 2016-01-19: 1000 mL via INTRAVENOUS

## 2016-01-19 MED ORDER — OXYCODONE-ACETAMINOPHEN 5-325 MG PO TABS
1.0000 | ORAL_TABLET | Freq: Once | ORAL | Status: AC
  Administered 2016-01-19: 1 via ORAL
  Filled 2016-01-19: qty 1

## 2016-01-19 MED ORDER — ONDANSETRON HCL 4 MG/2ML IJ SOLN
4.0000 mg | Freq: Once | INTRAMUSCULAR | Status: DC | PRN
Start: 2016-01-19 — End: 2016-01-21

## 2016-01-19 MED ORDER — ACETAMINOPHEN 325 MG PO TABS: 650.0000 mg | ORAL_TABLET | Freq: Four times a day (QID) | ORAL | Status: DC | PRN

## 2016-01-19 MED ORDER — MORPHINE SULFATE (PF) 2 MG/ML IV SOLN
1.0000 mg | INTRAVENOUS | Status: DC | PRN
  Administered 2016-01-19 – 2016-01-21 (×3): 1 mg via INTRAVENOUS
  Filled 2016-01-19 (×3): qty 1

## 2016-01-19 MED ORDER — MORPHINE SULFATE (PF) 4 MG/ML IV SOLN
4.0000 mg | Freq: Once | INTRAVENOUS | Status: AC
  Administered 2016-01-19: 4 mg via INTRAVENOUS
  Filled 2016-01-19: qty 1

## 2016-01-19 MED ORDER — DIAZEPAM 5 MG PO TABS
5.0000 mg | ORAL_TABLET | Freq: Every day | ORAL | Status: DC
  Administered 2016-01-19 – 2016-01-22 (×4): 5 mg via ORAL
  Filled 2016-01-19 (×4): qty 1

## 2016-01-19 MED ORDER — TRIAMTERENE-HCTZ 37.5-25 MG PO TABS: 1.0000 | ORAL_TABLET | Freq: Every day | ORAL | Status: DC

## 2016-01-19 MED ORDER — ONDANSETRON HCL 4 MG/2ML IJ SOLN
4.0000 mg | Freq: Four times a day (QID) | INTRAMUSCULAR | Status: DC | PRN
  Administered 2016-01-20: 4 mg via INTRAVENOUS
  Filled 2016-01-19: qty 2

## 2016-01-19 MED ORDER — ONDANSETRON HCL 4 MG/2ML IJ SOLN
4.0000 mg | Freq: Once | INTRAMUSCULAR | Status: AC
  Administered 2016-01-19: 4 mg via INTRAVENOUS
  Filled 2016-01-19: qty 2

## 2016-01-19 MED ORDER — HYDROCODONE-ACETAMINOPHEN 5-325 MG PO TABS
1.0000 | ORAL_TABLET | ORAL | Status: DC | PRN
  Administered 2016-01-20 (×3): 1 via ORAL
  Administered 2016-01-21 (×2): 2 via ORAL
  Filled 2016-01-19: qty 1
  Filled 2016-01-19: qty 2
  Filled 2016-01-19: qty 1
  Filled 2016-01-19: qty 2
  Filled 2016-01-19: qty 1
  Filled 2016-01-19: qty 2

## 2016-01-19 MED ORDER — DILTIAZEM HCL ER COATED BEADS 120 MG PO CP24
120.0000 mg | ORAL_CAPSULE | Freq: Every day | ORAL | Status: DC
  Administered 2016-01-19 – 2016-01-22 (×4): 120 mg via ORAL
  Filled 2016-01-19 (×5): qty 1

## 2016-01-19 MED ORDER — ONDANSETRON HCL 4 MG PO TABS: 4.0000 mg | ORAL_TABLET | Freq: Four times a day (QID) | ORAL | Status: DC | PRN

## 2016-01-19 MED ORDER — ATENOLOL 50 MG PO TABS
100.0000 mg | ORAL_TABLET | Freq: Every day | ORAL | Status: DC
  Administered 2016-01-19 – 2016-01-22 (×4): 100 mg via ORAL
  Filled 2016-01-19 (×2): qty 2
  Filled 2016-01-19: qty 4
  Filled 2016-01-19: qty 2

## 2016-01-19 NOTE — Progress Notes (Signed)
Stool sample collected from patient and all liquid in hat. Asked the patient if she has been having bowel movements like this all day the consistency of water and patient stated yes.

## 2016-01-19 NOTE — H&P (Signed)
[facility] History and Physical  [name] [id number] DOB: 07-25-1956 DOA: 01/19/2016  Referring physician:  PCP: Alesia Richards, MD   Chief Complaint: Abdominal Pain   HPI: [name] is a 60 y.o. female  with a history of R breast cancer currently on Adriamycin and Cytoxan, s/p C1 3/29 with Neulasta on 3/30, HTN, HLD, chronic anemia, Atrial Fibrillation on Xarelto, and  renal stones presenting with 1 week history of abdominal pain, worse since 1 am, diffuse accompanied by vomiting x 4-5 today, and 1 episode of lose stools. She reports similar symptoms following prior cycle of chemo. The pain is severe, 10 out of 10.  She states that it may be worse after eating.Denies any sick contacts, or recent travel. Pain is reproducible, not worse with movement.Denies a history of hepatitis. No ETOH or recreational drug use. She reports subjective fever without  chills or night sweats. No shortness of breath or chest pain. Denies dysuria but does report  hematuria. At the ED,BP 140/57 mmHg  Pulse 87  Temp(Src) 97.6 F (36.4 C) (Oral)  Resp 16  SpO2 99% . CTA of the abdomen and pelvis showed no evidence of acute bowel pathology; left nephrolithiasis; known moderate celiac origin stenosis. Lactate was negative for PE. No new stenosis. UA with significant blood. Received 2 L IVF. Lactic acid improving slowly from 4.61 to 3.6 . Pain is better controlled with  IV Morphine. CBC remarkable for mild leukocytosis at 12.3 and plts 128k, nl H/H. CMET unremarkable.   Review of Systems:  See HPI for significant positives. All other systems were reviewed and are negative.  Past Medical History  Diagnosis Date  . MRSA (methicillin resistant staph aureus) culture positive 2009     none since per patient  . Eczema   . Hypercholesteremia   . Hypertension   . Kidney stones   . Anemia   . Vitamin D deficiency   . Paroxysmal atrial fibrillation ([place]) 10/2011, 06/2015  . GERD  (gastroesophageal reflux disease)   . Atrial fibrillation ([place]) 2016  . Dysrhythmia     A FIB - followed by Dr. Meda Coffee  . Breast cancer of upper-outer quadrant of right female breast ([place]) 12/12/2015  . Breast cancer ([place])   . Complication of anesthesia 10-06-15     slow to awaken after   . PONV (postoperative nausea and vomiting) 10-06-15    admitted back to hospital for dehydration   Past Surgical History  Procedure Laterality Date  . Breast surgery Left     biopsy-neg  . Kidney stone surgery  2013  . Nephrolithotomy Right 10/06/2015    Procedure: NEPHROLITHOTOMY PERCUTANEOUS;  Surgeon: Kathie Rhodes, MD;  Location: [facility];  Service: Urology;  Laterality: Right;  . Cystoscopy with holmium laser lithotripsy Right 10/06/2015    Procedure: CYSTOSCOPY WITH HOLMIUM LASER LITHOTRIPSY;  Surgeon: Kathie Rhodes, MD;  Location: [facility];  Service: Urology;  Laterality: Right;  . Portacath placement Left 12/23/2015    Procedure: INSERTION PORT-A-CATH WITH [place];  Surgeon: Stark Klein, MD;  Location: [facility];  Service: General;  Laterality: Left;   Social History:  reports that she has never smoked. She has never used smokeless tobacco. She reports that she does not drink alcohol or use illicit drugs.  Allergies  Allergen Reactions  . Penicillins Other (See Comments)    Unknown reaction as a child. Has patient had a PCN reaction causing immediate rash, facial/tongue/throat swelling, SOB or lightheadedness with hypotension: No Has patient had a  PCN reaction causing severe rash involving mucus membranes or skin necrosis: No Has patient had a PCN reaction that required hospitalization No Has patient had a PCN reaction occurring within the last 10 years: No If all of the above answers are "NO", then may proceed with Cephalosporin use.     Family History  Problem Relation Age of Onset  . Hypertension Mother   . Hepatitis C Mother   . Heart disease Father   . Dementia Father   . Aneurysm Brother        Prior to Admission medications   Medication Sig Start Date End Date Taking? Authorizing Provider  atenolol (TENORMIN) 100 MG tablet Take 100 mg by mouth daily.   Yes Historical Provider, MD  cholecalciferol (VITAMIN D) 1000 UNITS tablet Take 4,000 Units by mouth daily.   Yes Historical Provider, MD  dexamethasone (DECADRON) 4 MG tablet Take 1 tablets by mouth once a day on the day after chemotherapy and then take 1 tablets two times a day for 2 days. Take with food. Patient taking differently: Take 4 mg by mouth See admin instructions. Take 1 tablets by mouth once a day on the day after chemotherapy and then take 1 tablets two times a day for 2 days. Take with  food. Chemotherapy on Thursdays. 12/17/15  Yes Nicholas Lose, MD  diazepam (VALIUM) 5 MG tablet Take 5 mg by mouth daily.   Yes Historical Provider, MD  diltiazem (CARDIZEM CD) 120 MG 24 hr capsule Take 1 capsule (120 mg total) by mouth daily. 10/24/15  Yes Dorothy Spark, MD  doxepin (SINEQUAN) 50 MG capsule take 1 capsule by mouth once daily at bedtime 12/03/15  Yes Vicie Mutters, PA-C  ferrous sulfate 325 (65 FE) MG tablet Take 325 mg by mouth daily with breakfast.   Yes Historical Provider, MD  montelukast (SINGULAIR) 10 MG tablet Take 1 tablet (10 mg total) by mouth daily. Patient taking differently: Take 10 mg by mouth daily as needed (allergies).  12/26/15 12/25/16 Yes Courtney Forcucci, PA-C  ondansetron (ZOFRAN) 8 MG tablet Take 1 tablet (8 mg total) by mouth 2 (two) times daily as needed. Start on the third day after chemotherapy. 12/17/15  Yes Nicholas Lose, MD  PAZEO 0.7 % SOLN Place 1 drop into both eyes daily as needed (allergies).  12/15/15  Yes Historical Provider, MD  potassium chloride SA (K-DUR,KLOR-CON) 20 MEQ tablet Take 20 mEq by mouth 2 (two) times daily. 12/04/15  Yes Historical Provider, MD  prochlorperazine (COMPAZINE) 10 MG tablet Take 1 tablet (10 mg total) by mouth every 6 (six) hours as needed (Nausea or vomiting).  12/17/15  Yes Nicholas Lose, MD  rivaroxaban (XARELTO) 20 MG TABS tablet Take 1 tablet (20 mg total) by mouth daily with supper. 10/24/15  Yes Dorothy Spark, MD  triamcinolone cream (KENALOG) 0.1 % Apply 1 application topically 2 (two) times daily. 11/25/15  Yes Historical Provider, MD  triamterene-hydrochlorothiazide (MAXZIDE-25) 37.5-25 MG tablet Take 1 tablet by mouth daily. 12/15/15  Yes Starlyn Skeans, PA-C   Physical Exam: Filed Vitals:   01/19/16 1130 01/19/16 1200 01/19/16 1230 01/19/16 1300  BP: 143/62 150/61 147/61 140/57  Pulse: 88 95 80 87  Temp:      TempSrc:      Resp: 16 17 20 16   SpO2: 100% 99% 93% 99%    Wt Readings from Last 3 Encounters:  01/14/16 82.419 kg (181 lb 11.2 oz)  01/08/16 80.786 kg (178 lb 1.6 oz)  01/01/16 80.06  kg (176 lb 8 oz)    General: Appears calm and comfortable Eyes:  PERRL, EOMI, normal lids, iris ENT: grossly normal hearing, lips & tongue Neck: no lymphadenopathy, masses or thyromegaly Cardiovascular: regular rate and rythm, no murmurs, rubs or gallops. No lower extremity edema . Left port normal  Respiratory: clear to auscultation bilaterally, no wheezing, rhonhci or rales. Normal respiratory effort. Abdomen: soft, diffusely tender, active  bowel sounds. Skin: no rash or induration seen on limited exam. No open lesions. Known eczema.  Musculoskeletal:  grossly normal tone in both upper and lower extremities Psychiatric: grossly normal mood and affect, speech fluent and appropriate Neurologic: CN 2-12 grossly intact, moves all extremities in coordinated fashion.          Labs on Admission:  Basic Metabolic Panel:  Recent Labs Lab 01/14/16 1034 01/19/16 0730  NA 138 140  K 4.3 4.1  CL  --  100*  CO2 25 25  GLUCOSE 93 116*  BUN 14.5 20  CREATININE 0.7 0.65  CALCIUM 8.9 8.7*    Liver Function Tests:  Recent Labs Lab 01/14/16 1034 01/19/16 0730  AST 14 26  ALT 36 34  ALKPHOS 65 87  BILITOT <0.30 0.5  PROT 6.2* 5.6*    ALBUMIN 3.1* 3.1*    Recent Labs Lab 01/19/16 0730  LIPASE 20   No results for input(s): AMMONIA in the last 168 hours.  CBC:  Recent Labs Lab 01/14/16 1034 01/19/16 0730  WBC 14.9* 12.3*  NEUTROABS 13.5*  --   HGB 12.3 12.7  HCT 37.8 38.2  MCV 89.2 90.3  PLT 193 128*    Cardiac Enzymes: No results for input(s): CKTOTAL, CKMB, CKMBINDEX, TROPONINI in the last 168 hours.  BNP (last 3 results) No results for input(s): BNP in the last 8760 hours.  ProBNP (last 3 results) No results for input(s): PROBNP in the last 8760 hours.   CREATININE: 0.65 (01/19/16 0730) Estimated creatinine clearance - 74.4 mL/min  CBG:  Recent Labs Lab 01/19/16 0650  GLUCAP 100*    Radiological Exams on Admission: Ct Cta Abd/pel W/cm &/or W/o Cm  01/19/2016  CLINICAL DATA:  Generalized mid abdominal pain with vomiting for 1 week, worse this morning. Elevated lactate. Currently undergoing chemotherapy for breast cancer. EXAM: CTA ABDOMEN AND PELVIS WITH CONTRAST TECHNIQUE: Multidetector CT imaging of the abdomen and pelvis was performed using the standard protocol during bolus administration of intravenous contrast. Multiplanar reconstructed images and MIPs were obtained and reviewed to evaluate the vascular anatomy. CONTRAST:  100 mL Isovue 370 COMPARISON:  Noncontrast CT abdomen 10/06/2015. CTA abdomen and pelvis 07/08/2015. FINDINGS: VASCULAR: The abdominal aorta is patent and normal in caliber with very mild non stenotic plaque and no evidence of dissection. The celiac axis, SMA, renal arteries, and IMA are patent. There is moderate-appearing celiac origin stenosis, slightly less prominent than what was demonstrated on the prior CTA although there is mild motion artifact on the current examination and the prior study was of higher quality. There is at most mild proximal right renal artery narrowing by atheromatous plaque. Focal calcified plaque is again seen at the IMA origin. The iliac  arteries are patent with mild non stenotic plaque bilaterally. The IVC, iliac veins, SMV, splenic vein, portal vein, and renal veins appear patent, as does the IVC. NONVASCULAR: Subsegmental atelectasis is present in the visualized lung bases. There is no pleural effusion. 1.4 cm hypodense lesion in the left hepatic lobe is unchanged and may represent a cyst. The gallbladder,  spleen, adrenal glands, and pancreas are unremarkable. Nonobstructing left lower pole renal calculi are again seen, with evaluation partially limited due to excreted contrast in the renal collecting systems and ureters. Scarring in a small cyst are noted in the left lower pole. The right ureteral stent has been removed. There is a small sliding hiatal hernia. There is no evidence of bowel obstruction or inflammation. No intraperitoneal free air, free fluid, or enlarged lymph nodes are identified. The uterus, ovaries, and bladder are unremarkable. A 3.2 cm right breast mass is partially visualized corresponding to the known malignancy. Calcified granulomas are noted in the gluteal regions bilaterally. There is grade 1 anterolisthesis of L4 on L5 due to advanced facet arthrosis. No suspicious lytic or blastic osseous lesion is identified. Review of the MIP images confirms the above findings. IMPRESSION: 1. 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 Patent SMA and IMA. 2. No evidence of acute bowel pathology by CT. 3. Left nephrolithiasis. Electronically Signed   By: Logan Bores M.D.   On: 01/19/2016 11:16    EKG: Independently reviewed.    Assessment/Plan Active Problems:   Abdominal pain  Abdominal pain unclear etiology, with nausea and vomiting  in the setting of recent chemo with AC on 3/29.  Doubt viral etiology. CT angio of the  abdomen and pelvis negative for active pathology. CBC and CMET unable to further explain her symptoms Admit to tele obs  Cdiff and GI panel Bowel rest, advance as tolerated.   Enteric precautions Pain control with IV Dilaudid  Antiemetics with Zofran  IV fluids   Hypertension  BP 140/57 mmHg  Pulse 87  Temp(Src) 97.6 F (36.4 C) (Oral)  Resp 16  SpO2 99% Controlled Continue home anti-hypertensive medications.   Anemia. Acute on chronic related to hematuria likely due to [facility] and neprolithiasis, and recent chemo on 3/29. Hemoglobin 12.7  MCV 90.3   Hold Xarelto  -Transfuse 1 unit packed red blood cells if Hb less than 7  Monitor closely with daily CBC.  Thrombocytopenia in the setting of recent chemo and active bleed Current value 128k Repeat CBC in am  No transfusion indicated at this time Hold anticoagulation.   Chronic Paroxysmal atrial fibrillation  -  CHADs2vasc = 2 on Xarelto Check INR Will hold for now due to large blood in urine.   Code Status: Full Code  DVT Prophylaxis: on SCDs due to bleeding issues. Family Communication: daughter at bedside Disposition Plan: Pending Improvement. Admitted for observation in tele bed. Expected LOS 24-48 hrs    [facility] E,PA-C [facility] [email or web address] Password [email or web address]

## 2016-01-19 NOTE — ED Notes (Addendum)
Pt c/o afib and lower abdominal pain onset at 1am. Denies chest pain. Reports NVD. Hx of breast cancer, has been getting two weeks of chemo

## 2016-01-19 NOTE — Progress Notes (Signed)
[name] is a 60 y.o. female patient admitted from ED awake, alert - oriented  X 4 - no acute distress noted.  VSS - Blood pressure 104/58, pulse 72, temperature 98.8 F (37.1 C), temperature source Oral, resp. rate 11, SpO2 100 %.    IV in place, occlusive dsg intact without redness.  Orientation to room, and floor completed with information packet given to patient/family.  Patient declined safety video at this time.  Admission INP armband ID verified with patient/family, and in place.   SR up x 2, fall assessment complete, with patient and family able to verbalize understanding of risk associated with falls, and verbalized understanding to call nsg before up out of bed.  Call light within reach, patient able to voice, and demonstrate understanding.  Skin, clean-dry- intact without evidence of bruising, or skin tears.   No evidence of skin break down noted on exam.     Will cont to eval and treat per MD orders.  Jerry Caras, RN 01/19/2016 6:36 PM

## 2016-01-19 NOTE — Progress Notes (Signed)
Report received from [facility] , [place] for admit to [phone]

## 2016-01-19 NOTE — ED Provider Notes (Signed)
CSN: [id number]     Arrival date & time 01/19/16  [id number] History   First MD Initiated Contact with Patient 01/19/16 [id number]     Chief Complaint  Patient presents with  . Atrial Fibrillation  . Abdominal Pain     (Consider location/radiation/quality/duration/timing/severity/associated sxs/prior Treatment) HPI Comments: 60yo F w/ PMH including A fib on Xarelto, breast cancer on chemo, HTN, HLD, kidney stones who p/w abdominal pain. The patient states that she has had 1 week of abdominal pain across her middle and lower abdomen. She thinks the pain may get worse after she eats. The abdominal pain got much worse overnight and she began vomiting around 1 AM. Currently, the pain is severe and constant. She denies any associated chest pain. She has had diarrhea since she began chemotherapy but denies any changes in her stool, last bowel movement was this morning. No leg bowel movements and no urinary symptoms. No fevers. No sick contacts.  Patient is a 60 y.o. female presenting with atrial fibrillation and abdominal pain. The history is provided by the patient.  Atrial Fibrillation Associated symptoms include abdominal pain.  Abdominal Pain   Past Medical History  Diagnosis Date  . MRSA (methicillin resistant staph aureus) culture positive 2009     none since per patient  . Eczema   . Hypercholesteremia   . Hypertension   . Kidney stones   . Anemia   . Vitamin D deficiency   . Paroxysmal atrial fibrillation ([place]) 10/2011, 06/2015  . GERD (gastroesophageal reflux disease)   . Atrial fibrillation ([place]) 2016  . Dysrhythmia     A FIB - followed by Dr. Meda Coffee  . Breast cancer of upper-outer quadrant of right female breast ([place]) 12/12/2015  . Breast cancer ([place])   . Complication of anesthesia 10-06-15     slow to awaken after   . PONV (postoperative nausea and vomiting) 10-06-15    admitted back to hospital for dehydration   Past Surgical History  Procedure Laterality Date  . Breast surgery  Left     biopsy-neg  . Kidney stone surgery  2013  . Nephrolithotomy Right 10/06/2015    Procedure: NEPHROLITHOTOMY PERCUTANEOUS;  Surgeon: Kathie Rhodes, MD;  Location: [facility];  Service: Urology;  Laterality: Right;  . Cystoscopy with holmium laser lithotripsy Right 10/06/2015    Procedure: CYSTOSCOPY WITH HOLMIUM LASER LITHOTRIPSY;  Surgeon: Kathie Rhodes, MD;  Location: [facility];  Service: Urology;  Laterality: Right;  . Portacath placement Left 12/23/2015    Procedure: INSERTION PORT-A-CATH WITH [place];  Surgeon: Stark Klein, MD;  Location: [facility];  Service: General;  Laterality: Left;   Family History  Problem Relation Age of Onset  . Hypertension Mother   . Hepatitis C Mother   . Heart disease Father   . Dementia Father   . Aneurysm Brother    Social History  Substance Use Topics  . Smoking status: Never Smoker   . Smokeless tobacco: Never Used  . Alcohol Use: No   OB History    No data available     Review of Systems  Gastrointestinal: Positive for abdominal pain.   10 Systems reviewed and are negative for acute change except as noted in the HPI.    Allergies  Penicillins  Home Medications   Prior to Admission medications   Medication Sig Start Date End Date Taking? Authorizing Provider  atenolol (TENORMIN) 100 MG tablet Take 100 mg by mouth daily.   Yes Historical Provider, MD  cholecalciferol (VITAMIN D) 1000  UNITS tablet Take 4,000 Units by mouth daily.   Yes Historical Provider, MD  dexamethasone (DECADRON) 4 MG tablet Take 1 tablets by mouth once a day on the day after chemotherapy and then take 1 tablets two times a day for 2 days. Take with food. Patient taking differently: Take 4 mg by mouth See admin instructions. Take 1 tablets by mouth once a day on the day after chemotherapy and then take 1 tablets two times a day for 2 days. Take with  food. Chemotherapy on Thursdays. 12/17/15  Yes Nicholas Lose, MD  diazepam (VALIUM) 5 MG tablet Take 5 mg by mouth daily.   Yes  Historical Provider, MD  diltiazem (CARDIZEM CD) 120 MG 24 hr capsule Take 1 capsule (120 mg total) by mouth daily. 10/24/15  Yes Dorothy Spark, MD  doxepin (SINEQUAN) 50 MG capsule take 1 capsule by mouth once daily at bedtime 12/03/15  Yes Vicie Mutters, PA-C  ferrous sulfate 325 (65 FE) MG tablet Take 325 mg by mouth daily with breakfast.   Yes Historical Provider, MD  montelukast (SINGULAIR) 10 MG tablet Take 1 tablet (10 mg total) by mouth daily. Patient taking differently: Take 10 mg by mouth daily as needed (allergies).  12/26/15 12/25/16 Yes Courtney Forcucci, PA-C  ondansetron (ZOFRAN) 8 MG tablet Take 1 tablet (8 mg total) by mouth 2 (two) times daily as needed. Start on the third day after chemotherapy. 12/17/15  Yes Nicholas Lose, MD  PAZEO 0.7 % SOLN Place 1 drop into both eyes daily as needed (allergies).  12/15/15  Yes Historical Provider, MD  potassium chloride SA (K-DUR,KLOR-CON) 20 MEQ tablet Take 20 mEq by mouth 2 (two) times daily. 12/04/15  Yes Historical Provider, MD  prochlorperazine (COMPAZINE) 10 MG tablet Take 1 tablet (10 mg total) by mouth every 6 (six) hours as needed (Nausea or vomiting). 12/17/15  Yes Nicholas Lose, MD  rivaroxaban (XARELTO) 20 MG TABS tablet Take 1 tablet (20 mg total) by mouth daily with supper. 10/24/15  Yes Dorothy Spark, MD  triamcinolone cream (KENALOG) 0.1 % Apply 1 application topically 2 (two) times daily. 11/25/15  Yes Historical Provider, MD  triamterene-hydrochlorothiazide (MAXZIDE-25) 37.5-25 MG tablet Take 1 tablet by mouth daily. 12/15/15  Yes Courtney Forcucci, PA-C   BP 147/61 mmHg  Pulse 80  Temp(Src) 97.6 F (36.4 C) (Oral)  Resp 20  SpO2 93% Physical Exam  Constitutional: She is oriented to person, place, and time. She appears well-developed and well-nourished. No distress.  uncomfortable  HENT:  Head: Normocephalic and atraumatic.  Mildly dry mucous membranes  Eyes: Conjunctivae are normal. Pupils are equal, round, and reactive to  light.  Neck: Neck supple.  Cardiovascular: Normal rate, regular rhythm and normal heart sounds.   No murmur heard. Pulmonary/Chest: Effort normal and breath sounds normal.  Abdominal: Soft. She exhibits no distension. There is no rebound and no guarding.  Hypoactive bowel sounds; Generalized TTP, worst in periumbilical abd and across lower abd, no peritonitis  Musculoskeletal: She exhibits no edema.  Neurological: She is alert and oriented to person, place, and time.  Fluent speech  Skin: Skin is warm. She is diaphoretic.  Psychiatric: She has a normal mood and affect. Judgment normal.  Nursing note and vitals reviewed.   ED Course  Procedures (including critical care time) Labs Review Labs Reviewed  COMPREHENSIVE METABOLIC PANEL - Abnormal; Notable for the following:    Chloride 100 (*)    Glucose, Bld 116 (*)    Calcium 8.7 (*)  Total Protein 5.6 (*)    Albumin 3.1 (*)    All other components within normal limits  CBC - Abnormal; Notable for the following:    WBC 12.3 (*)    RDW 16.4 (*)    Platelets 128 (*)    All other components within normal limits  URINALYSIS, ROUTINE W REFLEX MICROSCOPIC (NOT AT [facility]) - Abnormal; Notable for the following:    Color, Urine RED (*)    APPearance TURBID (*)    Hgb urine dipstick LARGE (*)    Ketones, ur 15 (*)    Protein, ur 30 (*)    Leukocytes, UA MODERATE (*)    All other components within normal limits  URINE MICROSCOPIC-ADD ON - Abnormal; Notable for the following:    Squamous Epithelial / LPF 0-5 (*)    Bacteria, UA RARE (*)    All other components within normal limits  CBG MONITORING, ED - Abnormal; Notable for the following:    Glucose-Capillary 100 (*)    All other components within normal limits  I-STAT CG4 LACTIC ACID, ED - Abnormal; Notable for the following:    Lactic Acid, Venous 4.61 (*)    All other components within normal limits  I-STAT CG4 LACTIC ACID, ED - Abnormal; Notable for the following:    Lactic  Acid, Venous 3.60 (*)    All other components within normal limits  CULTURE, BLOOD (ROUTINE X 2)  CULTURE, BLOOD (ROUTINE X 2)  URINE CULTURE  LIPASE, BLOOD  I-STAT CG4 LACTIC ACID, ED    Imaging Review Ct Cta Abd/pel W/cm &/or W/o Cm  01/19/2016  CLINICAL DATA:  Generalized mid abdominal pain with vomiting for 1 week, worse this morning. Elevated lactate. Currently undergoing chemotherapy for breast cancer. EXAM: CTA ABDOMEN AND PELVIS WITH CONTRAST TECHNIQUE: Multidetector CT imaging of the abdomen and pelvis was performed using the standard protocol during bolus administration of intravenous contrast. Multiplanar reconstructed images and MIPs were obtained and reviewed to evaluate the vascular anatomy. CONTRAST:  100 mL Isovue 370 COMPARISON:  Noncontrast CT abdomen 10/06/2015. CTA abdomen and pelvis 07/08/2015. FINDINGS: VASCULAR: The abdominal aorta is patent and normal in caliber with very mild non stenotic plaque and no evidence of dissection. The celiac axis, SMA, renal arteries, and IMA are patent. There is moderate-appearing celiac origin stenosis, slightly less prominent than what was demonstrated on the prior CTA although there is mild motion artifact on the current examination and the prior study was of higher quality. There is at most mild proximal right renal artery narrowing by atheromatous plaque. Focal calcified plaque is again seen at the IMA origin. The iliac arteries are patent with mild non stenotic plaque bilaterally. The IVC, iliac veins, SMV, splenic vein, portal vein, and renal veins appear patent, as does the IVC. NONVASCULAR: Subsegmental atelectasis is present in the visualized lung bases. There is no pleural effusion. 1.4 cm hypodense lesion in the left hepatic lobe is unchanged and may represent a cyst. The gallbladder, spleen, adrenal glands, and pancreas are unremarkable. Nonobstructing left lower pole renal calculi are again seen, with evaluation partially limited due to  excreted contrast in the renal collecting systems and ureters. Scarring in a small cyst are noted in the left lower pole. The right ureteral stent has been removed. There is a small sliding hiatal hernia. There is no evidence of bowel obstruction or inflammation. No intraperitoneal free air, free fluid, or enlarged lymph nodes are identified. The uterus, ovaries, and bladder are unremarkable. A 3.2  cm right breast mass is partially visualized corresponding to the known malignancy. Calcified granulomas are noted in the gluteal regions bilaterally. There is grade 1 anterolisthesis of L4 on L5 due to advanced facet arthrosis. No suspicious lytic or blastic osseous lesion is identified. Review of the MIP images confirms the above findings. IMPRESSION: 1. 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 Patent SMA and IMA. 2. No evidence of acute bowel pathology by CT. 3. Left nephrolithiasis. Electronically Signed   By: Logan Bores M.D.   On: 01/19/2016 11:16   I have personally reviewed and evaluated these  lab results as part of my medical decision-making.   EKG Interpretation   Date/Time:  Monday January 19 2016 06:27:34 EDT Ventricular Rate:  70 PR Interval:  153 QRS Duration: 84 QT Interval:  411 QTC Calculation: 443 R Axis:   50 Text Interpretation:  Sinus rhythm Atrial premature complex Baseline  wander in lead(s) II III aVL aVF V1 V3 V4 V5 No significant change since  last tracing Confirmed by POLLINA  MD, CHRISTOPHER [phone]) on 01/19/2016  6:42:37 AM     Medications  ondansetron (ZOFRAN) injection 4 mg (not administered)  metoCLOPramide (REGLAN) injection 10 mg (not administered)  sodium chloride 0.9 % bolus 1,000 mL (not administered)  morphine 4 MG/ML injection 4 mg (4 mg Intravenous Given 01/19/16 0748)  ondansetron (ZOFRAN) injection 4 mg (4 mg Intravenous Given 01/19/16 0746)  sodium chloride 0.9 % bolus 1,000 mL (0 mLs Intravenous Stopped 01/19/16 0942)  iopamidol  (ISOVUE-370) 76 % injection (100 mLs Intravenous Contrast Given 01/19/16 1013)  sodium chloride 0.9 % bolus 1,000 mL (0 mLs Intravenous Stopped 01/19/16 1206)  morphine 4 MG/ML injection 4 mg (4 mg Intravenous Given 01/19/16 1051)  oxyCODONE-acetaminophen (PERCOCET/ROXICET) 5-325 MG per tablet 1 tablet (1 tablet Oral Given 01/19/16 1205)    MDM   Final diagnoses:  Generalized abdominal pain  Lactic acidosis  Non-intractable vomiting with nausea, vomiting of unspecified type  Hematuria   PT p/w 1 week of abdominal pain That became worse overnight and has been associated with vomiting. On exam, she was diaphoretic and uncomfortable but in no acute distress. Vital signs notable for hypertension. She had abdominal tenderness, generalized without peritonitis. EKG on arrival shows sinus rhythm, no ischemic changes. Obtained above lab work as well as CT scan to r/o intraabdominal process as well as evaluate for mesenteric ischemia. Gave morphine and zofran.  Initial lab work notable for lactate of 4.6, normal creatinine, UA containing a large amount of blood but no obvious signs of infection. Repeat lactate after 2 L of fluid was 3.6. CTA showed moderate celiac stenosis demonstrated on previous imaging and no acute intra-abdominal process to explain the patient's symptoms. Given the hematuria, the patient may have a nonobstructing ureteral stone but there is no evidence of hydronephrosis and her kidney function is normal today. I'm concerned about her ongoing lactic acidosis despite 2 L of fluids. She has not had any ongoing vomiting here. Her vital signs have remained stable with no fevers therefore I doubt acute infection as cause of her lactic acidosis. I discussed admission with [facility], Tye Savoy, and pt admitted for further w/u.  Sharlett Iles, MD 01/19/16 1320

## 2016-01-20 ENCOUNTER — Encounter: Payer: Self-pay | Admitting: Internal Medicine

## 2016-01-20 ENCOUNTER — Encounter: Payer: Self-pay | Admitting: Hematology and Oncology

## 2016-01-20 ENCOUNTER — Encounter (HOSPITAL_COMMUNITY): Payer: Self-pay | Admitting: General Practice

## 2016-01-20 DIAGNOSIS — R319 Hematuria, unspecified: Secondary | ICD-10-CM

## 2016-01-20 DIAGNOSIS — I482 Chronic atrial fibrillation: Secondary | ICD-10-CM | POA: Diagnosis present

## 2016-01-20 DIAGNOSIS — I10 Essential (primary) hypertension: Secondary | ICD-10-CM

## 2016-01-20 DIAGNOSIS — I480 Paroxysmal atrial fibrillation: Secondary | ICD-10-CM | POA: Diagnosis not present

## 2016-01-20 DIAGNOSIS — Z7952 Long term (current) use of systemic steroids: Secondary | ICD-10-CM | POA: Diagnosis not present

## 2016-01-20 DIAGNOSIS — D899 Disorder involving the immune mechanism, unspecified: Secondary | ICD-10-CM | POA: Diagnosis present

## 2016-01-20 DIAGNOSIS — E782 Mixed hyperlipidemia: Secondary | ICD-10-CM | POA: Diagnosis present

## 2016-01-20 DIAGNOSIS — L309 Dermatitis, unspecified: Secondary | ICD-10-CM | POA: Diagnosis present

## 2016-01-20 DIAGNOSIS — C50411 Malignant neoplasm of upper-outer quadrant of right female breast: Secondary | ICD-10-CM | POA: Diagnosis present

## 2016-01-20 DIAGNOSIS — D61818 Other pancytopenia: Secondary | ICD-10-CM | POA: Diagnosis present

## 2016-01-20 DIAGNOSIS — Z7901 Long term (current) use of anticoagulants: Secondary | ICD-10-CM | POA: Diagnosis not present

## 2016-01-20 DIAGNOSIS — E876 Hypokalemia: Secondary | ICD-10-CM | POA: Diagnosis present

## 2016-01-20 DIAGNOSIS — R1084 Generalized abdominal pain: Secondary | ICD-10-CM | POA: Diagnosis not present

## 2016-01-20 DIAGNOSIS — E872 Acidosis: Secondary | ICD-10-CM | POA: Diagnosis present

## 2016-01-20 DIAGNOSIS — K219 Gastro-esophageal reflux disease without esophagitis: Secondary | ICD-10-CM | POA: Diagnosis present

## 2016-01-20 DIAGNOSIS — A0811 Acute gastroenteropathy due to Norwalk agent: Secondary | ICD-10-CM | POA: Diagnosis present

## 2016-01-20 DIAGNOSIS — E860 Dehydration: Secondary | ICD-10-CM | POA: Diagnosis present

## 2016-01-20 LAB — GASTROINTESTINAL PANEL BY PCR, STOOL (REPLACES STOOL CULTURE)
ASTROVIRUS: NOT DETECTED
Adenovirus F40/41: NOT DETECTED
CYCLOSPORA CAYETANENSIS: NOT DETECTED
Campylobacter species: NOT DETECTED
Cryptosporidium: NOT DETECTED
E. COLI O157: NOT DETECTED
ENTEROTOXIGENIC E COLI (ETEC): NOT DETECTED
Entamoeba histolytica: NOT DETECTED
Enteroaggregative E coli (EAEC): NOT DETECTED
Enteropathogenic E coli (EPEC): NOT DETECTED
Giardia lamblia: NOT DETECTED
NOROVIRUS GI/GII: DETECTED — AB
PLESIMONAS SHIGELLOIDES: NOT DETECTED
ROTAVIRUS A: NOT DETECTED
SAPOVIRUS (I, II, IV, AND V): NOT DETECTED
SHIGA LIKE TOXIN PRODUCING E COLI (STEC): NOT DETECTED
Salmonella species: NOT DETECTED
Shigella/Enteroinvasive E coli (EIEC): NOT DETECTED
Vibrio cholerae: NOT DETECTED
Vibrio species: NOT DETECTED
Yersinia enterocolitica: NOT DETECTED

## 2016-01-20 LAB — COMPREHENSIVE METABOLIC PANEL
ALBUMIN: 2.9 g/dL — AB (ref 3.5–5.0)
ALK PHOS: 69 U/L (ref 38–126)
ALT: 33 U/L (ref 14–54)
ALT: 29 U/L (ref 14–54)
AST: 20 U/L (ref 15–41)
AST: 16 U/L (ref 15–41)
Albumin: 2.8 g/dL — ABNORMAL LOW (ref 3.5–5.0)
Alkaline Phosphatase: 62 U/L (ref 38–126)
Anion gap: 14 (ref 5–15)
Anion gap: 12 (ref 5–15)
BILIRUBIN TOTAL: 0.9 mg/dL (ref 0.3–1.2)
BILIRUBIN TOTAL: 0.6 mg/dL (ref 0.3–1.2)
BUN: 12 mg/dL (ref 6–20)
BUN: 9 mg/dL (ref 6–20)
CALCIUM: 7.8 mg/dL — AB (ref 8.9–10.3)
CALCIUM: 7.7 mg/dL — AB (ref 8.9–10.3)
CHLORIDE: 102 mmol/L (ref 101–111)
CO2: 19 mmol/L — AB (ref 22–32)
CO2: 24 mmol/L (ref 22–32)
CREATININE: 0.52 mg/dL (ref 0.44–1.00)
Chloride: 106 mmol/L (ref 101–111)
Creatinine, Ser: 0.58 mg/dL (ref 0.44–1.00)
GFR calc Af Amer: >60 mL/min (ref >60)
GFR calc non Af Amer: >60 mL/min (ref >60)
GLUCOSE: 57 mg/dL — AB (ref 65–99)
Glucose, Bld: 75 mg/dL (ref 65–99)
Potassium: 3.1 mmol/L — ABNORMAL LOW (ref 3.5–5.1)
Potassium: 3.3 mmol/L — ABNORMAL LOW (ref 3.5–5.1)
Sodium: 139 mmol/L (ref 135–145)
Sodium: 138 mmol/L (ref 135–145)
TOTAL PROTEIN: 4.7 g/dL — AB (ref 6.5–8.1)
TOTAL PROTEIN: 5.2 g/dL — AB (ref 6.5–8.1)

## 2016-01-20 LAB — CBC
HEMATOCRIT: 37.5 % (ref 36.0–46.0)
HEMOGLOBIN: 11.8 g/dL — AB (ref 12.0–15.0)
MCH: 28.6 pg (ref 26.0–34.0)
MCHC: 31.5 g/dL (ref 30.0–36.0)
MCV: 90.8 fL (ref 78.0–100.0)
Platelets: 89 10*3/uL — ABNORMAL LOW (ref 150–400)
RBC: 4.13 MIL/uL (ref 3.87–5.11)
RDW: 16.7 % — ABNORMAL HIGH (ref 11.5–15.5)
WBC: 0.5 10*3/uL — AB (ref 4.0–10.5)

## 2016-01-20 LAB — PROCALCITONIN: PROCALCITONIN: 0.16 ng/mL

## 2016-01-20 LAB — LACTIC ACID, PLASMA
LACTIC ACID, VENOUS: 3.1 mmol/L — AB (ref 0.5–2.0)
Lactic Acid, Venous: 2.6 mmol/L (ref 0.5–2.0)
Lactic Acid, Venous: 1.6 mmol/L (ref 0.5–2.0)

## 2016-01-20 LAB — URINE CULTURE: Special Requests: NORMAL

## 2016-01-20 LAB — PROTIME-INR
INR: 1 (ref 0.00–1.49)
Prothrombin Time: 13.4 seconds (ref 11.6–15.2)

## 2016-01-20 LAB — APTT: APTT: 21 s — AB (ref 24–37)

## 2016-01-20 MED ORDER — DEXTROSE 5 % IV SOLN
2.0000 g | Freq: Three times a day (TID) | INTRAVENOUS | Status: DC
  Administered 2016-01-20 – 2016-01-22 (×4): 2 g via INTRAVENOUS
  Filled 2016-01-20 (×8): qty 2

## 2016-01-20 MED ORDER — POTASSIUM CHLORIDE CRYS ER 20 MEQ PO TBCR
40.0000 meq | EXTENDED_RELEASE_TABLET | Freq: Once | ORAL | Status: AC
  Administered 2016-01-20: 40 meq via ORAL
  Filled 2016-01-20: qty 2

## 2016-01-20 MED ORDER — DEXTROSE 5 % IV SOLN
2.0000 g | INTRAVENOUS | Status: AC
  Administered 2016-01-20: 2 g via INTRAVENOUS
  Filled 2016-01-20: qty 2

## 2016-01-20 NOTE — Progress Notes (Addendum)
MD paged and made aware about positive norovirus result from GI panel.

## 2016-01-20 NOTE — Progress Notes (Signed)
CRITICAL VALUE ALERT  Critical value received:  3.1 Lactic Acid  Date of notification:  01/20/2016   Time of notification:  4:34 PM  Critical value read back:Yes.    Nurse who received alert:  Merrily Pew  MD notified (1st page):  4:34 PM   Time of first page:  4:34 PM

## 2016-01-20 NOTE — Progress Notes (Signed)
Dr. Doyle Askew paged at 1055 to make aware of Critical Labs:  WBC 0.5 Lactic Acid 2.6   Lab Contacted RN at 1054.

## 2016-01-20 NOTE — Progress Notes (Signed)
Patient ID: [name], female   DOB: 1955/10/23, 60 y.o.   MRN: [id number]  [facility] PROGRESS NOTE  [name] JGG:[id number] DOB: [birth year] DOA: 01/19/2016 PCP: Alesia Richards, MD   Brief narrative:    60 y.o. female with a history of R breast cancer currently on Adriamycin and Cytoxan, s/p C1 3/29 with Neulasta on 3/30, HTN, HLD, chronic anemia, Atrial Fibrillation on Xarelto, and renal stones presenting with 1 week history of abdominal pain, worse since 1 am, diffuse accompanied by vomiting x 4-5 today, and 1 episode of lose stools. She reports similar symptoms following prior cycle of chemo. The pain is severe, 10 out of 10.   At the ED,BP 140/57 mmHg  Pulse 87  Temp(Src) 97.6 F (36.4 C) (Oral)  Resp 16  SpO2 99% . CTA of the abdomen and pelvis showed no evidence of acute bowel pathology; left nephrolithiasis; known moderate celiac origin stenosis. Received 2 L IVF. Lactic acid improving slowly from 4.61 to 3.6 .  Assessment/Plan:    Abdominal pain with diarrhea and nausea/vomiting  - stool studies notable for norovirus but unclear if other etiologies contributing such as chemo  - for now provide supportive care with IVF, antiemetics and analgesia as needed  - enteric precautions - advance diet   Hypertension, essential - continue home medical regimen   Pancytopenia  - from chemo but also on AC for A-fib - holding Xarelto - will not give neupogen but will monitor for now - no indication for transfusion at this time  - place on Maxipime for now as pt may not be able to mount infectious response   Sepsis  - pt meets criteria for sepsis with WBC < 3, elevated lactic acid, immunocompromised - source norovirus but not sure if other bacterial source contributing - sepsis order set in place  - monitor lactic acid    Hypokalemia - mild, supplement, repeat BMP in AM  Chronic Paroxysmal atrial fibrillation - CHADs2vasc = 2 on Xarelto - hold  AC due to hematuria  Hematuria - hold AC for now and monitor response   DVT prophylaxis - SCD  Code Status: Full.  Family Communication:  plan of care discussed with the patient Disposition Plan: Home when stable.   IV access:  Peripheral IV  Procedures and diagnostic studies:    Dg Chest Port 1 View  12/23/2015  CLINICAL DATA:  Port-A-Cath placed EXAM: PORTABLE CHEST 1 VIEW COMPARISON:  08/03/2015 FINDINGS: New left subclavian Port-A-Cath placed. Tip is at the cavoatrial junction. Normal heart size. Lungs under aerated and clear. No evidence of pneumothorax. IMPRESSION: Left subclavian Port-A-Cath placed with its tip at the cavoatrial junction and no pneumothorax. Electronically Signed   By: Marybelle Killings M.D.   On: 12/23/2015 13:06   Dg C-arm 1-60 Min-no Report  12/23/2015  CLINICAL DATA: surgery C-ARM 1-60 MINUTES Fluoroscopy was utilized by the requesting physician.  No radiographic interpretation.   [place] Rt Breast Bx W Loc Dev 1st Lesion Img Bx Spec US Guide  01/08/2016  ADDENDUM REPORT: 01/08/2016 12:15 ADDENDUM: Pathology revealed POSITIVE FOR DUCTAL CARCINOMA of the Level I Right axillary lymph node. The differential includes complete replacement of a lymph node versus direct extension. This was found to be concordant by Dr. Peggye Fothergill. Pathology results were discussed with the patient by Dr. Nicholas Lose at [facility]. The patient reported tenderness and is doing well after the biopsy. Post biopsy instructions and care were reviewed and questions were  answered. The patient was encouraged to call [facility] for any additional concerns. The patient has a recent diagnosis of Right breast cancer and should follow her outlined treatment plan. Pathology results reported by Terie Purser, RN on 01/08/2016. Electronically Signed   By: Evangeline Dakin M.D.   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 Pre-treatment MRI demonstrated a pathologic 1.9 cm right low axillary level 1 lymph node. Biopsy of this node has been requested. EXAM: ULTRASOUND GUIDED CORE NEEDLE BIOPSY OF A RIGHT AXILLARY NODE COMPARISON:  Previous exam(s). FINDINGS: I met with the patient and we discussed the procedure of ultrasound-guided biopsy, including benefits and alternatives. We discussed the high likelihood of a successful procedure. We discussed the risks of the procedure, including infection, bleeding, tissue injury, clip migration, and inadequate sampling. Informed written consent was given. The usual time-out protocol was performed immediately prior to the procedure. Using sterile technique with chlorhexidine as skin antisepsis, buffered 1% Lidocaine as local anesthetic, under direct ultrasound visualization, a 14 gauge Bard Marquee core needle device was used to perform biopsy of a pathologic low level 1 right axillary lymph node using an inferolateral approach. At the conclusion of the procedure a Hydromark spiral shaped tissue marker clip was deployed into the biopsy cavity. Patient tolerated the procedure well without apparent immediate complications. IMPRESSION: Ultrasound guided biopsy of a pathologic low level 1 right axillary lymph node. No apparent complications. Electronically Signed: By: Evangeline Dakin M.D. On: 01/06/2016 09:23   Ct Cta Abd/pel W/cm &/or W/o Cm  01/19/2016  CLINICAL DATA:  Generalized mid abdominal pain with vomiting for 1 week, worse this morning. Elevated lactate. Currently undergoing chemotherapy for breast cancer. EXAM: CTA ABDOMEN AND PELVIS WITH CONTRAST TECHNIQUE: Multidetector CT imaging of the abdomen and pelvis was performed using the standard protocol during bolus administration of intravenous contrast. Multiplanar reconstructed images and  MIPs were obtained and reviewed to evaluate the vascular anatomy. CONTRAST:  100 mL Isovue 370 COMPARISON:  Noncontrast CT abdomen 10/06/2015. CTA abdomen and pelvis 07/08/2015. FINDINGS: VASCULAR: The abdominal aorta is patent and normal in caliber with very mild non stenotic plaque and no evidence of dissection. The celiac axis, SMA, renal arteries, and IMA are patent. There is moderate-appearing celiac origin stenosis, slightly less prominent than what was demonstrated on the prior CTA although there is mild motion artifact on the current examination and the prior study was of higher quality. There is at most mild proximal right renal artery narrowing by atheromatous plaque. Focal calcified plaque is again seen at the IMA origin. The iliac arteries are patent with mild non stenotic plaque bilaterally. The IVC, iliac veins, SMV, splenic vein, portal vein, and renal veins appear patent, as does the IVC. NONVASCULAR: Subsegmental atelectasis is present in the visualized lung bases. There is no pleural effusion. 1.4 cm hypodense lesion in the left hepatic lobe is unchanged and may represent a cyst. The gallbladder, spleen, adrenal glands, and pancreas are unremarkable. Nonobstructing left lower pole renal calculi are again seen, with evaluation partially limited due to excreted contrast in the renal collecting systems and ureters. Scarring in a small cyst are noted in the left lower pole. The right ureteral stent has been removed. There is a small sliding hiatal hernia. There is no evidence of bowel  obstruction or inflammation. No intraperitoneal free air, free fluid, or enlarged lymph nodes are identified. The uterus, ovaries, and bladder are unremarkable. A 3.2 cm right breast mass is partially visualized corresponding to the known malignancy. Calcified granulomas are noted in the gluteal regions bilaterally. There is grade 1 anterolisthesis of L4 on L5 due to advanced facet arthrosis. No suspicious lytic or  blastic osseous lesion is identified. Review of the MIP images confirms the above findings. IMPRESSION: 1. 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 Patent SMA and IMA. 2. No evidence of acute bowel pathology by CT. 3. Left nephrolithiasis. Electronically Signed   By: Logan Bores M.D.   On: 01/19/2016 11:16    Medical Consultants:  None  Other Consultants:  None  IAnti-Infectives:   Maxipime 4/4 -->  Faye Ramsay, MD  [facility] Pager [phone]  If 7PM-7AM, please contact night-coverage [email or web address] Password [email or web address] 01/20/2016, 6:42 PM   LOS: 0 days   HPI/Subjective: No events overnight.   Objective: Filed Vitals:   01/19/16 1659 01/19/16 2207 01/20/16 0603 01/20/16 1400  BP: 104/58 107/49 133/54   Pulse: 72 70 63   Temp: 98.8 F (37.1 C) 98.5 F (36.9 C) 98.2 F (36.8 C)   TempSrc: Oral Oral Oral   Resp:  18 18   Height:    _0  (1.575 m)  Weight:   84.7 kg (186 lb 11.7 oz)   SpO2: 100% 100% 98%     Intake/Output Summary (Last 24 hours) at 01/20/16 1842 Last data filed at 01/20/16 1825  Gross per 24 hour  Intake 2591.67 ml  Output      0 ml  Net 2591.67 ml    Exam:   General:  Pt is alert, follows commands appropriately, not in acute distress  Cardiovascular: Irregular rate and rhythm, no rubs, no gallops  Respiratory: Clear to auscultation bilaterally, no wheezing, no crackles, no rhonchi  Abdomen: Soft, non tender, non distended, bowel sounds present, no guarding   Data Reviewed: Basic Metabolic Panel:  Recent Labs Lab 01/14/16 1034 01/19/16 0730 01/20/16 0909 01/20/16 1453  NA 138 140 139 138  K 4.3 4.1 3.1* 3.3*  CL  --  100* 106 102  CO2 25 25 19* 24  GLUCOSE 93 116* 57* 75  BUN 14._1 CREATININE 0.7 0.65 0.58 0.52  CALCIUM 8.9 8.7* 7.8* 7.7*   Liver Function Tests:  Recent Labs Lab 01/14/16 1034 01/19/16 0730 01/20/16 0909 01/20/16 1453  AST _2 ALT 36 34 33 29  ALKPHOS 65 87 69 62   BILITOT <0.30 0.5 0.9 0.6  PROT 6.2* 5.6* 4.7* 5.2*  ALBUMIN 3.1* 3.1* 2.9* 2.8*    Recent Labs Lab 01/19/16 0730  LIPASE 20   CBC:  Recent Labs Lab 01/14/16 1034 01/19/16 0730 01/20/16 0909  WBC 14.9* 12.3* 0.5*  NEUTROABS 13.5*  --   --   HGB 12.3 12.7 11.8*  HCT 37.8 38.2 37.5  MCV 89.2 90.3 90.8  PLT 193 128* 89*   CBG:  Recent Labs Lab 01/19/16 0650  GLUCAP 100*    Recent Results (from the past 240 hour(s))  Culture, blood (routine x 2)     Status: None (Preliminary result)   Collection Time: 01/19/16  7:24 AM  Result Value Ref Range Status   Specimen Description BLOOD RIGHT HAND  Final   Special Requests IN PEDIATRIC BOTTLE 1CC  Final   Culture NO GROWTH 1 DAY  Final   Report Status PENDING  Incomplete  Culture, blood (routine x 2)     Status: None (Preliminary result)   Collection Time: 01/19/16  8:03 AM  Result Value Ref Range Status   Specimen Description BLOOD RIGHT FOREARM  Final   Special Requests IN PEDIATRIC BOTTLE [place]  Final   Culture NO GROWTH 1 DAY  Final   Report Status PENDING  Incomplete  Urine culture     Status: None   Collection Time: 01/19/16 10:33 AM  Result Value Ref Range Status   Specimen Description URINE, CLEAN CATCH  Final   Special Requests Normal  Final   Culture MULTIPLE SPECIES PRESENT, SUGGEST RECOLLECTION  Final   Report Status 01/20/2016 FINAL  Final  C difficile quick scan w PCR reflex     Status: None   Collection Time: 01/19/16  4:57 PM  Result Value Ref Range Status   C Diff antigen NEGATIVE NEGATIVE Final   C Diff toxin NEGATIVE NEGATIVE Final   C Diff interpretation Negative for toxigenic C. difficile  Final  Gastrointestinal Panel by PCR , Stool     Status: Abnormal   Collection Time: 01/19/16  4:57 PM  Result Value Ref Range Status   Campylobacter species NOT DETECTED NOT DETECTED Final   Plesimonas shigelloides NOT DETECTED NOT DETECTED Final   Salmonella species NOT DETECTED NOT DETECTED Final    Yersinia enterocolitica NOT DETECTED NOT DETECTED Final   Vibrio species NOT DETECTED NOT DETECTED Final   Vibrio cholerae NOT DETECTED NOT DETECTED Final   Enteroaggregative E coli (EAEC) NOT DETECTED NOT DETECTED Final   Enteropathogenic E coli (EPEC) NOT DETECTED NOT DETECTED Final   Enterotoxigenic E coli (ETEC) NOT DETECTED NOT DETECTED Final   Shiga like toxin producing E coli (STEC) NOT DETECTED NOT DETECTED Final   E. coli O157 NOT DETECTED NOT DETECTED Final   Shigella/Enteroinvasive E coli (EIEC) NOT DETECTED NOT DETECTED Final   Cryptosporidium NOT DETECTED NOT DETECTED Final   Cyclospora cayetanensis NOT DETECTED NOT DETECTED Final   Entamoeba histolytica NOT DETECTED NOT DETECTED Final   Giardia lamblia NOT DETECTED NOT DETECTED Final   Adenovirus F40/41 NOT DETECTED NOT DETECTED Final   Astrovirus NOT DETECTED NOT DETECTED Final   Norovirus GI/GII DETECTED (A) NOT DETECTED Final    Comment: CRITICAL RESULT CALLED TO, READ BACK BY AND VERIFIED WITH: SHATARA POWELL AT [facility] ON 01/20/16 AT 1440. CTJ    Rotavirus A NOT DETECTED NOT DETECTED Final   Sapovirus (I, II, IV, and V) NOT DETECTED NOT DETECTED Final     Scheduled Meds: . atenolol  100 mg Oral Daily  . ceFEPime (MAXIPIME) IV  2 g Intravenous 3 times per day  . diazepam  5 mg Oral Daily  . diltiazem  120 mg Oral Daily   Continuous Infusions: . sodium chloride 100 mL/hr at 01/20/16 1112

## 2016-01-20 NOTE — Progress Notes (Signed)
Patient having emesis. Patient request clear liquid diet.

## 2016-01-20 NOTE — Progress Notes (Signed)
ANTIBIOTIC CONSULT NOTE - INITIAL  Pharmacy Consult for Cefepime Indication: sepsis, Febrile Neutropenia   Allergies  Allergen Reactions  . Penicillins Other (See Comments)    Unknown reaction as a child. Has patient had a PCN reaction causing immediate rash, facial/tongue/throat swelling, SOB or lightheadedness with hypotension: No Has patient had a PCN reaction causing severe rash involving mucus membranes or skin necrosis: No Has patient had a PCN reaction that required hospitalization No Has patient had a PCN reaction occurring within the last 10 years: No If all of the above answers are "NO", then may proceed with Cephalosporin use.     Patient Measurements: Height: 5\' 2"  (157.5 cm) (taken from 01/01/16) Weight: 186 lb 11.7 oz (84.7 kg) IBW/kg (Calculated) : 50.1   Vital Signs: Temp: 98.2 F (36.8 C) (04/04 0603) Temp Source: Oral (04/04 0603) BP: 133/54 mmHg (04/04 0603) Pulse Rate: 63 (04/04 0603) Intake/Output from previous day: 04/03 0701 - 04/04 0700 In: 256.7 [I.V.:256.7] Out: -  Intake/Output from this shift: Total I/O In: 635 [P.O.:240; I.V.:395] Out: -   Labs:  Recent Labs  01/19/16 0730 01/20/16 0909  WBC 12.3* 0.5*  HGB 12.7 11.8*  PLT 128* 89*  CREATININE 0.65 0.58   Estimated Creatinine Clearance: 75.4 mL/min (by C-G formula based on Cr of 0.58). No results for input(s): VANCOTROUGH, VANCOPEAK, VANCORANDOM, GENTTROUGH, GENTPEAK, GENTRANDOM, TOBRATROUGH, TOBRAPEAK, TOBRARND, AMIKACINPEAK, AMIKACINTROU, AMIKACIN in the last 72 hours.   Microbiology: Recent Results (from the past 720 hour(s))  Culture, blood (routine x 2)     Status: None (Preliminary result)   Collection Time: 01/19/16  7:24 AM  Result Value Ref Range Status   Specimen Description BLOOD RIGHT HAND  Final   Special Requests IN PEDIATRIC BOTTLE [place]  Final   Culture NO GROWTH 1 DAY  Final   Report Status PENDING  Incomplete  Culture, blood (routine x 2)     Status: None  (Preliminary result)   Collection Time: 01/19/16  8:03 AM  Result Value Ref Range Status   Specimen Description BLOOD RIGHT FOREARM  Final   Special Requests IN PEDIATRIC BOTTLE 1CC  Final   Culture NO GROWTH 1 DAY  Final   Report Status PENDING  Incomplete  Urine culture     Status: None   Collection Time: 01/19/16 10:33 AM  Result Value Ref Range Status   Specimen Description URINE, CLEAN CATCH  Final   Special Requests Normal  Final   Culture MULTIPLE SPECIES PRESENT, SUGGEST RECOLLECTION  Final   Report Status 01/20/2016 FINAL  Final  C difficile quick scan w PCR reflex     Status: None   Collection Time: 01/19/16  4:57 PM  Result Value Ref Range Status   C Diff antigen NEGATIVE NEGATIVE Final   C Diff toxin NEGATIVE NEGATIVE Final   C Diff interpretation Negative for toxigenic C. difficile  Final    Medical History: Past Medical History  Diagnosis Date  . MRSA (methicillin resistant staph aureus) culture positive 2009     none since per patient  . Eczema   . Hypercholesteremia   . Hypertension   . Kidney stones   . Anemia   . Vitamin D deficiency   . Paroxysmal atrial fibrillation ([place]) 10/2011, 06/2015  . GERD (gastroesophageal reflux disease)   . Atrial fibrillation ([place]) 2016  . Dysrhythmia     A FIB - followed by Dr. Meda Coffee  . Breast cancer of upper-outer quadrant of right female breast ([place]) 12/12/2015  .  Breast cancer ([place])   . Complication of anesthesia 10-06-15     slow to awaken after   . PONV (postoperative nausea and vomiting) 10-06-15    admitted back to hospital for dehydration    Medications:  Scheduled:  . atenolol  100 mg Oral Daily  . ceFEPime (MAXIPIME) IV  2 g Intravenous STAT   Followed by  . ceFEPime (MAXIPIME) IV  2 g Intravenous 3 times per day  . diazepam  5 mg Oral Daily  . diltiazem  120 mg Oral Daily  . potassium chloride  40 mEq Oral Once   Assessment: 60 y.o female with h/o breast cancer and recently received chemotherapy. She  presented to the hospital on 01/19/16  with complaints of vomiting, diarrhea and dehydration.  On admission WBC = 12.3K.  Today WBC is down to 0.5K  Patient is afebrile with Tmax 98.5 SCr =0.65> 0.58, estimated CrCl is ~ 75 ml/min.   Goal of Therapy:  Treatment of infection at dose appropriate for patient's renal infunction  Plan:  Cefepime 2 gm IV STAT then 2gm IV q8h  Monitor clinical status, renal function and culture results daily.   Nicole Cella, RPh Clinical Pharmacist Pager: [phone] 01/20/2016,2:43 PM

## 2016-01-20 NOTE — Progress Notes (Signed)
OT Cancellation Note  Patient Details Name: [name] MRN: [id number] DOB: 1956-01-20   Cancelled Treatment:    Reason Eval/Treat Not Completed: OT screened, no needs identified, will sign off.  Pt and pt's daughter report no concerns with mobility or ADLs.  [name] 01/20/2016, 3:43 PM

## 2016-01-20 NOTE — Progress Notes (Signed)
PT Cancellation Note  Patient Details Name: [name] MRN: [id number] DOB: [birth year]   Cancelled Treatment:    Reason Eval/Treat Not Completed: PT screened, no needs identified, will sign off. Pt mobilizing independently without difficulty.   Cerinity Zynda 01/20/2016, 1:17 PM [place]

## 2016-01-21 ENCOUNTER — Ambulatory Visit (HOSPITAL_COMMUNITY): Payer: BC Managed Care – PPO

## 2016-01-21 LAB — BASIC METABOLIC PANEL
ANION GAP: 9 (ref 5–15)
BUN: <5 mg/dL — ABNORMAL LOW (ref 6–20)
CALCIUM: 8 mg/dL — AB (ref 8.9–10.3)
CO2: 27 mmol/L (ref 22–32)
Chloride: 103 mmol/L (ref 101–111)
Creatinine, Ser: 0.47 mg/dL (ref 0.44–1.00)
GFR calc Af Amer: >60 mL/min (ref >60)
Glucose, Bld: 77 mg/dL (ref 65–99)
POTASSIUM: 3.3 mmol/L — AB (ref 3.5–5.1)
SODIUM: 139 mmol/L (ref 135–145)

## 2016-01-21 LAB — CBC WITH DIFFERENTIAL/PLATELET
BASOS ABS: 0 10*3/uL (ref 0.0–0.1)
Basophils Relative: 0 %
EOS PCT: 3 %
Eosinophils Absolute: 0 10*3/uL (ref 0.0–0.7)
HCT: 31.8 % — ABNORMAL LOW (ref 36.0–46.0)
Hemoglobin: 10.5 g/dL — ABNORMAL LOW (ref 12.0–15.0)
Lymphocytes Relative: 46 %
Lymphs Abs: 0.2 10*3/uL — ABNORMAL LOW (ref 0.7–4.0)
MCH: 29.6 pg (ref 26.0–34.0)
MCHC: 33 g/dL (ref 30.0–36.0)
MCV: 89.6 fL (ref 78.0–100.0)
MONO ABS: 0.1 10*3/uL (ref 0.1–1.0)
Monocytes Relative: 19 %
NEUTROS PCT: 32 %
Neutro Abs: 0.1 10*3/uL — ABNORMAL LOW (ref 1.7–7.7)
PLATELETS: 74 10*3/uL — AB (ref 150–400)
RBC: 3.55 MIL/uL — AB (ref 3.87–5.11)
RDW: 16 % — AB (ref 11.5–15.5)
WBC: 0.4 10*3/uL — AB (ref 4.0–10.5)

## 2016-01-21 MED ORDER — POTASSIUM CHLORIDE CRYS ER 20 MEQ PO TBCR
40.0000 meq | EXTENDED_RELEASE_TABLET | Freq: Once | ORAL | Status: AC
  Administered 2016-01-21: 40 meq via ORAL
  Filled 2016-01-21: qty 2

## 2016-01-21 NOTE — Progress Notes (Signed)
Patient ID: [name], female   DOB: 1956-07-13, 60 y.o.   MRN: [id number]  [facility] PROGRESS NOTE  [name] [id number] DOB: March 25, 1956 DOA: 01/19/2016 PCP: Alesia Richards, MD   Brief narrative:    60 y.o. female with a history of R breast cancer currently on Adriamycin and Cytoxan, s/p C1 3/29 with Neulasta on 3/30, HTN, HLD, chronic anemia, Atrial Fibrillation on Xarelto, and renal stones presenting with 1 week history of abdominal pain, worse since 1 am, diffuse accompanied by vomiting x 4-5 today, and 1 episode of lose stools. She reports similar symptoms following prior cycle of chemo. The pain is severe, 10 out of 10.   At the ED,BP 140/57 mmHg  Pulse 87  Temp(Src) 97.6 F (36.4 C) (Oral)  Resp 16  SpO2 99% . CTA of the abdomen and pelvis showed no evidence of acute bowel pathology; left nephrolithiasis; known moderate celiac origin stenosis. Received 2 L IVF. Lactic acid improving slowly from 4.61 to 3.6 .  Assessment/Plan:    Abdominal pain with diarrhea and nausea/vomiting  - stool studies notable for norovirus but unclear if other etiologies contributing such as chemo  - continue to provide supportive care with IVF, antiemetics and analgesia as needed  - enteric precautions - advance diet as pt able to tolerate  - CBC and BMP in AM  Hypertension, essential - continue home medical regimen   Pancytopenia  - from chemo but suspect Hg down due to [facility] for A-fib - holding Xarelto - will not give neupogen but will monitor for now - no indication for transfusion at this time  - continue Maxipime day #2  Sepsis  - pt meets criteria for sepsis with WBC < 3, elevated lactic acid, immunocompromised - source norovirus but not sure if other bacterial source contributing - sepsis order set in place, will continue maxipime for now until more data is back - monitor lactic acid, currently WNL  Hypokalemia - mild, continue to supplement, repeat  BMP in AM  Chronic Paroxysmal atrial fibrillation - CHADs2vasc = 2 on Xarelto - hold AC due to hematuria  Hematuria - hold AC for now and monitor response   DVT prophylaxis - SCD  Code Status: Full.  Family Communication:  plan of care discussed with the patient Disposition Plan: Home by 4/7  IV access:  Peripheral IV  Procedures and diagnostic studies:    Dg Chest Port 1 View 01-14-16 Left subclavian Port-A-Cath placed with its tip at the cavoatrial junction and no pneumothorax.   Dg C-arm 1-60 Min-no Report  01/14/16  CLINICAL DATA: surgery C-ARM 1-60 MINUTES Fluoroscopy was utilized by the requesting physician.  No radiographic interpretation.   [place] Rt Breast Bx W Loc Dev 1st Lesion Img Bx Spec US Guide 01/08/2016  Ultrasound guided biopsy of a pathologic low level 1 right axillary lymph node.   Ct Cta Abd/pel W/cm &/or W/o Cm 01/19/2016 At least moderate celiac origin stenosis as seen on the prior CTA. Patent SMA and IMA. 2. No evidence of acute bowel pathology by CT. 3. Left nephrolithiasis.   Medical Consultants:  None  Other Consultants:  None  IAnti-Infectives:   Maxipime 4/4 -->  Faye Ramsay, MD  [facility] Pager [phone]  If 7PM-7AM, please contact night-coverage [email or web address] Password [facility] 01/21/2016, 6:34 PM   LOS: 1 day   HPI/Subjective: No events overnight.   Objective: Filed Vitals:   01/20/16 1400 01/20/16 2208 01/21/16 0559 01/21/16 1341  BP:  140/64 102/53 139/61  Pulse:  65 70 72  Temp:  98.5 F (36.9 C) 98.3 F (36.8 C) 99.6 F (37.6 C)  TempSrc:    Oral  Resp:  18 16 20   Height: 5\' 2"  (1.575 m)     Weight:   85.2 kg (187 lb 13.3 oz)   SpO2:  98% 99% 99%    Intake/Output Summary (Last 24 hours) at 01/21/16 1834 Last data filed at 01/21/16 1300  Gross per 24 hour  Intake    238 ml  Output      0 ml  Net    238 ml    Exam:   General:  Pt is alert, follows commands appropriately, not in acute distress  Cardiovascular:  Irregular rate and rhythm, no rubs, no gallops  Respiratory: Clear to auscultation bilaterally, no wheezing, no crackles, no rhonchi  Abdomen: Soft, non tender, non distended, bowel sounds present, no guarding   Data Reviewed: Basic Metabolic Panel:  Recent Labs Lab 01/19/16 0730 01/20/16 0909 01/20/16 1453 01/21/16 0631  NA 140 139 138 139  K 4.1 3.1* 3.3* 3.3*  CL 100* 106 102 103  CO2 25 19* 24 27  GLUCOSE 116* 57* 75 77  BUN 20 12 9  <5*  CREATININE 0.65 0.58 0.52 0.47  CALCIUM 8.7* 7.8* 7.7* 8.0*   Liver Function Tests:  Recent Labs Lab 01/19/16 0730 01/20/16 0909 01/20/16 1453  AST 26 20 16   ALT 34 33 29  ALKPHOS 87 69 62  BILITOT 0.5 0.9 0.6  PROT 5.6* 4.7* 5.2*  ALBUMIN 3.1* 2.9* 2.8*    Recent Labs Lab 01/19/16 0730  LIPASE 20   CBC:  Recent Labs Lab 01/19/16 0730 01/20/16 0909 01/21/16 0631  WBC 12.3* 0.5* 0.4*  NEUTROABS  --   --  0.1*  HGB 12.7 11.8* 10.5*  HCT 38.2 37.5 31.8*  MCV 90.3 90.8 89.6  PLT 128* 89* 74*   CBG:  Recent Labs Lab 01/19/16 0650  GLUCAP 100*    Recent Results (from the past 240 hour(s))  Culture, blood (routine x 2)     Status: None (Preliminary result)   Collection Time: 01/19/16  7:24 AM  Result Value Ref Range Status   Specimen Description BLOOD RIGHT HAND  Final   Special Requests IN PEDIATRIC BOTTLE 1CC  Final   Culture NO GROWTH 2 DAYS  Final   Report Status PENDING  Incomplete  Culture, blood (routine x 2)     Status: None (Preliminary result)   Collection Time: 01/19/16  8:03 AM  Result Value Ref Range Status   Specimen Description BLOOD RIGHT FOREARM  Final   Special Requests IN PEDIATRIC BOTTLE 1CC  Final   Culture NO GROWTH 2 DAYS  Final   Report Status PENDING  Incomplete  Urine culture     Status: None   Collection Time: 01/19/16 10:33 AM  Result Value Ref Range Status   Specimen Description URINE, CLEAN CATCH  Final   Special Requests Normal  Final   Culture MULTIPLE SPECIES PRESENT,  SUGGEST RECOLLECTION  Final   Report Status 01/20/2016 FINAL  Final  C difficile quick scan w PCR reflex     Status: None   Collection Time: 01/19/16  4:57 PM  Result Value Ref Range Status   C Diff antigen NEGATIVE NEGATIVE Final   C Diff toxin NEGATIVE NEGATIVE Final   C Diff interpretation Negative for toxigenic C. difficile  Final  Gastrointestinal Panel by PCR , Stool     Status: Abnormal  Collection Time: 01/19/16  4:57 PM  Result Value Ref Range Status   Campylobacter species NOT DETECTED NOT DETECTED Final   Plesimonas shigelloides NOT DETECTED NOT DETECTED Final   Salmonella species NOT DETECTED NOT DETECTED Final   Yersinia enterocolitica NOT DETECTED NOT DETECTED Final   Vibrio species NOT DETECTED NOT DETECTED Final   Vibrio cholerae NOT DETECTED NOT DETECTED Final   Enteroaggregative E coli (EAEC) NOT DETECTED NOT DETECTED Final   Enteropathogenic E coli (EPEC) NOT DETECTED NOT DETECTED Final   Enterotoxigenic E coli (ETEC) NOT DETECTED NOT DETECTED Final   Shiga like toxin producing E coli (STEC) NOT DETECTED NOT DETECTED Final   E. coli O157 NOT DETECTED NOT DETECTED Final   Shigella/Enteroinvasive E coli (EIEC) NOT DETECTED NOT DETECTED Final   Cryptosporidium NOT DETECTED NOT DETECTED Final   Cyclospora cayetanensis NOT DETECTED NOT DETECTED Final   Entamoeba histolytica NOT DETECTED NOT DETECTED Final   Giardia lamblia NOT DETECTED NOT DETECTED Final   Adenovirus F40/41 NOT DETECTED NOT DETECTED Final   Astrovirus NOT DETECTED NOT DETECTED Final   Norovirus GI/GII DETECTED (A) NOT DETECTED Final    Comment: CRITICAL RESULT CALLED TO, READ BACK BY AND VERIFIED WITH: SHATARA POWELL AT [facility] ON 01/20/16 AT 1440. CTJ    Rotavirus A NOT DETECTED NOT DETECTED Final   Sapovirus (I, II, IV, and V) NOT DETECTED NOT DETECTED Final  Culture, blood (x 2)     Status: None (Preliminary result)   Collection Time: 01/20/16  2:53 PM  Result Value Ref Range Status   Specimen  Description BLOOD RIGHT HAND  Final   Special Requests IN PEDIATRIC BOTTLE 2CC  Final   Culture NO GROWTH < 24 HOURS  Final   Report Status PENDING  Incomplete  Culture, blood (x 2)     Status: None (Preliminary result)   Collection Time: 01/20/16  3:09 PM  Result Value Ref Range Status   Specimen Description BLOOD LEFT HAND  Final   Special Requests IN PEDIATRIC BOTTLE  1CC  Final   Culture NO GROWTH < 24 HOURS  Final   Report Status PENDING  Incomplete     Scheduled Meds: . atenolol  100 mg Oral Daily  . ceFEPime (MAXIPIME) IV  2 g Intravenous 3 times per day  . diazepam  5 mg Oral Daily  . diltiazem  120 mg Oral Daily   Continuous Infusions: . sodium chloride 100 mL/hr at 01/21/16 0915

## 2016-01-22 ENCOUNTER — Other Ambulatory Visit: Payer: BC Managed Care – PPO

## 2016-01-22 ENCOUNTER — Encounter: Payer: BC Managed Care – PPO | Admitting: Genetic Counselor

## 2016-01-22 DIAGNOSIS — R1084 Generalized abdominal pain: Secondary | ICD-10-CM

## 2016-01-22 LAB — BASIC METABOLIC PANEL
ANION GAP: 11 (ref 5–15)
CO2: 26 mmol/L (ref 22–32)
Calcium: 8.2 mg/dL — ABNORMAL LOW (ref 8.9–10.3)
Chloride: 101 mmol/L (ref 101–111)
Creatinine, Ser: 0.46 mg/dL (ref 0.44–1.00)
GFR calc Af Amer: >60 mL/min (ref >60)
GFR calc non Af Amer: >60 mL/min (ref >60)
GLUCOSE: 76 mg/dL (ref 65–99)
POTASSIUM: 3.3 mmol/L — AB (ref 3.5–5.1)
Sodium: 138 mmol/L (ref 135–145)

## 2016-01-22 LAB — CBC WITH DIFFERENTIAL/PLATELET
Basophils Absolute: 0 10*3/uL (ref 0.0–0.1)
Basophils Relative: 0 %
Eosinophils Absolute: 0 10*3/uL (ref 0.0–0.7)
Eosinophils Relative: 1 %
HEMATOCRIT: 32.9 % — AB (ref 36.0–46.0)
HEMOGLOBIN: 10.6 g/dL — AB (ref 12.0–15.0)
LYMPHS PCT: 16 %
Lymphs Abs: 0.2 10*3/uL — ABNORMAL LOW (ref 0.7–4.0)
MCH: 28.4 pg (ref 26.0–34.0)
MCHC: 32.2 g/dL (ref 30.0–36.0)
MCV: 88.2 fL (ref 78.0–100.0)
MONOS PCT: 13 %
Monocytes Absolute: 0.2 10*3/uL (ref 0.1–1.0)
NEUTROS ABS: 1.1 10*3/uL — AB (ref 1.7–7.7)
Neutrophils Relative %: 70 %
Platelets: 78 10*3/uL — ABNORMAL LOW (ref 150–400)
RBC: 3.73 MIL/uL — ABNORMAL LOW (ref 3.87–5.11)
RDW: 15.8 % — ABNORMAL HIGH (ref 11.5–15.5)
WBC Morphology: INCREASED
WBC: 1.5 10*3/uL — ABNORMAL LOW (ref 4.0–10.5)

## 2016-01-22 LAB — PATHOLOGIST SMEAR REVIEW

## 2016-01-22 MED ORDER — POTASSIUM CHLORIDE CRYS ER 20 MEQ PO TBCR
40.0000 meq | EXTENDED_RELEASE_TABLET | Freq: Once | ORAL | Status: AC
  Administered 2016-01-22: 40 meq via ORAL
  Filled 2016-01-22: qty 2

## 2016-01-22 MED ORDER — HYDROCODONE-ACETAMINOPHEN 5-325 MG PO TABS: 1.0000 | ORAL_TABLET | ORAL | Status: DC | PRN

## 2016-01-22 MED ORDER — RIVAROXABAN 20 MG PO TABS: 20.0000 mg | ORAL_TABLET | Freq: Every day | ORAL | Status: AC

## 2016-01-22 NOTE — Discharge Instructions (Signed)
Norovirus Infection °A norovirus infection is caused by exposure to a virus in a group of similar viruses (noroviruses). This type of infection causes inflammation in your stomach and intestines (gastroenteritis). Norovirus is the most common cause of gastroenteritis. It also causes food poisoning. °Anyone can get a norovirus infection. It spreads very easily (contagious). You can get it from contaminated food, water, surfaces, or other people. Norovirus is found in the stool or vomit of infected people. You can spread the infection as soon as you feel sick until 2 weeks after you recover.  °Symptoms usually begin within 2 days after you become infected. Most norovirus symptoms affect the digestive system. °CAUSES °Norovirus infection is caused by contact with norovirus. You can catch norovirus if you: °· Eat or drink something contaminated with norovirus. °· Touch surfaces or objects contaminated with norovirus and then put your hand in your mouth. °· Have direct contact with an infected person who has symptoms. °· Share food, drink, or utensils with someone with who is sick with norovirus. °SIGNS AND SYMPTOMS °Symptoms of norovirus may include: °· Nausea. °· Vomiting. °· Diarrhea. °· Stomach cramps. °· Fever. °· Chills. °· Headache. °· Muscle aches. °· Tiredness. °DIAGNOSIS °Your health care provider may suspect norovirus based on your symptoms and physical exam. Your health care provider may also test a sample of your stool or vomit for the virus.  °TREATMENT °There is no specific treatment for norovirus. Most people get better without treatment in about 2 days. °HOME CARE INSTRUCTIONS °· Replace lost fluids by drinking plenty of water or rehydration fluids containing important minerals called electrolytes. This prevents dehydration. Drink enough fluid to keep your urine clear or pale yellow. °· Do not prepare food for others while you are infected. Wait at least 3 days after recovering from the illness to do  that. °PREVENTION  °· Wash your hands often, especially after using the toilet or changing a diaper. °· Wash fruits and vegetables thoroughly before preparing or serving them. °· Throw out any food that a sick person may have touched. °· Disinfect contaminated surfaces immediately after someone in the household has been sick. Use a bleach-based household cleaner. °· Immediately remove and wash soiled clothes or sheets. °SEEK MEDICAL CARE IF: °· Your vomiting, diarrhea, and stomach pain is getting worse. °· Your symptoms of norovirus do not go away after 2-3 days. °SEEK IMMEDIATE MEDICAL CARE IF:  °You develop symptoms of dehydration that do not improve with fluid replacement. This may include: °· Excessive sleepiness. °· Lack of tears. °· Dry mouth. °· Dizziness when standing. °· Weak pulse. °  °This information is not intended to replace advice given to you by your health care provider. Make sure you discuss any questions you have with your health care provider. °  °Document Released: 12/25/2002 Document Revised: 10/25/2014 Document Reviewed: 03/14/2014 °Elsevier Interactive Patient Education ©2016 [employer] ° °

## 2016-01-22 NOTE — Discharge Summary (Signed)
Physician Discharge Summary  [name] [id number] DOB: Jun 05, 1956 DOA: 01/19/2016  PCP: Alesia Richards, MD  Admit date: 01/19/2016 Discharge date: 01/22/2016  Recommendations for Outpatient Follow-up:  1. Pt will need to follow up with PCP in 2-3 weeks post discharge 2. Please obtain BMP to evaluate electrolytes and kidney function 3. Please also check CBC to evaluate Hg and Hct levels, Plt and WBC counts 4. Please note that pt's WBC, and plt improved while in hospital and I spoke with Dr. Lindi Adie, pt's oncologist who gave [facility] to discharge pt home as pt requested  5. Pt advised to start taking Xarelto on 01/23/2016, her hematuria has resolved   Discharge Diagnoses:  Active Problems:   Mixed hyperlipidemia   Kidney stones   Essential hypertension   Abdominal pain   Dehydration   Vomiting and diarrhea   Atrial fibrillation (HCC)   Hematuria   Discharge Condition: Stable  Diet recommendation: Heart healthy diet discussed in details   Brief narrative:    60 y.o. female with a history of R breast cancer currently on Adriamycin and Cytoxan, s/p C1 3/29 with Neulasta on 3/30, HTN, HLD, chronic anemia, Atrial Fibrillation on Xarelto, and renal stones presenting with 1 week history of abdominal pain, worse since 1 am, diffuse accompanied by vomiting x 4-5 today, and 1 episode of lose stools. She reports similar symptoms following prior cycle of chemo. The pain is severe, 10 out of 10.   At the ED,BP 140/57 mmHg  Pulse 87  Temp(Src) 97.6 F (36.4 C) (Oral)  Resp 16  SpO2 99% . CTA of the abdomen and pelvis showed no evidence of acute bowel pathology; left nephrolithiasis; known moderate celiac origin stenosis. Received 2 L IVF. Lactic acid improving slowly from 4.61 to 3.6 .  Assessment/Plan:    Abdominal pain with diarrhea and nausea/vomiting  - stool studies notable for norovirus - provided supportive care wit IVF, antiemetics and analgesia and pt responded  well  - advanced diet and pt did really well, tolerated diet, ambulated in hallway and requested to go home - spoke with Dr. Lindi Adie (oncologist) who gave [facility] for pt to go home with follow up  Hypertension, essential - continue home medical regimen   Pancytopenia  - from chemo but suspect Hg down due to Xarelto that pt takes for A-fib - holding Xarelto but since no hematuria for 48 hours, advised to start in AM and closely monitor  - no indication for transfusion at this time  - per Dr. Lindi Adie, OK to stop Maxipime as no bacterial source identified   Sepsis  - pt met criteria for sepsis with WBC < 3, elevated lactic acid, immunocompromised - source norovirus - Maxipime discontinued as no bacterial source identified  - sepsis etiology resolved   Hypokalemia - supplemented   Chronic Paroxysmal atrial fibrillation - CHADs2vasc = 2 on Xarelto - resume Xarelto in AM  Hematuria - advised to resume AC as noted above   Code Status: Full.  Family Communication: plan of care discussed with the patient Disposition Plan: Home   IV access:  Peripheral IV  Procedures and diagnostic studies:   Dg Chest Port 1 View 12/23/2015 Left subclavian Port-A-Cath placed with its tip at the cavoatrial junction and no pneumothorax.   Dg C-arm 1-60 Min-no Report  12/23/2015 CLINICAL DATA: surgery C-ARM 1-60 MINUTES Fluoroscopy was utilized by the requesting physician. No radiographic interpretation.   [place] Rt Breast Bx W Loc Dev 1st Lesion Img Bx Spec US  Guide 01/08/2016 Ultrasound guided biopsy of a pathologic low level 1 right axillary lymph node.   Ct Cta Abd/pel W/cm &/or W/o Cm 01/19/2016 At least moderate celiac origin stenosis as seen on the prior CTA. Patent SMA and IMA. 2. No evidence of acute bowel pathology by CT. 3. Left nephrolithiasis.   Medical Consultants:  None  Other Consultants:  None  IAnti-Infectives:   Maxipime 4/4 --> sopped 4/6  Faye Ramsay,  MD [facility] Pager [phone] Cell [phone]       Discharge Exam: Filed Vitals:   01/22/16 0606 01/22/16 0937  BP: 131/59 123/58  Pulse: 81 102  Temp: 98.7 F (37.1 C)   Resp: 18    Filed Vitals:   01/21/16 2226 01/22/16 0529 01/22/16 0606 01/22/16 0937  BP: 117/59 137/64 131/59 123/58  Pulse: 70 87 81 102  Temp: 98.2 F (36.8 C) 98.2 F (36.8 C) 98.7 F (37.1 C)   TempSrc: Oral Oral Oral   Resp: '18 18 18   '$ Height:      Weight:  82.283 kg (181 lb 6.4 oz)    SpO2: 99% 98% 100%     General: Pt is alert, follows commands appropriately, not in acute distress Cardiovascular: Regular rate and rhythm, S1/S2 +, no murmurs, no rubs, no gallops Respiratory: Clear to auscultation bilaterally, no wheezing, no crackles, no rhonchi Abdominal: Soft, non tender, non distended, bowel sounds +, no guarding Extremities: no edema, no cyanosis, pulses palpable bilaterally DP and PT Neuro: Grossly nonfocal  Discharge Instructions  Discharge Instructions    Diet - low sodium heart healthy    Complete by:  As directed      Increase activity slowly    Complete by:  As directed             Medication List    TAKE these medications        atenolol 100 MG tablet  Commonly known as:  TENORMIN  Take 100 mg by mouth daily.     cholecalciferol 1000 units tablet  Commonly known as:  VITAMIN D  Take 4,000 Units by mouth daily.     dexamethasone 4 MG tablet  Commonly known as:  DECADRON  Take 1 tablets by mouth once a day on the day after chemotherapy and then take 1 tablets two times a day for 2 days. Take with food.     diazepam 5 MG tablet  Commonly known as:  VALIUM  Take 5 mg by mouth daily.     diltiazem 120 MG 24 hr capsule  Commonly known as:  CARDIZEM CD  Take 1 capsule (120 mg total) by mouth daily.     doxepin 50 MG capsule  Commonly known as:  SINEQUAN  take 1 capsule by mouth once daily at bedtime     ferrous sulfate 325 (65 FE) MG tablet  Take 325 mg by  mouth daily with breakfast.     HYDROcodone-acetaminophen 5-325 MG tablet  Commonly known as:  NORCO/VICODIN  Take 1-2 tablets by mouth every 4 (four) hours as needed for moderate pain.     montelukast 10 MG tablet  Commonly known as:  SINGULAIR  Take 1 tablet (10 mg total) by mouth daily.     ondansetron 8 MG tablet  Commonly known as:  ZOFRAN  Take 1 tablet (8 mg total) by mouth 2 (two) times daily as needed. Start on the third day after chemotherapy.     PAZEO 0.7 % Soln  Generic drug:  Olopatadine  HCl  Place 1 drop into both eyes daily as needed (allergies).     potassium chloride SA 20 MEQ tablet  Commonly known as:  K-DUR,KLOR-CON  Take 20 mEq by mouth 2 (two) times daily.     prochlorperazine 10 MG tablet  Commonly known as:  COMPAZINE  Take 1 tablet (10 mg total) by mouth every 6 (six) hours as needed (Nausea or vomiting).     rivaroxaban 20 MG Tabs tablet  Commonly known as:  XARELTO  Take 1 tablet (20 mg total) by mouth daily with supper.  Start taking on:  01/23/2016     triamcinolone cream 0.1 %  Commonly known as:  KENALOG  Apply 1 application topically 2 (two) times daily.     triamterene-hydrochlorothiazide 37.5-25 MG tablet  Commonly known as:  MAXZIDE-25  Take 1 tablet by mouth daily.           Follow-up Information    Follow up with Alesia Richards, MD.   Specialty:  Internal Medicine   Contact information:   [address] [place] [address] [phone]       Call Faye Ramsay, MD.   Specialty:  Internal Medicine   Why:  As needed call my cell [phone]   Contact information:   [address] [place] [address] [phone]        The results of significant diagnostics from this hospitalization (including imaging, microbiology, ancillary and laboratory) are listed below for reference.     Microbiology: Recent Results (from the past 240 hour(s))  Culture, blood (routine x 2)      Status: None (Preliminary result)   Collection Time: 01/19/16  7:24 AM  Result Value Ref Range Status   Specimen Description BLOOD RIGHT HAND  Final   Special Requests IN PEDIATRIC BOTTLE 1CC  Final   Culture NO GROWTH 2 DAYS  Final   Report Status PENDING  Incomplete  Culture, blood (routine x 2)     Status: None (Preliminary result)   Collection Time: 01/19/16  8:03 AM  Result Value Ref Range Status   Specimen Description BLOOD RIGHT FOREARM  Final   Special Requests IN PEDIATRIC BOTTLE 1CC  Final   Culture NO GROWTH 2 DAYS  Final   Report Status PENDING  Incomplete  Urine culture     Status: None   Collection Time: 01/19/16 10:33 AM  Result Value Ref Range Status   Specimen Description URINE, CLEAN CATCH  Final   Special Requests Normal  Final   Culture MULTIPLE SPECIES PRESENT, SUGGEST RECOLLECTION  Final   Report Status 01/20/2016 FINAL  Final  C difficile quick scan w PCR reflex     Status: None   Collection Time: 01/19/16  4:57 PM  Result Value Ref Range Status   C Diff antigen NEGATIVE NEGATIVE Final   C Diff toxin NEGATIVE NEGATIVE Final   C Diff interpretation Negative for toxigenic C. difficile  Final  Gastrointestinal Panel by PCR , Stool     Status: Abnormal   Collection Time: 01/19/16  4:57 PM  Result Value Ref Range Status   Campylobacter species NOT DETECTED NOT DETECTED Final   Plesimonas shigelloides NOT DETECTED NOT DETECTED Final   Salmonella species NOT DETECTED NOT DETECTED Final   Yersinia enterocolitica NOT DETECTED NOT DETECTED Final   Vibrio species NOT DETECTED NOT DETECTED Final   Vibrio cholerae NOT DETECTED NOT DETECTED Final   Enteroaggregative E coli (EAEC) NOT DETECTED NOT DETECTED Final  Enteropathogenic E coli (EPEC) NOT DETECTED NOT DETECTED Final   Enterotoxigenic E coli (ETEC) NOT DETECTED NOT DETECTED Final   Shiga like toxin producing E coli (STEC) NOT DETECTED NOT DETECTED Final   E. coli O157 NOT DETECTED NOT DETECTED Final    Shigella/Enteroinvasive E coli (EIEC) NOT DETECTED NOT DETECTED Final   Cryptosporidium NOT DETECTED NOT DETECTED Final   Cyclospora cayetanensis NOT DETECTED NOT DETECTED Final   Entamoeba histolytica NOT DETECTED NOT DETECTED Final   Giardia lamblia NOT DETECTED NOT DETECTED Final   Adenovirus F40/41 NOT DETECTED NOT DETECTED Final   Astrovirus NOT DETECTED NOT DETECTED Final   Norovirus GI/GII DETECTED (A) NOT DETECTED Final    Comment: CRITICAL RESULT CALLED TO, READ BACK BY AND VERIFIED WITH: SHATARA POWELL AT [facility] ON 01/20/16 AT 1440. CTJ    Rotavirus A NOT DETECTED NOT DETECTED Final   Sapovirus (I, II, IV, and V) NOT DETECTED NOT DETECTED Final  Culture, blood (x 2)     Status: None (Preliminary result)   Collection Time: 01/20/16  2:53 PM  Result Value Ref Range Status   Specimen Description BLOOD RIGHT HAND  Final   Special Requests IN PEDIATRIC BOTTLE 2CC  Final   Culture NO GROWTH < 24 HOURS  Final   Report Status PENDING  Incomplete  Culture, blood (x 2)     Status: None (Preliminary result)   Collection Time: 01/20/16  3:09 PM  Result Value Ref Range Status   Specimen Description BLOOD LEFT HAND  Final   Special Requests IN PEDIATRIC BOTTLE  1CC  Final   Culture NO GROWTH < 24 HOURS  Final   Report Status PENDING  Incomplete     Labs: Basic Metabolic Panel:  Recent Labs Lab 01/19/16 0730 01/20/16 0909 01/20/16 1453 01/21/16 0631 01/22/16 0537  NA 140 139 138 139 138  K 4.1 3.1* 3.3* 3.3* 3.3*  CL 100* 106 102 103 101  CO2 25 19* '24 27 26  '$ GLUCOSE 116* 57* 75 77 76  BUN '20 12 9 '$ <5* <5*  CREATININE 0.65 0.58 0.52 0.47 0.46  CALCIUM 8.7* 7.8* 7.7* 8.0* 8.2*   Liver Function Tests:  Recent Labs Lab 01/19/16 0730 01/20/16 0909 01/20/16 1453  AST '26 20 16  '$ ALT 34 33 29  ALKPHOS 87 69 62  BILITOT 0.5 0.9 0.6  PROT 5.6* 4.7* 5.2*  ALBUMIN 3.1* 2.9* 2.8*    Recent Labs Lab 01/19/16 0730  LIPASE 20   No results for input(s): AMMONIA in the last 168  hours. CBC:  Recent Labs Lab 01/19/16 0730 01/20/16 0909 01/21/16 0631 01/22/16 0537  WBC 12.3* 0.5* 0.4* 1.5*  NEUTROABS  --   --  0.1* 1.1*  HGB 12.7 11.8* 10.5* 10.6*  HCT 38.2 37.5 31.8* 32.9*  MCV 90.3 90.8 89.6 88.2  PLT 128* 89* 74* 78*    CBG:  Recent Labs Lab 01/19/16 0650  GLUCAP 100*     SIGNED: Time coordinating discharge: 30 minutes  MAGICK-Zahlia Deshazer, MD  [facility] 01/22/2016, 10:40 AM Pager [phone]  If 7PM-7AM, please contact night-coverage [email or web address] Password [email or web address]

## 2016-01-22 NOTE — Progress Notes (Signed)
Nsg Discharge Note  Admit Date:  01/19/2016 Discharge date: 01/22/2016   [name] to be D/C'd home per MD order.  AVS completed.  Copy for chart, and copy for patient signed, and dated. Patient able to verbalize understanding.  Discharge Medication:   Medication List    TAKE these medications        atenolol 100 MG tablet  Commonly known as:  TENORMIN  Take 100 mg by mouth daily.     cholecalciferol 1000 units tablet  Commonly known as:  VITAMIN D  Take 4,000 Units by mouth daily.     dexamethasone 4 MG tablet  Commonly known as:  DECADRON  Take 1 tablets by mouth once a day on the day after chemotherapy and then take 1 tablets two times a day for 2 days. Take with food.     diazepam 5 MG tablet  Commonly known as:  VALIUM  Take 5 mg by mouth daily.     diltiazem 120 MG 24 hr capsule  Commonly known as:  CARDIZEM CD  Take 1 capsule (120 mg total) by mouth daily.     doxepin 50 MG capsule  Commonly known as:  SINEQUAN  take 1 capsule by mouth once daily at bedtime     ferrous sulfate 325 (65 FE) MG tablet  Take 325 mg by mouth daily with breakfast.     HYDROcodone-acetaminophen 5-325 MG tablet  Commonly known as:  NORCO/VICODIN  Take 1-2 tablets by mouth every 4 (four) hours as needed for moderate pain.     montelukast 10 MG tablet  Commonly known as:  SINGULAIR  Take 1 tablet (10 mg total) by mouth daily.     ondansetron 8 MG tablet  Commonly known as:  ZOFRAN  Take 1 tablet (8 mg total) by mouth 2 (two) times daily as needed. Start on the third day after chemotherapy.     PAZEO 0.7 % Soln  Generic drug:  Olopatadine HCl  Place 1 drop into both eyes daily as needed (allergies).     potassium chloride SA 20 MEQ tablet  Commonly known as:  K-DUR,KLOR-CON  Take 20 mEq by mouth 2 (two) times daily.     prochlorperazine 10 MG tablet  Commonly known as:  COMPAZINE  Take 1 tablet (10 mg total) by mouth every 6 (six) hours as needed (Nausea or vomiting).      rivaroxaban 20 MG Tabs tablet  Commonly known as:  XARELTO  Take 1 tablet (20 mg total) by mouth daily with supper.  Start taking on:  01/23/2016     triamcinolone cream 0.1 %  Commonly known as:  KENALOG  Apply 1 application topically 2 (two) times daily.     triamterene-hydrochlorothiazide 37.5-25 MG tablet  Commonly known as:  MAXZIDE-25  Take 1 tablet by mouth daily.        Discharge Assessment: Filed Vitals:   01/22/16 0606 01/22/16 0937  BP: 131/59 123/58  Pulse: 81 102  Temp: 98.7 F (37.1 C)   Resp: 18    Skin clean, dry and intact without evidence of skin break down, no evidence of skin tears noted. IV catheter discontinued with catheter tip intact. Site without signs and symptoms of complications - no redness or edema noted at insertion site, patient denies c/o pain - only slight tenderness at site.  Dressing with slight pressure applied.  D/c Instructions-Education: Discharge instructions given to patient with verbalized understanding. D/c education completed with patient including follow up instructions,  medication list, d/c activities limitations if indicated, with other d/c instructions as indicated by MD - patient able to verbalize understanding, all questions fully answered. Patient instructed to return to ED, call 911, or call MD for any changes in condition.  Patient escorted via [facility] by volunteer services, and D/C home via private auto.  Dorita Fray, RN 01/22/2016 12:54PM

## 2016-01-24 LAB — CULTURE, BLOOD (ROUTINE X 2)
CULTURE: NO GROWTH
Culture: NO GROWTH

## 2016-01-25 LAB — CULTURE, BLOOD (ROUTINE X 2)
CULTURE: NO GROWTH
Culture: NO GROWTH

## 2016-01-26 ENCOUNTER — Telehealth: Payer: Self-pay | Admitting: Genetic Counselor

## 2016-01-26 NOTE — Telephone Encounter (Signed)
Attempted to call to reschedule genetic appt phone just rings

## 2016-01-27 ENCOUNTER — Ambulatory Visit (INDEPENDENT_AMBULATORY_CARE_PROVIDER_SITE_OTHER): Payer: BC Managed Care – PPO | Admitting: Internal Medicine

## 2016-01-27 ENCOUNTER — Encounter: Payer: Self-pay | Admitting: Internal Medicine

## 2016-01-27 VITALS — BP 104/68 | HR 88 | Temp 97.3°F | Resp 16 | Ht 62.0 in | Wt 177.2 lb

## 2016-01-27 DIAGNOSIS — E860 Dehydration: Secondary | ICD-10-CM | POA: Diagnosis not present

## 2016-01-27 DIAGNOSIS — I10 Essential (primary) hypertension: Secondary | ICD-10-CM

## 2016-01-27 DIAGNOSIS — E559 Vitamin D deficiency, unspecified: Secondary | ICD-10-CM | POA: Diagnosis not present

## 2016-01-27 DIAGNOSIS — R1084 Generalized abdominal pain: Secondary | ICD-10-CM

## 2016-01-27 DIAGNOSIS — Z79899 Other long term (current) drug therapy: Secondary | ICD-10-CM | POA: Diagnosis not present

## 2016-01-27 DIAGNOSIS — R7303 Prediabetes: Secondary | ICD-10-CM | POA: Diagnosis not present

## 2016-01-27 DIAGNOSIS — E785 Hyperlipidemia, unspecified: Secondary | ICD-10-CM | POA: Diagnosis not present

## 2016-01-27 DIAGNOSIS — G4700 Insomnia, unspecified: Secondary | ICD-10-CM | POA: Diagnosis not present

## 2016-01-27 LAB — CBC WITH DIFFERENTIAL/PLATELET
Basophils Absolute: 88 cells/uL (ref 0–200)
Basophils Relative: 1 %
EOS PCT: 0 %
Eosinophils Absolute: 0 cells/uL — ABNORMAL LOW (ref 15–500)
HCT: 35 % (ref 35.0–45.0)
Hemoglobin: 11.5 g/dL — ABNORMAL LOW (ref 11.7–15.5)
LYMPHS PCT: 9 %
Lymphs Abs: 792 cells/uL — ABNORMAL LOW (ref 850–3900)
MCH: 29.1 pg (ref 27.0–33.0)
MCHC: 32.9 g/dL (ref 32.0–36.0)
MCV: 88.6 fL (ref 80.0–100.0)
MONOS PCT: 7 %
MPV: 8.7 fL (ref 7.5–12.5)
Monocytes Absolute: 616 cells/uL (ref 200–950)
NEUTROS PCT: 83 %
Neutro Abs: 7304 cells/uL (ref 1500–7800)
PLATELETS: 181 10*3/uL (ref 140–400)
RBC: 3.95 MIL/uL (ref 3.80–5.10)
RDW: 16.2 % — AB (ref 11.0–15.0)
WBC: 8.8 10*3/uL (ref 3.8–10.8)

## 2016-01-27 LAB — BASIC METABOLIC PANEL WITH GFR
BUN: 7 mg/dL (ref 7–25)
CALCIUM: 8.8 mg/dL (ref 8.6–10.4)
CO2: 26 mmol/L (ref 20–31)
Chloride: 101 mmol/L (ref 98–110)
Creat: 0.56 mg/dL (ref 0.50–0.99)
GLUCOSE: 76 mg/dL (ref 65–99)
Potassium: 3.8 mmol/L (ref 3.5–5.3)
SODIUM: 139 mmol/L (ref 135–146)

## 2016-01-27 LAB — HEPATIC FUNCTION PANEL
ALT: 14 U/L (ref 6–29)
AST: 13 U/L (ref 10–35)
Albumin: 3.4 g/dL — ABNORMAL LOW (ref 3.6–5.1)
Alkaline Phosphatase: 61 U/L (ref 33–130)
BILIRUBIN DIRECT: 0.1 mg/dL (ref <=0.2)
BILIRUBIN INDIRECT: 0.3 mg/dL (ref 0.2–1.2)
BILIRUBIN TOTAL: 0.4 mg/dL (ref 0.2–1.2)
Total Protein: 5.6 g/dL — ABNORMAL LOW (ref 6.1–8.1)

## 2016-01-27 LAB — LIPID PANEL
Cholesterol: 227 mg/dL — ABNORMAL HIGH (ref 125–200)
HDL: 48 mg/dL (ref >=46)
LDL Cholesterol: 139 mg/dL — ABNORMAL HIGH (ref <130)
Total CHOL/HDL Ratio: 4.7 Ratio (ref <=5.0)
Triglycerides: 200 mg/dL — ABNORMAL HIGH (ref <150)
VLDL: 40 mg/dL — ABNORMAL HIGH (ref <30)

## 2016-01-27 LAB — TSH: TSH: 0.82 m[IU]/L

## 2016-01-27 LAB — MAGNESIUM: MAGNESIUM: 1.8 mg/dL (ref 1.5–2.5)

## 2016-01-27 NOTE — Patient Instructions (Signed)

## 2016-01-27 NOTE — Progress Notes (Addendum)
Subjective:    Patient ID: [name], female    DOB: 1956-02-18, 60 y.o.   MRN: [id number]  HPI   This very nice 60 yo WBF with recent dx/o R Breast Ca in Feb had confirmatory Bx on 12/17/2015 and has now completed 2 of 4 cycles of Cytoxan/Adriamycinin in anticipation of definitive surgery. She was recently hospitalized for Abdominal Pain & Dehydration 4/3-03/2016 with negative w/u and rescue with rehydration IVF. On discharge day (April 6), patient was contacted by clinical staff and she reported she was feeling better since initialized hospitalized. Over the last 4 days at home, patient has had no more Abdominal pains, N/V, or Diarrhea and gas had good appetite and apparently is taking diet & liquids well.     Pt's other problems include HTN, HLD, pAfib on Xarelto and her maintenance meds have been resumed. Patient denies any HA's , dizziness, CP, palpitations, dyspnea or dependent edema. As above GI sx's resolved and she denies any GU, or MS sx's. She's resumed all of her pre-hospital meds on schedule.   Medication Sig  . atenolol  100 MG tablet Take 100 mg by mouth daily.  Marland Kitchen VITAMIN D 1000 UNITS tablet Take 4,000 Units by mouth daily.  Marland Kitchen dexamethasone  4 MG tablet Patient taking  Take 1 tablet  once a day on the day after chemotherapy and then take 1 tab two times a day for 2 days. - Chemotherapy on Thursdays  . diazepam  5 MG  Take 5 mg by mouth daily.  . Diltiazem-CD 120 MG  Take 1 capsule (120 mg total) by mouth daily.  Marland Kitchen doxepin  50 MG  take 1 capsule by mouth once daily at bedtime  . ferrous sulfate 325 MG  Take 325 mg by mouth daily with breakfast.  . NORCO 5-325 MG  Take 1-2 tablets by mouth every 4 (four) hours as needed for moderate pain.  . montelukast10 MG Take 1 tablet (10 mg total) by mouth daily. (Patient taking differently: Take 10 mg by mouth daily as needed (allergies). )  . ZOFRAN 8 MG tablet Take 1 tablet (8 mg total) by mouth 2 (two) times daily as needed. Start on the  third day after chemotherapy.  Marland Kitchen PAZEO 0.7 % SOLN Place 1 drop into both eyes daily as needed (allergies).   . potassium chloride 20 MEQ  Take 20 mEq by mouth 2 (two) times daily.  . prochlorperazine 10 MG  Take 1 tablet (10 mg total) by mouth every 6 (six) hours as needed (Nausea or vomiting).  Alveda Reasons 20 MG TABS Take 1 tablet (20 mg total) by mouth daily with supper.  . triamcinolone cream (KENALOG) 0.1 % Apply 1 application topically 2 (two) times daily.  Marland Kitchen triamterene-hctz (MAXZIDE-25) 37.5-25  Take 1 tablet by mouth daily.   Allergies  Allergen Reactions  . Penicillins Other (See Comments)   Past Medical History  Diagnosis Date  . MRSA (methicillin resistant staph aureus) culture positive 2009     none since per patient  . Eczema   . Hypercholesteremia   . Hypertension   . Kidney stones   . Anemia   . Vitamin D deficiency   . Paroxysmal atrial fibrillation ([place]) 10/2011, 06/2015  . GERD (gastroesophageal reflux disease)   . Atrial fibrillation ([place]) 2016  . Dysrhythmia     A FIB - followed by Dr. Meda Coffee  . Breast cancer of upper-outer quadrant of right female breast ([place]) 12/12/2015  . Breast cancer (  [place])   . Complication of anesthesia 10-06-15     slow to awaken after   . PONV (postoperative nausea and vomiting) 10-06-15    admitted back to hospital for dehydration   Past Surgical History  Procedure Laterality Date  . Breast surgery Left     biopsy-neg  . Kidney stone surgery  2013  . Nephrolithotomy Right 10/06/2015    Procedure: NEPHROLITHOTOMY PERCUTANEOUS;  Surgeon: Kathie Rhodes, MD;  Location: [facility];  Service: Urology;  Laterality: Right;  . Cystoscopy with holmium laser lithotripsy Right 10/06/2015    Procedure: CYSTOSCOPY WITH HOLMIUM LASER LITHOTRIPSY;  Surgeon: Kathie Rhodes, MD;  Location: [facility];  Service: Urology;  Laterality: Right;  . Portacath placement Left 12/23/2015    Procedure: INSERTION PORT-A-CATH WITH [place];  Surgeon: Stark Klein, MD;  Location:  [facility];  Service: General;  Laterality: Left;   Review of Systems  Patient denies No Fever-chills,  No Headache, No changes with Vision or hearing,  No problems swallowing food or Liquids,  No Chest pain or productive Cough or Shortness of Breath,  No Abdominal pain, No Nausea or Vomitting, Bowel movements are regular,  No Blood in stool or Urine,  No dysuria,  No new skin rashes or bruises,  No new joints pains-aches,  No new weakness, tingling, numbness in any extremity,  No recent weight loss,  No polyuria, polydypsia or polyphagia,  No significant Mental Stressors.  A full 10 point Review of Systems was done, except as stated above, all other Review of Systems were negative    Objective:   Physical Exam  BP 104/68 mmHg  Pulse 88  Temp(Src) 97.3 F (36.3 C)  Resp 16  Ht 5\' 2"  (1.575 m)  Wt 177 lb 3.2 oz (80.377 kg)  BMI 32.40 kg/m2  HEENT - Eac's patent. TM's Nl. EOM's full. PERRLA. NasoOroPharynx clear. Neck - supple. Nl Thyroid. Carotids 2+ & No bruits, nodes, JVD Chest - Clear equal BS w/o Rales, rhonchi, wheezes. Cor - Nl HS. RRR w/o sig MGR. PP 1(+). No edema. Abd - No palpable organomegaly, masses or tenderness. BS nl. MS- FROM w/o deformities. Muscle power, tone and bulk Nl. Gait Nl. Neuro - No obvious Cr N abnormalities. Sensory, motor and Cerebellar functions appear Nl w/o focal abnormalities.    Assessment & Plan:   1. Generalized abdominal pain, recovered   2. Dehydration, resolved   3. Essential hypertension  - TSH  4. Hyperlipemia  - Lipid panel  5. Prediabetes  - Hemoglobin A1c - Insulin, random  6. Vitamin D deficiency  - VITAMIN D 25 Hydroxy   7. Medication management  - CBC with Differential/Platelet - BASIC METABOLIC PANEL WITH GFR - Hepatic function panel - Magnesium  - discussed meds/SE's

## 2016-01-28 ENCOUNTER — Other Ambulatory Visit: Payer: Self-pay

## 2016-01-28 ENCOUNTER — Encounter: Payer: BC Managed Care – PPO | Admitting: Nurse Practitioner

## 2016-01-28 ENCOUNTER — Other Ambulatory Visit: Payer: BC Managed Care – PPO

## 2016-01-28 ENCOUNTER — Telehealth: Payer: Self-pay | Admitting: Nurse Practitioner

## 2016-01-28 ENCOUNTER — Ambulatory Visit (HOSPITAL_BASED_OUTPATIENT_CLINIC_OR_DEPARTMENT_OTHER): Payer: BC Managed Care – PPO | Admitting: Nurse Practitioner

## 2016-01-28 ENCOUNTER — Ambulatory Visit (HOSPITAL_BASED_OUTPATIENT_CLINIC_OR_DEPARTMENT_OTHER): Payer: BC Managed Care – PPO

## 2016-01-28 VITALS — BP 97/62 | HR 79 | Temp 98.6°F | Resp 18 | Ht 62.0 in | Wt 178.8 lb

## 2016-01-28 DIAGNOSIS — C50411 Malignant neoplasm of upper-outer quadrant of right female breast: Secondary | ICD-10-CM

## 2016-01-28 DIAGNOSIS — Z5111 Encounter for antineoplastic chemotherapy: Secondary | ICD-10-CM | POA: Diagnosis not present

## 2016-01-28 DIAGNOSIS — C773 Secondary and unspecified malignant neoplasm of axilla and upper limb lymph nodes: Secondary | ICD-10-CM | POA: Diagnosis not present

## 2016-01-28 DIAGNOSIS — L97111 Non-pressure chronic ulcer of right thigh limited to breakdown of skin: Secondary | ICD-10-CM | POA: Diagnosis not present

## 2016-01-28 DIAGNOSIS — G4700 Insomnia, unspecified: Secondary | ICD-10-CM

## 2016-01-28 LAB — HEMOGLOBIN A1C
HEMOGLOBIN A1C: 6.1 % — AB (ref <5.7)
MEAN PLASMA GLUCOSE: 128 mg/dL

## 2016-01-28 LAB — VITAMIN D 25 HYDROXY (VIT D DEFICIENCY, FRACTURES): VIT D 25 HYDROXY: 42 ng/mL (ref 30–100)

## 2016-01-28 LAB — INSULIN, RANDOM: INSULIN: 8.5 u[IU]/mL (ref 2.0–19.6)

## 2016-01-28 MED ORDER — DOXORUBICIN HCL CHEMO IV INJECTION 2 MG/ML
60.0000 mg/m2 | Freq: Once | INTRAVENOUS | Status: AC
  Administered 2016-01-28: 112 mg via INTRAVENOUS
  Filled 2016-01-28: qty 56

## 2016-01-28 MED ORDER — PEGFILGRASTIM 6 MG/0.6ML ~~LOC~~ PSKT
6.0000 mg | PREFILLED_SYRINGE | Freq: Once | SUBCUTANEOUS | Status: AC
  Administered 2016-01-28: 6 mg via SUBCUTANEOUS
  Filled 2016-01-28: qty 0.6

## 2016-01-28 MED ORDER — LORAZEPAM 0.5 MG PO TABS: 0.5000 mg | ORAL_TABLET | Freq: Four times a day (QID) | ORAL | Status: DC | PRN

## 2016-01-28 MED ORDER — SODIUM CHLORIDE 0.9% FLUSH
10.0000 mL | INTRAVENOUS | Status: DC | PRN
  Administered 2016-01-28: 10 mL
  Filled 2016-01-28: qty 10

## 2016-01-28 MED ORDER — PALONOSETRON HCL INJECTION 0.25 MG/5ML
INTRAVENOUS | Status: AC
  Filled 2016-01-28: qty 5

## 2016-01-28 MED ORDER — SODIUM CHLORIDE 0.9 % IV SOLN
Freq: Once | INTRAVENOUS | Status: AC
  Administered 2016-01-28: 15:00:00 via INTRAVENOUS
  Filled 2016-01-28: qty 5

## 2016-01-28 MED ORDER — SULFAMETHOXAZOLE-TRIMETHOPRIM 800-160 MG PO TABS: 1.0000 | ORAL_TABLET | Freq: Two times a day (BID) | ORAL | Status: DC

## 2016-01-28 MED ORDER — DEXAMETHASONE 4 MG PO TABS: 4.0000 mg | ORAL_TABLET | ORAL | Status: DC

## 2016-01-28 MED ORDER — SODIUM CHLORIDE 0.9 % IV SOLN
600.0000 mg/m2 | Freq: Once | INTRAVENOUS | Status: AC
  Administered 2016-01-28: 1120 mg via INTRAVENOUS
  Filled 2016-01-28: qty 56

## 2016-01-28 MED ORDER — PALONOSETRON HCL INJECTION 0.25 MG/5ML
0.2500 mg | Freq: Once | INTRAVENOUS | Status: AC
  Administered 2016-01-28: 0.25 mg via INTRAVENOUS

## 2016-01-28 MED ORDER — SODIUM CHLORIDE 0.9 % IV SOLN
Freq: Once | INTRAVENOUS | Status: AC
  Administered 2016-01-28: 15:00:00 via INTRAVENOUS

## 2016-01-28 MED ORDER — HEPARIN SOD (PORK) LOCK FLUSH 100 UNIT/ML IV SOLN
500.0000 [IU] | Freq: Once | INTRAVENOUS | Status: AC | PRN
  Administered 2016-01-28: 500 [IU]
  Filled 2016-01-28: qty 5

## 2016-01-28 NOTE — Patient Instructions (Signed)
[facility] Discharge Instructions for Patients Receiving Chemotherapy  Today you received the following chemotherapy agents:Adriamycin and Cytoxan   To help prevent nausea and vomiting after your treatment, we encourage you to take your nausea medication as directed.    If you develop nausea and vomiting that is not controlled by your nausea medication, call the clinic.   BELOW ARE SYMPTOMS THAT SHOULD BE REPORTED IMMEDIATELY:  *FEVER GREATER THAN 100.5 F  *CHILLS WITH OR WITHOUT FEVER  NAUSEA AND VOMITING THAT IS NOT CONTROLLED WITH YOUR NAUSEA MEDICATION  *UNUSUAL SHORTNESS OF BREATH  *UNUSUAL BRUISING OR BLEEDING  TENDERNESS IN MOUTH AND THROAT WITH OR WITHOUT PRESENCE OF ULCERS  *URINARY PROBLEMS  *BOWEL PROBLEMS  UNUSUAL RASH Items with * indicate a potential emergency and should be followed up as soon as possible.  Feel free to call the clinic you have any questions or concerns. The clinic phone number is [phone].  Please show the CHEMO ALERT CARD at check-in to the Emergency Department and triage nurse.   

## 2016-01-28 NOTE — Telephone Encounter (Signed)
Adjusted pt appt per 4/12 pof. Pt aware of new times

## 2016-01-28 NOTE — Telephone Encounter (Signed)
per pof s/b est appt-chgd from [facility] to est

## 2016-01-28 NOTE — Progress Notes (Signed)
Patient was recently hospitalized and saw her PCP yesterday however she is having chemotherapy today, Dr.Gudena is not here today so patient will see Selena Lesser, NP in [facility] prior to having her chemotherapy.

## 2016-01-30 ENCOUNTER — Encounter (HOSPITAL_COMMUNITY): Payer: Self-pay

## 2016-01-30 ENCOUNTER — Encounter: Payer: Self-pay | Admitting: Nurse Practitioner

## 2016-01-30 ENCOUNTER — Ambulatory Visit (HOSPITAL_COMMUNITY)
Admission: RE | Admit: 2016-01-30 | Discharge: 2016-01-30 | Disposition: A | Discharge status: Home / Self Care | Payer: BC Managed Care – PPO | Source: Ambulatory Visit | Attending: Internal Medicine | Admitting: Internal Medicine

## 2016-01-30 ENCOUNTER — Telehealth: Payer: Self-pay | Admitting: *Deleted

## 2016-01-30 DIAGNOSIS — J849 Interstitial pulmonary disease, unspecified: Secondary | ICD-10-CM | POA: Diagnosis not present

## 2016-01-30 DIAGNOSIS — R918 Other nonspecific abnormal finding of lung field: Secondary | ICD-10-CM | POA: Insufficient documentation

## 2016-01-30 DIAGNOSIS — R942 Abnormal results of pulmonary function studies: Secondary | ICD-10-CM | POA: Insufficient documentation

## 2016-01-30 DIAGNOSIS — C50911 Malignant neoplasm of unspecified site of right female breast: Secondary | ICD-10-CM | POA: Insufficient documentation

## 2016-01-30 DIAGNOSIS — L97111 Non-pressure chronic ulcer of right thigh limited to breakdown of skin: Secondary | ICD-10-CM | POA: Insufficient documentation

## 2016-01-30 MED ORDER — HEPARIN SOD (PORK) LOCK FLUSH 100 UNIT/ML IV SOLN
500.0000 [IU] | INTRAVENOUS | Status: AC | PRN
  Administered 2016-01-30: 500 [IU]

## 2016-01-30 MED ORDER — SODIUM CHLORIDE 0.9% FLUSH
10.0000 mL | INTRAVENOUS | Status: DC | PRN
  Administered 2016-01-30: 10 mL

## 2016-01-30 MED ORDER — SODIUM CHLORIDE 0.9% FLUSH: 10.0000 mL | Freq: Two times a day (BID) | INTRAVENOUS | Status: DC

## 2016-01-30 MED ORDER — IOPAMIDOL (ISOVUE-300) INJECTION 61%
INTRAVENOUS | Status: AC
  Administered 2016-01-30: 75 mL
  Filled 2016-01-30: qty 75

## 2016-01-30 NOTE — Assessment & Plan Note (Signed)
Patient states that she has some irritation to her bilateral inner thighs where her legs are rubbing.  She states that one area has rubbed to the point of skin breakdown.  This area is tender.  Exam today reveals some erythema to bilateral upper inner thighs near the groin.  She also has a 1 cm in diameter open lesion to the right inner thigh.  There is no evidence of edema, warmth, or red streaks.  Advised patient that most likely this is due to her legs rubbing together.  Obtained a culture of the right inner thigh lesion.  Advised patient to try keep the area as dry as possible.  Suggested that patient try wearing cotton gym shorts or placing a dressing to the sites to prevent rubbing.  Advised patient would prescribe Bactrim antibiotics prophylactically to prevent any skin infections.  Patient was advised to let us know if inner thigh irritation does not improve.

## 2016-01-30 NOTE — Progress Notes (Signed)
SYMPTOM MANAGEMENT CLINIC    Chief Complaint: Follow-up, right inner thigh skin lesion  HPI:  [name] 60 y.o. female diagnosed with breast cancer.  Currently undergoing dose dense AC chemotherapy regimen.   Patient presents to the [facility] today to receive cycle 3 of her dose dense AC chemotherapy regimen.  She states that she is feeling fairly well.  She denies any recent fevers or chills.  Patient is requesting a refill of both her dexamethasone and Ativan today.  Patient states that she saw her primary care physician Dr. Melford Aase just yesterday; had a battery of blood test done at that time.  Labs obtained just yesterday per her primary care physician reveal a WBC of 8.8, ANC 3.9, hemoglobin 11.5, and platelet count 181.  Vitals obtained today were all within normal limits; with the exception of the mild hypotension at 97/62.  Of note-patient takes 3 different medications that can lower her blood pressure.:  Maxzide, atenolol, and Cardizem.  Advised patient to recheck her blood pressure when she arrives;; let us know if it remains low.  May need to consider adjusting patient's blood pressure medications if blood pressure remains low.  Patient will proceed today with cycle 3 of her chemotherapy as planned.  She will receive a Neulasta injection following her chemotherapy.  Patient is scheduled for restaging CT of her chest on 01/30/2016.  She is scheduled for labs, flush, visit, and chemotherapy on 02/11/2016.    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 Differential diagnosis complete replacement of lymph node versus direct extension    Review of Systems  Constitutional: Positive for malaise/fatigue.  Skin:       Irritation to bilateral inner thighs  All other systems reviewed and are negative.   Past Medical History  Diagnosis Date  . MRSA (methicillin resistant staph aureus) culture positive 2009     none since per patient  . Eczema   . Hypercholesteremia   . Hypertension   . Kidney stones   . Anemia   . Vitamin D deficiency   . Paroxysmal atrial fibrillation ([place]) 10/2011, 06/2015  . GERD (gastroesophageal reflux disease)   . Atrial fibrillation ([place]) 2016  . Dysrhythmia     A FIB - followed by Dr. Meda Coffee  . Breast cancer of upper-outer quadrant of right female breast ([place]) 12/12/2015  . Breast cancer ([place])   . Complication of anesthesia 10-06-15     slow to awaken after   . PONV (postoperative nausea and vomiting) 10-06-15    admitted back to hospital for dehydration    Past Surgical History  Procedure Laterality Date  . Breast surgery Left     biopsy-neg  . Kidney stone surgery  2013  .  Nephrolithotomy Right 10/06/2015    Procedure: NEPHROLITHOTOMY PERCUTANEOUS;  Surgeon: Kathie Rhodes, MD;  Location: [facility];  Service: Urology;  Laterality: Right;  . Cystoscopy with holmium laser lithotripsy Right 10/06/2015    Procedure: CYSTOSCOPY WITH HOLMIUM LASER LITHOTRIPSY;  Surgeon: Kathie Rhodes, MD;  Location: [facility];  Service: Urology;  Laterality: Right;  . Portacath placement Left 12/23/2015    Procedure: INSERTION PORT-A-CATH WITH [place];  Surgeon: Stark Klein, MD;  Location: [facility];  Service: General;  Laterality: Left;    has Mixed hyperlipidemia; Kidney stones; Vitamin D deficiency; Prediabetes; Obesity; Atrial fibrillation with RVR ([place]); Nausea after anesthesia; PAF (paroxysmal atrial fibrillation) ([place]); Essential hypertension; Breast cancer of upper-outer quadrant of right female breast ([place]); Enlarged RV (right ventricle); DOE (dyspnea on exertion); Atrial fibrillation ([place]); and Skin ulcer of right thigh, limited to breakdown of skin ([place]) on her problem list.    is allergic to penicillins.    Medication List       This list is accurate as of: 01/28/16 11:59 PM.  Always use your most recent med list.               atenolol 100 MG tablet  Commonly known as:  TENORMIN  Take 100 mg by mouth daily.     cholecalciferol 1000 units tablet  Commonly known as:  VITAMIN D  Take 4,000 Units by mouth daily.     dexamethasone 4 MG tablet  Commonly known as:  DECADRON  Take 1 tablet (4 mg total) by mouth See admin instructions. Take 1 tablets by mouth once a day on the day after chemotherapy and then take 1 tablets two times a day for 2 days. Take with  food. Chemotherapy on Thursdays.     diazepam 5 MG tablet  Commonly known as:  VALIUM  Take 5 mg by mouth daily.     diltiazem 120 MG 24 hr capsule  Commonly known as:  CARDIZEM CD  Take 1 capsule (120 mg total) by mouth daily.     doxepin 50 MG capsule  Commonly known as:  SINEQUAN  take 1 capsule by mouth once daily at bedtime     ferrous sulfate 325 (65 FE) MG tablet  Take 325 mg by mouth daily with breakfast.     HYDROcodone-acetaminophen 5-325 MG tablet  Commonly known as:  NORCO/VICODIN  Take 1-2 tablets by mouth every 4 (four) hours as needed for moderate pain.     LORazepam 0.5 MG tablet  Commonly known as:  ATIVAN  Take 1 tablet (0.5 mg total) by mouth every 6 (six) hours as needed (nausea).     montelukast 10 MG tablet  Commonly known as:  SINGULAIR  Take 1  tablet (10 mg total) by mouth daily.     ondansetron 8 MG tablet  Commonly known as:  ZOFRAN  Take 1 tablet (8 mg total) by mouth 2 (two) times daily as needed. Start on the third day after chemotherapy.     oxyCODONE-acetaminophen 5-325 MG tablet  Commonly known as:  PERCOCET/ROXICET     PAZEO 0.7 % Soln  Generic drug:  Olopatadine HCl  Place 1 drop into both eyes daily as needed (allergies).     potassium chloride SA 20 MEQ tablet  Commonly known as:  K-DUR,KLOR-CON  Take 20 mEq by mouth 2 (two) times daily.     prochlorperazine 10 MG tablet  Commonly known as:  COMPAZINE  Take 1 tablet (10 mg total) by mouth every 6 (  six) hours as needed (Nausea or vomiting).     rivaroxaban 20 MG Tabs tablet  Commonly known as:  XARELTO  Take 1 tablet (20 mg total) by mouth daily with supper.     sulfamethoxazole-trimethoprim 800-160 MG tablet  Commonly known as:  BACTRIM DS,SEPTRA DS  Take 1 tablet by mouth 2 (two) times daily.     triamcinolone cream 0.1 %  Commonly known as:  KENALOG  Apply 1 application topically 2 (two) times daily.     triamterene-hydrochlorothiazide 37.5-25 MG tablet  Commonly known as:  MAXZIDE-25  Take 1 tablet by mouth daily.         PHYSICAL EXAMINATION  Oncology Vitals 01/28/2016 01/27/2016  Height 158 cm 158 cm  Weight 81.103 kg 80.377 kg  Weight (lbs) 178 lbs 13 oz 177 lbs 3 oz  BMI (kg/m2) 32.7 kg/m2 32.41 kg/m2  Temp 98.6 97.3  Pulse 79 88  Resp 18 16  SpO2 100 -  BSA (m2) 1.88 m2 1.88 m2   BP Readings from Last 2 Encounters:  01/28/16 97/62  01/27/16 104/68    Physical Exam  Constitutional: She is oriented to person, place, and time and well-developed, well-nourished, and in no distress.  HENT:  Head: Normocephalic and atraumatic.  Mouth/Throat: Oropharynx is clear and moist.  Eyes: Conjunctivae and EOM are normal. Pupils are equal, round, and reactive to light. Right eye exhibits no discharge. Left eye exhibits no discharge. No  scleral icterus.  Neck: Normal range of motion. Neck supple. No JVD present. No tracheal deviation present. No thyromegaly present.  Cardiovascular: Normal rate, regular rhythm, normal heart sounds and intact distal pulses.   Pulmonary/Chest: Effort normal and breath sounds normal. No respiratory distress. She has no wheezes. She has no rales. She exhibits no tenderness.  Abdominal: Soft. Bowel sounds are normal. She exhibits no distension and no mass. There is no tenderness. There is no rebound and no guarding.  Musculoskeletal: Normal range of motion. She exhibits no edema or tenderness.  Lymphadenopathy:    She has no cervical adenopathy.  Neurological: She is alert and oriented to person, place, and time. Gait normal.  Skin: Skin is warm and dry. No rash noted. There is erythema. No pallor.  Patient has erythema/irritation to bilateral inner upper thighs.  Also has an approximately 1 cm in diameter open lesion to the right inner thigh that appears to have developed from her thighs rubbing together.  No evidence of infection noted.  Psychiatric: Affect normal.    LABORATORY DATA:. Appointment on 01/28/2016  Component Date Value Ref Range Status  . Gram Stain Result 01/28/2016 Final report   Preliminary  . Result 1 01/28/2016 Comment   Preliminary   No white blood cells seen.  Marland Kitchen RESULT 2 01/28/2016 No organisms seen   Preliminary  Office Visit on 01/27/2016  Component Date Value Ref Range Status  . WBC 01/27/2016 8.8  3.8 - 10.8 K/uL Final  . RBC 01/27/2016 3.95  3.80 - 5.10 MIL/uL Final  . Hemoglobin 01/27/2016 11.5* 11.7 - 15.5 g/dL Final  . HCT 01/27/2016 35.0  35.0 - 45.0 % Final  . MCV 01/27/2016 88.6  80.0 - 100.0 fL Final  . MCH 01/27/2016 29.1  27.0 - 33.0 pg Final  . MCHC 01/27/2016 32.9  32.0 - 36.0 g/dL Final  . RDW 01/27/2016 16.2* 11.0 - 15.0 % Final  . Platelets 01/27/2016 181  140 - 400 K/uL Final  . MPV 01/27/2016 8.7  7.5 - 12.5 fL Final  .  Neutro Abs 01/27/2016  7304  1500 - 7800 cells/uL Final  . Lymphs Abs 01/27/2016 792* 850 - 3900 cells/uL Final  . Monocytes Absolute 01/27/2016 616  200 - 950 cells/uL Final  . Eosinophils Absolute 01/27/2016 0* 15 - 500 cells/uL Final  . Basophils Absolute 01/27/2016 88  0 - 200 cells/uL Final  . Neutrophils Relative % 01/27/2016 83   Final  . Lymphocytes Relative 01/27/2016 9   Final  . Monocytes Relative 01/27/2016 7   Final  . Eosinophils Relative 01/27/2016 0   Final  . Basophils Relative 01/27/2016 1   Final  . Smear Review 01/27/2016 SEE NOTE   Final   Comment: Mild left shift (1-5% metas, occ myelo, occ bands) ** Please note change in unit of measure and reference range(s). **   . Sodium 01/27/2016 139  135 - 146 mmol/L Final  . Potassium 01/27/2016 3.8  3.5 - 5.3 mmol/L Final  . Chloride 01/27/2016 101  98 - 110 mmol/L Final  . CO2 01/27/2016 26  20 - 31 mmol/L Final  . Glucose, Bld 01/27/2016 76  65 - 99 mg/dL Final  . BUN 01/27/2016 7  7 - 25 mg/dL Final  . Creat 01/27/2016 0.56  0.50 - 0.99 mg/dL Final  . Calcium 01/27/2016 8.8  8.6 - 10.4 mg/dL Final  . GFR, Est African American 01/27/2016 >89  >=60 mL/min Final  . GFR, Est Non African American 01/27/2016 >89  >=60 mL/min Final   Comment:   The estimated GFR is a calculation valid for adults (>=85 years old) that uses the CKD-EPI algorithm to adjust for age and sex. It is   not to be used for children, pregnant women, hospitalized patients,    patients on dialysis, or with rapidly changing kidney function. According to the NKDEP, eGFR >89 is normal, 60-89 shows mild impairment, 30-59 shows moderate impairment, 15-29 shows severe impairment and <15 is ESRD.     Marland Kitchen Total Bilirubin 01/27/2016 0.4  0.2 - 1.2 mg/dL Final  . Bilirubin, Direct 01/27/2016 0.1  <=0.2 mg/dL Final  . Indirect Bilirubin 01/27/2016 0.3  0.2 - 1.2 mg/dL Final  . Alkaline Phosphatase 01/27/2016 61  33 - 130 U/L Final  . AST 01/27/2016 13  10 - 35 U/L Final  . ALT  01/27/2016 14  6 - 29 U/L Final  . Total Protein 01/27/2016 5.6* 6.1 - 8.1 g/dL Final  . Albumin 01/27/2016 3.4* 3.6 - 5.1 g/dL Final  . Magnesium 01/27/2016 1.8  1.5 - 2.5 mg/dL Final  . TSH 01/27/2016 0.82   Final   Comment:   Reference Range   > or = 20 Years  0.40-4.50   Pregnancy Range First trimester  0.26-2.66 Second trimester 0.55-2.73 Third trimester  0.43-2.91     . Vit D, 25-Hydroxy 01/27/2016 42  30 - 100 ng/mL Final   Comment: Vitamin D Status           25-OH Vitamin D        Deficiency                <20 ng/mL        Insufficiency         20 - 29 ng/mL        Optimal             > or = 30 ng/mL   For 25-OH Vitamin D testing on patients on D2-supplementation and patients for whom quantitation of D2 and D3 fractions is  required, the QuestAssureD 25-OH VIT D, (D2,D3), LC/MS/MS is recommended: order code [id number] (patients > 2 yrs).   . Hgb A1c MFr Bld 01/27/2016 6.1* <5.7 % Final   Comment:   For someone without known diabetes, a hemoglobin A1c value between 5.7% and 6.4% is consistent with prediabetes and should be confirmed with a follow-up test.   For someone with known diabetes, a value <7% indicates that their diabetes is well controlled. A1c targets should be individualized based on duration of diabetes, age, co-morbid conditions and other considerations.   This assay result is consistent with an increased risk of diabetes.   Currently, no consensus exists regarding use of hemoglobin A1c for diagnosis of diabetes in children.     . Mean Plasma Glucose 01/27/2016 128   Final  . Cholesterol 01/27/2016 227* 125 - 200 mg/dL Final  . Triglycerides 01/27/2016 200* <150 mg/dL Final  . HDL 01/27/2016 48  >=46 mg/dL Final  . Total CHOL/HDL Ratio 01/27/2016 4.7  <=5.0 Ratio Final  . VLDL 01/27/2016 40* <30 mg/dL Final  . LDL Cholesterol 01/27/2016 139* <130 mg/dL Final   Comment:   Total Cholesterol/HDL Ratio:CHD Risk                        Coronary Heart  Disease Risk Table                                        Men       Women          1/2 Average Risk              3.4        3.3              Average Risk              5.0        4.4           2X Average Risk              9.6        7.1           3X Average Risk             23.4       11.0 Use the calculated Patient Ratio above and the CHD Risk table  to determine the patient's CHD Risk.   . Insulin 01/27/2016 8.5  2.0 - 19.6 uIU/mL Final   Comment: ** Please note change in reference range(s). **   This insulin assay shows strong cross-reactivity for some insulin analogs (lispro, aspart, and glargine) and much lower cross-reactivity with others (detemir, glulisine).     RADIOGRAPHIC STUDIES: No results found.  ASSESSMENT/PLAN:    Breast cancer of upper-outer quadrant of right female breast [facility]) Patient presents to the [facility] today to receive cycle 3 of her dose dense AC chemotherapy regimen.  She states that she is feeling fairly well.  She denies any recent fevers or chills.  Patient is requesting a refill of both her dexamethasone and Ativan today.  Patient states that she saw her primary care physician Dr. Melford Aase just yesterday; had a battery of blood test done at that time.  Labs obtained just yesterday per her primary care physician reveal a WBC of 8.8, ANC 3.9, hemoglobin 11.5, and platelet count 181.  Vitals  obtained today were all within normal limits; with the exception of the mild hypotension at 97/62.  Of note-patient takes 3 different medications that can lower her blood pressure.:  Maxzide, atenolol, and Cardizem.  Advised patient to recheck her blood pressure when she arrives;; let us know if it remains low.  May need to consider adjusting patient's blood pressure medications if blood pressure remains low.  Patient will proceed today with cycle 3 of her chemotherapy as planned.  She will receive a Neulasta injection following her chemotherapy.  Patient is  scheduled for restaging CT of her chest on 01/30/2016.  She is scheduled for labs, flush, visit, and chemotherapy on 02/11/2016.  Skin ulcer of right thigh, limited to breakdown of skin [facility]) Patient states that she has some irritation to her bilateral inner thighs where her legs are rubbing.  She states that one area has rubbed to the point of skin breakdown.  This area is tender.  Exam today reveals some erythema to bilateral upper inner thighs near the groin.  She also has a 1 cm in diameter open lesion to the right inner thigh.  There is no evidence of edema, warmth, or red streaks.  Advised patient that most likely this is due to her legs rubbing together.  Obtained a culture of the right inner thigh lesion.  Advised patient to try keep the area as dry as possible.  Suggested that patient try wearing cotton gym shorts or placing a dressing to the sites to prevent rubbing.  Advised patient would prescribe Bactrim antibiotics prophylactically to prevent any skin infections.  Patient was advised to let us know if inner thigh irritation does not improve.   Patient stated understanding of all instructions; and was in agreement with this plan of care. The patient knows to call the clinic with any problems, questions or concerns.   Total time spent with patient was 25 minutes;  with greater than 75 percent of that time spent in face to face counseling regarding patient's symptoms,  and coordination of care and follow up.  Disclaimer:This dictation was prepared with Dragon/digital dictation along with [employer]. Any transcriptional errors that result from this process are unintentional.  Drue Second, NP 01/30/2016

## 2016-01-30 NOTE — Telephone Encounter (Signed)
This RN called patient as a follow up from this week. Patient stated,"I do not need fluids. I am eating and drinking plenty. I'm checking my blood pressure, everything is all right. Thank you for calling."

## 2016-01-30 NOTE — Assessment & Plan Note (Signed)
Patient presents to the [facility] today to receive cycle 3 of her dose dense AC chemotherapy regimen.  She states that she is feeling fairly well.  She denies any recent fevers or chills.  Patient is requesting a refill of both her dexamethasone and Ativan today.  Patient states that she saw her primary care physician Dr. Melford Aase just yesterday; had a battery of blood test done at that time.  Labs obtained just yesterday per her primary care physician reveal a WBC of 8.8, ANC 3.9, hemoglobin 11.5, and platelet count 181.  Vitals obtained today were all within normal limits; with the exception of the mild hypotension at 97/62.  Of note-patient takes 3 different medications that can lower her blood pressure.:  Maxzide, atenolol, and Cardizem.  Advised patient to recheck her blood pressure when she arrives;; let us know if it remains low.  May need to consider adjusting patient's blood pressure medications if blood pressure remains low.  Patient will proceed today with cycle 3 of her chemotherapy as planned.  She will receive a Neulasta injection following her chemotherapy.  Patient is scheduled for restaging CT of her chest on 01/30/2016.  She is scheduled for labs, flush, visit, and chemotherapy on 02/11/2016.

## 2016-02-02 LAB — WOUND CULTURE: ORGANISM ID, BACTERIA: NONE SEEN

## 2016-02-03 ENCOUNTER — Encounter: Payer: Self-pay | Admitting: Cardiology

## 2016-02-03 ENCOUNTER — Ambulatory Visit (INDEPENDENT_AMBULATORY_CARE_PROVIDER_SITE_OTHER): Payer: BC Managed Care – PPO | Admitting: Cardiology

## 2016-02-03 VITALS — BP 122/64 | HR 94 | Ht 62.0 in | Wt 175.0 lb

## 2016-02-03 DIAGNOSIS — I480 Paroxysmal atrial fibrillation: Secondary | ICD-10-CM

## 2016-02-03 DIAGNOSIS — I517 Cardiomegaly: Secondary | ICD-10-CM | POA: Diagnosis not present

## 2016-02-03 DIAGNOSIS — I10 Essential (primary) hypertension: Secondary | ICD-10-CM | POA: Diagnosis not present

## 2016-02-03 DIAGNOSIS — C50411 Malignant neoplasm of upper-outer quadrant of right female breast: Secondary | ICD-10-CM | POA: Diagnosis not present

## 2016-02-03 NOTE — Patient Instructions (Signed)
Medication Instructions:   Your physician recommends that you continue on your current medications as directed. Please refer to the Current Medication list given to you today.     Testing/Procedures:  Your physician has requested that you have a cardiac MRI. Cardiac MRI uses a computer to create images of your heart as its beating, producing both still and moving pictures of your heart and major blood vessels. For further information please visit [email or web address] Please follow the instruction sheet given to you today for more information.  PER DR NELSON THIS SHOULD BE SCHEDULED FOR THE END OF MAY 2017, FOR THE PATIENT WILL NOT BE ABLE TO HAVE THIS DONE UNTIL THEN, FOR SHE WILL BE HAVE SURGERY PRIOR TO THAT.  SCHEDULE THIS FOR DR Meda Coffee TO READ ON HER READER B DAY    Follow-Up:  Your physician wants you to follow-up in: [place] will receive a reminder letter in the mail two months in advance. If you don't receive a letter, please call our office to schedule the follow-up appointment.      If you need a refill on your cardiac medications before your next appointment, please call your pharmacy.

## 2016-02-03 NOTE — Progress Notes (Signed)
Patient ID: [name], female   DOB: 01-10-1956, 60 y.o.   MRN: [id number]    Cardiology Office Note   Date:  02/06/2016   ID:  [name], DOB [birth year], MRN [id number]  Patient Care Team: Unk Pinto, MD as PCP - General (Internal Medicine) Warden Fillers, MD as Consulting Physician (Ophthalmology) Kathie Rhodes, MD as Consulting Physician (Urology) Danella Sensing, MD as Consulting Physician (Dermatology) Alfonso Patten, RN as Registered Nurse Dorothy Spark, MD as Consulting Physician (Cardiology) Sylvan Cheese, NP as Nurse Practitioner (Hematology and Oncology)   Chief complain: PAF   History of Present Illness: [name] is a 60 y.o. female with a hx of HTN, HL, PAF, on chronic anticoagulation with Xarelto who was diagnosed breast ca - stage 2 in 11/2015.NO H/o CAD, negative Myoview in 12/15. Echo 12/26/15: EF 60-65% RV mild to moderately dilated with heavily trabecualted apex. RV function normal. Grade II DD. Lat s' 11.2 cm/s GLS -23.9% She was seen by Dr Haroldine Laws in cardio-oncology clinic prior to starting Herceptin.  She had normal LVEF 60-65% and GLS -23.9%. She was started on Herceptin on 01/01/16.  CMR also showed dilated hyper-trabeculated RV.   She feels ok, tired from chemotherapy, but denies orthopnea, PND, LE edema, palpitations, chest pain, SOB. She is complaint with her meds, continues taking Xarelto with no bleeding.   Studies/Reports Reviewed Today:  Echo 07/10/15 EF 60-65%, no RWMA  Myoview 10/08/14 Normal stress nuclear study.  LV Ejection Fraction: 61%. LV Wall Motion: NL LV Function; NL Wall Motion   Past Medical History  Diagnosis Date  . MRSA (methicillin resistant staph aureus) culture positive 2009     none since per patient  . Eczema   . Hypercholesteremia   . Hypertension   . Kidney stones   . Anemia   . Vitamin D deficiency   . Paroxysmal atrial fibrillation ([place]) 10/2011, 06/2015  . GERD  (gastroesophageal reflux disease)   . Atrial fibrillation ([place]) 2016  . Dysrhythmia     A FIB - followed by Dr. Meda Coffee  . Breast cancer of upper-outer quadrant of right female breast ([place]) 12/12/2015  . Breast cancer ([place])   . Complication of anesthesia 10-06-15     slow to awaken after   . PONV (postoperative nausea and vomiting) 10-06-15    admitted back to hospital for dehydration    Past Surgical History  Procedure Laterality Date  . Breast surgery Left     biopsy-neg  . Kidney stone surgery  2013  . Nephrolithotomy Right 10/06/2015    Procedure: NEPHROLITHOTOMY PERCUTANEOUS;  Surgeon: Kathie Rhodes, MD;  Location: [facility];  Service: Urology;  Laterality: Right;  . Cystoscopy with holmium laser lithotripsy Right 10/06/2015    Procedure: CYSTOSCOPY WITH HOLMIUM LASER LITHOTRIPSY;  Surgeon: Kathie Rhodes, MD;  Location: [facility];  Service: Urology;  Laterality: Right;  . Portacath placement Left 12/23/2015    Procedure: INSERTION PORT-A-CATH WITH [place];  Surgeon: Stark Klein, MD;  Location: [facility];  Service: General;  Laterality: Left;     Current Outpatient Prescriptions  Medication Sig Dispense Refill  . atenolol (TENORMIN) 100 MG tablet Take 100 mg by mouth daily.    . cholecalciferol (VITAMIN D) 1000 UNITS tablet Take 4,000 Units by mouth daily.    Marland Kitchen dexamethasone (DECADRON) 4 MG tablet Take 1 tablet (4 mg total) by mouth See admin instructions. Take 1 tablets by mouth once a day on the day after chemotherapy and then take  1 tablets two times a day for 2 days. Take with  food. Chemotherapy on Thursdays. 30 tablet 1  . diazepam (VALIUM) 5 MG tablet Take 5 mg by mouth daily.    Marland Kitchen diltiazem (CARDIZEM CD) 120 MG 24 hr capsule Take 1 capsule (120 mg total) by mouth daily. 90 capsule 3  . doxepin (SINEQUAN) 50 MG capsule take 1 capsule by mouth once daily at bedtime 30 capsule 1  . ferrous sulfate 325 (65 FE) MG tablet Take 325 mg by mouth daily with breakfast.    .  HYDROcodone-acetaminophen (NORCO/VICODIN) 5-325 MG tablet Take 1-2 tablets by mouth every 4 (four) hours as needed for moderate pain. 30 tablet 0  . LORazepam (ATIVAN) 0.5 MG tablet Take 1 tablet (0.5 mg total) by mouth every 6 (six) hours as needed (nausea). 30 tablet 0  . montelukast (SINGULAIR) 10 MG tablet Take 1 tablet (10 mg total) by mouth daily. (Patient taking differently: Take 10 mg by mouth daily as needed (allergies). ) 30 tablet 2  . ondansetron (ZOFRAN) 8 MG tablet Take 1 tablet (8 mg total) by mouth 2 (two) times daily as needed. Start on the third day after chemotherapy. 30 tablet 1  . oxyCODONE-acetaminophen (PERCOCET/ROXICET) 5-325 MG tablet     . PAZEO 0.7 % SOLN Place 1 drop into both eyes daily as needed (allergies).     . potassium chloride SA (K-DUR,KLOR-CON) 20 MEQ tablet Take 20 mEq by mouth 2 (two) times daily.    . prochlorperazine (COMPAZINE) 10 MG tablet Take 1 tablet (10 mg total) by mouth every 6 (six) hours as needed (Nausea or vomiting). 30 tablet 1  . rivaroxaban (XARELTO) 20 MG TABS tablet Take 1 tablet (20 mg total) by mouth daily with supper. 90 tablet 3  . sulfamethoxazole-trimethoprim (BACTRIM DS,SEPTRA DS) 800-160 MG tablet Take 1 tablet by mouth 2 (two) times daily. 20 tablet 0  . triamcinolone cream (KENALOG) 0.1 % Apply 1 application topically 2 (two) times daily.    Marland Kitchen triamterene-hydrochlorothiazide (MAXZIDE-25) 37.5-25 MG tablet Take 1 tablet by mouth daily. 30 tablet 3   No current facility-administered medications for this visit.    Allergies:   Penicillins    Social History:   Social History   Social History  . Marital Status: Widowed    Spouse Name: N/A  . Number of Children: N/A  . Years of Education: N/A   Social History Main Topics  . Smoking status: Never Smoker   . Smokeless tobacco: Never Used  . Alcohol Use: No  . Drug Use: No  . Sexual Activity: Not Asked   Other Topics Concern  . None   Social History Narrative      Family History:   Family History  Problem Relation Age of Onset  . Hypertension Mother   . Hepatitis C Mother   . Heart disease Father   . Dementia Father   . Aneurysm Brother       ROS:   Please see the history of present illness.   Review of Systems  HENT: Positive for headaches.   Cardiovascular: Positive for dyspnea on exertion and leg swelling.  Hematologic/Lymphatic: Bruises/bleeds easily.  Musculoskeletal: Positive for back pain and joint pain.  Gastrointestinal: Positive for abdominal pain.  Genitourinary: Positive for hematuria.  All other systems reviewed and are negative.     PHYSICAL EXAM: VS:  BP 122/64 mmHg  Pulse 94  Ht '5\' 2"'$  (1.575 m)  Wt 175 lb (79.379 kg)  BMI 32.00  kg/m2  SpO2 99%    Wt Readings from Last 3 Encounters:  02/03/16 175 lb (79.379 kg)  01/28/16 178 lb 12.8 oz (81.103 kg)  01/27/16 177 lb 3.2 oz (80.377 kg)     GEN: Well nourished, well developed, in no acute distress HEENT: normal Neck: no JVD,  no masses Cardiac:  Normal S1/S2, RRR; no murmur ,  no rubs or gallops, no edema   Respiratory:  clear to auscultation bilaterally, no wheezing, rhonchi or rales. GI: soft, nontender, nondistended, + BS MS: no deformity or atrophy Skin: warm and dry  Neuro:  CNs II-XII intact, Strength and sensation are intact Psych: Normal affect   EKG:  EKG is not ordered today.  It demonstrates:   n/a   Recent Labs: 01/27/2016: ALT 14; BUN 7; Creat 0.56; Hemoglobin 11.5*; Magnesium 1.8; Platelets 181; Potassium 3.8; Sodium 139; TSH 0.82    Lipid Panel    Component Value Date/Time   CHOL 227* 01/27/2016 0953   TRIG 200* 01/27/2016 0953   HDL 48 01/27/2016 0953   CHOLHDL 4.7 01/27/2016 0953   VLDL 40* 01/27/2016 0953   LDLCALC 139* 01/27/2016 0953      ASSESSMENT AND PLAN:  1. PAF:  Maintaining NSR. Restarted Xarelto, hematuria resolved, stable Hb 11.5 on 01/27/2016. Continue diltiazem CD 120 mg po daily.   2. Chemotherapy with  Herceptin - followed in cardio-oncology clinic, normal LVEF, and strain parameters in 12/2014, follow up TTE in 3 months.  3. Dilated and trabeculated RV - we will plan for a CMR to further evaluate, the patient is currently undergoing chemo and is scheduled for a breast surgery in the next 2 weeks, we will plan for CMR post surgery. PFTs are also scheduled.   4. HTN:  Controlled.   Follow up in 6 months.  Signed, Dorothy Spark, MD 02/06/2016 12:07 AM    [address], [place], [place]  [address] Phone: [phone]; Fax: [phone]       CARDIO-ONCOLOGY CLINIC CONSULT NOTE  Referring Physician:   HPI:  [name] is a 60 y/o woman with HTN, HL, PAF, severe eczema, kidney stones and right breast CA (diagnosed 2/17) who is referred by Dr. 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 RV function normal. Grade II DD. Lat s' 11.2 cm/s GLS -23.9%  She is hear with her daughter. She has followed with Dr. Meda Coffee in Cardiology for AF. She does not have known CAD. She had negative Myoview in 12/15. Denies h/o tobacco use.  Denies CP, orthopnea, or PND. Does get out of breath quickly. Recovering from cold. Starts herceptin 01/01/16. Snores mildly.   Review of Systems: [y] = yes, '[ ]'$  = no   General: Weight gain '[ ]'$ ; Weight loss '[ ]'$ ; Anorexia '[ ]'$ ;  Fatigue '[ ]'$ ; Fever '[ ]'$ ; Chills '[ ]'$ ; Weakness '[ ]'$   Cardiac: Chest pain/pressure '[ ]'$ ;  Resting SOB '[ ]'$ ; Exertional SOB Blue.Reese ]; Orthopnea '[ ]'$ ; Pedal Edema '[ ]'$ ; Palpitations '[ ]'$ ; Syncope '[ ]'$ ; Presyncope '[ ]'$ ; Paroxysmal nocturnal dyspnea'[ ]'$   Pulmonary: Cough '[ ]'$ ; Wheezing'[ ]'$ ; Hemoptysis'[ ]'$ ; Sputum '[ ]'$ ; Snoring Blue.Reese ]  GI: Vomiting'[ ]'$ ; Dysphagia'[ ]'$ ; Melena'[ ]'$ ; Hematochezia '[ ]'$ ; Heartburn'[ ]'$ ; Abdominal pain '[ ]'$ ; Constipation '[ ]'$ ; Diarrhea '[ ]'$ ; BRBPR '[ ]'$   GU: Hematuria'[ ]'$ ; Dysuria '[ ]'$ ; Nocturia'[ ]'$   Vascular: Pain in legs with walking '[ ]'$ ; Pain in feet with lying flat '[ ]'$ ; Non-healing sores '[ ]'$ ; Stroke '[ ]'$ ; TIA '[ ]'$ ; Slurred speech '[ ]'$ ;  Neuro: Headaches'[ ]'$ ; Vertigo'[ ]'$ ; Seizures'[ ]'$ ; Paresthesias'[ ]'$ ;Blurred vision '[ ]'$ ; Diplopia '[ ]'$ ; Vision changes '[ ]'$   Ortho/Skin: Arthritis Blue.Reese ]; Joint pain [ y]; Muscle pain '[ ]'$ ; Joint swelling '[ ]'$ ; Back Pain '[ ]'$ ; Rash [y]  Psych: Depression'[ ]'$ ; Anxiety'[ ]'$   Heme: Bleeding problems '[ ]'$ ; Clotting disorders '[ ]'$ ; Anemia '[ ]'$   Endocrine: Diabetes '[ ]'$ ; Thyroid dysfunction'[ ]'$    Past Medical History  Diagnosis Date  . MRSA (methicillin resistant staph aureus) culture positive 2009     none since per patient  . Eczema   . Hypercholesteremia   . Hypertension   . Kidney stones   . Anemia   . Vitamin D deficiency   . Paroxysmal atrial fibrillation ([place]) 10/2011, 06/2015  . GERD (gastroesophageal reflux disease)   . Atrial fibrillation ([place]) 2016  . Dysrhythmia     A FIB - followed by Dr. Meda Coffee  . Breast cancer of upper-outer quadrant of right female breast ([place]) 12/12/2015  . Breast cancer ([place])   . Complication of anesthesia 10-06-15     slow to awaken after   . PONV (postoperative nausea and vomiting) 10-06-15    admitted back to hospital for dehydration    Current Outpatient Prescriptions  Medication Sig Dispense Refill  . atenolol (TENORMIN) 100 MG tablet Take 100 mg by mouth daily.    . cholecalciferol (VITAMIN D) 1000 UNITS tablet Take 4,000 Units by mouth daily.     Marland Kitchen dexamethasone (DECADRON) 4 MG tablet Take 1 tablet (4 mg total) by mouth See admin instructions. Take 1 tablets by mouth once a day on the day after chemotherapy and then take 1 tablets two times a day for 2 days. Take with  food. Chemotherapy on Thursdays. 30 tablet 1  . diazepam (VALIUM) 5 MG tablet Take 5 mg by mouth daily.    Marland Kitchen diltiazem (CARDIZEM CD) 120 MG 24 hr capsule Take 1 capsule (120 mg total) by mouth daily. 90 capsule 3  . doxepin (SINEQUAN) 50 MG capsule take 1 capsule by mouth once daily at bedtime 30 capsule 1  . ferrous sulfate 325 (65 FE) MG tablet Take 325 mg by mouth daily with breakfast.    . HYDROcodone-acetaminophen (NORCO/VICODIN) 5-325 MG tablet Take 1-2 tablets by mouth every 4 (four) hours as needed for moderate pain. 30 tablet 0  . LORazepam (ATIVAN) 0.5 MG tablet Take 1 tablet (0.5 mg total) by mouth every 6 (six) hours as needed (nausea). 30 tablet 0  . montelukast (SINGULAIR) 10 MG tablet Take 1 tablet (10 mg total) by mouth daily. (Patient taking differently: Take 10 mg by mouth daily as needed (allergies). ) 30 tablet 2  . ondansetron (ZOFRAN) 8 MG tablet Take 1 tablet (8 mg total) by mouth 2 (two) times daily as needed. Start on the third day after chemotherapy. 30 tablet  1  . oxyCODONE-acetaminophen (PERCOCET/ROXICET) 5-325 MG tablet     . PAZEO 0.7 % SOLN Place 1 drop into both eyes daily as needed (allergies).     . potassium chloride SA (K-DUR,KLOR-CON) 20 MEQ tablet Take 20 mEq by mouth 2 (two) times daily.    . prochlorperazine (COMPAZINE) 10 MG tablet Take 1 tablet (10 mg total) by mouth every 6 (six) hours as needed (Nausea or vomiting). 30 tablet 1  . rivaroxaban (XARELTO) 20 MG TABS tablet Take 1 tablet (20 mg total) by mouth daily with supper. 90 tablet 3  . sulfamethoxazole-trimethoprim (BACTRIM DS,SEPTRA DS) 800-160 MG tablet Take 1 tablet by mouth 2 (two) times daily. 20 tablet 0  . triamcinolone cream (KENALOG) 0.1 % Apply 1 application topically  2 (two) times daily.    Marland Kitchen triamterene-hydrochlorothiazide (MAXZIDE-25) 37.5-25 MG tablet Take 1 tablet by mouth daily. 30 tablet 3   No current facility-administered medications for this visit.    Allergies  Allergen Reactions  . Penicillins Other (See Comments)           Social History   Social History  . Marital Status: Widowed    Spouse Name: N/A  . Number of Children: N/A  . Years of Education: N/A   Occupational History  . Not on file.   Social History Main Topics  . Smoking status: Never Smoker   . Smokeless tobacco: Never Used  . Alcohol Use: No  . Drug Use: No  . Sexual Activity: Not on file   Other Topics Concern  . Not on file   Social History Narrative      Family History  Problem Relation Age of Onset  . Hypertension Mother   . Hepatitis C Mother   . Heart disease Father   . Dementia Father   . Aneurysm Brother     Filed Vitals:   02/03/16 1120  BP: 122/64  Pulse: 94  Height: '5\' 2"'$  (1.575 m)  Weight: 175 lb (79.379 kg)  SpO2: 99%    PHYSICAL EXAM: General:  Well appearing. No respiratory difficulty HEENT: normal Neck: supple. no JVD. Carotids 2+ bilat; no bruits. No lymphadenopathy or thryomegaly appreciated. Cor: PMI nondisplaced. Regular rate & rhythm. No rubs, gallops or murmurs. Left chest port a cath Lungs: clear but decreased BS Abdomen: soft, nontender, nondistended. No hepatosplenomegaly. No bruits or masses. Good bowel sounds. Extremities: no cyanosis, clubbing, rash, edema. + diffuse eczema Neuro: alert & oriented x 3, cranial nerves grossly intact. moves all 4 extremities w/o difficulty. Affect pleasant.  TTE: 12/26/2015 - Left ventricle: The cavity size was normal. Wall thickness was  normal. Systolic function was normal. The estimated ejection  fraction was in the range of 60% to 65%. Wall motion was normal;  there were no regional wall motion abnormalities. Features are  consistent with a pseudonormal left  ventricular filling pattern,  with concomitant abnormal relaxation and increased filling  pressure (grade 2 diastolic dysfunction). - Left atrium: The atrium was mildly to moderately dilated. - Right ventricle: The cavity size was mildly to moderately  dilated. The RV apex appears heavily trabeculated. - Right atrium: The atrium was moderately dilated. - Impressions: Lateral s&' 11.2 cm/s. GLS -23.9%. THe RV is mild to  moderately dilated with a heavily trabeculated apex. Consider  cMRI to further evaluate.  Impressions:  - Lateral s&' 11.2 cm/s. GLS -23.9%. THe RV is mild to moderately  dilated with a heavily trabeculated apex. Consider cMRI to  further evaluate.  ASSESSMENT &  PLAN: 1. Breast cancer ER/PR - HER-2 + -Explained incidence of Herceptin cardiotoxicity and role of Cardio-oncology clinic at length. Echo images reviewed personally. All parameters stable. Reviewed signs and symptoms of HF to look for. OK to start Herceptin. Follow-up with echo in 3 months. 2. PAF on Xarelto 3. Dilated RV on echo - will get cMRI and PFTs to further evalaute 4. Severe eczema 5. Dyspnea on exertion - see above  Ena Dawley H,MD 11:33 AM

## 2016-02-05 ENCOUNTER — Encounter: Payer: Self-pay | Admitting: Hematology and Oncology

## 2016-02-05 ENCOUNTER — Encounter: Payer: Self-pay | Admitting: Cardiology

## 2016-02-05 NOTE — Progress Notes (Signed)
left in pod- left for dr. Lindi Adie to sign and will call patient once done

## 2016-02-06 ENCOUNTER — Encounter: Payer: Self-pay | Admitting: Hematology and Oncology

## 2016-02-06 NOTE — Progress Notes (Signed)
called [phone] and no answer.form ready for pck with ms wilma

## 2016-02-10 ENCOUNTER — Other Ambulatory Visit: Payer: Self-pay | Admitting: Internal Medicine

## 2016-02-10 ENCOUNTER — Encounter: Payer: Self-pay | Admitting: Internal Medicine

## 2016-02-10 ENCOUNTER — Other Ambulatory Visit: Payer: Self-pay

## 2016-02-10 ENCOUNTER — Other Ambulatory Visit: Payer: Self-pay | Admitting: Physician Assistant

## 2016-02-10 DIAGNOSIS — Z95828 Presence of other vascular implants and grafts: Secondary | ICD-10-CM | POA: Insufficient documentation

## 2016-02-10 NOTE — Telephone Encounter (Signed)
Rx called into [facility]. 

## 2016-02-11 ENCOUNTER — Ambulatory Visit (HOSPITAL_BASED_OUTPATIENT_CLINIC_OR_DEPARTMENT_OTHER): Payer: BC Managed Care – PPO | Admitting: Hematology and Oncology

## 2016-02-11 ENCOUNTER — Encounter: Payer: Self-pay | Admitting: Hematology and Oncology

## 2016-02-11 ENCOUNTER — Telehealth: Payer: Self-pay | Admitting: *Deleted

## 2016-02-11 ENCOUNTER — Ambulatory Visit (HOSPITAL_BASED_OUTPATIENT_CLINIC_OR_DEPARTMENT_OTHER): Payer: BC Managed Care – PPO

## 2016-02-11 ENCOUNTER — Telehealth: Payer: Self-pay | Admitting: Hematology and Oncology

## 2016-02-11 ENCOUNTER — Other Ambulatory Visit (HOSPITAL_BASED_OUTPATIENT_CLINIC_OR_DEPARTMENT_OTHER): Payer: BC Managed Care – PPO

## 2016-02-11 ENCOUNTER — Ambulatory Visit: Payer: BC Managed Care – PPO

## 2016-02-11 VITALS — BP 136/97 | HR 87 | Temp 98.3°F | Resp 18 | Wt 173.1 lb

## 2016-02-11 DIAGNOSIS — C773 Secondary and unspecified malignant neoplasm of axilla and upper limb lymph nodes: Secondary | ICD-10-CM

## 2016-02-11 DIAGNOSIS — C50411 Malignant neoplasm of upper-outer quadrant of right female breast: Secondary | ICD-10-CM

## 2016-02-11 DIAGNOSIS — Z5111 Encounter for antineoplastic chemotherapy: Secondary | ICD-10-CM | POA: Diagnosis not present

## 2016-02-11 DIAGNOSIS — D6959 Other secondary thrombocytopenia: Secondary | ICD-10-CM | POA: Diagnosis not present

## 2016-02-11 DIAGNOSIS — L97109 Non-pressure chronic ulcer of unspecified thigh with unspecified severity: Secondary | ICD-10-CM

## 2016-02-11 DIAGNOSIS — L97111 Non-pressure chronic ulcer of right thigh limited to breakdown of skin: Secondary | ICD-10-CM

## 2016-02-11 DIAGNOSIS — D701 Agranulocytosis secondary to cancer chemotherapy: Secondary | ICD-10-CM | POA: Diagnosis not present

## 2016-02-11 DIAGNOSIS — G4700 Insomnia, unspecified: Secondary | ICD-10-CM

## 2016-02-11 LAB — CBC WITH DIFFERENTIAL/PLATELET
BASO%: 1.6 % (ref 0.0–2.0)
BASOS ABS: 0.1 10*3/uL (ref 0.0–0.1)
EOS%: 0 % (ref 0.0–7.0)
Eosinophils Absolute: 0 10*3/uL (ref 0.0–0.5)
HEMATOCRIT: 35.4 % (ref 34.8–46.6)
HEMOGLOBIN: 11.7 g/dL (ref 11.6–15.9)
LYMPH#: 1.8 10*3/uL (ref 0.9–3.3)
LYMPH%: 29.5 % (ref 14.0–49.7)
MCH: 29 pg (ref 25.1–34.0)
MCHC: 33.1 g/dL (ref 31.5–36.0)
MCV: 87.8 fL (ref 79.5–101.0)
MONO#: 0.5 10*3/uL (ref 0.1–0.9)
MONO%: 8.9 % (ref 0.0–14.0)
NEUT#: 3.6 10*3/uL (ref 1.5–6.5)
NEUT%: 60 % (ref 38.4–76.8)
Platelets: 269 10*3/uL (ref 145–400)
RBC: 4.03 10*6/uL (ref 3.70–5.45)
RDW: 18.5 % — AB (ref 11.2–14.5)
WBC: 6.1 10*3/uL (ref 3.9–10.3)

## 2016-02-11 LAB — COMPREHENSIVE METABOLIC PANEL
ALBUMIN: 3.8 g/dL (ref 3.5–5.0)
ALK PHOS: 52 U/L (ref 40–150)
ALT: 33 U/L (ref 0–55)
AST: 16 U/L (ref 5–34)
Anion Gap: 11 mEq/L (ref 3–11)
BILIRUBIN TOTAL: 0.38 mg/dL (ref 0.20–1.20)
BUN: 16 mg/dL (ref 7.0–26.0)
CALCIUM: 9.7 mg/dL (ref 8.4–10.4)
CO2: 23 mEq/L (ref 22–29)
CREATININE: 0.8 mg/dL (ref 0.6–1.1)
Chloride: 105 mEq/L (ref 98–109)
EGFR: >90 mL/min/{1.73_m2} (ref >90)
Glucose: 95 mg/dl (ref 70–140)
POTASSIUM: 3.9 meq/L (ref 3.5–5.1)
Sodium: 140 mEq/L (ref 136–145)
TOTAL PROTEIN: 6.3 g/dL — AB (ref 6.4–8.3)

## 2016-02-11 MED ORDER — DOXORUBICIN HCL CHEMO IV INJECTION 2 MG/ML
50.0000 mg/m2 | Freq: Once | INTRAVENOUS | Status: AC
Start: 2016-02-11 — End: 2016-02-11
  Administered 2016-02-11: 94 mg via INTRAVENOUS
  Filled 2016-02-11: qty 47

## 2016-02-11 MED ORDER — PEGFILGRASTIM 6 MG/0.6ML ~~LOC~~ PSKT
6.0000 mg | PREFILLED_SYRINGE | Freq: Once | SUBCUTANEOUS | Status: AC
  Administered 2016-02-11: 6 mg via SUBCUTANEOUS
  Filled 2016-02-11: qty 0.6

## 2016-02-11 MED ORDER — HEPARIN SOD (PORK) LOCK FLUSH 100 UNIT/ML IV SOLN
500.0000 [IU] | Freq: Once | INTRAVENOUS | Status: AC | PRN
  Administered 2016-02-11: 500 [IU]
  Filled 2016-02-11: qty 5

## 2016-02-11 MED ORDER — SODIUM CHLORIDE 0.9 % IV SOLN
Freq: Once | INTRAVENOUS | Status: AC
  Administered 2016-02-11: 10:00:00 via INTRAVENOUS
  Filled 2016-02-11: qty 5

## 2016-02-11 MED ORDER — SODIUM CHLORIDE 0.9 % IV SOLN
Freq: Once | INTRAVENOUS | Status: AC
  Administered 2016-02-11: 09:00:00 via INTRAVENOUS

## 2016-02-11 MED ORDER — SODIUM CHLORIDE 0.9 % IV SOLN
500.0000 mg/m2 | Freq: Once | INTRAVENOUS | Status: AC
  Administered 2016-02-11: 940 mg via INTRAVENOUS
  Filled 2016-02-11: qty 47

## 2016-02-11 MED ORDER — LORAZEPAM 0.5 MG PO TABS: 0.5000 mg | ORAL_TABLET | Freq: Four times a day (QID) | ORAL | Status: DC | PRN

## 2016-02-11 MED ORDER — PALONOSETRON HCL INJECTION 0.25 MG/5ML
INTRAVENOUS | Status: AC
  Filled 2016-02-11: qty 5

## 2016-02-11 MED ORDER — SODIUM CHLORIDE 0.9 % IJ SOLN
10.0000 mL | INTRAMUSCULAR | Status: DC | PRN
  Administered 2016-02-11: 10 mL via INTRAVENOUS
  Filled 2016-02-11: qty 10

## 2016-02-11 MED ORDER — SODIUM CHLORIDE 0.9% FLUSH
10.0000 mL | INTRAVENOUS | Status: DC | PRN
  Administered 2016-02-11: 10 mL
  Filled 2016-02-11: qty 10

## 2016-02-11 MED ORDER — PALONOSETRON HCL INJECTION 0.25 MG/5ML
0.2500 mg | Freq: Once | INTRAVENOUS | Status: AC
  Administered 2016-02-11: 0.25 mg via INTRAVENOUS

## 2016-02-11 MED ORDER — HYDROCODONE-ACETAMINOPHEN 5-325 MG PO TABS: 1.0000 | ORAL_TABLET | ORAL | Status: DC | PRN

## 2016-02-11 MED ORDER — PROCHLORPERAZINE MALEATE 10 MG PO TABS: 10.0000 mg | ORAL_TABLET | Freq: Four times a day (QID) | ORAL | Status: AC | PRN

## 2016-02-11 NOTE — Assessment & Plan Note (Signed)
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 Genetics consultation 2. Breast MRI 12/18/15::Rt Breast: 3.2 cm X 2.9x2.4 cm ring enh mass with central necrosis; 1.9 x 1.1 cm Rt Axill LN, Stable 3 cm left breast hamartoma 3. Neoadjuvant chemotherapy with dose dense Adriamycin Cytoxan 4 followed by Abraxane weekly 12 4. Followed by breast conserving surgery 5. Followed by adjuvant radiation therapy -------------------------------------------------------------------------------------------------------------------------- Current treatment: Cycle 4 day 1 Dose dense AC I also recommended LN biopsy ECHO 12/26/15: EF 60-65%  Chemotherapy toxicities: Denies any nausea or vomiting, denies any fatigue, denies any weakness, denies any fevers or chills. 1. Neutropenia: Expected for nadir count. I provided her with neutropenic precautions. I will not change chemotherapy dosage. 2. Mild thrombocytopenia: Platelets 100 3. Hospitalization for 01/20/2016 for abdominal pain, nausea, vomiting and diarrhea 4. Wound between her thighs: Klebsiella on culture treated with antibiotic  RTC in 2 weeks for cycle 1/12 Abraxane

## 2016-02-11 NOTE — Addendum Note (Signed)
Addended by: Prentiss Bells on: 02/11/2016 09:41 AM   Modules accepted: Orders

## 2016-02-11 NOTE — Telephone Encounter (Signed)
appt made and avs printed °

## 2016-02-11 NOTE — Progress Notes (Signed)
Unable to get in to exam room prior to MD.  No assessment performed.  

## 2016-02-11 NOTE — Patient Instructions (Signed)

## 2016-02-11 NOTE — Progress Notes (Signed)
Patient Care Team: Unk Pinto, MD as PCP - General (Internal Medicine) Warden Fillers, MD as Consulting Physician (Ophthalmology) Kathie Rhodes, MD as Consulting Physician (Urology) Danella Sensing, MD as Consulting Physician (Dermatology) Alfonso Patten, RN as Registered Nurse Dorothy Spark, MD as Consulting Physician (Cardiology) Sylvan Cheese, NP as Nurse Practitioner (Hematology and Oncology)  DIAGNOSIS: Breast cancer of upper-outer quadrant of right female breast [facility])   Staging form: Breast, AJCC 7th Edition     Clinical stage from 12/17/2015: Stage IIA (T2, N0, M0) - Unsigned       Staging comments: Staged at breast conference on 3.1.17    SUMMARY OF ONCOLOGIC HISTORY:   Breast cancer of upper-outer quadrant of right female breast ([place])   12/05/2015 Mammogram Right breast mammogram and ultrasound revealed 2.8 x 2.7 x 2.1 irregular hypoechoic mass at 10:00 position, few mildly prominent right extrarenal lymph nodes are identified but unchanged since 2008   12/10/2015 Initial Diagnosis Right breast biopsy 10:00 position: 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 Differential diagnosis complete replacement of lymph node versus direct extension   01/19/2016 - 01/22/2016 Hospital Admission Hospitalization for abdominal pain, diarrhea, nausea and vomiting    CHIEF COMPLIANT: Cycle 4 of dose dense Adriamycin and Cytoxan  INTERVAL HISTORY: [name] is a 60 year old with above-mentioned history right breast cancer currently  on neoadjuvant chemotherapy and today's cycle 4 of dose dense medicine Cytoxan. She was not hospital with nausea vomiting and diarrhea and was diagnosed with no rotavirus infection. She recovered from that but was found to have a sore between her thighs. The thigh was cultured and it came back as Klebsiella. She was put on Bactrim and had completed a course of antibiotic therapy. She has not noticed a third spot. The first 2 spot still are persistent. They're not getting significantly better. She is also not more tired and fatigued. She ran out of pain medications as well as nausea medications. She does complain of diffuse body aches and pains after chemotherapy which could be related to Neulasta.  REVIEW OF SYSTEMS:   Constitutional: Denies fevers, chills or abnormal weight loss, complains of fatigue Eyes: Denies blurriness of vision Ears, nose, mouth, throat, and face: Denies mucositis or sore throat Respiratory: Denies cough, dyspnea or wheezes Cardiovascular: Denies palpitation, chest discomfort Gastrointestinal:  Denies nausea, heartburn or change in bowel habits Skin: Denies abnormal skin rashes Lymphatics: Denies new lymphadenopathy or easy bruising Neurological:Denies numbness, tingling or new weaknesses Behavioral/Psych: Mood is stable, no new changes  Extremities: No lower extremity edema, sores between her thighs  All other systems were reviewed with the patient and are negative.  I have reviewed the past medical history, past surgical history, social history and family history with the patient and they are unchanged from previous note.  ALLERGIES:  is allergic to penicillins.  MEDICATIONS:  Current Outpatient Prescriptions  Medication Sig Dispense Refill  . atenolol (TENORMIN) 100 MG tablet take 1/2 to 1 tablet by mouth once daily as directed 90 tablet 1  . cholecalciferol (VITAMIN D)  1000 UNITS tablet Take 4,000 Units by mouth daily.    Marland Kitchen dexamethasone (DECADRON) 4 MG tablet  Take 1 tablet (4 mg total) by mouth See admin instructions. Take 1 tablets by mouth once a day on the day after chemotherapy and then take 1 tablets two times a day for 2 days. Take with  food. Chemotherapy on Thursdays. 30 tablet 1  . diazepam (VALIUM) 5 MG tablet Take 5 mg by mouth daily.    Marland Kitchen diltiazem (CARDIZEM CD) 120 MG 24 hr capsule Take 1 capsule (120 mg total) by mouth daily. 90 capsule 3  . doxepin (SINEQUAN) 50 MG capsule take 1 capsule by mouth once daily at bedtime 30 capsule 1  . ferrous sulfate 325 (65 FE) MG tablet Take 325 mg by mouth daily with breakfast.    . HYDROcodone-acetaminophen (NORCO/VICODIN) 5-325 MG tablet Take 1-2 tablets by mouth every 4 (four) hours as needed for moderate pain. 60 tablet 0  . LORazepam (ATIVAN) 0.5 MG tablet Take 1 tablet (0.5 mg total) by mouth every 6 (six) hours as needed (nausea). 30 tablet 0  . montelukast (SINGULAIR) 10 MG tablet Take 1 tablet (10 mg total) by mouth daily. (Patient taking differently: Take 10 mg by mouth daily as needed (allergies). ) 30 tablet 2  . ondansetron (ZOFRAN) 8 MG tablet Take 1 tablet (8 mg total) by mouth 2 (two) times daily as needed. Start on the third day after chemotherapy. 30 tablet 1  . oxyCODONE-acetaminophen (PERCOCET/ROXICET) 5-325 MG tablet     . PAZEO 0.7 % SOLN Place 1 drop into both eyes daily as needed (allergies).     . potassium chloride SA (K-DUR,KLOR-CON) 20 MEQ tablet Take 20 mEq by mouth 2 (two) times daily.    . prochlorperazine (COMPAZINE) 10 MG tablet Take 1 tablet (10 mg total) by mouth every 6 (six) hours as needed (Nausea or vomiting). 30 tablet 1  . rivaroxaban (XARELTO) 20 MG TABS tablet Take 1 tablet (20 mg total) by mouth daily with supper. 90 tablet 3  . sulfamethoxazole-trimethoprim (BACTRIM DS,SEPTRA DS) 800-160 MG tablet Take 1 tablet by mouth 2 (two) times daily. 20 tablet 0  . triamcinolone cream (KENALOG) 0.1 % Apply 1 application topically 2 (two) times daily.    Marland Kitchen  triamterene-hydrochlorothiazide (MAXZIDE-25) 37.5-25 MG tablet Take 1 tablet by mouth daily. 30 tablet 3   No current facility-administered medications for this visit.    PHYSICAL EXAMINATION: ECOG PERFORMANCE STATUS: 1 - Symptomatic but completely ambulatory  Filed Vitals:   02/11/16 0807  BP: 136/97  Pulse: 87  Temp: 98.3 F (36.8 C)  Resp: 18   Filed Weights   02/11/16 0807  Weight: 173 lb 1.6 oz (78.518 kg)    GENERAL:alert, no distress and comfortable SKIN: skin color, texture, turgor are normal, no rashes or significant lesions EYES: normal, Conjunctiva are pink and non-injected, sclera clear OROPHARYNX:no exudate, no erythema and lips, buccal mucosa, and tongue normal  NECK: supple, thyroid normal size, non-tender, without nodularity LYMPH:  no palpable lymphadenopathy in the cervical, axillary or inguinal LUNGS: clear to auscultation and percussion with normal breathing effort HEART: Atrial fibrillation ABDOMEN:abdomen soft, non-tender and normal bowel sounds MUSCULOSKELETAL:no cyanosis of digits and no clubbing  NEURO: alert & oriented x 3 with fluent speech, no focal motor/sensory deficits EXTREMITIES: No lower extremity edema Sores between her thighs has been bandaged  LABORATORY DATA:  I have reviewed the data as listed   Chemistry      Component Value  Date/Time   NA 140 02/11/2016 0743   NA 139 01/27/2016 0953   K 3.9 02/11/2016 0743   K 3.8 01/27/2016 0953   CL 101 01/27/2016 0953   CO2 23 02/11/2016 0743   CO2 26 01/27/2016 0953   BUN 16.0 02/11/2016 0743   BUN 7 01/27/2016 0953   CREATININE 0.8 02/11/2016 0743   CREATININE 0.56 01/27/2016 0953   CREATININE 0.46 01/22/2016 0537      Component Value Date/Time   CALCIUM 9.7 02/11/2016 0743   CALCIUM 8.8 01/27/2016 0953   ALKPHOS 52 02/11/2016 0743   ALKPHOS 61 01/27/2016 0953   AST 16 02/11/2016 0743   AST 13 01/27/2016 0953   ALT 33 02/11/2016 0743   ALT 14 01/27/2016 0953   BILITOT 0.38  02/11/2016 0743   BILITOT 0.4 01/27/2016 0953       Lab Results  Component Value Date   WBC 6.1 02/11/2016   HGB 11.7 02/11/2016   HCT 35.4 02/11/2016   MCV 87.8 02/11/2016   PLT 269 02/11/2016   NEUTROABS 3.6 02/11/2016     ASSESSMENT & PLAN:  Breast cancer of upper-outer quadrant of right female breast ([place]) Right breast biopsy 12/10/2015: 10:00 position: 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 Genetics consultation 2. Breast MRI 12/18/15::Rt Breast: 3.2 cm X 2.9x2.4 cm ring enh mass with central necrosis; 1.9 x 1.1 cm Rt Axill LN, Stable 3 cm left breast hamartoma 3. Neoadjuvant chemotherapy with dose dense Adriamycin Cytoxan 4 followed by Abraxane weekly 12 4. Followed by breast conserving surgery 5. Followed by adjuvant radiation therapy -------------------------------------------------------------------------------------------------------------------------- Current treatment: Cycle 4 day 1 Dose dense AC I also recommended LN biopsy ECHO 12/26/15: EF 60-65%  Chemotherapy toxicities: Denies any nausea or vomiting, denies any fatigue, denies any weakness, denies any fevers or chills. 1. Neutropenia: Expected for nadir count. I provided her with neutropenic precautions. I will not change chemotherapy dosage. 2. Mild thrombocytopenia: Platelets 100 3. Hospitalization for 01/20/2016 for abdominal pain, nausea, vomiting and diarrhea 4. Wound between her thighs: Klebsiella on culture treated with antibiotic, continues to be a problem. I will consult wound care to  evaluate her.  She would also require some grant money from [employer]. I gave her a prescription for a wig.  RTC in 2 weeks for cycle 1/12 Abraxane  No orders of the defined types were placed in this encounter.   The patient has a good understanding of the overall plan. she agrees with it. she will call with any problems that may develop before the next visit here.   Rulon Eisenmenger, MD 02/11/2016

## 2016-02-11 NOTE — Telephone Encounter (Signed)
"  There today expecting to see a wound specialist.  No one came to see me today.  I was in the hospital with [name] virus.  I now have open wounds in the crease of my groin and in inner thigh close to my private area.  I'm using ointment and gauze.  Can see white and red tissue." Notified her of referral order and to expect a call from a wound specialist.  Confirmed with collaborative.

## 2016-02-11 NOTE — Patient Instructions (Signed)
[facility] Discharge Instructions for Patients Receiving Chemotherapy  Today you received the following chemotherapy agents: Cytoxan, Adriamyocin.    To help prevent nausea and vomiting after your treatment, we encourage you to take your nausea medication: Zofran 8mg  twice a day as needed starting 02/14/16, Compazine 10mg  every 6 hrs as needed.    If you develop nausea and vomiting that is not controlled by your nausea medication, call the clinic.   BELOW ARE SYMPTOMS THAT SHOULD BE REPORTED IMMEDIATELY:  *FEVER GREATER THAN 100.5 F  *CHILLS WITH OR WITHOUT FEVER  NAUSEA AND VOMITING THAT IS NOT CONTROLLED WITH YOUR NAUSEA MEDICATION  *UNUSUAL SHORTNESS OF BREATH  *UNUSUAL BRUISING OR BLEEDING  TENDERNESS IN MOUTH AND THROAT WITH OR WITHOUT PRESENCE OF ULCERS  *URINARY PROBLEMS  *BOWEL PROBLEMS  UNUSUAL RASH Items with * indicate a potential emergency and should be followed up as soon as possible.  Feel free to call the clinic you have any questions or concerns. The clinic phone number is [phone].  Please show the [facility] at check-in to the Emergency Department and triage nurse.

## 2016-02-12 ENCOUNTER — Other Ambulatory Visit: Payer: Self-pay | Admitting: Hematology and Oncology

## 2016-02-12 DIAGNOSIS — C50411 Malignant neoplasm of upper-outer quadrant of right female breast: Secondary | ICD-10-CM

## 2016-02-12 MED ORDER — ONDANSETRON HCL 8 MG PO TABS: 8.0000 mg | ORAL_TABLET | Freq: Two times a day (BID) | ORAL | Status: DC | PRN

## 2016-02-12 MED ORDER — LIDOCAINE-PRILOCAINE 2.5-2.5 % EX CREA: TOPICAL_CREAM | CUTANEOUS | Status: DC

## 2016-02-12 MED ORDER — PROCHLORPERAZINE MALEATE 10 MG PO TABS: 10.0000 mg | ORAL_TABLET | Freq: Four times a day (QID) | ORAL | Status: DC | PRN

## 2016-02-13 ENCOUNTER — Other Ambulatory Visit: Payer: Self-pay | Admitting: Internal Medicine

## 2016-02-13 ENCOUNTER — Telehealth: Payer: Self-pay

## 2016-02-13 ENCOUNTER — Telehealth (HOSPITAL_COMMUNITY): Payer: Self-pay | Admitting: *Deleted

## 2016-02-13 DIAGNOSIS — J849 Interstitial pulmonary disease, unspecified: Secondary | ICD-10-CM

## 2016-02-13 MED ORDER — CIPROFLOXACIN HCL 500 MG PO TABS: 500.0000 mg | ORAL_TABLET | Freq: Two times a day (BID) | ORAL | Status: AC

## 2016-02-13 NOTE — Telephone Encounter (Signed)
Notes Recorded by Scarlette Calico, RN on 02/13/2016 at 2:07 PM Pt aware and agreeable, referral placed for pulm

## 2016-02-13 NOTE — Telephone Encounter (Signed)
Writer contacted daughter to check on patient.  She reports they obtained claritin and have given patient a dose, with a "little" relief.  I let her know Dr. Lindi Adie has said she can double up on the vicodin to help with the pain.  I advised she continue the claritin as long as she is having bone pain.    She reports they have had to get her mother a walker and a bedside commode.  We will check with Dr. Lindi Adie on Monday about home health support for DME, CNA assistance, and PT.    She reports the medication that did not work for her mother was tramadol.  Pt was given percocet in the hospital and it worked much better for her mother than the vicodin.  I let her know we will check with Dr. Lindi Adie on Monday to see if he can fill the percocet.  They do not have any percocet in the home.  I advised she continue claritin and doubling up vicodin until Monday.  She voiced understanding.

## 2016-02-13 NOTE — Telephone Encounter (Signed)
-----   Message from Jolaine Artist, MD sent at 02/05/2016  3:54 PM EDT ----- Minimal ILD. Please make sure she has a f/u with Pulmonary (Dr. Chase Caller)

## 2016-02-13 NOTE — Telephone Encounter (Signed)
Returned call to daughter re: pain.  She reports patient is requesting something stronger for pain.  Writer inquired if patient was taking claritin.  Daughter reports she is not taking.  I advised her that pain was most likely from the neulasta and she should try taking claritin first.  Daughter advised she would obtain claritin and they would try it.  I told her to call if this does not help.

## 2016-02-16 ENCOUNTER — Telehealth: Payer: Self-pay | Admitting: *Deleted

## 2016-02-16 ENCOUNTER — Telehealth: Payer: Self-pay | Admitting: Cardiology

## 2016-02-16 ENCOUNTER — Other Ambulatory Visit: Payer: Self-pay | Admitting: *Deleted

## 2016-02-16 DIAGNOSIS — C50411 Malignant neoplasm of upper-outer quadrant of right female breast: Secondary | ICD-10-CM

## 2016-02-16 MED ORDER — OXYCODONE-ACETAMINOPHEN 5-325 MG PO TABS: 1.0000 | ORAL_TABLET | Freq: Two times a day (BID) | ORAL | Status: DC | PRN

## 2016-02-16 NOTE — Telephone Encounter (Signed)
Pt advised to call MD managing port-a cath/chemo treatments today for follow up.  Pt verbalized understanding, agreed with plan.

## 2016-02-16 NOTE — Telephone Encounter (Signed)
Received telephone advice record from [facility], sent to scan. Spoke with patient and prescription for Percocet here ready for pickup, advised that she may take immodium for her diarrhea.

## 2016-02-16 NOTE — Telephone Encounter (Signed)
Pt states she had chemo treatment on Wednesday.  Pt states she has pain under the port a cath on her left side starting Saturday morning.  Pt states she has had diarrhea and nausea since Saturday morning, pt states she needs a refill on her pain medication.  Pt states denies increase in shortness of breath and states her breathing is good.

## 2016-02-16 NOTE — Telephone Encounter (Signed)
Call from Santiago Glad at [facility], insurance requires 22.00 copay for PT visits and does not cover home health aide which would be an 85.00 copay. She spoke with patient who stated that she does not have the money. Santiago Glad told patient that if patient does get the money then she can call us to start services.

## 2016-02-16 NOTE — Telephone Encounter (Signed)
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 Dr Meda Coffee told her if she experienced any chest discomfort or pain to contact her.

## 2016-02-16 NOTE — Telephone Encounter (Signed)
Called and spoke with patient's daughter Levada Dy. Prescription for Percocet is ready for pick up and that referral for home health was placed. She verbalized understanding.

## 2016-02-17 ENCOUNTER — Ambulatory Visit (INDEPENDENT_AMBULATORY_CARE_PROVIDER_SITE_OTHER): Payer: BC Managed Care – PPO | Admitting: Internal Medicine

## 2016-02-17 ENCOUNTER — Encounter: Payer: Self-pay | Admitting: Hematology and Oncology

## 2016-02-17 VITALS — BP 92/58 | HR 98 | Temp 98.2°F | Resp 16 | Ht 62.0 in | Wt 175.0 lb

## 2016-02-17 DIAGNOSIS — B356 Tinea cruris: Secondary | ICD-10-CM | POA: Diagnosis not present

## 2016-02-17 DIAGNOSIS — R300 Dysuria: Secondary | ICD-10-CM

## 2016-02-17 MED ORDER — CLOTRIMAZOLE-BETAMETHASONE 1-0.05 % EX CREA: TOPICAL_CREAM | CUTANEOUS | Status: DC

## 2016-02-17 MED ORDER — CEPHALEXIN 500 MG PO CAPS: 500.0000 mg | ORAL_CAPSULE | Freq: Three times a day (TID) | ORAL | Status: DC

## 2016-02-17 NOTE — Progress Notes (Signed)
   Subjective:    Patient ID: [name], female    DOB: December 08, 1955, 59 y.o.   MRN: [id number]  Dysuria  Associated symptoms include hematuria. Pertinent negatives include no chills, frequency, nausea or vomiting.   Patient presents to the office for evaluation of dysuria and also breakdown of her skin.  She reports that since she was hospitalized for norovirus and had severe diarrhea a couple weeks ago she has noticed that she has had some itching of the skin and some breakdown of the skin in her groin on both sides and also the middle of her thigh.  She reports that it does smell funny as well.  She has recently been on antibiotics.  She reports no vaginal discharge.  She also notes that she has been having some dysuria and burning when she urinates.  She reports that she does not have fever, nausea, or vomiting.  She does have some hematuria but this has not changed much.  She has history of very frequent UTI.  She cannot afford home health.  She reports that the increase in her pain medication has been helpful.  She is currently taking percocet as needed.  She never started keflex that was called in before the weekend.     Review of Systems  Constitutional: Positive for fatigue. Negative for fever and chills.  Gastrointestinal: Negative for nausea, vomiting, abdominal pain, diarrhea, constipation and blood in stool.  Genitourinary: Positive for dysuria, hematuria and genital sores. Negative for frequency, vaginal bleeding, vaginal discharge, difficulty urinating, vaginal pain and pelvic pain.  Neurological: Positive for weakness. Negative for dizziness and light-headedness.  Psychiatric/Behavioral: Negative for confusion and agitation.       Objective:   Physical Exam  Constitutional: She appears well-developed and well-nourished. No distress.  HENT:  Head: Normocephalic.  Mouth/Throat: No oropharyngeal exudate.  Eyes: Conjunctivae are normal. No scleral icterus.  Neck: Normal  range of motion. Neck supple. No JVD present. No thyromegaly present.  Cardiovascular: Normal rate, regular rhythm, normal heart sounds and intact distal pulses.  Exam reveals no gallop and no friction rub.   No murmur heard. Pulmonary/Chest: Effort normal and breath sounds normal. No respiratory distress. She has no wheezes. She has no rales. She exhibits no tenderness.  Abdominal: Soft. Normal appearance and bowel sounds are normal. She exhibits no distension and no mass. There is tenderness in the suprapubic area. There is no rebound, no guarding and no CVA tenderness.  Lymphadenopathy:    She has no cervical adenopathy.  Skin: She is not diaphoretic.     Nursing note and vitals reviewed.   Filed Vitals:   02/17/16 1102  BP: 92/58  Pulse: 98  Temp: 98.2 F (36.8 C)  Resp: 16          Assessment & Plan:    1. Dysuria -keflex -possible could be yeast infection -may need to consider use of diflucan - Urinalysis, Routine w reflex microscopic (not at [facility]); Future - Culture, Urine  2. Tinea cruris -air out as much as possible -keep clean and dry -cotton clothing only -lortisone cream -Dr. Lindi Adie has already placed referral to wound treatment center

## 2016-02-17 NOTE — Patient Instructions (Signed)
Cutaneous Candidiasis °Cutaneous candidiasis is a condition in which there is an overgrowth of yeast (candida) on the skin. Yeast normally live on the skin, but in small enough numbers not to cause any symptoms. In certain cases, increased growth of the yeast may cause an actual yeast infection. This kind of infection usually occurs in areas of the skin that are constantly warm and moist, such as the armpits or the groin. Yeast is the most common cause of diaper rash in babies and in people who cannot control their bowel movements (incontinence). °CAUSES  °The fungus that most often causes cutaneous candidiasis is Candida albicans. Conditions that can increase the risk of getting a yeast infection of the skin include: °· Obesity. °· Pregnancy. °· Diabetes. °· Taking antibiotic medicine. °· Taking birth control pills. °· Taking steroid medicines. °· Thyroid disease. °· An iron or zinc deficiency. °· Problems with the immune system. °SYMPTOMS  °· Red, swollen area of the skin. °· Bumps on the skin. °· Itchiness. °DIAGNOSIS  °The diagnosis of cutaneous candidiasis is usually based on its appearance. Light scrapings of the skin may also be taken and viewed under a microscope to identify the presence of yeast. °TREATMENT  °Antifungal creams may be applied to the infected skin. In severe cases, oral medicines may be needed.  °HOME CARE INSTRUCTIONS  °· Keep your skin clean and dry. °· Maintain a healthy weight. °· If you have diabetes, keep your blood sugar under control. °SEEK IMMEDIATE MEDICAL CARE IF: °· Your rash continues to spread despite treatment. °· You have a fever, chills, or abdominal pain. °  °This information is not intended to replace advice given to you by your health care provider. Make sure you discuss any questions you have with your health care provider. °  °Document Released: 06/22/2011 Document Revised: 12/27/2011 Document Reviewed: 04/07/2015 °Elsevier Interactive Patient Education ©2016 [employer] ° °

## 2016-02-17 NOTE — Progress Notes (Signed)
Revised and faxed back [phone] and sent to medical records

## 2016-02-18 LAB — URINE CULTURE

## 2016-02-18 LAB — URINALYSIS, MICROSCOPIC ONLY
BACTERIA UA: NONE SEEN [HPF]
CASTS: NONE SEEN [LPF]
Crystals: NONE SEEN [HPF]
YEAST: NONE SEEN [HPF]

## 2016-02-18 LAB — URINALYSIS, ROUTINE W REFLEX MICROSCOPIC
BILIRUBIN URINE: NEGATIVE
GLUCOSE, UA: NEGATIVE
Nitrite: NEGATIVE
PH: 7 (ref 5.0–8.0)
Specific Gravity, Urine: 1.026 (ref 1.001–1.035)

## 2016-02-19 ENCOUNTER — Encounter: Payer: Self-pay | Admitting: Internal Medicine

## 2016-02-21 ENCOUNTER — Other Ambulatory Visit: Payer: Self-pay | Admitting: Internal Medicine

## 2016-02-21 ENCOUNTER — Emergency Department (HOSPITAL_COMMUNITY): Payer: BC Managed Care – PPO

## 2016-02-21 ENCOUNTER — Encounter: Payer: Self-pay | Admitting: Internal Medicine

## 2016-02-21 ENCOUNTER — Encounter (HOSPITAL_COMMUNITY): Payer: Self-pay

## 2016-02-21 ENCOUNTER — Inpatient Hospital Stay (HOSPITAL_COMMUNITY)
Admission: EM | Admit: 2016-02-21 | Discharge: 2016-02-24 | DRG: 872 | Disposition: A | Discharge status: Home / Self Care | Payer: BC Managed Care – PPO | Attending: Internal Medicine | Admitting: Internal Medicine

## 2016-02-21 DIAGNOSIS — B029 Zoster without complications: Secondary | ICD-10-CM | POA: Diagnosis not present

## 2016-02-21 DIAGNOSIS — I10 Essential (primary) hypertension: Secondary | ICD-10-CM | POA: Diagnosis present

## 2016-02-21 DIAGNOSIS — L309 Dermatitis, unspecified: Secondary | ICD-10-CM | POA: Diagnosis present

## 2016-02-21 DIAGNOSIS — D696 Thrombocytopenia, unspecified: Secondary | ICD-10-CM | POA: Diagnosis present

## 2016-02-21 DIAGNOSIS — I480 Paroxysmal atrial fibrillation: Secondary | ICD-10-CM | POA: Diagnosis present

## 2016-02-21 DIAGNOSIS — R7303 Prediabetes: Secondary | ICD-10-CM | POA: Diagnosis present

## 2016-02-21 DIAGNOSIS — Z7901 Long term (current) use of anticoagulants: Secondary | ICD-10-CM | POA: Diagnosis not present

## 2016-02-21 DIAGNOSIS — A419 Sepsis, unspecified organism: Secondary | ICD-10-CM | POA: Diagnosis not present

## 2016-02-21 DIAGNOSIS — C50411 Malignant neoplasm of upper-outer quadrant of right female breast: Secondary | ICD-10-CM | POA: Diagnosis present

## 2016-02-21 DIAGNOSIS — E876 Hypokalemia: Secondary | ICD-10-CM | POA: Diagnosis present

## 2016-02-21 DIAGNOSIS — L03312 Cellulitis of back [any part except buttock]: Secondary | ICD-10-CM | POA: Diagnosis present

## 2016-02-21 DIAGNOSIS — D649 Anemia, unspecified: Secondary | ICD-10-CM | POA: Diagnosis present

## 2016-02-21 DIAGNOSIS — N200 Calculus of kidney: Secondary | ICD-10-CM

## 2016-02-21 DIAGNOSIS — L0390 Cellulitis, unspecified: Secondary | ICD-10-CM

## 2016-02-21 DIAGNOSIS — K219 Gastro-esophageal reflux disease without esophagitis: Secondary | ICD-10-CM | POA: Diagnosis present

## 2016-02-21 DIAGNOSIS — I959 Hypotension, unspecified: Secondary | ICD-10-CM | POA: Diagnosis present

## 2016-02-21 LAB — URINALYSIS, ROUTINE W REFLEX MICROSCOPIC
Bilirubin Urine: NEGATIVE
Glucose, UA: NEGATIVE mg/dL
Ketones, ur: NEGATIVE mg/dL
LEUKOCYTES UA: NEGATIVE
Nitrite: NEGATIVE
PROTEIN: 30 mg/dL — AB
Specific Gravity, Urine: 1.019 (ref 1.005–1.030)
pH: 6 (ref 5.0–8.0)

## 2016-02-21 LAB — CBC WITH DIFFERENTIAL/PLATELET
BASOS ABS: 0.1 10*3/uL (ref 0.0–0.1)
Basophils Relative: 1 %
EOS ABS: 0 10*3/uL (ref 0.0–0.7)
Eosinophils Relative: 0 %
HEMATOCRIT: 28.8 % — AB (ref 36.0–46.0)
HEMOGLOBIN: 9.4 g/dL — AB (ref 12.0–15.0)
LYMPHS PCT: 4 %
Lymphs Abs: 0.3 10*3/uL — ABNORMAL LOW (ref 0.7–4.0)
MCH: 29.1 pg (ref 26.0–34.0)
MCHC: 32.6 g/dL (ref 30.0–36.0)
MCV: 89.2 fL (ref 78.0–100.0)
Monocytes Absolute: 1 10*3/uL (ref 0.1–1.0)
Monocytes Relative: 16 %
NEUTROS PCT: 79 %
Neutro Abs: 5 10*3/uL (ref 1.7–7.7)
Platelets: 119 10*3/uL — ABNORMAL LOW (ref 150–400)
RBC: 3.23 MIL/uL — ABNORMAL LOW (ref 3.87–5.11)
RDW: 18.9 % — AB (ref 11.5–15.5)
WBC: 6.4 10*3/uL (ref 4.0–10.5)

## 2016-02-21 LAB — I-STAT CG4 LACTIC ACID, ED
LACTIC ACID, VENOUS: 1.68 mmol/L (ref 0.5–2.0)
Lactic Acid, Venous: 1.51 mmol/L (ref 0.5–2.0)

## 2016-02-21 LAB — COMPREHENSIVE METABOLIC PANEL
ALK PHOS: 70 U/L (ref 38–126)
ALT: 19 U/L (ref 14–54)
AST: 21 U/L (ref 15–41)
Albumin: 2.8 g/dL — ABNORMAL LOW (ref 3.5–5.0)
Anion gap: 12 (ref 5–15)
CALCIUM: 9.4 mg/dL (ref 8.9–10.3)
CHLORIDE: 103 mmol/L (ref 101–111)
CO2: 25 mmol/L (ref 22–32)
CREATININE: 0.69 mg/dL (ref 0.44–1.00)
GFR calc non Af Amer: >60 mL/min (ref >60)
Glucose, Bld: 91 mg/dL (ref 65–99)
Potassium: 3.3 mmol/L — ABNORMAL LOW (ref 3.5–5.1)
SODIUM: 140 mmol/L (ref 135–145)
Total Bilirubin: 0.5 mg/dL (ref 0.3–1.2)
Total Protein: 5.6 g/dL — ABNORMAL LOW (ref 6.5–8.1)

## 2016-02-21 LAB — URINE MICROSCOPIC-ADD ON

## 2016-02-21 LAB — MAGNESIUM: Magnesium: 1.2 mg/dL — ABNORMAL LOW (ref 1.7–2.4)

## 2016-02-21 MED ORDER — MONTELUKAST SODIUM 10 MG PO TABS
10.0000 mg | ORAL_TABLET | Freq: Every day | ORAL | Status: DC
  Administered 2016-02-21 – 2016-02-23 (×3): 10 mg via ORAL
  Filled 2016-02-21 (×3): qty 1

## 2016-02-21 MED ORDER — CLOTRIMAZOLE-BETAMETHASONE 1-0.05 % EX CREA
1.0000 "application " | TOPICAL_CREAM | Freq: Two times a day (BID) | CUTANEOUS | Status: DC | PRN
  Administered 2016-02-22 – 2016-02-23 (×4): 1 via TOPICAL
  Filled 2016-02-21: qty 15

## 2016-02-21 MED ORDER — ONDANSETRON HCL 4 MG/2ML IJ SOLN
4.0000 mg | Freq: Four times a day (QID) | INTRAMUSCULAR | Status: DC | PRN
  Administered 2016-02-22 – 2016-02-23 (×2): 4 mg via INTRAVENOUS
  Filled 2016-02-21 (×2): qty 2

## 2016-02-21 MED ORDER — PREDNISONE 20 MG PO TABS: ORAL_TABLET | ORAL | Status: DC

## 2016-02-21 MED ORDER — DOXEPIN HCL 50 MG PO CAPS
50.0000 mg | ORAL_CAPSULE | Freq: Every day | ORAL | Status: DC
  Administered 2016-02-21 – 2016-02-23 (×3): 50 mg via ORAL
  Filled 2016-02-21 (×4): qty 1

## 2016-02-21 MED ORDER — SODIUM CHLORIDE 0.9 % IV BOLUS (SEPSIS)
500.0000 mL | Freq: Once | INTRAVENOUS | Status: AC
  Administered 2016-02-21: 500 mL via INTRAVENOUS

## 2016-02-21 MED ORDER — SODIUM CHLORIDE 0.9 % IV BOLUS (SEPSIS)
1000.0000 mL | Freq: Once | INTRAVENOUS | Status: AC
Start: 2016-02-21 — End: 2016-02-21
  Administered 2016-02-21: 1000 mL via INTRAVENOUS

## 2016-02-21 MED ORDER — OLOPATADINE HCL 0.1 % OP SOLN
1.0000 [drp] | Freq: Every day | OPHTHALMIC | Status: DC | PRN
  Administered 2016-02-24: 1 [drp] via OPHTHALMIC
  Filled 2016-02-21: qty 5

## 2016-02-21 MED ORDER — OXYCODONE HCL 5 MG PO TABS
5.0000 mg | ORAL_TABLET | Freq: Four times a day (QID) | ORAL | Status: DC | PRN
  Administered 2016-02-21 – 2016-02-24 (×4): 5 mg via ORAL
  Filled 2016-02-21 (×4): qty 1

## 2016-02-21 MED ORDER — POTASSIUM CHLORIDE IN NACL 20-0.9 MEQ/L-% IV SOLN
INTRAVENOUS | Status: AC
  Filled 2016-02-21: qty 1000

## 2016-02-21 MED ORDER — FERROUS SULFATE 325 (65 FE) MG PO TABS
325.0000 mg | ORAL_TABLET | Freq: Every day | ORAL | Status: DC
  Administered 2016-02-22 – 2016-02-24 (×3): 325 mg via ORAL
  Filled 2016-02-21 (×3): qty 1

## 2016-02-21 MED ORDER — POTASSIUM CHLORIDE CRYS ER 20 MEQ PO TBCR
40.0000 meq | EXTENDED_RELEASE_TABLET | Freq: Once | ORAL | Status: AC
  Administered 2016-02-21: 40 meq via ORAL
  Filled 2016-02-21: qty 2

## 2016-02-21 MED ORDER — FENTANYL CITRATE (PF) 100 MCG/2ML IJ SOLN
50.0000 ug | INTRAMUSCULAR | Status: DC | PRN
  Administered 2016-02-21 (×2): 50 ug via INTRAVENOUS
  Filled 2016-02-21 (×2): qty 2

## 2016-02-21 MED ORDER — DIAZEPAM 5 MG PO TABS
5.0000 mg | ORAL_TABLET | Freq: Every morning | ORAL | Status: DC
  Administered 2016-02-22 – 2016-02-24 (×3): 5 mg via ORAL
  Filled 2016-02-21 (×3): qty 1

## 2016-02-21 MED ORDER — POTASSIUM CHLORIDE CRYS ER 20 MEQ PO TBCR
20.0000 meq | EXTENDED_RELEASE_TABLET | Freq: Two times a day (BID) | ORAL | Status: DC
  Administered 2016-02-22 – 2016-02-24 (×5): 20 meq via ORAL
  Filled 2016-02-21 (×6): qty 1

## 2016-02-21 MED ORDER — ACETAMINOPHEN 325 MG PO TABS
650.0000 mg | ORAL_TABLET | Freq: Once | ORAL | Status: AC
  Administered 2016-02-21: 650 mg via ORAL
  Filled 2016-02-21: qty 2

## 2016-02-21 MED ORDER — CLINDAMYCIN PHOSPHATE 900 MG/50ML IV SOLN
900.0000 mg | Freq: Once | INTRAVENOUS | Status: AC
  Administered 2016-02-21: 900 mg via INTRAVENOUS
  Filled 2016-02-21: qty 50

## 2016-02-21 MED ORDER — PROCHLORPERAZINE MALEATE 10 MG PO TABS
10.0000 mg | ORAL_TABLET | Freq: Four times a day (QID) | ORAL | Status: DC | PRN
  Filled 2016-02-21: qty 1

## 2016-02-21 MED ORDER — ONDANSETRON HCL 4 MG PO TABS
4.0000 mg | ORAL_TABLET | Freq: Four times a day (QID) | ORAL | Status: DC | PRN
  Filled 2016-02-21: qty 1

## 2016-02-21 MED ORDER — VITAMIN D 1000 UNITS PO TABS
4000.0000 [IU] | ORAL_TABLET | Freq: Every day | ORAL | Status: DC
  Administered 2016-02-22 – 2016-02-24 (×3): 4000 [IU] via ORAL
  Filled 2016-02-21 (×3): qty 4

## 2016-02-21 MED ORDER — ACETAMINOPHEN 325 MG PO TABS
650.0000 mg | ORAL_TABLET | Freq: Four times a day (QID) | ORAL | Status: DC | PRN
Start: 2016-02-21 — End: 2016-02-24
  Administered 2016-02-22 (×2): 650 mg via ORAL
  Filled 2016-02-21 (×3): qty 2

## 2016-02-21 MED ORDER — TRIAMCINOLONE ACETONIDE 0.1 % EX CREA
1.0000 "application " | TOPICAL_CREAM | Freq: Three times a day (TID) | CUTANEOUS | Status: DC
  Administered 2016-02-22 – 2016-02-24 (×7): 1 via TOPICAL
  Filled 2016-02-21 (×2): qty 15

## 2016-02-21 MED ORDER — RIVAROXABAN 20 MG PO TABS
20.0000 mg | ORAL_TABLET | Freq: Every day | ORAL | Status: DC
  Administered 2016-02-22 – 2016-02-24 (×3): 20 mg via ORAL
  Filled 2016-02-21 (×4): qty 1

## 2016-02-21 MED ORDER — SODIUM CHLORIDE 0.9% FLUSH: 3.0000 mL | Freq: Two times a day (BID) | INTRAVENOUS | Status: DC

## 2016-02-21 MED ORDER — DEXTROSE 5 % IV SOLN
10.0000 mg/kg | Freq: Once | INTRAVENOUS | Status: AC
  Administered 2016-02-21: 615 mg via INTRAVENOUS
  Filled 2016-02-21: qty 12.3

## 2016-02-21 MED ORDER — TRIAMCINOLONE ACETONIDE 0.1 % EX CREA: 1.0000 "application " | TOPICAL_CREAM | Freq: Three times a day (TID) | CUTANEOUS | Status: DC

## 2016-02-21 MED ORDER — SODIUM CHLORIDE 0.9 % IV BOLUS (SEPSIS)
1000.0000 mL | Freq: Once | INTRAVENOUS | Status: AC
  Administered 2016-02-21: 1000 mL via INTRAVENOUS

## 2016-02-21 MED ORDER — LORAZEPAM 0.5 MG PO TABS
0.5000 mg | ORAL_TABLET | Freq: Four times a day (QID) | ORAL | Status: DC | PRN
  Administered 2016-02-22: 0.5 mg via ORAL
  Filled 2016-02-21: qty 1

## 2016-02-21 NOTE — ED Provider Notes (Signed)
CSN: [id number]     Arrival date & time 02/21/16  1553 History   First MD Initiated Contact with Patient 02/21/16 1606     Chief Complaint  Patient presents with  . Abdominal Pain  . Rash     (Consider location/radiation/quality/duration/timing/severity/associated sxs/prior Treatment) Patient is a 60 y.o. female presenting with general illness. The history is provided by the patient, medical records and a relative.  Illness Location:  Fever Severity:  Moderate Onset quality:  Gradual Duration:  1 day Timing:  Constant Progression:  Worsening Chronicity:  New Context:  Hx breast cancer, currently on chemotherapy. Multiple complaints. felt feverish at home. Develooped rash to the left upper back. Also has had a mild cough, non productive. Also has had some loose stool, non bloody, no n/v, c/w since starting chemotherapy. Also has some chronic wounds to medial aspect of bilateral thighs, reported to be improving, no purennt drainage.  Associated symptoms: abdominal pain, cough, diarrhea, fatigue and fever   Associated symptoms: no chest pain, no congestion, no headaches, no nausea, no shortness of breath and no vomiting     Past Medical History  Diagnosis Date  . MRSA (methicillin resistant staph aureus) culture positive 2009     none since per patient  . Eczema   . Hypercholesteremia   . Hypertension   . Kidney stones   . Anemia   . Vitamin D deficiency   . Paroxysmal atrial fibrillation ([place]) 10/2011, 06/2015  . GERD (gastroesophageal reflux disease)   . Atrial fibrillation ([place]) 2016  . Dysrhythmia     A FIB - followed by Dr. Meda Coffee  . Breast cancer of upper-outer quadrant of right female breast ([place]) 12/12/2015  . Breast cancer ([place])   . Complication of anesthesia 10-06-15     slow to awaken after   . PONV (postoperative nausea and vomiting) 10-06-15    admitted back to hospital for dehydration   Past Surgical History  Procedure Laterality Date  . Breast surgery Left      biopsy-neg  . Kidney stone surgery  2013  . Nephrolithotomy Right 10/06/2015    Procedure: NEPHROLITHOTOMY PERCUTANEOUS;  Surgeon: Kathie Rhodes, MD;  Location: [facility];  Service: Urology;  Laterality: Right;  . Cystoscopy with holmium laser lithotripsy Right 10/06/2015    Procedure: CYSTOSCOPY WITH HOLMIUM LASER LITHOTRIPSY;  Surgeon: Kathie Rhodes, MD;  Location: [facility];  Service: Urology;  Laterality: Right;  . Portacath placement Left 12/23/2015    Procedure: INSERTION PORT-A-CATH WITH [place];  Surgeon: Stark Klein, MD;  Location: [facility];  Service: General;  Laterality: Left;   Family History  Problem Relation Age of Onset  . Hypertension Mother   . Hepatitis C Mother   . Heart disease Father   . Dementia Father   . Aneurysm Brother    Social History  Substance Use Topics  . Smoking status: Never Smoker   . Smokeless tobacco: Never Used  . Alcohol Use: No   OB History    No data available     Review of Systems  Constitutional: Positive for fever, chills and fatigue.  HENT: Negative for congestion.   Respiratory: Positive for cough. Negative for shortness of breath.   Cardiovascular: Negative for chest pain.  Gastrointestinal: Positive for abdominal pain and diarrhea. Negative for nausea, vomiting and blood in stool.  Genitourinary: Positive for dysuria and frequency. Negative for decreased urine volume, vaginal bleeding and vaginal discharge.  Musculoskeletal: Negative for back pain.  Skin: Positive for wound.  Neurological:  Negative for light-headedness and headaches.  All other systems reviewed and are negative.     Allergies  Penicillins  Home Medications   Prior to Admission medications   Medication Sig Start Date End Date Taking? Authorizing Provider  acetaminophen (TYLENOL) 500 MG tablet Take 1,000 mg by mouth every 6 (six) hours as needed for moderate pain.   Yes Historical Provider, MD  atenolol (TENORMIN) 100 MG tablet take 1/2 to 1 tablet by mouth once  daily as directed Patient taking differently: takes 1 tab by mouth once daily 02/11/16  Yes Unk Pinto, MD  cholecalciferol (VITAMIN D) 1000 UNITS tablet Take 4,000 Units by mouth daily.   Yes Historical Provider, MD  clotrimazole-betamethasone (LOTRISONE) cream Apply to affected area 2 times daily Patient taking differently: Apply 1 application topically 2 (two) times daily as needed (for irritation). Apply to affected area 2 times daily 02/17/16 02/16/17 Yes Courtney Forcucci, PA-C  dexamethasone (DECADRON) 4 MG tablet Take 1 tablet (4 mg total) by mouth See admin instructions. Take 1 tablets by mouth once a day on the day after chemotherapy and then take 1 tablets two times a day for 2 days. Take with  food. Chemotherapy on Thursdays. 01/28/16  Yes Susanne Borders, NP  diazepam (VALIUM) 5 MG tablet Take 5 mg by mouth every morning.    Yes Historical Provider, MD  diltiazem (CARDIZEM CD) 120 MG 24 hr capsule Take 1 capsule (120 mg total) by mouth daily. 10/24/15  Yes Dorothy Spark, MD  doxepin (SINEQUAN) 50 MG capsule take 1 capsule by mouth once daily at bedtime 02/10/16  Yes Vicie Mutters, PA-C  ferrous sulfate 325 (65 FE) MG tablet Take 325 mg by mouth daily with breakfast.   Yes Historical Provider, MD  lidocaine-prilocaine (EMLA) cream Apply to affected area once 02/12/16  Yes Nicholas Lose, MD  LORazepam (ATIVAN) 0.5 MG tablet Take 1 tablet (0.5 mg total) by mouth every 6 (six) hours as needed (nausea). 02/11/16  Yes Nicholas Lose, MD  montelukast (SINGULAIR) 10 MG tablet Take 1 tablet (10 mg total) by mouth daily. Patient taking differently: Take 10 mg by mouth at bedtime.  12/26/15 12/25/16 Yes Courtney Forcucci, PA-C  ondansetron (ZOFRAN) 8 MG tablet Take 1 tablet (8 mg total) by mouth 2 (two) times daily as needed (Nausea or vomiting). 02/12/16  Yes Nicholas Lose, MD  oxyCODONE-acetaminophen (PERCOCET/ROXICET) 5-325 MG tablet Take 1 tablet by mouth every 12 (twelve) hours as needed for severe  pain. 02/16/16  Yes Nicholas Lose, MD  PAZEO 0.7 % SOLN Place 1 drop into both eyes daily as needed (allergies).  12/15/15  Yes Historical Provider, MD  potassium chloride SA (K-DUR,KLOR-CON) 20 MEQ tablet Take 20 mEq by mouth 2 (two) times daily. 12/04/15  Yes Historical Provider, MD  prochlorperazine (COMPAZINE) 10 MG tablet Take 1 tablet (10 mg total) by mouth every 6 (six) hours as needed (Nausea or vomiting). 02/11/16  Yes Nicholas Lose, MD  rivaroxaban (XARELTO) 20 MG TABS tablet Take 1 tablet (20 mg total) by mouth daily with supper. 01/23/16  Yes Theodis Blaze, MD  triamterene-hydrochlorothiazide (MAXZIDE-25) 37.5-25 MG tablet Take 1 tablet by mouth daily. 12/15/15  Yes Courtney Forcucci, PA-C  cephALEXin (KEFLEX) 500 MG capsule Take 1 capsule (500 mg total) by mouth 3 (three) times daily. 02/17/16 02/27/16  Courtney Forcucci, PA-C  predniSONE (DELTASONE) 20 MG tablet 3 tabs po day one, then 2 tabs daily x 4 days 02/21/16   Starlyn Skeans, PA-C  prochlorperazine (COMPAZINE)  10 MG tablet Take 1 tablet (10 mg total) by mouth every 6 (six) hours as needed (Nausea or vomiting). 02/12/16   Nicholas Lose, MD  triamcinolone cream (KENALOG) 0.1 % Apply 1 application topically 3 (three) times daily. 02/21/16   Courtney Forcucci, PA-C   BP 102/65 mmHg  Pulse 96  Temp(Src) 100.4 F (38 C) (Oral)  Resp 24  Ht 5\' 2"  (1.575 m)  Wt 78.926 kg  BMI 31.82 kg/m2  SpO2 95% Physical Exam  Constitutional: She is oriented to person, place, and time. She appears well-developed and well-nourished. No distress.  HENT:  Head: Normocephalic and atraumatic.  Mouth/Throat: Mucous membranes are not dry.  Cardiovascular: Regular rhythm.  Tachycardia present.   Pulmonary/Chest: No respiratory distress. She has no wheezes. She has no rhonchi.  Abdominal: There is generalized tenderness. There is no rebound, no guarding, no CVA tenderness, no tenderness at McBurney's point and negative Murphy's sign.  Genitourinary:  Wounds to  bilateral medial thighs. No evidence of abscess or cellulitis. No drainage. Appear well healing.   Musculoskeletal: She exhibits no edema.  Neurological: She is alert and oriented to person, place, and time.  Skin: Rash noted.       ED Course  Procedures (including critical care time) Labs Review Labs Reviewed  COMPREHENSIVE METABOLIC PANEL - Abnormal; Notable for the following:    Potassium 3.3 (*)    BUN <5 (*)    Total Protein 5.6 (*)    Albumin 2.8 (*)    All other components within normal limits  URINALYSIS, ROUTINE W REFLEX MICROSCOPIC (NOT AT [facility]) - Abnormal; Notable for the following:    APPearance CLOUDY (*)    Hgb urine dipstick LARGE (*)    Protein, ur 30 (*)    All other components within normal limits  CBC WITH DIFFERENTIAL/PLATELET - Abnormal; Notable for the following:    RBC 3.23 (*)    Hemoglobin 9.4 (*)    HCT 28.8 (*)    RDW 18.9 (*)    Platelets 119 (*)    Lymphs Abs 0.3 (*)    All other components within normal limits  URINE MICROSCOPIC-ADD ON - Abnormal; Notable for the following:    Squamous Epithelial / LPF 0-5 (*)    Bacteria, UA RARE (*)    Casts HYALINE CASTS (*)    All other components within normal limits  URINE CULTURE  CULTURE, BLOOD (ROUTINE X 2)  CULTURE, BLOOD (ROUTINE X 2)  MAGNESIUM  I-STAT CG4 LACTIC ACID, ED  I-STAT CG4 LACTIC ACID, ED    Imaging Review Dg Chest 2 View  02/21/2016  CLINICAL DATA:  60 year old female with a history of multiple abdominal complaints. EXAM: CHEST - 2 VIEW COMPARISON:  Chest CT 01/30/2016, plain film 12/23/2015 FINDINGS: Cardiomediastinal silhouette unchanged. No evidence of pulmonary vascular congestion. Unchanged position of left chest wall subclavian approach port catheter Low lung volumes. No pneumothorax. No pleural effusion or confluent airspace disease. No displaced fracture. Unremarkable appearance of the upper abdomen. IMPRESSION: Low lung volumes with likely atelectasis and no evidence of  superimposed acute cardiopulmonary disease. Signed, Dulcy Fanny. Earleen Newport, DO [facility] Radiology Electronically Signed   By: Corrie Mckusick D.O.   On: 02/21/2016 17:55   I have personally reviewed and evaluated these images and lab results as part of my medical decision-making.   EKG Interpretation   Date/Time:  Saturday Feb 21 2016 18:06:19 EDT Ventricular Rate:  101 PR Interval:  184 QRS Duration: 100  QT Interval:  325 QTC Calculation: 421 R Axis:   65 Text Interpretation:  Sinus tachycardia otherwise no significant change  Confirmed by RAY MD, Andee Poles [id number]) on 02/21/2016 6:23:26 PM      MDM   Final diagnoses:  Sepsis due to cellulitis [facility])    Patient is a 60 year old female with a past medical history breast cancer currently on chemotherapy, presenting with sepsis. Meets SIRS criteria with fever and tachycardia. Source with likely cellulitis on her left upper back. Lactate was not greater than 4. Not hypotensive here. Gave the patient IV fluids, but will not give full 30cc/kg given normal lactate and BP. Started the patient on antibiotics and antipyretics. Will also start on acyclovir, given recent exposure to shingles, with this rash in somewhat dermatomal distribution; however, no vesicles seen. No leukocytosis. Mild hypokalemia; replaced here. Anemia here to 9.4; decreased from baseline.  Patient had multiple other complaints. Was reporting cough that was nonproductive of sputum. Chest x-ray without evidence of pneumonia. Also reporting abdominal discomfort with some diarrhea cycle and reports that this is consistent with her baseline. She had minimal tenderness palpation and didn't have any perineal signs. Doubt acute abdomen. She also was reporting that she is having some chronic wounds on the medial aspect of bilateral thighs. No evidence of active infection. No purulence. No evidence of cellulitis or abscess.  On reevaluation the  patient is reporting some improvement in symptoms. Continues to be tachycardic though. Given the setting of sepsis in a chemotherapy patient, will admit to the hospital for further intervention and observation.  21:05 Called to the patient's room as the patient had become hypotensive gradually over the last hour or so. Systolic now in the [id number]. We gave the patient the remainder of her 30 mL/kg bolus. The hospitalist was made aware. Advised to start the patient on normal saline infusion with potassium chloride added to it and to change the bed request to telemetry. Patient currently has 2 peripheral IVs. We'll continue to observe her blood pressure until admission.  10:17 PM Patient admitted in stable condition with last noted BP 95/57.  Maryan Puls, MD 02/21/16 [id number]  Pattricia Boss, MD 02/25/16 [phone]

## 2016-02-21 NOTE — H&P (Signed)
History and Physical    [name] [id number] DOB: 1955-12-29 DOA: 02/21/2016  Referring MD/NP/PA: Maryan Puls, MD PCP: Alesia Richards, MD  Outpatient Specialists: Ena Dawley, MD (cardiology) Patient coming from: Home.  Chief Complaint: Abdominal pain, skin rash and weakness.  HPI: [name] is a 60 y.o. female with medical history significant of MRSA infection in 2009, eczema, hyperlipidemia, hypertension, urolithiasis, anemia, paroxysmal atrial fibrillation, GERD, breast cancer who comes emergency department with complaints of abdominal pain, skin rash, weakness for several days and fever for 1 day.  The patient recently started chemotherapy for her breast cancer.  She also complains of decreased appetite and insomnia.   ED Course: In the ER, the patient received IV fluids, IV acyclovir, IV clindamycin, antiemetics, analgesics and states that treatment has provided relief. Workup shows anemia and hypokalemia. Chest radiograph showed atelectasis, but no acute cardiopulmonary pathology. Urinalysis still pending.  Review of Systems: As per HPI otherwise 10 point review of systems negative.    Past Medical History  Diagnosis Date  . MRSA (methicillin resistant staph aureus) culture positive 2009     none since per patient  . Eczema   . Hypercholesteremia   . Hypertension   . Kidney stones   . Anemia   . Vitamin D deficiency   . Paroxysmal atrial fibrillation ([place]) 10/2011, 06/2015  . GERD (gastroesophageal reflux disease)   . Atrial fibrillation ([place]) 2016  . Dysrhythmia     A FIB - followed by Dr. Meda Coffee  . Breast cancer of upper-outer quadrant of right female breast ([place]) 12/12/2015  . Breast cancer ([place])   . Complication of anesthesia 10-06-15     slow to awaken after   . PONV (postoperative nausea and vomiting) 10-06-15    admitted back to hospital for dehydration    Past Surgical History  Procedure Laterality Date  . Breast surgery Left      biopsy-neg  . Kidney stone surgery  2013  . Nephrolithotomy Right 10/06/2015    Procedure: NEPHROLITHOTOMY PERCUTANEOUS;  Surgeon: Kathie Rhodes, MD;  Location: [facility];  Service: Urology;  Laterality: Right;  . Cystoscopy with holmium laser lithotripsy Right 10/06/2015    Procedure: CYSTOSCOPY WITH HOLMIUM LASER LITHOTRIPSY;  Surgeon: Kathie Rhodes, MD;  Location: [facility];  Service: Urology;  Laterality: Right;  . Portacath placement Left 12/23/2015    Procedure: INSERTION PORT-A-CATH WITH [place];  Surgeon: Stark Klein, MD;  Location: [facility];  Service: General;  Laterality: Left;     reports that she has never smoked. She has never used smokeless tobacco. She reports that she does not drink alcohol or use illicit drugs.  Allergies  Allergen Reactions  . Penicillins Other (See Comments)         Family History  Problem Relation Age of Onset  . Hypertension Mother   . Hepatitis C Mother   . Heart disease Father   . Dementia Father   . Aneurysm Brother   Family reviewed with the patient.  Prior to Admission medications   Medication Sig Start Date End Date Taking? Authorizing Provider  acetaminophen (TYLENOL) 500 MG tablet Take 1,000 mg by mouth every 6 (six) hours as needed for moderate pain.   Yes Historical Provider, MD  atenolol (TENORMIN) 100 MG tablet take 1/2 to 1 tablet by mouth once daily as directed Patient taking differently: takes 1 tab by mouth once daily 02/11/16  Yes Unk Pinto, MD  cholecalciferol (VITAMIN D) 1000 UNITS tablet Take 4,000 Units by  mouth daily.   Yes Historical Provider, MD  clotrimazole-betamethasone (LOTRISONE) cream Apply to affected area 2 times daily Patient taking differently: Apply 1 application topically 2 (two) times daily as needed (for irritation). Apply to affected area 2 times daily 02/17/16 02/16/17 Yes Courtney Forcucci, PA-C  dexamethasone (DECADRON) 4 MG tablet Take 1 tablet (4 mg total) by mouth See admin instructions. Take 1 tablets by  mouth once a day on the day after chemotherapy and then take 1 tablets two times a day for 2 days. Take with  food. Chemotherapy on Thursdays. 01/28/16  Yes Susanne Borders, NP  diazepam (VALIUM) 5 MG tablet Take 5 mg by mouth every morning.    Yes Historical Provider, MD  diltiazem (CARDIZEM CD) 120 MG 24 hr capsule Take 1 capsule (120 mg total) by mouth daily. 10/24/15  Yes Dorothy Spark, MD  doxepin (SINEQUAN) 50 MG capsule take 1 capsule by mouth once daily at bedtime 02/10/16  Yes Vicie Mutters, PA-C  ferrous sulfate 325 (65 FE) MG tablet Take 325 mg by mouth daily with breakfast.   Yes Historical Provider, MD  lidocaine-prilocaine (EMLA) cream Apply to affected area once 02/12/16  Yes Nicholas Lose, MD  LORazepam (ATIVAN) 0.5 MG tablet Take 1 tablet (0.5 mg total) by mouth every 6 (six) hours as needed (nausea). 02/11/16  Yes Nicholas Lose, MD  montelukast (SINGULAIR) 10 MG tablet Take 1 tablet (10 mg total) by mouth daily. Patient taking differently: Take 10 mg by mouth at bedtime.  12/26/15 12/25/16 Yes Courtney Forcucci, PA-C  ondansetron (ZOFRAN) 8 MG tablet Take 1 tablet (8 mg total) by mouth 2 (two) times daily as needed (Nausea or vomiting). 02/12/16  Yes Nicholas Lose, MD  oxyCODONE-acetaminophen (PERCOCET/ROXICET) 5-325 MG tablet Take 1 tablet by mouth every 12 (twelve) hours as needed for severe pain. 02/16/16  Yes Nicholas Lose, MD  PAZEO 0.7 % SOLN Place 1 drop into both eyes daily as needed (allergies).  12/15/15  Yes Historical Provider, MD  potassium chloride SA (K-DUR,KLOR-CON) 20 MEQ tablet Take 20 mEq by mouth 2 (two) times daily. 12/04/15  Yes Historical Provider, MD  prochlorperazine (COMPAZINE) 10 MG tablet Take 1 tablet (10 mg total) by mouth every 6 (six) hours as needed (Nausea or vomiting). 02/11/16  Yes Nicholas Lose, MD  rivaroxaban (XARELTO) 20 MG TABS tablet Take 1 tablet (20 mg total) by mouth daily with supper. 01/23/16  Yes Theodis Blaze, MD  triamterene-hydrochlorothiazide  (MAXZIDE-25) 37.5-25 MG tablet Take 1 tablet by mouth daily. 12/15/15  Yes Courtney Forcucci, PA-C  cephALEXin (KEFLEX) 500 MG capsule Take 1 capsule (500 mg total) by mouth 3 (three) times daily. 02/17/16 02/27/16  Courtney Forcucci, PA-C  predniSONE (DELTASONE) 20 MG tablet 3 tabs po day one, then 2 tabs daily x 4 days 02/21/16   Starlyn Skeans, PA-C  prochlorperazine (COMPAZINE) 10 MG tablet Take 1 tablet (10 mg total) by mouth every 6 (six) hours as needed (Nausea or vomiting). 02/12/16   Nicholas Lose, MD  triamcinolone cream (KENALOG) 0.1 % Apply 1 application topically 3 (three) times daily. 02/21/16   Starlyn Skeans, PA-C    Physical Exam: Filed Vitals:   02/21/16 1823 02/21/16 1845 02/21/16 1930 02/21/16 2057  BP: 103/73 100/59 102/65 90/60  Pulse: 98 95 96 93  Temp:      TempSrc:      Resp: 19 33 24 22  Height:      Weight:      SpO2: 100% 95% 95%  95%      Constitutional: NAD, calm, comfortable Filed Vitals:   02/21/16 1823 02/21/16 1845 02/21/16 1930 02/21/16 2057  BP: 103/73 100/59 102/65 90/60  Pulse: 98 95 96 93  Temp:      TempSrc:      Resp: 19 33 24 22  Height:      Weight:      SpO2: 100% 95% 95% 95%   Eyes: PERRL, lids and conjunctivae normal ENMT: Mucous membranes are dry. Posterior pharynx clear of any exudate or lesions.Normal dentition.  Neck: normal, supple, no masses, no thyromegaly Respiratory: clear to auscultation bilaterally, no wheezing, no crackles. Normal respiratory effort. No accessory muscle use.  Cardiovascular: Regular rate and rhythm, no murmurs / rubs / gallops. No extremity edema. 2+ pedal pulses. No carotid bruits.  Abdomen: Positive LUQ tenderness to light palpation on affected dermatome area, no masses palpated.                   No hepatosplenomegaly. Bowel sounds positive.  Musculoskeletal: no clubbing / cyanosis. No joint deformity upper and lower extremities. Good ROM, no contractures. Normal muscle tone.  Skin: Positive rash on her  back left thoracic area. Bilateral thigh wounds without erythema or discharge. Neurologic:  Global weakness, but no focalities.  Psychiatric: Normal judgment and insight. Alert and oriented x 3. Normal mood.     Labs on Admission: I have personally reviewed following labs and imaging studies  CBC:  Recent Labs Lab 02/21/16 1612  WBC 6.4  NEUTROABS 5.0  HGB 9.4*  HCT 28.8*  MCV 89.2  PLT 123456*   Basic Metabolic Panel:  Recent Labs Lab 02/21/16 1612  NA 140  K 3.3*  CL 103  CO2 25  GLUCOSE 91  BUN <5*  CREATININE 0.69  CALCIUM 9.4   GFR: Estimated Creatinine Clearance: 71.8 mL/min (by C-G formula based on Cr of 0.69). Liver Function Tests:  Recent Labs Lab 02/21/16 1612  AST 21  ALT 19  ALKPHOS 70  BILITOT 0.5  PROT 5.6*  ALBUMIN 2.8*   Urine analysis:    Component Value Date/Time   COLORURINE DARK YELLOW 02/17/2016 1221   APPEARANCEUR CLEAR 02/17/2016 1221   LABSPEC 1.026 02/17/2016 1221   PHURINE 7.0 02/17/2016 1221   GLUCOSEU NEGATIVE 02/17/2016 1221   HGBUR 3+* 02/17/2016 [place] 02/17/2016 1221   KETONESUR TRACE* 02/17/2016 1221   PROTEINUR 2+* 02/17/2016 1221   UROBILINOGEN 0.2 08/03/2015 0725   NITRITE NEGATIVE 02/17/2016 1221   LEUKOCYTESUR TRACE* 02/17/2016 1221    Recent Results (from the past 240 hour(s))  Culture, Urine     Status: None   Collection Time: 02/17/16 12:21 PM  Result Value Ref Range Status   Colony Count 25,000 COLONIES/ML  Final   Organism ID, Bacteria Multiple bacterial morphotypes present, none  Final   Organism ID, Bacteria predominant. Suggest appropriate recollection if   Final   Organism ID, Bacteria clinically indicated.  Final     Radiological Exams on Admission: Dg Chest 2 View  02/21/2016  CLINICAL DATA:  60 year old female with a history of multiple abdominal complaints. EXAM: CHEST - 2 VIEW COMPARISON:  Chest CT 01/30/2016, plain film 12/23/2015 FINDINGS: Cardiomediastinal silhouette  unchanged. No evidence of pulmonary vascular congestion. Unchanged position of left chest wall subclavian approach port catheter Low lung volumes. No pneumothorax. No pleural effusion or confluent airspace disease. No displaced fracture. Unremarkable appearance of the upper abdomen. IMPRESSION: Low lung volumes with likely atelectasis and  no evidence of superimposed acute cardiopulmonary disease. Signed, Dulcy Fanny. Earleen Newport, DO [facility] Radiology Electronically Signed   By: Corrie Mckusick D.O.   On: 02/21/2016 17:55  Echocardiogram 12/26/2015 ------------------------------------------------------------------- LV EF: 60% - 65%  ------------------------------------------------------------------- History: PMH: Breast Cancer.  ------------------------------------------------------------------- Study Conclusions  - Left ventricle: The cavity size was normal. Wall thickness was  normal. Systolic function was normal. The estimated ejection  fraction was in the range of 60% to 65%. Wall motion was normal;  there were no regional wall motion abnormalities. Features are  consistent with a pseudonormal left ventricular filling pattern,  with concomitant abnormal relaxation and increased filling  pressure (grade 2 diastolic dysfunction). - Left atrium: The atrium was mildly to moderately dilated. - Right ventricle: The cavity size was mildly to moderately  dilated. The RV apex appears heavily trabeculated. - Right atrium: The atrium was moderately dilated. - Impressions: Lateral s&' 11.2 cm/s. GLS -23.9%. THe RV is mild to  moderately dilated with a heavily trabeculated apex. Consider  cMRI to further evaluate.  Impressions:  - Lateral s&' 11.2 cm/s. GLS -23.9%. THe RV is mild to moderately  dilated with a heavily trabeculated apex. Consider cMRI to  further evaluate.  EKG: Independently reviewed. Vent. rate 101 BPM PR interval  184 ms QRS duration 100 ms QT/QTc 325/421 ms P-R-T axes 40 65 41 Sinus tachycardia  Assessment/Plan Principal Problem:   Sepsis due to cellulitis (HCC) Admit to telemetry/inpatient. Continue supplemental oxygen. Continue time-limited IV fluids. Check urinalysis to rule out any other sources. Continue clindamycin and acyclovir to treat herpes zoster. Follow-up blood cultures and sensitivity.  Active Problems:   Herpes zoster Continue acyclovir per pharmacy. Analgesics, anti-histamine as needed.    Kidney stones Check urinalysis.    Prediabetes Carbohydrate modified diet. CBG monitoring.    PAF (paroxysmal atrial fibrillation) (HCC) Resume atenolol and Cardizem once the blood pressure is better. Continue anticoagulation with Xarelto. Optimize electrolytes.  Hypokalemia/hypomagnesemia. Replacing. Monitor potassium level. Check magnesium level and replace as needed.    Essential hypertension Monitor blood pressure closely. Resume antihypertensives once the blood pressure increases.    Breast cancer of upper-outer quadrant of right female breast [facility]) Continue treatment and follow-ups per oncology as an outpatient.      DVT prophylaxis:  Code Status:  Family Communication:  Disposition Plan:  Consults called:  Admission status: Inpatient/telemetry.   Reubin Milan MD [facility] Pager [phone].  If 7PM-7AM, please contact night-coverage [email or web address] Password [email or web address]  02/21/2016, 9:09 PM

## 2016-02-21 NOTE — ED Notes (Signed)
Dr Olevia Bowens aware of bp and bed request changed.

## 2016-02-21 NOTE — Progress Notes (Signed)
Pharmacy Code Sepsis Protocol  Time of code sepsis page: 1636 [x]  Antibiotics delivered at 1650  Were antibiotics ordered at the time of the code sepsis page? Yes Was it required to contact the physician? []  Physician not contacted []  Physician contacted to order antibiotics for code sepsis []  Physician contacted to recommend changing antibiotics  Pharmacy consulted for:   Anti-infectives    Start     Dose/Rate Route Frequency Ordered Stop   02/21/16 1645  clindamycin (CLEOCIN) IVPB 900 mg     900 mg 100 mL/hr over 30 Minutes Intravenous  Once 02/21/16 1636          Nurse education provided: [x]  Minutes left to administer antibiotics to achieve 1 hour goal []  Correct order of antibiotic administration []  Antibiotic Y-site compatibilities     Alycia Rossetti, PharmD, BCPS Clinical Pharmacist Pager: [phone] 02/21/2016 5:16 PM

## 2016-02-21 NOTE — ED Notes (Signed)
Patient transported to X-ray 

## 2016-02-21 NOTE — ED Notes (Signed)
Patient here with multiple complaints. Complains of lower abdominal pain, rash to back, legs weak. Has breast cancer and receiving chemo. Next chemo 5/10. Patient in no distress

## 2016-02-21 NOTE — ED Notes (Signed)
Called [facility] to activate Code Sepsis

## 2016-02-22 DIAGNOSIS — I480 Paroxysmal atrial fibrillation: Secondary | ICD-10-CM

## 2016-02-22 DIAGNOSIS — E876 Hypokalemia: Secondary | ICD-10-CM | POA: Diagnosis present

## 2016-02-22 DIAGNOSIS — B029 Zoster without complications: Secondary | ICD-10-CM

## 2016-02-22 LAB — CBC WITH DIFFERENTIAL/PLATELET
BASOS PCT: 1 %
Basophils Absolute: 0.1 10*3/uL (ref 0.0–0.1)
EOS ABS: 0 10*3/uL (ref 0.0–0.7)
EOS PCT: 0 %
HEMATOCRIT: 25.1 % — AB (ref 36.0–46.0)
HEMOGLOBIN: 8.1 g/dL — AB (ref 12.0–15.0)
LYMPHS PCT: 8 %
Lymphs Abs: 0.4 10*3/uL — ABNORMAL LOW (ref 0.7–4.0)
MCH: 29.1 pg (ref 26.0–34.0)
MCHC: 32.3 g/dL (ref 30.0–36.0)
MCV: 90.3 fL (ref 78.0–100.0)
MONOS PCT: 10 %
Monocytes Absolute: 0.5 10*3/uL (ref 0.1–1.0)
NEUTROS PCT: 81 %
Neutro Abs: 4 10*3/uL (ref 1.7–7.7)
PLATELETS: 93 10*3/uL — AB (ref 150–400)
RBC: 2.78 MIL/uL — ABNORMAL LOW (ref 3.87–5.11)
RDW: 19.4 % — ABNORMAL HIGH (ref 11.5–15.5)
WBC: 5 10*3/uL (ref 4.0–10.5)

## 2016-02-22 LAB — COMPREHENSIVE METABOLIC PANEL
ALK PHOS: 49 U/L (ref 38–126)
ALT: 16 U/L (ref 14–54)
AST: 18 U/L (ref 15–41)
Albumin: 2.1 g/dL — ABNORMAL LOW (ref 3.5–5.0)
Anion gap: 10 (ref 5–15)
CALCIUM: 7.9 mg/dL — AB (ref 8.9–10.3)
CHLORIDE: 109 mmol/L (ref 101–111)
CO2: 21 mmol/L — ABNORMAL LOW (ref 22–32)
CREATININE: 0.69 mg/dL (ref 0.44–1.00)
GFR calc Af Amer: >60 mL/min (ref >60)
Glucose, Bld: 77 mg/dL (ref 65–99)
Potassium: 3.1 mmol/L — ABNORMAL LOW (ref 3.5–5.1)
Sodium: 140 mmol/L (ref 135–145)
TOTAL PROTEIN: 4.4 g/dL — AB (ref 6.5–8.1)
Total Bilirubin: 0.5 mg/dL (ref 0.3–1.2)

## 2016-02-22 MED ORDER — DEXTROSE 5 % IV SOLN
5.0000 mg/kg | Freq: Three times a day (TID) | INTRAVENOUS | Status: DC
  Administered 2016-02-22 – 2016-02-24 (×7): 395 mg via INTRAVENOUS
  Filled 2016-02-22 (×15): qty 7.9

## 2016-02-22 MED ORDER — MAGNESIUM SULFATE 50 % IJ SOLN
6.0000 g | Freq: Once | INTRAMUSCULAR | Status: AC
  Administered 2016-02-22: 6 g via INTRAVENOUS
  Filled 2016-02-22: qty 12

## 2016-02-22 MED ORDER — CLINDAMYCIN PHOSPHATE 900 MG/50ML IV SOLN
900.0000 mg | Freq: Three times a day (TID) | INTRAVENOUS | Status: DC
  Administered 2016-02-22 – 2016-02-24 (×7): 900 mg via INTRAVENOUS
  Filled 2016-02-22 (×9): qty 50

## 2016-02-22 NOTE — Progress Notes (Signed)
Patient ID: [name], female   DOB: 1955-11-21, 60 y.o.   MRN: [id number]                                                                PROGRESS NOTE                                                                                                                                                                                                             Patient Demographics:    [name], is a 60 y.o. female, DOB - 13-Nov-1955, [id number]  Admit date - 02/21/2016   Admitting Physician Reubin Milan, MD  Outpatient Primary MD for the patient is Alesia Richards, MD  LOS - 1  Outpatient Specialists:   Chief Complaint  Patient presents with  . Abdominal Pain  . Rash       Brief Narrative  [name] is a 60 y.o. female with medical history significant of MRSA infection in 2009, eczema, hyperlipidemia, hypertension, urolithiasis, anemia, paroxysmal atrial fibrillation, GERD, breast cancer who comes emergency department with complaints of abdominal pain, skin rash, weakness for several days and fever for 1 day. The patient recently started chemotherapy for her breast cancer. She also complains of decreased appetite and insomnia.   ED Course: In the ER, the patient received IV fluids, IV acyclovir, IV clindamycin, antiemetics, analgesics and states that treatment has provided relief. Workup shows anemia and hypokalemia. Chest radiograph showed atelectasis, but no acute cardiopulmonary pathology. .   Subjective:    [name] feeling better today,  The rash still gives her discomfort on her back. But seems better.  Slight temp this am.     Assessment  & Plan :    Principal Problem:   Sepsis due to cellulitis [facility]) Active Problems:   Kidney stones   Prediabetes   PAF (paroxysmal atrial fibrillation) (HCC)   Essential hypertension   Breast cancer of upper-outer quadrant of right female breast ([place])   Herpes zoster   Hypokalemia    Hypomagnesemia   1. Herpes zoster Cont acyclovir iv  Convert to po acyclovir in the am.   2. Cellulitis Continue clindamycin  3. Pafib Cont rivaroxaban  Code Status : FULL  Disposition Plan  : home tomorrow if afebrile   Consults  :  none  Procedures  : none  DVT Prophylaxis  :  On rivaroxaban  Lab Results  Component Value Date   PLT 93* 02/22/2016    Antibiotics  :  See below  Anti-infectives    Start     Dose/Rate Route Frequency Ordered Stop   02/22/16 1200  clindamycin (CLEOCIN) IVPB 900 mg     900 mg 100 mL/hr over 30 Minutes Intravenous Every 8 hours 02/22/16 1127     02/22/16 1200  acyclovir (ZOVIRAX) 395 mg in dextrose 5 % 100 mL IVPB     5 mg/kg  78.9 kg 107.9 mL/hr over 60 Minutes Intravenous Every 8 hours 02/22/16 1127     02/21/16 1900  acyclovir (ZOVIRAX) 615 mg in dextrose 5 % 100 mL IVPB     10 mg/kg  61.6 kg (Adjusted) 112.3 mL/hr over 60 Minutes Intravenous  Once 02/21/16 1842 02/21/16 2116   02/21/16 1645  clindamycin (CLEOCIN) IVPB 900 mg     900 mg 100 mL/hr over 30 Minutes Intravenous  Once 02/21/16 1636 02/21/16 1742        Objective:   Filed Vitals:   02/21/16 2150 02/21/16 2156 02/21/16 2246 02/22/16 0507  BP: 87/52 95/57 107/61 115/66  Pulse:  91 97 116  Temp:   98.7 F (37.1 C) 102.4 F (39.1 C)  TempSrc:   Oral Oral  Resp: 26 19 18 18   Height:      Weight:      SpO2:  96% 98% 96%    Wt Readings from Last 3 Encounters:  02/21/16 78.926 kg (174 lb)  02/17/16 79.379 kg (175 lb)  02/11/16 78.518 kg (173 lb 1.6 oz)     Intake/Output Summary (Last 24 hours) at 02/22/16 1308 Last data filed at 02/21/16 2116  Gross per 24 hour  Intake   2050 ml  Output      0 ml  Net   2050 ml     Physical Exam  Awake Alert, Oriented X 3, No new F.N deficits, Normal affect Heent: anicteric Supple Neck,No JVD, No cervical lymphadenopathy appriciated.  Symmetrical Chest wall movement, Good air movement bilaterally, CTAB rrrr s1,  s2 ,No Gallops,Rubs or new Murmurs, No Parasternal Heave +ve B.Sounds, Abd Soft, No tenderness, No organomegaly appriciated, No rebound - guarding or rigidity. No Cyanosis, Clubbing or edema, vesciular rash on the left mid back.    CBC  Recent Labs Lab 02/21/16 1612 02/22/16 0402  WBC 6.4 5.0  HGB 9.4* 8.1*  HCT 28.8* 25.1*  PLT 119* 93*  MCV 89.2 90.3  MCH 29.1 29.1  MCHC 32.6 32.3  RDW 18.9* 19.4*  LYMPHSABS 0.3* 0.4*  MONOABS 1.0 0.5  EOSABS 0.0 0.0  BASOSABS 0.1 0.1    Chemistries   Recent Labs Lab 02/21/16 1612 02/22/16 0402  NA 140 140  K 3.3* 3.1*  CL 103 109  CO2 25 21*  GLUCOSE 91 77  BUN <5* <5*  CREATININE 0.69 0.69  CALCIUM 9.4 7.9*  MG 1.2*  --   AST 21 18  ALT 19 16  ALKPHOS 70 49  BILITOT 0.5 0.5   ------------------------------------------------------------------------------------------------------------------ No results for input(s): CHOL, HDL, LDLCALC, TRIG, CHOLHDL, LDLDIRECT in the last 72 hours.  Lab Results  Component Value Date   HGBA1C 6.1* 01/27/2016   ------------------------------------------------------------------------------------------------------------------ No results for input(s): TSH, T4TOTAL, T3FREE, THYROIDAB in the last 72 hours.  Invalid input(s): FREET3 ------------------------------------------------------------------------------------------------------------------ No results for input(s): VITAMINB12, FOLATE, FERRITIN, TIBC, IRON, RETICCTPCT in the last 72  hours.  Coagulation profile No results for input(s): INR, PROTIME in the last 168 hours.  No results for input(s): DDIMER in the last 72 hours.  Cardiac Enzymes No results for input(s): CKMB, TROPONINI, MYOGLOBIN in the last 168 hours.  Invalid input(s): CK ------------------------------------------------------------------------------------------------------------------ No results found for: BNP  Inpatient Medications  Scheduled Meds: . acyclovir  5  mg/kg Intravenous Q8H  . cholecalciferol  4,000 Units Oral Daily  . clindamycin (CLEOCIN) IV  900 mg Intravenous Q8H  . diazepam  5 mg Oral q morning - 10a  . doxepin  50 mg Oral QHS  . ferrous sulfate  325 mg Oral Q breakfast  . montelukast  10 mg Oral QHS  . potassium chloride SA  20 mEq Oral BID  . rivaroxaban  20 mg Oral Q supper  . sodium chloride flush  3 mL Intravenous Q12H  . triamcinolone cream  1 application Topical TID   Continuous Infusions:  PRN Meds:.acetaminophen, clotrimazole-betamethasone, LORazepam, olopatadine, ondansetron **OR** ondansetron (ZOFRAN) IV, oxyCODONE, prochlorperazine  Micro Results Recent Results (from the past 240 hour(s))  Culture, Urine     Status: None   Collection Time: 02/17/16 12:21 PM  Result Value Ref Range Status   Colony Count 25,000 COLONIES/ML  Final   Organism ID, Bacteria Multiple bacterial morphotypes present, none  Final   Organism ID, Bacteria predominant. Suggest appropriate recollection if   Final   Organism ID, Bacteria clinically indicated.  Final    Radiology Reports Dg Chest 2 View  02/21/2016  CLINICAL DATA:  60 year old female with a history of multiple abdominal complaints. EXAM: CHEST - 2 VIEW COMPARISON:  Chest CT 01/30/2016, plain film 12/23/2015 FINDINGS: Cardiomediastinal silhouette unchanged. No evidence of pulmonary vascular congestion. Unchanged position of left chest wall subclavian approach port catheter Low lung volumes. No pneumothorax. No pleural effusion or confluent airspace disease. No displaced fracture. Unremarkable appearance of the upper abdomen. IMPRESSION: Low lung volumes with likely atelectasis and no evidence of superimposed acute cardiopulmonary disease. Signed, Dulcy Fanny. Earleen Newport, DO [facility] Radiology Electronically Signed   By: Corrie Mckusick D.O.   On: 02/21/2016 17:55   Ct Chest W Contrast  01/30/2016  CLINICAL DATA:  Abnormal pulmonary function  test. Undergoing chemotherapy for breast cancer. EXAM: CT CHEST WITH CONTRAST TECHNIQUE: Multidetector CT imaging of the chest was performed during intravenous contrast administration. CONTRAST:  28mL ISOVUE-300 IOPAMIDOL (ISOVUE-300) INJECTION 61% COMPARISON:  Portable chest dated 12/23/2015. Breast MR dated 12/17/2015. FINDINGS: Mediastinum/Lymph Nodes: No masses, pathologically enlarged lymph nodes, or other significant abnormality in mediastinum. The previously demonstrated 1.9 x 1.1 cm metastatic right inferior axillary lymph node currently measures 1.2 x 0.6 cm on image number 23. Left subclavian porta catheter tip in the superior vena cava Lungs/Pleura: 3 mm pleural based nodule at the lateral aspect of the right upper lobe on image number 26. Small amount of linear atelectasis or scarring in the left lower lobe. Minimal patchy interstitial prominence throughout both lungs. Upper abdomen: Left lobe liver cyst. Musculoskeletal: Lower thoracic spine degenerative changes. Other: A right lateral, posterior breast mass is demonstrated containing a biopsy marker clip. This measures 3.0 x 2.6 cm in maximum diameter on image number 28. Previously noted left breast hamartoma. IMPRESSION: 1. Minimal interstitial lung disease. 2. 3 mm pleural-based nodule of the lateral aspect of the right upper lobe. This is not felt to represent a metastasis. No follow-up needed if patient is low-risk for lung carcinoma. Non-contrast chest CT can be considered  in 12 months if patient is high-risk. This recommendation follows the consensus statement: Guidelines for Management of Incidental Pulmonary Nodules Detected on CT Images:From the Fleischner Society 2017; published online before print (10.1148/radiol.IJ:2314499). 3. Known malignancy in the right breast. 4. Interval decrease in size of a metastatic right axillary node compatible with chemotherapy response. Electronically Signed   By: Claudie Revering M.D.   On: 01/30/2016 17:11     Time Spent in minutes  30   Jani Gravel M.D on 02/22/2016 at 1:08 PM  Between 7am to 7pm - Pager - [phone]  After 7pm go to [email or web address] - password [facility]  [facility] -  Office  [phone]

## 2016-02-22 NOTE — Progress Notes (Signed)
Pharmacy Antibiotic Note  [name] is a 60 y.o. female admitted on 02/21/2016 with cellulitis.  Pharmacy has been consulted for acyclovir dosing.  Iantha came in with weakness due to skin rashes. She was being r/o for sepsis. It was also suspected that she may have herpes zosters. Empiric clinda/acyclovir was ordered. She received the dose in the ED but no cont doses. Dr. Maudie Mercury has ordered both now.   Plan:  Clindamycin 900mg  IV q8 Acyclovir 395mg  IV q8  Height: 5\' 2"  (157.5 cm) Weight: 174 lb (78.926 kg) IBW/kg (Calculated) : 50.1  Temp (24hrs), Avg:100.5 F (38.1 C), Min:98.7 F (37.1 C), Max:102.4 F (39.1 C)   Recent Labs Lab 02/21/16 1612 02/21/16 1647 02/21/16 1852 02/22/16 0402  WBC 6.4  --   --  5.0  CREATININE 0.69  --   --  0.69  LATICACIDVEN  --  1.68 1.51  --     Estimated Creatinine Clearance: 71.8 mL/min (by C-G formula based on Cr of 0.69).    Allergies  Allergen Reactions  . Penicillins Other (See Comments)         Antimicrobials this admission: 5/6 clindamycin >>  5/6 acyclovir >>   Dose adjustments this admission:   Microbiology results: 5/6 BCx:  5/6 UCx:     Thank you for allowing pharmacy to be a part of this patient's care.  Onnie Boer, PharmD Pager: [phone] 02/22/2016 11:32 AM

## 2016-02-23 ENCOUNTER — Telehealth: Payer: Self-pay | Admitting: *Deleted

## 2016-02-23 DIAGNOSIS — I10 Essential (primary) hypertension: Secondary | ICD-10-CM

## 2016-02-23 LAB — COMPREHENSIVE METABOLIC PANEL
ALBUMIN: 2.3 g/dL — AB (ref 3.5–5.0)
ALT: 17 U/L (ref 14–54)
AST: 26 U/L (ref 15–41)
Alkaline Phosphatase: 56 U/L (ref 38–126)
Anion gap: 10 (ref 5–15)
BILIRUBIN TOTAL: 0.3 mg/dL (ref 0.3–1.2)
CHLORIDE: 102 mmol/L (ref 101–111)
CO2: 24 mmol/L (ref 22–32)
CREATININE: 0.74 mg/dL (ref 0.44–1.00)
Calcium: 7.5 mg/dL — ABNORMAL LOW (ref 8.9–10.3)
GFR calc Af Amer: >60 mL/min (ref >60)
GFR calc non Af Amer: >60 mL/min (ref >60)
GLUCOSE: 90 mg/dL (ref 65–99)
POTASSIUM: 3.5 mmol/L (ref 3.5–5.1)
Sodium: 136 mmol/L (ref 135–145)
Total Protein: 4.5 g/dL — ABNORMAL LOW (ref 6.5–8.1)

## 2016-02-23 LAB — CBC WITH DIFFERENTIAL/PLATELET
BASOS PCT: 1 %
Basophils Absolute: 0.1 10*3/uL (ref 0.0–0.1)
EOS PCT: 1 %
Eosinophils Absolute: 0.1 10*3/uL (ref 0.0–0.7)
HEMATOCRIT: 26.5 % — AB (ref 36.0–46.0)
Hemoglobin: 8.6 g/dL — ABNORMAL LOW (ref 12.0–15.0)
LYMPHS PCT: 9 %
Lymphs Abs: 0.5 10*3/uL — ABNORMAL LOW (ref 0.7–4.0)
MCH: 28.7 pg (ref 26.0–34.0)
MCHC: 32.5 g/dL (ref 30.0–36.0)
MCV: 88.3 fL (ref 78.0–100.0)
MONO ABS: 1.6 10*3/uL — AB (ref 0.1–1.0)
MONOS PCT: 27 %
NEUTROS PCT: 62 %
Neutro Abs: 3.8 10*3/uL (ref 1.7–7.7)
Platelets: 105 10*3/uL — ABNORMAL LOW (ref 150–400)
RBC: 3 MIL/uL — AB (ref 3.87–5.11)
RDW: 19.8 % — AB (ref 11.5–15.5)
WBC: 6.1 10*3/uL (ref 4.0–10.5)

## 2016-02-23 LAB — URINE CULTURE: CULTURE: NO GROWTH

## 2016-02-23 LAB — MAGNESIUM: Magnesium: 1.4 mg/dL — ABNORMAL LOW (ref 1.7–2.4)

## 2016-02-23 MED ORDER — ONDANSETRON HCL 4 MG PO TABS
4.0000 mg | ORAL_TABLET | Freq: Every day | ORAL | Status: DC
  Administered 2016-02-24: 4 mg via ORAL
  Filled 2016-02-23: qty 1

## 2016-02-23 MED ORDER — ONDANSETRON HCL 4 MG PO TABS: 4.0000 mg | ORAL_TABLET | ORAL | Status: DC

## 2016-02-23 MED ORDER — GABAPENTIN 300 MG PO CAPS
300.0000 mg | ORAL_CAPSULE | Freq: Two times a day (BID) | ORAL | Status: DC
  Administered 2016-02-23 – 2016-02-24 (×3): 300 mg via ORAL
  Filled 2016-02-23 (×3): qty 1

## 2016-02-23 MED ORDER — ATENOLOL 50 MG PO TABS
25.0000 mg | ORAL_TABLET | Freq: Every day | ORAL | Status: DC
  Administered 2016-02-23 – 2016-02-24 (×2): 25 mg via ORAL
  Filled 2016-02-23 (×2): qty 1

## 2016-02-23 NOTE — Progress Notes (Signed)
Received report from [facility] from call received on 02/21/16.  Pt's daughter questioning new rash on her mother's back after exposure to sister with shingles.  Pt was instructed to go to the ED where she was worked up for sepis/shingles and is currently admitted.  Lindi Adie, MD notified.

## 2016-02-23 NOTE — Consult Note (Signed)
WOC wound consult note Reason for Consult: groin  Daughter reports lesions in the groin that started at her last admission to the hospital.  She has been using a "cream" at home in the morning and night.  Patient states "it started when I lay with stool on my skin".   Wound type: MASD (moisture associated skin damage) Wound MD:[id number] skin peeling in the upper inner thighs and labia majora.  Three lesions noted but they are not fluctuant, no drainage.  Not tender  Drainage (amount, consistency, odor) none Periwound: intact  Dressing procedure/placement/frequency: Criticaid clear to protect area from further injury. Explained rationale of cream to the patient and her daughter.   Discussed POC with patient and bedside nurse.  Re consult if needed, will not follow at this time. Thanks  Declan Adamson Kell[place], [place] [phone])

## 2016-02-23 NOTE — Progress Notes (Signed)
PROGRESS NOTE        PATIENT DETAILS Name: [name] Age: 60 y.o. Sex: female Date of Birth: Nov 10, 1955 Admit Date: 02/21/2016 Admitting Physician Reubin Milan, MD [id number] DAVID, MD Outpatient Specialists:Dr Lindi Adie, Dr Meda Coffee  Brief Narrative: Patient is a 60 y.o. female who was recently diagnosed with breast cancer undergoing chemotherapy presented with fever and herpes zoster infection involving left upper back and extending to the left breast area.  Subjective: Febrile last evening-still with vesicles in the left upper back extending into the left lateral breast area. Vomited early this morning-claims she needs antiemetics every morning.   Assessment/Plan: Principal Problem: Sepsis due to back cellulitis superimposed on herpes zoster: Continue clindamycin and IV acyclovir. Blood cultures/urine culture/chest x-ray negative so far. Sepsis pathophysiology has resolved, continues to have some lingering fever-does not look acutely sick. Still has some vesicles present-complains of neuropathic pain-we will start Neurontin. Follow  Active Problems: Paroxysmal atrial fibrillation:CHADS2-VASc=2-continue Xarelto. Atenolol and Cardizem on hold due to soft blood pressure-will slowly resume.  Hypertension: Blood pressure currently controlled without the use of any antihypertensives-resume diuretics, atenolol and Cardizem when able.  Anemia with thrombocytopenia: Suspect secondary to chemotherapy, continue to monitor.  Hypokalemia/hypomagnesemia: Repleted-recheck magnesium today.  History of right breast cancer: Getting chemotherapy-last chemotherapy approximately 2 weeks back. Continue outpatient follow-up with oncology on discharge.  Nausea with vomiting: Likely related to cancer/chemotherapy-per patient she takes antiemetics every morning-Will start scheduled Zofran every morning. No abdominal pain or distention noted on exam.  Skin  excoriation to bilateral groin area: Occurred few weeks back when she had vomiting and diarrhea-some mild superficial ulceration-but no deep infection suspected. Wound care evaluation.  DVT Prophylaxis: Full dose anticoagulation with Xarelto  Code Status: Full code  Family Communication: None at bedside  Disposition Plan: Remain inpatient-home when afebrile  Antimicrobial agents: IV Clindamycine 5/6>> IV Acyclovir 5/6>>  Procedures: None  CONSULTS:  None  Time spent: 25 minutes-Greater than 50% of this time was spent in counseling, explanation of diagnosis, planning of further management, and coordination of care.  MEDICATIONS: Anti-infectives    Start     Dose/Rate Route Frequency Ordered Stop   02/22/16 1200  clindamycin (CLEOCIN) IVPB 900 mg     900 mg 100 mL/hr over 30 Minutes Intravenous Every 8 hours 02/22/16 1127     02/22/16 1200  acyclovir (ZOVIRAX) 395 mg in dextrose 5 % 100 mL IVPB     5 mg/kg  78.9 kg 107.9 mL/hr over 60 Minutes Intravenous Every 8 hours 02/22/16 1127     02/21/16 1900  acyclovir (ZOVIRAX) 615 mg in dextrose 5 % 100 mL IVPB     10 mg/kg  61.6 kg (Adjusted) 112.3 mL/hr over 60 Minutes Intravenous  Once 02/21/16 1842 02/21/16 2116   02/21/16 1645  clindamycin (CLEOCIN) IVPB 900 mg     900 mg 100 mL/hr over 30 Minutes Intravenous  Once 02/21/16 1636 02/21/16 1742      Scheduled Meds: . acyclovir  5 mg/kg Intravenous Q8H  . cholecalciferol  4,000 Units Oral Daily  . clindamycin (CLEOCIN) IV  900 mg Intravenous Q8H  . diazepam  5 mg Oral q morning - 10a  . doxepin  50 mg Oral QHS  . ferrous sulfate  325 mg Oral Q breakfast  . montelukast  10 mg Oral QHS  . potassium chloride SA  20  mEq Oral BID  . rivaroxaban  20 mg Oral Q supper  . sodium chloride flush  3 mL Intravenous Q12H  . triamcinolone cream  1 application Topical TID   Continuous Infusions:  PRN Meds:.acetaminophen, clotrimazole-betamethasone, LORazepam, olopatadine,  ondansetron **OR** ondansetron (ZOFRAN) IV, oxyCODONE, prochlorperazine   PHYSICAL EXAM: Vital signs: Filed Vitals:   02/22/16 1323 02/22/16 1900 02/22/16 2109 02/23/16 0510  BP: 133/60  89/58 108/60  Pulse: 108  108 112  Temp: 100.8 F (38.2 C) 101 F (38.3 C) 98.4 F (36.9 C) 99.4 F (37.4 C)  TempSrc:  Oral Oral Oral  Resp: 16   16  Height:      Weight:      SpO2: 98%  95% 95%   Filed Weights   02/21/16 1602 02/21/16 1808  Weight: 79.379 kg (175 lb) 78.926 kg (174 lb)   Body mass index is 31.82 kg/(m^2).   Gen Exam: Awake and alert with clear speech. Not in any distress  Neck: Supple, No JVD.  Chest: B/L Clear.   CVS: S1 S2 Regular, no murmurs.  Abdomen: soft, BS +, non tender, non distended.  Extremities: no edema, lower extremities warm to touch Neurologic: Non Focal.   Skin: see photographs below Wounds: N/A.         LABORATORY DATA: CBC:  Recent Labs Lab 02/21/16 1612 02/22/16 0402 02/23/16 0329  WBC 6.4 5.0 6.1  NEUTROABS 5.0 4.0 3.8  HGB 9.4* 8.1* 8.6*  HCT 28.8* 25.1* 26.5*  MCV 89.2 90.3 88.3  PLT 119* 93* 105*    Basic Metabolic Panel:  Recent Labs Lab 02/21/16 1612 02/22/16 0402 02/23/16 0329  NA 140 140 136  K 3.3* 3.1* 3.5  CL 103 109 102  CO2 25 21* 24  GLUCOSE 91 77 90  BUN <5* <5* <5*  CREATININE 0.69 0.69 0.74  CALCIUM 9.4 7.9* 7.5*  MG 1.2*  --   --     GFR: Estimated Creatinine Clearance: 71.8 mL/min (by C-G formula based on Cr of 0.74).  Liver Function Tests:  Recent Labs Lab 02/21/16 1612 02/22/16 0402 02/23/16 0329  AST 21 18 26   ALT 19 16 17   ALKPHOS 70 49 56  BILITOT 0.5 0.5 0.3  PROT 5.6* 4.4* 4.5*  ALBUMIN 2.8* 2.1* 2.3*   No results for input(s): LIPASE, AMYLASE in the last 168 hours. No results for input(s): AMMONIA in the last 168 hours.  Coagulation Profile: No results for input(s): INR, PROTIME in the last 168 hours.  Cardiac Enzymes: No results for input(s): CKTOTAL, CKMB, CKMBINDEX,  TROPONINI in the last 168 hours.  BNP (last 3 results) No results for input(s): PROBNP in the last 8760 hours.  HbA1C: No results for input(s): HGBA1C in the last 72 hours.  CBG: No results for input(s): GLUCAP in the last 168 hours.  Lipid Profile: No results for input(s): CHOL, HDL, LDLCALC, TRIG, CHOLHDL, LDLDIRECT in the last 72 hours.  Thyroid Function Tests: No results for input(s): TSH, T4TOTAL, FREET4, T3FREE, THYROIDAB in the last 72 hours.  Anemia Panel: No results for input(s): VITAMINB12, FOLATE, FERRITIN, TIBC, IRON, RETICCTPCT in the last 72 hours.  Urine analysis:    Component Value Date/Time   COLORURINE YELLOW 02/21/2016 2140   APPEARANCEUR CLOUDY* 02/21/2016 2140   LABSPEC 1.019 02/21/2016 2140   PHURINE 6.0 02/21/2016 2140   GLUCOSEU NEGATIVE 02/21/2016 2140   HGBUR LARGE* 02/21/2016 2140   BILIRUBINUR NEGATIVE 02/21/2016 2140   [place] 02/21/2016 2140   PROTEINUR 30*  02/21/2016 2140   UROBILINOGEN 0.2 08/03/2015 0725   NITRITE NEGATIVE 02/21/2016 2140   LEUKOCYTESUR NEGATIVE 02/21/2016 2140    Sepsis Labs: Lactic Acid, Venous    Component Value Date/Time   LATICACIDVEN 1.51 02/21/2016 1852    MICROBIOLOGY: Recent Results (from the past 240 hour(s))  Culture, Urine     Status: None   Collection Time: 02/17/16 12:21 PM  Result Value Ref Range Status   Colony Count 25,000 COLONIES/ML  Final   Organism ID, Bacteria Multiple bacterial morphotypes present, none  Final   Organism ID, Bacteria predominant. Suggest appropriate recollection if   Final   Organism ID, Bacteria clinically indicated.  Final  Blood Culture (routine x 2)     Status: None (Preliminary result)   Collection Time: 02/21/16  4:19 PM  Result Value Ref Range Status   Specimen Description BLOOD RIGHT ANTECUBITAL  Final   Special Requests BOTTLES DRAWN AEROBIC AND ANAEROBIC 5CC  Final   Culture NO GROWTH 1 DAY  Final   Report Status PENDING  Incomplete  Blood Culture  (routine x 2)     Status: None (Preliminary result)   Collection Time: 02/21/16  4:52 PM  Result Value Ref Range Status   Specimen Description BLOOD RIGHT ANTECUBITAL  Final   Special Requests IN PEDIATRIC BOTTLE 2CC  Final   Culture NO GROWTH 1 DAY  Final   Report Status PENDING  Incomplete  Urine culture     Status: None (Preliminary result)   Collection Time: 02/21/16  9:40 PM  Result Value Ref Range Status   Specimen Description URINE, CATHETERIZED  Final   Special Requests NONE  Final   Culture NO GROWTH < 24 HOURS  Final   Report Status PENDING  Incomplete    RADIOLOGY STUDIES/RESULTS: Dg Chest 2 View  02/21/2016  CLINICAL DATA:  60 year old female with a history of multiple abdominal complaints. EXAM: CHEST - 2 VIEW COMPARISON:  Chest CT 01/30/2016, plain film 12/23/2015 FINDINGS: Cardiomediastinal silhouette unchanged. No evidence of pulmonary vascular congestion. Unchanged position of left chest wall subclavian approach port catheter Low lung volumes. No pneumothorax. No pleural effusion or confluent airspace disease. No displaced fracture. Unremarkable appearance of the upper abdomen. IMPRESSION: Low lung volumes with likely atelectasis and no evidence of superimposed acute cardiopulmonary disease. Signed, Dulcy Fanny. Earleen Newport, DO [facility] Radiology Electronically Signed   By: Corrie Mckusick D.O.   On: 02/21/2016 17:55   Ct Chest W Contrast  01/30/2016  CLINICAL DATA:  Abnormal pulmonary function test. Undergoing chemotherapy for breast cancer. EXAM: CT CHEST WITH CONTRAST TECHNIQUE: Multidetector CT imaging of the chest was performed during intravenous contrast administration. CONTRAST:  1mL ISOVUE-300 IOPAMIDOL (ISOVUE-300) INJECTION 61% COMPARISON:  Portable chest dated 12/23/2015. Breast MR dated 12/17/2015. FINDINGS: Mediastinum/Lymph Nodes: No masses, pathologically enlarged lymph nodes, or other significant abnormality in  mediastinum. The previously demonstrated 1.9 x 1.1 cm metastatic right inferior axillary lymph node currently measures 1.2 x 0.6 cm on image number 23. Left subclavian porta catheter tip in the superior vena cava Lungs/Pleura: 3 mm pleural based nodule at the lateral aspect of the right upper lobe on image number 26. Small amount of linear atelectasis or scarring in the left lower lobe. Minimal patchy interstitial prominence throughout both lungs. Upper abdomen: Left lobe liver cyst. Musculoskeletal: Lower thoracic spine degenerative changes. Other: A right lateral, posterior breast mass is demonstrated containing a biopsy marker clip. This measures 3.0 x 2.6 cm in maximum diameter on  image number 28. Previously noted left breast hamartoma. IMPRESSION: 1. Minimal interstitial lung disease. 2. 3 mm pleural-based nodule of the lateral aspect of the right upper lobe. This is not felt to represent a metastasis. No follow-up needed if patient is low-risk for lung carcinoma. Non-contrast chest CT can be considered in 12 months if patient is high-risk. This recommendation follows the consensus statement: Guidelines for Management of Incidental Pulmonary Nodules Detected on CT Images:From the Fleischner Society 2017; published online before print (10.1148/radiol.SG:5268862). 3. Known malignancy in the right breast. 4. Interval decrease in size of a metastatic right axillary node compatible with chemotherapy response. Electronically Signed   By: Claudie Revering M.D.   On: 01/30/2016 17:11     LOS: 2 days   Oren Binet, MD  [facility] Pager:[phone]  If 7PM-7AM, please contact night-coverage [email or web address] Password [email or web address] 02/23/2016, 10:12 AM

## 2016-02-23 NOTE — Telephone Encounter (Signed)
"  Daughter Lauralyn Primes called reporting mom is hospitalized.  Will not be here for treatment this week but may receive chemotherapy in hospital once she improves.  She has a fever of 104.5."

## 2016-02-24 ENCOUNTER — Other Ambulatory Visit: Payer: Self-pay

## 2016-02-24 ENCOUNTER — Telehealth: Payer: Self-pay

## 2016-02-24 DIAGNOSIS — E876 Hypokalemia: Secondary | ICD-10-CM

## 2016-02-24 MED ORDER — TRIAMCINOLONE ACETONIDE 0.1 % EX CREA: 1.0000 "application " | TOPICAL_CREAM | Freq: Three times a day (TID) | CUTANEOUS | Status: DC

## 2016-02-24 MED ORDER — VALACYCLOVIR HCL 1 G PO TABS: 1000.0000 mg | ORAL_TABLET | Freq: Three times a day (TID) | ORAL | Status: DC

## 2016-02-24 MED ORDER — GABAPENTIN 300 MG PO CAPS: 300.0000 mg | ORAL_CAPSULE | Freq: Two times a day (BID) | ORAL | Status: DC

## 2016-02-24 MED ORDER — ATENOLOL 50 MG PO TABS: 50.0000 mg | ORAL_TABLET | Freq: Every day | ORAL | Status: DC

## 2016-02-24 MED ORDER — MAGNESIUM SULFATE 2 GM/50ML IV SOLN
2.0000 g | Freq: Once | INTRAVENOUS | Status: DC
  Filled 2016-02-24 (×3): qty 50

## 2016-02-24 NOTE — Progress Notes (Signed)
PT Cancellation Note  Patient Details Name: [name] MRN: [id number] DOB: 1956/06/12   Cancelled Treatment:    Reason Eval/Treat Not Completed: PT screened, no needs identified, will sign off, per RN, mobilizing without difficulties. Is  To DC today.   [name] 02/24/2016, 12:03 PM Tresa Endo PT [phone]

## 2016-02-24 NOTE — Telephone Encounter (Signed)
Called pt daughter to alleviate any confusion regarding her mother's appointments.  Informed pt's daughter she was scheduled for labs and a follow up appointment with Dr. Lindi Adie on 5/11.  Pt's daughter verbalized understanding and has no further questions or concerns at this time.

## 2016-02-24 NOTE — Telephone Encounter (Signed)
"  This is Theadore Nan calling to let you all know my mom has been discharged.  She wants to make an appointment to see Dr. Lindi Adie but was told she may not receive chemotherapy because she has shingles."  She will keep tomorrow's lab and F/U appointment to discuss next treatment steps.

## 2016-02-24 NOTE — Care Management Note (Signed)
Case Management Note  Patient Details  Name: [name] MRN: [id number] Date of Birth: 1956-02-21  Subjective/Objective:                 Admitted with sepsis   Action/Plan: Spoke with patient about discharge plan, gave her choice for home health. She selected [facility]. Contacted Susan at [place] and set up [facility]. Patient has family to assist her after discharge.   Expected Discharge Date:                  Expected Discharge Plan:  [place]  In-House Referral:  NA  Discharge planning Services  CM Consult  Post Acute Care Choice:  Home Health Choice offered to:  Patient  DME Arranged:  N/A DME Agency:     HH Arranged:  RN [place] Agency:  [place]  Status of Service:  Completed, signed off  Medicare Important Message Given:    Date Medicare IM Given:    Medicare IM give by:    Date Additional Medicare IM Given:    Additional Medicare Important Message give by:     If discussed at [place] of Stay Meetings, dates discussed:    Additional Comments:  Nila Nephew, RN 02/24/2016, 12:24 PM

## 2016-02-24 NOTE — Progress Notes (Signed)
Reviewed discharge papers and medication information with total understanding. [name]

## 2016-02-24 NOTE — Progress Notes (Signed)
Feels much better Rash appears unchanged-except some more blistering (see pics taken today-below) Wants to go home Fever curve much better Stable for discharge Spoke with Dr Lindi Adie over the phone-he will arrange for follow up

## 2016-02-24 NOTE — Discharge Summary (Signed)
PATIENT DETAILS Name: [name] Age: 60 y.o. Sex: female Date of Birth: 03-20-1956 MRN: [id number]. Admitting Physician: Reubin Milan, MD [id number] DAVID, MD  Admit Date: 02/21/2016 Discharge date: 02/24/2016  Recommendations for Outpatient Follow-up:  1. New medication-Valtrex and Neurontin 2. Ensure follow-up with oncology  3. Please repeat CBC/BMET at next visit 4. Please follow blood cultures till final  PRIMARY DISCHARGE DIAGNOSIS:  Principal Problem:   Sepsis due to cellulitis [facility]) Active Problems:   Kidney stones   Prediabetes   PAF (paroxysmal atrial fibrillation) (HCC)   Essential hypertension   Breast cancer of upper-outer quadrant of right female breast ([place])   Herpes zoster   Hypokalemia   Hypomagnesemia      PAST MEDICAL HISTORY: Past Medical History  Diagnosis Date  . MRSA (methicillin resistant staph aureus) culture positive 2009     none since per patient  . Eczema   . Hypercholesteremia   . Hypertension   . Kidney stones   . Anemia   . Vitamin D deficiency   . Paroxysmal atrial fibrillation ([place]) 10/2011, 06/2015  . GERD (gastroesophageal reflux disease)   . Atrial fibrillation ([place]) 2016  . Dysrhythmia     A FIB - followed by Dr. Meda Coffee  . Breast cancer of upper-outer quadrant of right female breast ([place]) 12/12/2015  . Breast cancer ([place])   . Complication of anesthesia 10-06-15     slow to awaken after   . PONV (postoperative nausea and vomiting) 10-06-15    admitted back to hospital for dehydration    DISCHARGE MEDICATIONS: Current Discharge Medication List    START taking these medications   Details  gabapentin (NEURONTIN) 300 MG capsule Take 1 capsule (300 mg total) by mouth 2 (two) times daily. Qty: 60 capsule, Refills: 0    valACYclovir (VALTREX) 1000 MG tablet Take 1 tablet (1,000 mg total) by mouth 3 (three) times daily. Qty: 12 tablet, Refills: 0      CONTINUE these medications which have CHANGED   Details  triamcinolone cream (KENALOG) 0.1 % Apply 1 application topically 3 (three) times daily. Qty: 80 g, Refills: 0      CONTINUE these medications which have NOT CHANGED   Details  acetaminophen (TYLENOL) 500 MG tablet Take 1,000 mg by mouth every 6 (six) hours as needed for moderate pain.    atenolol (TENORMIN) 100 MG tablet take 1/2 to 1 tablet by mouth once daily as directed Qty: 90 tablet, Refills: 1    cholecalciferol (VITAMIN D) 1000 UNITS tablet Take 4,000 Units by mouth daily.    clotrimazole-betamethasone (LOTRISONE) cream Apply to affected area 2 times daily Qty: 45 g, Refills: 1    dexamethasone (DECADRON) 4 MG tablet Take 1 tablet (4 mg total) by mouth See admin instructions. Take 1 tablets by mouth once a day on the day after chemotherapy and then take 1 tablets two times a day for 2 days. Take with  food. Chemotherapy on Thursdays. Qty: 30 tablet, Refills: 1   Associated Diagnoses: Breast cancer of upper-outer quadrant of right female breast (HCC)    diazepam (VALIUM) 5 MG tablet Take 5 mg by mouth every morning.     diltiazem (CARDIZEM CD) 120 MG 24 hr capsule Take 1 capsule (120 mg total) by mouth daily. Qty: 90 capsule, Refills: 3    doxepin (SINEQUAN) 50 MG capsule take 1 capsule by mouth once daily at bedtime Qty: 30 capsule, Refills: 1    ferrous sulfate 325 (65 FE) MG  tablet Take 325 mg by mouth daily with breakfast.    lidocaine-prilocaine (EMLA) cream Apply to affected area once Qty: 30 g, Refills: 3   Associated Diagnoses: Breast cancer of upper-outer quadrant of right female breast (HCC)    LORazepam (ATIVAN) 0.5 MG tablet Take 1 tablet (0.5 mg total) by mouth every 6 (six) hours as needed (nausea). Qty: 30 tablet, Refills: 0   Associated Diagnoses: Insomnia    montelukast (SINGULAIR) 10 MG tablet Take 1 tablet (10 mg total) by mouth daily. Qty: 30 tablet, Refills: 2    ondansetron (ZOFRAN) 8 MG tablet Take 1 tablet (8 mg total) by mouth 2  (two) times daily as needed (Nausea or vomiting). Qty: 30 tablet, Refills: 1   Associated Diagnoses: Breast cancer of upper-outer quadrant of right female breast ([place])    oxyCODONE-acetaminophen (PERCOCET/ROXICET) 5-325 MG tablet Take 1 tablet by mouth every 12 (twelve) hours as needed for severe pain. Qty: 30 tablet, Refills: 0   Associated Diagnoses: Breast cancer of upper-outer quadrant of right female breast (HCC)    PAZEO 0.7 % SOLN Place 1 drop into both eyes daily as needed (allergies).     potassium chloride SA (K-DUR,KLOR-CON) 20 MEQ tablet Take 20 mEq by mouth 2 (two) times daily.    prochlorperazine (COMPAZINE) 10 MG tablet Take 1 tablet (10 mg total) by mouth every 6 (six) hours as needed (Nausea or vomiting). Qty: 30 tablet, Refills: 1   Associated Diagnoses: Breast cancer of upper-outer quadrant of right female breast (HCC)    rivaroxaban (XARELTO) 20 MG TABS tablet Take 1 tablet (20 mg total) by mouth daily with supper. Qty: 90 tablet, Refills: 3      STOP taking these medications     triamterene-hydrochlorothiazide (MAXZIDE-25) 37.5-25 MG tablet      cephALEXin (KEFLEX) 500 MG capsule      predniSONE (DELTASONE) 20 MG tablet         ALLERGIES:   Allergies  Allergen Reactions  . Penicillins Other (See Comments)         BRIEF HPI:  See H&P, Labs, Consult and Test reports for all details in brief, Patient is a 60 y.o. female who was recently diagnosed with breast cancer undergoing chemotherapy presented with fever and herpes zoster infection involving left upper back and extending to the left breast area.  CONSULTATIONS:   None  PERTINENT RADIOLOGIC STUDIES: Dg Chest 2 View  02/21/2016  CLINICAL DATA:  60 year old female with a history of multiple abdominal complaints. EXAM: CHEST - 2 VIEW COMPARISON:  Chest CT 01/30/2016, plain film 12/23/2015 FINDINGS: Cardiomediastinal silhouette unchanged. No evidence of pulmonary vascular congestion. Unchanged position  of left chest wall subclavian approach port catheter Low lung volumes. No pneumothorax. No pleural effusion or confluent airspace disease. No displaced fracture. Unremarkable appearance of the upper abdomen. IMPRESSION: Low lung volumes with likely atelectasis and no evidence of superimposed acute cardiopulmonary disease. Signed, Dulcy Fanny. Earleen Newport, DO [facility] Radiology Electronically Signed   By: Corrie Mckusick D.O.   On: 02/21/2016 17:55   Ct Chest W Contrast  01/30/2016  CLINICAL DATA:  Abnormal pulmonary function test. Undergoing chemotherapy for breast cancer. EXAM: CT CHEST WITH CONTRAST TECHNIQUE: Multidetector CT imaging of the chest was performed during intravenous contrast administration. CONTRAST:  52mL ISOVUE-300 IOPAMIDOL (ISOVUE-300) INJECTION 61% COMPARISON:  Portable chest dated 12/23/2015. Breast MR dated 12/17/2015. FINDINGS: Mediastinum/Lymph Nodes: No masses, pathologically enlarged lymph nodes, or other significant abnormality in mediastinum. The previously demonstrated  1.9 x 1.1 cm metastatic right inferior axillary lymph node currently measures 1.2 x 0.6 cm on image number 23. Left subclavian porta catheter tip in the superior vena cava Lungs/Pleura: 3 mm pleural based nodule at the lateral aspect of the right upper lobe on image number 26. Small amount of linear atelectasis or scarring in the left lower lobe. Minimal patchy interstitial prominence throughout both lungs. Upper abdomen: Left lobe liver cyst. Musculoskeletal: Lower thoracic spine degenerative changes. Other: A right lateral, posterior breast mass is demonstrated containing a biopsy marker clip. This measures 3.0 x 2.6 cm in maximum diameter on image number 28. Previously noted left breast hamartoma. IMPRESSION: 1. Minimal interstitial lung disease. 2. 3 mm pleural-based nodule of the lateral aspect of the right upper lobe. This is not felt to represent a metastasis. No  follow-up needed if patient is low-risk for lung carcinoma. Non-contrast chest CT can be considered in 12 months if patient is high-risk. This recommendation follows the consensus statement: Guidelines for Management of Incidental Pulmonary Nodules Detected on CT Images:From the Fleischner Society 2017; published online before print (10.1148/radiol.SG:5268862). 3. Known malignancy in the right breast. 4. Interval decrease in size of a metastatic right axillary node compatible with chemotherapy response. Electronically Signed   By: Claudie Revering M.D.   On: 01/30/2016 17:11     PERTINENT LAB RESULTS: CBC:  Recent Labs  02/22/16 0402 02/23/16 0329  WBC 5.0 6.1  HGB 8.1* 8.6*  HCT 25.1* 26.5*  PLT 93* 105*   CMET CMP     Component Value Date/Time   NA 136 02/23/2016 0329   NA 140 02/11/2016 0743   K 3.5 02/23/2016 0329   K 3.9 02/11/2016 0743   CL 102 02/23/2016 0329   CO2 24 02/23/2016 0329   CO2 23 02/11/2016 0743   GLUCOSE 90 02/23/2016 0329   GLUCOSE 95 02/11/2016 0743   BUN <5* 02/23/2016 0329   BUN 16.0 02/11/2016 0743   CREATININE 0.74 02/23/2016 0329   CREATININE 0.8 02/11/2016 0743   CREATININE 0.56 01/27/2016 0953   CALCIUM 7.5* 02/23/2016 0329   CALCIUM 9.7 02/11/2016 0743   PROT 4.5* 02/23/2016 0329   PROT 6.3* 02/11/2016 0743   ALBUMIN 2.3* 02/23/2016 0329   ALBUMIN 3.8 02/11/2016 0743   AST 26 02/23/2016 0329   AST 16 02/11/2016 0743   ALT 17 02/23/2016 0329   ALT 33 02/11/2016 0743   ALKPHOS 56 02/23/2016 0329   ALKPHOS 52 02/11/2016 0743   BILITOT 0.3 02/23/2016 0329   BILITOT 0.38 02/11/2016 0743   GFRNONAA >60 02/23/2016 0329   GFRNONAA >89 01/27/2016 0953   GFRAA >60 02/23/2016 0329   GFRAA >89 01/27/2016 0953    GFR Estimated Creatinine Clearance: 71.8 mL/min (by C-G formula based on Cr of 0.74). No results for input(s): LIPASE, AMYLASE in the last 72 hours. No results for input(s): CKTOTAL, CKMB, CKMBINDEX, TROPONINI in the last 72  hours. Invalid input(s): POCBNP No results for input(s): DDIMER in the last 72 hours. No results for input(s): HGBA1C in the last 72 hours. No results for input(s): CHOL, HDL, LDLCALC, TRIG, CHOLHDL, LDLDIRECT in the last 72 hours. No results for input(s): TSH, T4TOTAL, T3FREE, THYROIDAB in the last 72 hours.  Invalid input(s): FREET3 No results for input(s): VITAMINB12, FOLATE, FERRITIN, TIBC, IRON, RETICCTPCT in the last 72 hours. Coags: No results for input(s): INR in the last 72 hours.  Invalid input(s): PT Microbiology: Recent Results (from the past 240 hour(s))  Culture, Urine  Status: None   Collection Time: 02/17/16 12:21 PM  Result Value Ref Range Status   Colony Count 25,000 COLONIES/ML  Final   Organism ID, Bacteria Multiple bacterial morphotypes present, none  Final   Organism ID, Bacteria predominant. Suggest appropriate recollection if   Final   Organism ID, Bacteria clinically indicated.  Final  Blood Culture (routine x 2)     Status: None (Preliminary result)   Collection Time: 02/21/16  4:19 PM  Result Value Ref Range Status   Specimen Description BLOOD RIGHT ANTECUBITAL  Final   Special Requests BOTTLES DRAWN AEROBIC AND ANAEROBIC 5CC  Final   Culture NO GROWTH 2 DAYS  Final   Report Status PENDING  Incomplete  Blood Culture (routine x 2)     Status: None (Preliminary result)   Collection Time: 02/21/16  4:52 PM  Result Value Ref Range Status   Specimen Description BLOOD RIGHT ANTECUBITAL  Final   Special Requests IN PEDIATRIC BOTTLE 2CC  Final   Culture NO GROWTH 2 DAYS  Final   Report Status PENDING  Incomplete  Urine culture     Status: None   Collection Time: 02/21/16  9:40 PM  Result Value Ref Range Status   Specimen Description URINE, CATHETERIZED  Final   Special Requests NONE  Final   Culture NO GROWTH 2 DAYS  Final   Report Status 02/23/2016 FINAL  Final     BRIEF HOSPITAL COURSE:  Sepsis due to back cellulitis superimposed on herpes  zoster: Started on IV clindamycin and acyclovir. Much improved with complete resolution of sepsis pathophysiology. Zoster rash appears essentially the same, no further fever, she looks good and wants to go home today. Will transition to oral Valtrex, continue Neurontin for pain and discharged home today. Since no evidence of cellulitis on my exam today, I do not think she needs any further antibiotics. She will follow-up with her oncologist for further close monitoring.  Active Problems: Paroxysmal atrial fibrillation:CHADS2-VASc=2-continue Xarelto. Atenolol and Cardizem were on hold due to soft blood pressures, atenolol restarted, she has started to move around-blood pressure is stable-suspect we could resume Cardizem as well.   Hypertension: Blood pressure currently with just atenolol, suspect that she starts ablating more blood pressure will start increasing-hence we will resume Cardizem. We will continue to hold HCTZ/triamterene-please reassess at next visit. Please continue to optimize antihypertensive regimen  Anemia with thrombocytopenia: Suspect secondary to chemotherapy, continue to monitor closely in the outpatient setting  Hypokalemia/hypomagnesemia: Repleted.  History of right breast cancer: Getting chemotherapy-last chemotherapy approximately 2 weeks back. Continue outpatient follow-up with oncology on discharge.  Nausea with vomiting: Likely related to cancer/chemotherapy-no vomiting this morning. Continue scheduled antiemetic that she usually takes every morning.  Skin excoriation to bilateral groin area: Occurred few weeks back when she had vomiting and diarrhea-some mild superficial ulceration-but no deep infection suspected. Wound care evaluation completed, appreciate recommendations.  TODAY-DAY OF DISCHARGE:  Subjective:   Suzie Portela today has no headache,no chest abdominal pain,no new weakness tingling or numbness, feels much better wants to go home today.   Objective:    Blood pressure 108/58, pulse 124, temperature 99.7 F (37.6 C), temperature source Oral, resp. rate 16, height 5\' 2"  (1.575 m), weight 78.926 kg (174 lb), SpO2 95 %.  Intake/Output Summary (Last 24 hours) at 02/24/16 1151 Last data filed at 02/24/16 0600  Gross per 24 hour  Intake    540 ml  Output      0 ml  Net  540 ml   Filed Weights   02/21/16 1602 02/21/16 1808  Weight: 79.379 kg (175 lb) 78.926 kg (174 lb)    Exam Awake Alert, Oriented *3, No new F.N deficits, Normal affect [place].AT,PERRAL Supple Neck,No JVD, No cervical lymphadenopathy appriciated.  Symmetrical Chest wall movement, Good air movement bilaterally, CTAB RRR,No Gallops,Rubs or new Murmurs, No Parasternal Heave +ve B.Sounds, Abd Soft, Non tender, No organomegaly appriciated, No rebound -guarding or rigidity. No Cyanosis, Clubbing or edema, No new Rash or bruise  DISCHARGE CONDITION: Stable  DISPOSITION: Home with home health RN  DISCHARGE INSTRUCTIONS:    Activity:  As tolerated with Full fall precautions use walker/cane & assistance as needed  Get Medicines reviewed and adjusted: Please take all your medications with you for your next visit with your Primary MD  Please request your Primary MD to go over all hospital tests and procedure/radiological results at the follow up, please ask your Primary MD to get all Hospital records sent to his/her office.  If you experience worsening of your admission symptoms, develop shortness of breath, life threatening emergency, suicidal or homicidal thoughts you must seek medical attention immediately by calling 911 or calling your MD immediately  if symptoms less severe.  You must read complete instructions/literature along with all the possible adverse reactions/side effects for all the Medicines you take and that have been prescribed to you. Take any new Medicines after you have completely understood and accpet all the possible adverse reactions/side effects.    Do not drive when taking Pain medications.   Do not take more than prescribed Pain, Sleep and Anxiety Medications  Special Instructions: If you have smoked or chewed Tobacco  in the last 2 yrs please stop smoking, stop any regular Alcohol  and or any Recreational drug use.  Wear Seat belts while driving.  Please note  You were cared for by a hospitalist during your hospital stay. Once you are discharged, your primary care physician will handle any further medical issues. Please note that NO REFILLS for any discharge medications will be authorized once you are discharged, as it is imperative that you return to your primary care physician (or establish a relationship with a primary care physician if you do not have one) for your aftercare needs so that they can reassess your need for medications and monitor your lab values.  Diet recommendation: Heart Healthy diet  Discharge Instructions    Call MD for:  persistant nausea and vomiting    Complete by:  As directed      Call MD for:  redness, tenderness, or signs of infection (pain, swelling, redness, odor or green/yellow discharge around incision site)    Complete by:  As directed      Diet - low sodium heart healthy    Complete by:  As directed      Increase activity slowly    Complete by:  As directed            Follow-up Information    Follow up with Alesia Richards, MD. Schedule an appointment as soon as possible for a visit in 1 week.   Specialty:  Internal Medicine   Contact information:   [address] [place] [address] [phone]       Follow up with Rulon Eisenmenger, MD.   Specialty:  Hematology and Oncology   Why:  Office will call with date/time, If you do not hear from them in 1 week,please giv   Contact information:  [address] [place] [address] [id number]      Total Time spent on discharge equals 45 minutes.  SignedOren Binet 02/24/2016 11:51 AM

## 2016-02-25 ENCOUNTER — Ambulatory Visit: Payer: BC Managed Care – PPO

## 2016-02-25 ENCOUNTER — Ambulatory Visit: Payer: BC Managed Care – PPO | Admitting: Hematology and Oncology

## 2016-02-25 ENCOUNTER — Encounter: Payer: Self-pay | Admitting: Internal Medicine

## 2016-02-25 ENCOUNTER — Other Ambulatory Visit: Payer: BC Managed Care – PPO

## 2016-02-25 NOTE — Telephone Encounter (Signed)
Collaborative called patient and daughter.  F/U rescheduled for 02-26-2016.

## 2016-02-26 ENCOUNTER — Other Ambulatory Visit: Payer: Self-pay

## 2016-02-26 ENCOUNTER — Encounter: Payer: Self-pay | Admitting: Genetic Counselor

## 2016-02-26 ENCOUNTER — Ambulatory Visit (HOSPITAL_BASED_OUTPATIENT_CLINIC_OR_DEPARTMENT_OTHER): Payer: BC Managed Care – PPO | Admitting: Hematology and Oncology

## 2016-02-26 ENCOUNTER — Other Ambulatory Visit (HOSPITAL_BASED_OUTPATIENT_CLINIC_OR_DEPARTMENT_OTHER): Payer: BC Managed Care – PPO

## 2016-02-26 ENCOUNTER — Encounter: Payer: Self-pay | Admitting: Hematology and Oncology

## 2016-02-26 ENCOUNTER — Telehealth: Payer: Self-pay | Admitting: Hematology and Oncology

## 2016-02-26 VITALS — BP 117/68 | HR 121 | Temp 98.6°F | Resp 18 | Ht 62.0 in | Wt 179.7 lb

## 2016-02-26 DIAGNOSIS — D701 Agranulocytosis secondary to cancer chemotherapy: Secondary | ICD-10-CM

## 2016-02-26 DIAGNOSIS — C50411 Malignant neoplasm of upper-outer quadrant of right female breast: Secondary | ICD-10-CM

## 2016-02-26 DIAGNOSIS — D6959 Other secondary thrombocytopenia: Secondary | ICD-10-CM | POA: Diagnosis not present

## 2016-02-26 DIAGNOSIS — C773 Secondary and unspecified malignant neoplasm of axilla and upper limb lymph nodes: Secondary | ICD-10-CM | POA: Diagnosis not present

## 2016-02-26 LAB — CBC WITH DIFFERENTIAL/PLATELET
BASO%: 1.2 % (ref 0.0–2.0)
BASOS ABS: 0.1 10*3/uL (ref 0.0–0.1)
EOS%: 0.2 % (ref 0.0–7.0)
Eosinophils Absolute: 0 10*3/uL (ref 0.0–0.5)
HEMATOCRIT: 24.9 % — AB (ref 34.8–46.6)
HGB: 7.9 g/dL — ABNORMAL LOW (ref 11.6–15.9)
LYMPH#: 1.6 10*3/uL (ref 0.9–3.3)
LYMPH%: 15.3 % (ref 14.0–49.7)
MCH: 27.7 pg (ref 25.1–34.0)
MCHC: 31.7 g/dL (ref 31.5–36.0)
MCV: 87.4 fL (ref 79.5–101.0)
MONO#: 1.9 10*3/uL — AB (ref 0.1–0.9)
MONO%: 17.6 % — ABNORMAL HIGH (ref 0.0–14.0)
NEUT#: 7 10*3/uL — ABNORMAL HIGH (ref 1.5–6.5)
NEUT%: 65.7 % (ref 38.4–76.8)
PLATELETS: 416 10*3/uL — AB (ref 145–400)
RBC: 2.85 10*6/uL — ABNORMAL LOW (ref 3.70–5.45)
RDW: 20.2 % — ABNORMAL HIGH (ref 11.2–14.5)
WBC: 10.6 10*3/uL — ABNORMAL HIGH (ref 3.9–10.3)

## 2016-02-26 LAB — COMPREHENSIVE METABOLIC PANEL
ALT: 18 U/L (ref 0–55)
AST: 24 U/L (ref 5–34)
Albumin: 2.6 g/dL — ABNORMAL LOW (ref 3.5–5.0)
Alkaline Phosphatase: 77 U/L (ref 40–150)
Anion Gap: 8 mEq/L (ref 3–11)
BUN: 7.7 mg/dL (ref 7.0–26.0)
CALCIUM: 8.7 mg/dL (ref 8.4–10.4)
CHLORIDE: 102 meq/L (ref 98–109)
CO2: 26 mEq/L (ref 22–29)
CREATININE: 0.7 mg/dL (ref 0.6–1.1)
EGFR: >90 mL/min/{1.73_m2} (ref >90)
GLUCOSE: 85 mg/dL (ref 70–140)
POTASSIUM: 3.6 meq/L (ref 3.5–5.1)
Sodium: 137 mEq/L (ref 136–145)
Total Bilirubin: <0.3 mg/dL (ref 0.20–1.20)
Total Protein: 5.2 g/dL — ABNORMAL LOW (ref 6.4–8.3)

## 2016-02-26 LAB — CULTURE, BLOOD (ROUTINE X 2)
CULTURE: NO GROWTH
Culture: NO GROWTH

## 2016-02-26 MED ORDER — MORPHINE SULFATE ER 15 MG PO TBCR: 15.0000 mg | EXTENDED_RELEASE_TABLET | Freq: Two times a day (BID) | ORAL | Status: DC

## 2016-02-26 MED ORDER — SODIUM CHLORIDE 0.9 % IJ SOLN
10.0000 mL | INTRAMUSCULAR | Status: DC | PRN
Start: 2016-02-26 — End: 2016-02-26
  Administered 2016-02-26: 10 mL via INTRAVENOUS
  Filled 2016-02-26: qty 10

## 2016-02-26 MED ORDER — HEPARIN SOD (PORK) LOCK FLUSH 100 UNIT/ML IV SOLN
500.0000 [IU] | Freq: Once | INTRAVENOUS | Status: AC | PRN
  Administered 2016-02-26: 500 [IU] via INTRAVENOUS
  Filled 2016-02-26: qty 5

## 2016-02-26 NOTE — Telephone Encounter (Signed)
appt made and avs printed. Pt received information to call and schedule mri at breast center

## 2016-02-26 NOTE — Progress Notes (Signed)
Patient Care Team: Unk Pinto, MD as PCP - General (Internal Medicine) Warden Fillers, MD as Consulting Physician (Ophthalmology) Kathie Rhodes, MD as Consulting Physician (Urology) Danella Sensing, MD as Consulting Physician (Dermatology) Alfonso Patten, RN as Registered Nurse Dorothy Spark, MD as Consulting Physician (Cardiology) Sylvan Cheese, NP as Nurse Practitioner (Hematology and Oncology)  DIAGNOSIS: Breast cancer of upper-outer quadrant of right female breast [facility])   Staging form: Breast, AJCC 7th Edition     Clinical stage from 12/17/2015: Stage IIA (T2, N0, M0) - Unsigned       Staging comments: Staged at breast conference on 3.1.17  SUMMARY OF ONCOLOGIC HISTORY:   Breast cancer of upper-outer quadrant of right female breast ([place])   12/05/2015 Mammogram Right breast mammogram and ultrasound revealed 2.8 x 2.7 x 2.1 irregular hypoechoic mass at 10:00 position, few mildly prominent right extrarenal lymph nodes are identified but unchanged since 2008   12/10/2015 Initial Diagnosis Right breast biopsy 10:00 position: 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 Differential diagnosis complete replacement of lymph node versus direct extension   01/19/2016 - 01/22/2016 Hospital Admission Hospitalization for abdominal pain, diarrhea, nausea and vomiting   02/21/2016 - 02/24/2016 Hospital Admission Hospitalization for herpes zoster and sepsis due to cellulitis    CHIEF COMPLIANT: Follow-up after recent hospitalization for zoster and  sepsis  INTERVAL HISTORY: [name] is a 60 year old with above-mentioned history of right breast cancer who is currently neoadjuvant chemotherapy. She was hospitalized twice over the past month most recently for herpes zoster involving the chest wall into the breast. She is on Bactrim yesterday recovering from it. The cellulitis that she had in the groin area has healed completely. She continues to remain relatively frail and is using a walker to get around. She does not have any further issues with nausea and vomiting. She feels quite frail from the effects of prior chemotherapy.  REVIEW OF SYSTEMS:   Constitutional: Denies fevers, chills or abnormal weight loss Eyes: Denies blurriness of vision Ears, nose, mouth, throat, and face: Denies mucositis or sore throat Respiratory: Denies cough, dyspnea or wheezes Cardiovascular: Denies palpitation, chest discomfort Gastrointestinal:  Denies nausea, heartburn or change in bowel habits Skin: Herpes zoster involving the chest wall into the left breast. Lymphatics: Denies new lymphadenopathy or easy bruising Neurological:Denies numbness, tingling or new weaknesses Behavioral/Psych: Mood is stable, no new changes  Extremities: No lower extremity edema Breast: Zoster All other systems were reviewed with the patient and are negative.  I have reviewed the past medical history, past surgical history, social history and family history with the patient and they are unchanged from previous note.  ALLERGIES:  is allergic to penicillins.  MEDICATIONS:  Current Outpatient Prescriptions  Medication Sig Dispense Refill  . acetaminophen (TYLENOL) 500 MG tablet Take 1,000 mg by mouth every 6 (six) hours as needed for moderate pain.    Marland Kitchen atenolol (TENORMIN) 100 MG tablet take 1/2 to 1 tablet by mouth once daily as directed (Patient taking differently: takes 1 tab by mouth once daily) 90  tablet 1  . cholecalciferol (VITAMIN D) 1000 UNITS tablet Take  4,000 Units by mouth daily.    . clotrimazole-betamethasone (LOTRISONE) cream Apply to affected area 2 times daily (Patient taking differently: Apply 1 application topically 2 (two) times daily as needed (for irritation). Apply to affected area 2 times daily) 45 g 1  . dexamethasone (DECADRON) 4 MG tablet Take 1 tablet (4 mg total) by mouth See admin instructions. Take 1 tablets by mouth once a day on the day after chemotherapy and then take 1 tablets two times a day for 2 days. Take with  food. Chemotherapy on Thursdays. 30 tablet 1  . diazepam (VALIUM) 5 MG tablet Take 5 mg by mouth every morning.     . diltiazem (CARDIZEM CD) 120 MG 24 hr capsule Take 1 capsule (120 mg total) by mouth daily. 90 capsule 3  . doxepin (SINEQUAN) 50 MG capsule take 1 capsule by mouth once daily at bedtime 30 capsule 1  . ferrous sulfate 325 (65 FE) MG tablet Take 325 mg by mouth daily with breakfast.    . gabapentin (NEURONTIN) 300 MG capsule Take 1 capsule (300 mg total) by mouth 2 (two) times daily. 60 capsule 0  . LORazepam (ATIVAN) 0.5 MG tablet Take 1 tablet (0.5 mg total) by mouth every 6 (six) hours as needed (nausea). 30 tablet 0  . montelukast (SINGULAIR) 10 MG tablet Take 1 tablet (10 mg total) by mouth daily. (Patient taking differently: Take 10 mg by mouth at bedtime. ) 30 tablet 2  . morphine (MS CONTIN) 15 MG 12 hr tablet Take 1 tablet (15 mg total) by mouth every 12 (twelve) hours. 60 tablet 0  . oxyCODONE-acetaminophen (PERCOCET/ROXICET) 5-325 MG tablet Take 1 tablet by mouth every 12 (twelve) hours as needed for severe pain. 30 tablet 0  . PAZEO 0.7 % SOLN Place 1 drop into both eyes daily as needed (allergies).     . potassium chloride SA (K-DUR,KLOR-CON) 20 MEQ tablet Take 20 mEq by mouth 2 (two) times daily.    . prochlorperazine (COMPAZINE) 10 MG tablet Take 1 tablet (10 mg total) by mouth every 6 (six) hours as needed (Nausea or vomiting). 30 tablet 1  . rivaroxaban (XARELTO) 20 MG TABS tablet  Take 1 tablet (20 mg total) by mouth daily with supper. 90 tablet 3  . triamcinolone cream (KENALOG) 0.1 % Apply 1 application topically 3 (three) times daily. 80 g 0  . valACYclovir (VALTREX) 1000 MG tablet Take 1 tablet (1,000 mg total) by mouth 3 (three) times daily. 12 tablet 0   Current Facility-Administered Medications  Medication Dose Route Frequency Provider Last Rate Last Dose  . sodium chloride 0.9 % injection 10 mL  10 mL Intravenous PRN Nicholas Lose, MD   10 mL at 02/26/16 1250    PHYSICAL EXAMINATION: ECOG PERFORMANCE STATUS: 1 - Symptomatic but completely ambulatory  Filed Vitals:   02/26/16 1245  BP: 117/68  Pulse: 121  Temp: 98.6 F (37 C)  Resp: 18   Filed Weights   02/26/16 1245  Weight: 179 lb 11.2 oz (81.511 kg)    GENERAL:alert, no distress and comfortable SKIN: skin color, texture, turgor are normal, no rashes or significant lesions EYES: normal, Conjunctiva are pink and non-injected, sclera clear OROPHARYNX:no exudate, no erythema and lips, buccal mucosa, and tongue normal  NECK: supple, thyroid normal size, non-tender, without nodularity LYMPH:  no palpable lymphadenopathy in the cervical, axillary or inguinal LUNGS: clear to auscultation and percussion with normal  breathing effort HEART: regular rate & rhythm and no murmurs and no lower extremity edema ABDOMEN:abdomen soft, non-tender and normal bowel sounds MUSCULOSKELETAL:no cyanosis of digits and no clubbing  NEURO: alert & oriented x 3 with fluent speech, no focal motor/sensory deficits EXTREMITIES: No lower extremity edema BREAST: Herpes zoster infection appears to be healing well. There is a small area of open sore for which she is applying Neosporin.. (exam performed in the presence of a chaperone)  LABORATORY DATA:  I have reviewed the data as listed   Chemistry      Component Value Date/Time   NA 137 02/26/2016 1232   NA 136 02/23/2016 0329   K 3.6 02/26/2016 1232   K 3.5 02/23/2016  0329   CL 102 02/23/2016 0329   CO2 26 02/26/2016 1232   CO2 24 02/23/2016 0329   BUN 7.7 02/26/2016 1232   BUN <5* 02/23/2016 0329   CREATININE 0.7 02/26/2016 1232   CREATININE 0.74 02/23/2016 0329   CREATININE 0.56 01/27/2016 0953      Component Value Date/Time   CALCIUM 8.7 02/26/2016 1232   CALCIUM 7.5* 02/23/2016 0329   ALKPHOS 77 02/26/2016 1232   ALKPHOS 56 02/23/2016 0329   AST 24 02/26/2016 1232   AST 26 02/23/2016 0329   ALT 18 02/26/2016 1232   ALT 17 02/23/2016 0329   BILITOT <0.30 02/26/2016 1232   BILITOT 0.3 02/23/2016 0329       Lab Results  Component Value Date   WBC 10.6* 02/26/2016   HGB 7.9* 02/26/2016   HCT 24.9* 02/26/2016   MCV 87.4 02/26/2016   PLT 416* 02/26/2016   NEUTROABS 7.0* 02/26/2016     ASSESSMENT & PLAN:  Breast cancer of upper-outer quadrant of right female breast ([place]) Right breast biopsy 12/10/2015: 10:00 position: 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 Genetics consultation 2. Breast MRI 12/18/15::Rt Breast: 3.2 cm X 2.9x2.4 cm ring enh mass with central necrosis; 1.9 x 1.1 cm Rt Axill LN, Stable 3 cm left breast hamartoma 3. Neoadjuvant chemotherapy with dose dense Adriamycin Cytoxan 4 followed by Abraxane weekly 12 4. Followed by breast conserving surgery 5. Followed by adjuvant radiation therapy -------------------------------------------------------------------------------------------------------------------------- Current treatment: Completed 4 cycles of  Dose dense AC  ECHO 12/26/15: EF 60-65%  Chemotherapy  toxicities: Denies any nausea or vomiting, denies any fatigue, denies any weakness, denies any fevers or chills. 1. Neutropenia: Expected for nadir count. I provided her with neutropenic precautions. I will not change chemotherapy dosage. 2. Mild thrombocytopenia: Platelets 100 3. Hospitalization for 01/20/2016 for abdominal pain, nausea, vomiting and diarrhea 4. Wound between her thighs: Klebsiella on culture treated with antibiotic, it has healed completely. 5. Hospitalization for sepsis and herpes zoster 02/21/2016 on Valtrex and Neurontin  I discussed with her that continuation of further chemotherapy could continue to put her risk for poor wound healing as well as recurrent infections and I recommended discontinuation of further chemotherapy. We will obtain a breast MRI next week in follow-up a week after and present in the tumor board. I will contact Dr. Barry Dienes to see her for surgery.  Return to clinic on May  24 to follow-up on breast MRI in tumor board discussion.   Orders Placed This Encounter  Procedures  . MR Breast Bilateral W Wo Contrast    Standing Status: Future     Number of Occurrences:      Standing Expiration Date: 04/27/2017    Order Specific Question:  If indicated for the ordered procedure, I authorize the administration of contrast media per Radiology protocol    Answer:  Yes    Order Specific Question:  Reason for Exam (SYMPTOM  OR DIAGNOSIS REQUIRED)    Answer:  Breast cancer    Order Specific Question:  Preferred imaging location?    Answer:  [id number]    Order Specific Question:  Does the patient have a pacemaker or implanted devices?    Answer:  No    Order Specific Question:  What is the patient's sedation requirement?    Answer:  No Sedation   The patient has a good understanding of the overall plan. she agrees with it. she will call with any problems that may develop before the next visit here.   Rulon Eisenmenger, MD 02/26/2016

## 2016-02-26 NOTE — Assessment & Plan Note (Signed)
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 Genetics consultation 2. Breast MRI 12/18/15::Rt Breast: 3.2 cm X 2.9x2.4 cm ring enh mass with central necrosis; 1.9 x 1.1 cm Rt Axill LN, Stable 3 cm left breast hamartoma 3. Neoadjuvant chemotherapy with dose dense Adriamycin Cytoxan 4 followed by Abraxane weekly 12 4. Followed by breast conserving surgery 5. Followed by adjuvant radiation therapy -------------------------------------------------------------------------------------------------------------------------- Current treatment: Completed 4 cycles of  Dose dense AC  ECHO 12/26/15: EF 60-65%  Chemotherapy toxicities: Denies any nausea or vomiting, denies any fatigue, denies any weakness, denies any fevers or chills. 1. Neutropenia: Expected for nadir count. I provided her with neutropenic precautions. I will not change chemotherapy dosage. 2. Mild thrombocytopenia: Platelets 100 3. Hospitalization for 01/20/2016 for abdominal pain, nausea, vomiting and diarrhea 4. Wound between her thighs: Klebsiella on culture treated with antibiotic, continues to be a problem. I will consult wound care to evaluate her. 5. Hospitalization for sepsis and herpes zoster 02/21/2016 on Valtrex and Neurontin  I discussed with her that continuation of further chemotherapy could continue to put her risk for poor wound healing as well as recurrent infections and I recommended discontinuation of further chemotherapy.

## 2016-02-27 ENCOUNTER — Encounter: Payer: Self-pay | Admitting: Internal Medicine

## 2016-02-27 DIAGNOSIS — I480 Paroxysmal atrial fibrillation: Secondary | ICD-10-CM | POA: Diagnosis not present

## 2016-02-27 DIAGNOSIS — I10 Essential (primary) hypertension: Secondary | ICD-10-CM | POA: Diagnosis not present

## 2016-02-27 DIAGNOSIS — B028 Zoster with other complications: Secondary | ICD-10-CM | POA: Diagnosis not present

## 2016-02-27 DIAGNOSIS — L03312 Cellulitis of back [any part except buttock]: Secondary | ICD-10-CM | POA: Diagnosis not present

## 2016-03-01 ENCOUNTER — Other Ambulatory Visit: Payer: Self-pay | Admitting: Internal Medicine

## 2016-03-01 MED ORDER — FLUCONAZOLE 150 MG PO TABS: 150.0000 mg | ORAL_TABLET | Freq: Once | ORAL | Status: DC

## 2016-03-03 ENCOUNTER — Ambulatory Visit (HOSPITAL_BASED_OUTPATIENT_CLINIC_OR_DEPARTMENT_OTHER): Payer: BC Managed Care – PPO | Admitting: Genetic Counselor

## 2016-03-03 ENCOUNTER — Other Ambulatory Visit: Payer: BC Managed Care – PPO

## 2016-03-03 ENCOUNTER — Ambulatory Visit: Payer: BC Managed Care – PPO

## 2016-03-03 ENCOUNTER — Encounter: Payer: Self-pay | Admitting: Genetic Counselor

## 2016-03-03 DIAGNOSIS — C50411 Malignant neoplasm of upper-outer quadrant of right female breast: Secondary | ICD-10-CM | POA: Diagnosis not present

## 2016-03-03 DIAGNOSIS — Z315 Encounter for genetic counseling: Secondary | ICD-10-CM

## 2016-03-03 DIAGNOSIS — Z8042 Family history of malignant neoplasm of prostate: Secondary | ICD-10-CM

## 2016-03-03 DIAGNOSIS — Z803 Family history of malignant neoplasm of breast: Secondary | ICD-10-CM | POA: Diagnosis not present

## 2016-03-03 NOTE — Progress Notes (Signed)
REFERRING PROVIDER: Unk Pinto, MD [place], [place] [address]   Nicholas Lose, MD  PRIMARY PROVIDER:  Alesia Richards, MD  PRIMARY REASON FOR VISIT:  1. Breast cancer of upper-outer quadrant of right female breast ([place])   2. Family history of breast cancer   3. Family history of prostate cancer      HISTORY OF PRESENT ILLNESS:   Ms. Flippo, a 60 y.o. female, was seen for a [place] cancer genetics consultation at the request of Dr. Lindi Adie due to a personal and family history of cancer.  Ms. Lyles presents to clinic today to discuss the possibility of a hereditary predisposition to cancer, genetic testing, and to further clarify her future cancer risks, as well as potential cancer risks for family members.   In 2017, at the age of 45, Ms. Buzzell was diagnosed with invasive ductal carcinoma of the breast. 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 This was treated with chemotherapy.  They are stopping chemo early because she is not tolerating it well.  She will get an MRI on Friday, and then they want to decide upon a unilateral or bilateral mastectomy based on genetic test results.    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 Differential diagnosis complete replacement of lymph node versus direct extension   01/19/2016 - 01/22/2016 Hospital Admission Hospitalization for abdominal pain, diarrhea, nausea and vomiting   02/21/2016 - 02/24/2016 Hospital Admission Hospitalization for herpes zoster and sepsis due to cellulitis     HORMONAL RISK FACTORS:  Menarche was at age 32.  First live birth at age 49.  OCP use for approximately 4 years.  Ovaries intact: yes.  Hysterectomy: no.  Menopausal status: postmenopausal.  HRT use: 0 years. Colonoscopy: no; not examined. Mammogram within the last year: yes. Number of breast biopsies: 2. Up to date with pelvic exams:  yes. Any excessive radiation exposure in the past:  no  Past Medical History  Diagnosis Date  . MRSA (methicillin resistant staph aureus) culture positive 2009     none since per patient  . Eczema   . Hypercholesteremia   . Hypertension   . Kidney stones   . Anemia   . Vitamin D deficiency   . Paroxysmal atrial fibrillation ([place]) 10/2011, 06/2015  . GERD (gastroesophageal reflux disease)   . Atrial fibrillation ([place]) 2016  . Dysrhythmia     A FIB - followed by Dr. Meda Coffee  . Breast cancer of upper-outer quadrant of right female breast ([place]) 12/12/2015  . Breast cancer ([place])   . Complication of anesthesia 10-06-15     slow to awaken after   . PONV (postoperative nausea and vomiting) 10-06-15    admitted back to  hospital for dehydration  . Family history of breast cancer   . Family history of prostate cancer     Past Surgical History  Procedure Laterality Date  . Breast surgery Left     biopsy-neg  . Kidney stone surgery  2013  . Nephrolithotomy Right 10/06/2015    Procedure: NEPHROLITHOTOMY PERCUTANEOUS;  Surgeon: Kathie Rhodes, MD;  Location: [facility];  Service: Urology;  Laterality: Right;  . Cystoscopy with holmium laser lithotripsy Right 10/06/2015    Procedure:  CYSTOSCOPY WITH HOLMIUM LASER LITHOTRIPSY;  Surgeon: Kathie Rhodes, MD;  Location: [facility];  Service: Urology;  Laterality: Right;  . Portacath placement Left 12/23/2015    Procedure: INSERTION PORT-A-CATH WITH [place];  Surgeon: Stark Klein, MD;  Location: [facility];  Service: General;  Laterality: Left;    Social History   Social History  . Marital Status: Widowed    Spouse Name: N/A  . Number of Children: 2  . Years of Education: N/A   Social History Main Topics  . Smoking status: Never Smoker   . Smokeless tobacco: Never Used  . Alcohol Use: No  . Drug Use: No  . Sexual Activity: Not Asked   Other Topics Concern  . None   Social History Narrative     FAMILY HISTORY:  We obtained a detailed, 4-generation family history.  Significant diagnoses are listed below: Family History  Problem Relation Age of Onset  . Hypertension Mother   . Hepatitis C Mother   . Heart disease Father   . Dementia Father   . Aneurysm Brother   . Breast cancer Paternal Aunt     dx in her 35s  . Prostate cancer Paternal Uncle     74s  . Prostate cancer Paternal Uncle     dx in his 42s  . Leukemia Paternal Aunt   . Breast cancer Cousin     mother's paternal first cousin  . Thyroid cancer Cousin     mother's paternal first cousin    The patient has two daughters who are cancerfree.  She has two full sisters and three full brothers, a maternal half brother and a paternal half sister who is [place] and they have never met.  A full brother has been diagnosed with prostate cancer.  The patient's father died of Alzheimer's disease.  He had three brothers and three sisters.  Two brothers had prostate cancer in their 14s-70s and one sister had breast cancer in her 53s.  Her mother is alive at 22.  She had four sisters and two brothers.  A maternal first cousin has either Stage IV kidney disease, or kidney cancer.  There is a distant relative with breast cancer and another with thyroid cancer.  Patient's maternal  ancestors are of African American descent, and paternal ancestors are of [place] American and Caucasian descent. There is no reported Ashkenazi Jewish ancestry. There is no known consanguinity.  GENETIC COUNSELING ASSESSMENT: ELSY CHIANG is a 60 y.o. female with a personal and family history of cancer which is somewhat suggestive of a hereditary cancer syndrome and predisposition to cancer. We, therefore, discussed and recommended the following at today's visit.   DISCUSSION: We discussed that about 5-10% of breast cancer is due to hereditary cancer syndromes, most commonly BRCA mutations.  PALB2 mutations can also be attributed to triple negative breast cancer and therefore is in the differential diagnosis for the cancer in her family.  We reviewed the characteristics, features and inheritance patterns of hereditary cancer syndromes.  We also discussed genetic testing, including the appropriate family members to test, the process of testing, insurance coverage and turn-around-time for results. We discussed the implications of a negative, positive and/or variant of uncertain significant result. We recommended Ms. Daft pursue genetic testing for the Breast/Ovarian cancer gene panel. The Breast/Ovarian gene panel offered by [facility] includes sequencing and rearrangement analysis for the following 20 genes:  ATM, BARD1, BRCA1, BRCA2, BRIP1, CDH1, CHEK2, EPCAM, FANCC, MLH1, MSH2, MSH6, NBN, PALB2, PMS2, PTEN, RAD51C, RAD51D, TP53, and XRCC2.     Based on Ms. Ekstrand's personal and family history of cancer, she meets medical criteria for genetic testing. Despite that she meets criteria, she may still have an out of pocket cost. We discussed that if her out of pocket cost for testing is over $100, the laboratory will call and confirm whether she wants to proceed with testing.  If the out of pocket cost of testing is less than $100 she will be billed by the genetic testing laboratory.   PLAN: After  considering the risks, benefits, and limitations, Ms. Mathia  provided informed consent to pursue genetic testing and the blood sample was sent to [facility] for analysis of the Breast/Ovarian cancer panel. The results have been placed on a [facility] and therefore should be available within approximately 2-3 weeks' time, but hopefully closer to 2 weeks.  When they come back they will be disclosed by telephone to Ms. Gao, as will any additional recommendations warranted by these results. Ms. Grosshans will receive a summary of her genetic counseling visit and a copy of her results once available. This information will also be available in Epic. We encouraged Ms. Sheard to remain in contact with cancer genetics annually so that we can continuously update the family history and inform her of any changes in cancer genetics and testing that may be of benefit for her family. Ms. Gulotta questions were answered to her satisfaction today. Our contact information was provided should additional questions or concerns arise.  Lastly, we encouraged Ms. Pontarelli to remain in contact with cancer genetics annually so that we can continuously update the family history and inform her of any changes in cancer genetics and testing that may be of benefit for this family.   Ms.  Winkels questions were answered to her satisfaction today. Our contact information was provided should additional questions or concerns arise. Thank you for the referral and allowing [place] to share in the care of your patient.   Karen P. Florene Glen, [place], [facility] Certified Genetic Counselor [email or web address] phone: [phone]  The patient was seen for a total of 45 minutes in face-to-face genetic counseling.  This patient was discussed with Drs. Magrinat, Lindi Adie and/or Burr Medico who agrees with the above.    _______________________________________________________________________ For Office Staff:  Number of people involved in session: 2 Was  an Intern/ student involved with case: no

## 2016-03-05 ENCOUNTER — Ambulatory Visit
Admission: RE | Admit: 2016-03-05 | Discharge: 2016-03-05 | Disposition: A | Discharge status: Home / Self Care | Payer: BC Managed Care – PPO | Source: Ambulatory Visit | Attending: Hematology and Oncology | Admitting: Hematology and Oncology

## 2016-03-05 DIAGNOSIS — C50411 Malignant neoplasm of upper-outer quadrant of right female breast: Secondary | ICD-10-CM

## 2016-03-05 MED ORDER — GADOBENATE DIMEGLUMINE 529 MG/ML IV SOLN
15.0000 mL | Freq: Once | INTRAVENOUS | Status: AC | PRN
  Administered 2016-03-05: 15 mL via INTRAVENOUS

## 2016-03-08 ENCOUNTER — Telehealth: Payer: Self-pay | Admitting: *Deleted

## 2016-03-08 NOTE — Telephone Encounter (Signed)
Discussed with Dr. Lindi Adie. Called and left VMM for [name]. Advised for patient to take 800 mg of Motrin at noon everyday as well as every 12 hours as needed. Also advised to stop Tylenol.

## 2016-03-08 NOTE — Telephone Encounter (Signed)
Called Levada Dy and confirmed receipt of voicemail.  No further questions.

## 2016-03-08 NOTE — Telephone Encounter (Signed)
Patient's daughter/caregiver Theadore Nan called "asking if Dr.Gudena can put mom on a higher dose of Hydrocodone.  The Oxycodone makes her too drowsy and she needs to be more alert.  She also wakes up at 2:00 in pain.  I give her tylenol OTC between the twelve hr morphine ER and 12 hr oxycodone.  She takes Morphine between 0700 and 0900.  She then is c/o pain again by 12:00 - 1:00 pm so I give the oxycodone 5-325mg .  Both are 12 hrs each is why I give her regular tylenol."  Return number [phone].

## 2016-03-10 ENCOUNTER — Ambulatory Visit (HOSPITAL_COMMUNITY)
Admission: RE | Admit: 2016-03-10 | Discharge: 2016-03-10 | Disposition: A | Discharge status: Home / Self Care | Payer: BC Managed Care – PPO | Source: Ambulatory Visit | Attending: Internal Medicine | Admitting: Internal Medicine

## 2016-03-10 ENCOUNTER — Other Ambulatory Visit: Payer: Self-pay | Admitting: Internal Medicine

## 2016-03-10 ENCOUNTER — Encounter: Payer: Self-pay | Admitting: *Deleted

## 2016-03-10 ENCOUNTER — Ambulatory Visit (HOSPITAL_BASED_OUTPATIENT_CLINIC_OR_DEPARTMENT_OTHER): Payer: BC Managed Care – PPO | Admitting: Hematology and Oncology

## 2016-03-10 ENCOUNTER — Encounter: Payer: Self-pay | Admitting: Hematology and Oncology

## 2016-03-10 VITALS — BP 114/80 | HR 100 | Temp 99.0°F | Resp 18 | Wt 171.5 lb

## 2016-03-10 DIAGNOSIS — C773 Secondary and unspecified malignant neoplasm of axilla and upper limb lymph nodes: Secondary | ICD-10-CM

## 2016-03-10 DIAGNOSIS — Z9221 Personal history of antineoplastic chemotherapy: Secondary | ICD-10-CM | POA: Insufficient documentation

## 2016-03-10 DIAGNOSIS — I340 Nonrheumatic mitral (valve) insufficiency: Secondary | ICD-10-CM | POA: Insufficient documentation

## 2016-03-10 DIAGNOSIS — I517 Cardiomegaly: Secondary | ICD-10-CM | POA: Insufficient documentation

## 2016-03-10 DIAGNOSIS — C50411 Malignant neoplasm of upper-outer quadrant of right female breast: Secondary | ICD-10-CM

## 2016-03-10 DIAGNOSIS — I071 Rheumatic tricuspid insufficiency: Secondary | ICD-10-CM | POA: Diagnosis not present

## 2016-03-10 MED ORDER — TRIAMCINOLONE ACETONIDE 0.1 % EX CREA: 1.0000 "application " | TOPICAL_CREAM | Freq: Three times a day (TID) | CUTANEOUS | Status: DC

## 2016-03-10 MED ORDER — OXYCODONE-ACETAMINOPHEN 5-325 MG PO TABS: 1.0000 | ORAL_TABLET | Freq: Two times a day (BID) | ORAL | Status: DC | PRN

## 2016-03-10 MED ORDER — GADOBENATE DIMEGLUMINE 529 MG/ML IV SOLN
25.0000 mL | Freq: Once | INTRAVENOUS | Status: AC | PRN
  Administered 2016-03-10: 25 mL via INTRAVENOUS

## 2016-03-10 MED ORDER — OXYCODONE-ACETAMINOPHEN 5-325 MG PO TABS: 2.0000 | ORAL_TABLET | Freq: Three times a day (TID) | ORAL | Status: DC | PRN

## 2016-03-10 NOTE — Progress Notes (Signed)
Patient Care Team: Unk Pinto, MD as PCP - General (Internal Medicine) Warden Fillers, MD as Consulting Physician (Ophthalmology) Kathie Rhodes, MD as Consulting Physician (Urology) Danella Sensing, MD as Consulting Physician (Dermatology) Alfonso Patten, RN as Registered Nurse Dorothy Spark, MD as Consulting Physician (Cardiology) Sylvan Cheese, NP as Nurse Practitioner (Hematology and Oncology)  DIAGNOSIS: Breast cancer of upper-outer quadrant of right female breast [facility])   Staging form: Breast, AJCC 7th Edition     Clinical stage from 12/17/2015: Stage IIA (T2, N0, M0) - Unsigned       Staging comments: Staged at breast conference on 3.1.17    SUMMARY OF ONCOLOGIC HISTORY:   Breast cancer of upper-outer quadrant of right female breast ([place])   12/05/2015 Mammogram Right breast mammogram and ultrasound revealed 2.8 x 2.7 x 2.1 irregular hypoechoic mass at 10:00 position, few mildly prominent right extrarenal lymph nodes are identified but unchanged since 2008   12/10/2015 Initial Diagnosis Right breast biopsy 10:00 position: 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 Differential diagnosis complete replacement of lymph node versus direct extension   01/19/2016 - 01/22/2016 Hospital Admission Hospitalization for abdominal pain, diarrhea, nausea and vomiting   02/21/2016 - 02/24/2016 Hospital Admission Hospitalization for herpes zoster and sepsis due to cellulitis   03/05/2016 Breast MRI Decrease in the right breast malignancy from  3.2 cm to 2.9 cm, decreasing the lymph node from 2.1 cm to 0.9 cm    CHIEF COMPLIANT: Follow-up after breast MRI   INTERVAL HISTORY: [name] is a 60 year old with above-mentioned history of right breast cancer who received neoadjuvant chemotherapy with 4 cycles of dose dense Adriamycin Cytoxan. Treatment was held because of herpes zoster-related sepsis and hospitalization for abdominal pain and diarrhea. We obtained a breast MRI. Neoadjuvant chemotherapy and she is here today to discuss the results. She is complaining of increasing pain in the back and legs. Denies any fevers or chills. Energy levels are improving. She is having to take 2 tablets of Percocet for pain relief.  REVIEW OF SYSTEMS:   Constitutional: Denies fevers, chills or abnormal weight loss Eyes: Denies blurriness of vision Ears, nose, mouth, throat, and face: Denies mucositis or sore throat Respiratory: Denies cough, dyspnea or wheezes Cardiovascular: Denies palpitation, chest discomfort Gastrointestinal:  Denies nausea, heartburn or change in bowel habits Skin: Denies abnormal skin rashes Lymphatics: Denies new lymphadenopathy or easy bruising Neurological:Denies numbness, tingling or new weaknesses Behavioral/Psych: Mood is stable, no new changes  Extremities: No lower extremity edema  All other systems were reviewed with the patient and are negative.  I have reviewed the past medical history, past surgical history, social history and family history with the patient and they are unchanged from previous note.  ALLERGIES:  is allergic to penicillins.  MEDICATIONS:  Current Outpatient Prescriptions  Medication Sig Dispense Refill  . acetaminophen (TYLENOL) 500 MG tablet Take 1,000 mg by mouth every 6 (six) hours as needed for moderate pain.    Marland Kitchen atenolol (TENORMIN) 100 MG tablet take 1/2 to 1 tablet by mouth once  daily as directed (Patient taking differently: takes 1 tab by mouth once daily) 90 tablet 1  .  atorvastatin (LIPITOR) 80 MG tablet take 1 tablet by mouth once daily 30 tablet 3  . cholecalciferol (VITAMIN D) 1000 UNITS tablet Take 4,000 Units by mouth daily.    . clotrimazole-betamethasone (LOTRISONE) cream Apply to affected area 2 times daily (Patient taking differently: Apply 1 application topically 2 (two) times daily as needed (for irritation). Apply to affected area 2 times daily) 45 g 1  . dexamethasone (DECADRON) 4 MG tablet Take 1 tablet (4 mg total) by mouth See admin instructions. Take 1 tablets by mouth once a day on the day after chemotherapy and then take 1 tablets two times a day for 2 days. Take with  food. Chemotherapy on Thursdays. 30 tablet 1  . diazepam (VALIUM) 5 MG tablet Take 5 mg by mouth every morning.     . diltiazem (CARDIZEM CD) 120 MG 24 hr capsule Take 1 capsule (120 mg total) by mouth daily. 90 capsule 3  . doxepin (SINEQUAN) 50 MG capsule take 1 capsule by mouth once daily at bedtime 30 capsule 1  . ferrous sulfate 325 (65 FE) MG tablet Take 325 mg by mouth daily with breakfast.    . fluconazole (DIFLUCAN) 150 MG tablet Take 1 tablet (150 mg total) by mouth once. 2 tablet 0  . gabapentin (NEURONTIN) 300 MG capsule Take 1 capsule (300 mg total) by mouth 2 (two) times daily. 60 capsule 0  . LORazepam (ATIVAN) 0.5 MG tablet Take 1 tablet (0.5 mg total) by mouth every 6 (six) hours as needed (nausea). 30 tablet 0  . montelukast (SINGULAIR) 10 MG tablet Take 1 tablet (10 mg total) by mouth daily. (Patient taking differently: Take 10 mg by mouth at bedtime. ) 30 tablet 2  . morphine (MS CONTIN) 15 MG 12 hr tablet Take 1 tablet (15 mg total) by mouth every 12 (twelve) hours. 60 tablet 0  . oxyCODONE-acetaminophen (PERCOCET/ROXICET) 5-325 MG tablet Take 2 tablets by mouth every 8 (eight) hours as needed for severe pain. 60 tablet 0  . PAZEO 0.7 % SOLN Place 1 drop into both eyes daily as needed (allergies).     . potassium chloride SA (K-DUR,KLOR-CON) 20 MEQ tablet Take  20 mEq by mouth 2 (two) times daily.    . prochlorperazine (COMPAZINE) 10 MG tablet Take 1 tablet (10 mg total) by mouth every 6 (six) hours as needed (Nausea or vomiting). 30 tablet 1  . rivaroxaban (XARELTO) 20 MG TABS tablet Take 1 tablet (20 mg total) by mouth daily with supper. 90 tablet 3  . triamcinolone cream (KENALOG) 0.1 % Apply 1 application topically 3 (three) times daily. 80 g 0  . valACYclovir (VALTREX) 1000 MG tablet Take 1 tablet (1,000 mg total) by mouth 3 (three) times daily. 12 tablet 0   No current facility-administered medications for this visit.    PHYSICAL EXAMINATION: ECOG PERFORMANCE STATUS: 1 - Symptomatic but completely ambulatory  Filed Vitals:   03/10/16 1544  BP: 114/80  Pulse: 100  Temp: 99 F (37.2 C)  Resp: 18   Filed Weights   03/10/16 1544  Weight: 171 lb 8 oz (77.792 kg)    GENERAL:alert, no distress and comfortable SKIN: skin color, texture, turgor are normal, no rashes or significant lesions EYES: normal, Conjunctiva are pink and non-injected, sclera clear OROPHARYNX:no exudate, no erythema and lips, buccal mucosa, and tongue normal  NECK: supple, thyroid normal size, non-tender, without  nodularity LYMPH:  no palpable lymphadenopathy in the cervical, axillary or inguinal LUNGS: clear to auscultation and percussion with normal breathing effort HEART: regular rate & rhythm and no murmurs and no lower extremity edema ABDOMEN:abdomen soft, non-tender and normal bowel sounds MUSCULOSKELETAL:no cyanosis of digits and no clubbing  NEURO: alert & oriented x 3 with fluent speech, no focal motor/sensory deficits EXTREMITIES: No lower extremity edema   LABORATORY DATA:  I have reviewed the data as listed   Chemistry      Component Value Date/Time   NA 137 02/26/2016 1232   NA 136 02/23/2016 0329   K 3.6 02/26/2016 1232   K 3.5 02/23/2016 0329   CL 102 02/23/2016 0329   CO2 26 02/26/2016 1232   CO2 24 02/23/2016 0329   BUN 7.7 02/26/2016  1232   BUN <5* 02/23/2016 0329   CREATININE 0.7 02/26/2016 1232   CREATININE 0.74 02/23/2016 0329   CREATININE 0.56 01/27/2016 0953      Component Value Date/Time   CALCIUM 8.7 02/26/2016 1232   CALCIUM 7.5* 02/23/2016 0329   ALKPHOS 77 02/26/2016 1232   ALKPHOS 56 02/23/2016 0329   AST 24 02/26/2016 1232   AST 26 02/23/2016 0329   ALT 18 02/26/2016 1232   ALT 17 02/23/2016 0329   BILITOT <0.30 02/26/2016 1232   BILITOT 0.3 02/23/2016 0329       Lab Results  Component Value Date   WBC 10.6* 02/26/2016   HGB 7.9* 02/26/2016   HCT 24.9* 02/26/2016   MCV 87.4 02/26/2016   PLT 416* 02/26/2016   NEUTROABS 7.0* 02/26/2016     ASSESSMENT & PLAN:  Breast cancer of upper-outer quadrant of right female breast ([place]) Right breast biopsy 12/10/2015: 10:00 position: 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  Breast MRI 03/05/2016: Decrease in the right breast malignancy from 3.2 cm to 2.9 cm, decreasing the lymph node from 2.1 cm to 0.9 cm  Plan: 1. Breast conserving surgery with axillary lymph node dissection 2. Followed by adjuvant radiation therapy  Return to clinic after surgery for follow-up.  No orders of the defined types were placed in this encounter.   The patient has a good understanding of the overall plan. she agrees with it. she will call with any problems that may develop before the next visit here.   Rulon Eisenmenger,  MD 03/10/2016

## 2016-03-10 NOTE — Progress Notes (Signed)
Dr. Lindi Adie stopped chemotherapy early d/t toxicities. Took pt and family back to the infusion room for pt to "ring the bell". Relate doing "better" and "gaining strength every day". Denies further needs at this time. Encourage pt to call with questions or concerns.

## 2016-03-10 NOTE — Assessment & Plan Note (Signed)
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  Breast MRI 03/05/2016: Decrease in the right breast malignancy from 3.2 cm to 2.9 cm, decreasing the lymph node from 2.1 cm to 0.9 cm  Plan: 1. Breast conserving surgery with axillary lymph node dissection 2. Followed by adjuvant radiation therapy

## 2016-03-11 ENCOUNTER — Encounter: Payer: Self-pay | Admitting: Internal Medicine

## 2016-03-11 ENCOUNTER — Encounter (HOSPITAL_COMMUNITY): Payer: Self-pay | Admitting: Nurse Practitioner

## 2016-03-11 ENCOUNTER — Emergency Department (HOSPITAL_COMMUNITY)
Admission: EM | Admit: 2016-03-11 | Discharge: 2016-03-11 | Disposition: A | Discharge status: Home / Self Care | Payer: BC Managed Care – PPO | Attending: Emergency Medicine | Admitting: Emergency Medicine

## 2016-03-11 ENCOUNTER — Telehealth: Payer: Self-pay | Admitting: *Deleted

## 2016-03-11 DIAGNOSIS — D649 Anemia, unspecified: Secondary | ICD-10-CM | POA: Diagnosis not present

## 2016-03-11 DIAGNOSIS — Z87442 Personal history of urinary calculi: Secondary | ICD-10-CM | POA: Diagnosis not present

## 2016-03-11 DIAGNOSIS — E559 Vitamin D deficiency, unspecified: Secondary | ICD-10-CM | POA: Insufficient documentation

## 2016-03-11 DIAGNOSIS — Z8719 Personal history of other diseases of the digestive system: Secondary | ICD-10-CM | POA: Diagnosis not present

## 2016-03-11 DIAGNOSIS — Z7901 Long term (current) use of anticoagulants: Secondary | ICD-10-CM | POA: Diagnosis not present

## 2016-03-11 DIAGNOSIS — Z8614 Personal history of Methicillin resistant Staphylococcus aureus infection: Secondary | ICD-10-CM | POA: Diagnosis not present

## 2016-03-11 DIAGNOSIS — Z7952 Long term (current) use of systemic steroids: Secondary | ICD-10-CM | POA: Diagnosis not present

## 2016-03-11 DIAGNOSIS — Z853 Personal history of malignant neoplasm of breast: Secondary | ICD-10-CM | POA: Insufficient documentation

## 2016-03-11 DIAGNOSIS — Z880 Allergy status to penicillin: Secondary | ICD-10-CM | POA: Insufficient documentation

## 2016-03-11 DIAGNOSIS — Z79891 Long term (current) use of opiate analgesic: Secondary | ICD-10-CM | POA: Insufficient documentation

## 2016-03-11 DIAGNOSIS — R112 Nausea with vomiting, unspecified: Secondary | ICD-10-CM | POA: Diagnosis present

## 2016-03-11 DIAGNOSIS — R509 Fever, unspecified: Secondary | ICD-10-CM | POA: Insufficient documentation

## 2016-03-11 DIAGNOSIS — I480 Paroxysmal atrial fibrillation: Secondary | ICD-10-CM | POA: Diagnosis not present

## 2016-03-11 DIAGNOSIS — Z79899 Other long term (current) drug therapy: Secondary | ICD-10-CM | POA: Diagnosis not present

## 2016-03-11 DIAGNOSIS — E7800 Pure hypercholesterolemia, unspecified: Secondary | ICD-10-CM | POA: Diagnosis not present

## 2016-03-11 DIAGNOSIS — I10 Essential (primary) hypertension: Secondary | ICD-10-CM | POA: Insufficient documentation

## 2016-03-11 DIAGNOSIS — Z872 Personal history of diseases of the skin and subcutaneous tissue: Secondary | ICD-10-CM | POA: Diagnosis not present

## 2016-03-11 LAB — COMPREHENSIVE METABOLIC PANEL
ALBUMIN: 2.9 g/dL — AB (ref 3.5–5.0)
ALT: 13 U/L — ABNORMAL LOW (ref 14–54)
ANION GAP: 9 (ref 5–15)
AST: 29 U/L (ref 15–41)
Alkaline Phosphatase: 86 U/L (ref 38–126)
BILIRUBIN TOTAL: 0.7 mg/dL (ref 0.3–1.2)
BUN: 7 mg/dL (ref 6–20)
CO2: 25 mmol/L (ref 22–32)
Calcium: 9.5 mg/dL (ref 8.9–10.3)
Chloride: 101 mmol/L (ref 101–111)
Creatinine, Ser: 0.7 mg/dL (ref 0.44–1.00)
GFR calc Af Amer: >60 mL/min (ref >60)
GFR calc non Af Amer: >60 mL/min (ref >60)
GLUCOSE: 96 mg/dL (ref 65–99)
POTASSIUM: 4.3 mmol/L (ref 3.5–5.1)
SODIUM: 135 mmol/L (ref 135–145)
TOTAL PROTEIN: 6.8 g/dL (ref 6.5–8.1)

## 2016-03-11 LAB — CBC
HEMATOCRIT: 30.9 % — AB (ref 36.0–46.0)
HEMOGLOBIN: 9.4 g/dL — AB (ref 12.0–15.0)
MCH: 28.9 pg (ref 26.0–34.0)
MCHC: 30.4 g/dL (ref 30.0–36.0)
MCV: 95.1 fL (ref 78.0–100.0)
Platelets: 306 10*3/uL (ref 150–400)
RBC: 3.25 MIL/uL — ABNORMAL LOW (ref 3.87–5.11)
RDW: 21.8 % — ABNORMAL HIGH (ref 11.5–15.5)
WBC: 7.4 10*3/uL (ref 4.0–10.5)

## 2016-03-11 LAB — URINALYSIS, ROUTINE W REFLEX MICROSCOPIC
BILIRUBIN URINE: NEGATIVE
Glucose, UA: NEGATIVE mg/dL
Hgb urine dipstick: NEGATIVE
Ketones, ur: NEGATIVE mg/dL
NITRITE: NEGATIVE
PH: 7 (ref 5.0–8.0)
Protein, ur: NEGATIVE mg/dL
SPECIFIC GRAVITY, URINE: 1.009 (ref 1.005–1.030)

## 2016-03-11 LAB — URINE MICROSCOPIC-ADD ON

## 2016-03-11 MED ORDER — SODIUM CHLORIDE 0.9 % IV BOLUS (SEPSIS)
1000.0000 mL | Freq: Once | INTRAVENOUS | Status: AC
  Administered 2016-03-11: 1000 mL via INTRAVENOUS

## 2016-03-11 MED ORDER — ONDANSETRON HCL 4 MG PO TABS: 4.0000 mg | ORAL_TABLET | Freq: Four times a day (QID) | ORAL | Status: DC

## 2016-03-11 MED ORDER — OXYCODONE-ACETAMINOPHEN 5-325 MG PO TABS
2.0000 | ORAL_TABLET | Freq: Once | ORAL | Status: AC
  Administered 2016-03-11: 2 via ORAL
  Filled 2016-03-11: qty 2

## 2016-03-11 MED ORDER — ONDANSETRON HCL 4 MG/2ML IJ SOLN
4.0000 mg | Freq: Once | INTRAMUSCULAR | Status: AC
  Administered 2016-03-11: 4 mg via INTRAVENOUS
  Filled 2016-03-11: qty 2

## 2016-03-11 NOTE — Telephone Encounter (Signed)
[place] nurse called with concerns about patient's current symptoms.  Patient currently febrile but stable vital signs otherwise.  Per Starlyn Skeans, PA-C orders, advised Home Health that patient needs to go to ER d/t her immune system being compromised.  Patient expressed understanding.

## 2016-03-11 NOTE — ED Notes (Signed)
Pt c/o n/v, fevers today. She is currently receiving care for breast cancer at [facility]. Home health nurse was concerned for infection and referred her to ED for further care. She is alert and breathing easily

## 2016-03-11 NOTE — Discharge Instructions (Signed)
Return to the ED with any concerns including vomiting and not able to keep down liquids, abdominal pain, difficulty breathing, decreased level of alertness/lethargy, or any other alarming symptoms

## 2016-03-11 NOTE — ED Notes (Signed)
Pt given cup of water per fluid challenge. So far she denies nausea and is not vomiting.

## 2016-03-11 NOTE — ED Provider Notes (Signed)
CSN: [id number]     Arrival date & time 03/11/16  1531 History   First MD Initiated Contact with Patient 03/11/16 1617     Chief Complaint  Patient presents with  . Emesis     (Consider location/radiation/quality/duration/timing/severity/associated sxs/prior Treatment) HPI  Pt with hx of breast cancer on chemotherapy presengin with fever as well as nausea and vomiting.  She states she has felt weak since last chemo 3 weeks ago, but fever/chills and vomiting began today.  She has had multiple episodes of emesis - nonbloody and nonbilious.  No abdominal pain.  No diarrhea.  Pt states home health nurse came and took her temp- it was 51- so she was referred to the ED for further evaluation.  No fainting.  No chest pain, no difficulty breathing.  Denies dysuria, no cough, sore throat.  There are no other associated systemic symptoms, there are no other alleviating or modifying factors.   Past Medical History  Diagnosis Date  . MRSA (methicillin resistant staph aureus) culture positive 2009     none since per patient  . Eczema   . Hypercholesteremia   . Hypertension   . Kidney stones   . Anemia   . Vitamin D deficiency   . Paroxysmal atrial fibrillation ([place]) 10/2011, 06/2015  . GERD (gastroesophageal reflux disease)   . Atrial fibrillation ([place]) 2016  . Dysrhythmia     A FIB - followed by Dr. Meda Coffee  . Breast cancer of upper-outer quadrant of right female breast ([place]) 12/12/2015  . Breast cancer ([place])   . Complication of anesthesia 10-06-15     slow to awaken after   . PONV (postoperative nausea and vomiting) 10-06-15    admitted back to hospital for dehydration  . Family history of breast cancer   . Family history of prostate cancer    Past Surgical History  Procedure Laterality Date  . Breast surgery Left     biopsy-neg  . Kidney stone surgery  2013  . Nephrolithotomy Right 10/06/2015    Procedure: NEPHROLITHOTOMY PERCUTANEOUS;  Surgeon: Kathie Rhodes, MD;  Location: [facility];   Service: Urology;  Laterality: Right;  . Cystoscopy with holmium laser lithotripsy Right 10/06/2015    Procedure: CYSTOSCOPY WITH HOLMIUM LASER LITHOTRIPSY;  Surgeon: Kathie Rhodes, MD;  Location: [facility];  Service: Urology;  Laterality: Right;  . Portacath placement Left 12/23/2015    Procedure: INSERTION PORT-A-CATH WITH [place];  Surgeon: Stark Klein, MD;  Location: [facility];  Service: General;  Laterality: Left;   Family History  Problem Relation Age of Onset  . Hypertension Mother   . Hepatitis C Mother   . Heart disease Father   . Dementia Father   . Aneurysm Brother   . Breast cancer Paternal Aunt     dx in her 41s  . Prostate cancer Paternal Uncle     44s  . Prostate cancer Paternal Uncle     dx in his 59s  . Leukemia Paternal Aunt   . Breast cancer Cousin     mother's paternal first cousin  . Thyroid cancer Cousin     mother's paternal first cousin   Social History  Substance Use Topics  . Smoking status: Never Smoker   . Smokeless tobacco: Never Used  . Alcohol Use: No   OB History    No data available     Review of Systems  ROS reviewed and all otherwise negative except for mentioned in HPI    Allergies  Penicillins  Home  Medications   Prior to Admission medications   Medication Sig Start Date End Date Taking? Authorizing Provider  atenolol (TENORMIN) 100 MG tablet take 1/2 to 1 tablet by mouth once daily as directed Patient taking differently: takes 1 tab by mouth once daily 02/11/16  Yes Unk Pinto, MD  atorvastatin (LIPITOR) 80 MG tablet take 1 tablet by mouth once daily 03/10/16  Yes Unk Pinto, MD  cholecalciferol (VITAMIN D) 1000 UNITS tablet Take 4,000 Units by mouth daily.   Yes Historical Provider, MD  clotrimazole-betamethasone (LOTRISONE) cream Apply to affected area 2 times daily Patient taking differently: Apply 1 application topically 2 (two) times daily as needed (for irritation). Apply to affected area 2 times daily 02/17/16 02/16/17 Yes  Courtney Forcucci, PA-C  diazepam (VALIUM) 5 MG tablet Take 5 mg by mouth every morning.    Yes Historical Provider, MD  diltiazem (CARDIZEM CD) 120 MG 24 hr capsule Take 1 capsule (120 mg total) by mouth daily. 10/24/15  Yes Dorothy Spark, MD  doxepin (SINEQUAN) 50 MG capsule take 1 capsule by mouth once daily at bedtime 02/10/16  Yes Vicie Mutters, PA-C  ferrous sulfate 325 (65 FE) MG tablet Take 325 mg by mouth daily with breakfast.   Yes Historical Provider, MD  LORazepam (ATIVAN) 0.5 MG tablet Take 1 tablet (0.5 mg total) by mouth every 6 (six) hours as needed (nausea). 02/11/16  Yes Nicholas Lose, MD  montelukast (SINGULAIR) 10 MG tablet Take 1 tablet (10 mg total) by mouth daily. 12/26/15 12/25/16 Yes Courtney Forcucci, PA-C  morphine (MS CONTIN) 15 MG 12 hr tablet Take 1 tablet (15 mg total) by mouth every 12 (twelve) hours. 02/26/16  Yes Nicholas Lose, MD  oxyCODONE-acetaminophen (PERCOCET/ROXICET) 5-325 MG tablet Take 2 tablets by mouth every 8 (eight) hours as needed for severe pain. 03/10/16  Yes Nicholas Lose, MD  PAZEO 0.7 % SOLN Place 1 drop into both eyes daily as needed (allergies).  12/15/15  Yes Historical Provider, MD  potassium chloride SA (K-DUR,KLOR-CON) 20 MEQ tablet Take 20 mEq by mouth 2 (two) times daily. 12/04/15  Yes Historical Provider, MD  prochlorperazine (COMPAZINE) 10 MG tablet Take 1 tablet (10 mg total) by mouth every 6 (six) hours as needed (Nausea or vomiting). 02/11/16  Yes Nicholas Lose, MD  rivaroxaban (XARELTO) 20 MG TABS tablet Take 1 tablet (20 mg total) by mouth daily with supper. 01/23/16  Yes Theodis Blaze, MD  triamcinolone cream (KENALOG) 0.1 % Apply 1 application topically 3 (three) times daily. 03/10/16  Yes Nicholas Lose, MD  triamterene-hydrochlorothiazide (MAXZIDE-25) 37.5-25 MG tablet Take 1 tablet by mouth daily. 02/28/16  Yes Historical Provider, MD  ondansetron (ZOFRAN) 4 MG tablet Take 1 tablet (4 mg total) by mouth every 6 (six) hours. 03/11/16   Alfonzo Beers, MD   BP 108/61 mmHg  Pulse 89  Temp(Src) 99.4 F (37.4 C) (Oral)  Resp 17  SpO2 99%  Vitals reviewed Physical Exam  Physical Examination: General appearance - alert, well appearing, and in no distress Mental status - alert, oriented to person, place, and time Eyes - no conjunctival injection no scleral icterus Mouth - mucous membranes moist, pharynx normal without lesions Chest - clear to auscultation, no wheezes, rales or rhonchi, symmetric air entry Heart - normal rate, regular rhythm, normal S1, S2, no murmurs, rubs, clicks or gallops Abdomen - soft, nontender, nondistended, no masses or organomegaly Neurological - alert, oriented, normal speech Extremities - peripheral pulses normal, no pedal edema, no clubbing or  cyanosis Skin - normal coloration and turgor, no rashes  ED Course  Procedures (including critical care time) Labs Review Labs Reviewed  COMPREHENSIVE METABOLIC PANEL - Abnormal; Notable for the following:    Albumin 2.9 (*)    ALT 13 (*)    All other components within normal limits  CBC - Abnormal; Notable for the following:    RBC 3.25 (*)    Hemoglobin 9.4 (*)    HCT 30.9 (*)    RDW 21.8 (*)    All other components within normal limits  URINALYSIS, ROUTINE W REFLEX MICROSCOPIC (NOT AT [facility]) - Abnormal; Notable for the following:    Leukocytes, UA TRACE (*)    All other components within normal limits  URINE MICROSCOPIC-ADD ON - Abnormal; Notable for the following:    Squamous Epithelial / LPF 0-5 (*)    Bacteria, UA RARE (*)    All other components within normal limits    Imaging Review No results found. I have personally reviewed and evaluated these images and lab results as part of my medical decision-making.   EKG Interpretation None      MDM   Final diagnoses:  Non-intractable vomiting with nausea, vomiting of unspecified type  Febrile illness    Pt presenting with c/o vomiting and fever today.  Abdominal exam is benign.  Pt is  not neutropenic- last chemo was 3 weeks ago. She feels much improved after nausea meds and fluids.  Pt advised to arrange for close f/u with PMD.  Given rx for zofran for home use.  Discharged with strict return precautions.  Pt agreeable with plan.    Alfonzo Beers, MD 03/11/16 [phone]

## 2016-03-16 ENCOUNTER — Ambulatory Visit (INDEPENDENT_AMBULATORY_CARE_PROVIDER_SITE_OTHER): Payer: BC Managed Care – PPO | Admitting: Internal Medicine

## 2016-03-16 ENCOUNTER — Encounter: Payer: Self-pay | Admitting: Internal Medicine

## 2016-03-16 VITALS — BP 128/66 | HR 90 | Temp 98.0°F | Resp 16 | Ht 62.0 in | Wt 169.0 lb

## 2016-03-16 DIAGNOSIS — L98491 Non-pressure chronic ulcer of skin of other sites limited to breakdown of skin: Secondary | ICD-10-CM

## 2016-03-16 DIAGNOSIS — B029 Zoster without complications: Secondary | ICD-10-CM | POA: Diagnosis not present

## 2016-03-16 MED ORDER — GABAPENTIN 100 MG PO CAPS: 100.0000 mg | ORAL_CAPSULE | Freq: Three times a day (TID) | ORAL | Status: DC

## 2016-03-16 NOTE — Progress Notes (Signed)
   Subjective:    Patient ID: [name], female    DOB: 1955-10-21, 60 y.o.   MRN: [id number]  HPI  Patient presents to the office for evaluation after visit to the ER for fever and nausea and vomiting.  Patient was given antipyretics and 2 bags of fluid in the ER with antiemetics and the patient was discharged home.  She feels that she has recovered from this, but is here today mainly due to pain in the area where she has had shingles.  She was diagnosed with shingles outbreak to the left side of her torso.  The vessicles ruputured, scabbed, and fell off and now she has an open wound.  Her daughter has been putting kenalog and covering it with saran wrap.  She has not had any drainage.  She is taking MS contin BID.  She was given gabapentin at the hospital but has not been taking it.  No drainage, redness, fevers, chills, nausea, or vomiting.    Review of Systems  Constitutional: Positive for fever and fatigue. Negative for chills.  Respiratory: Negative for cough, chest tightness and shortness of breath.   Cardiovascular: Negative for chest pain and palpitations.  Gastrointestinal: Negative for nausea and vomiting.  Skin: Positive for rash.       Objective:   Physical Exam  Constitutional: She is oriented to person, place, and time. She appears well-developed and well-nourished. No distress.  HENT:  Head: Normocephalic.  Mouth/Throat: Oropharynx is clear and moist. No oropharyngeal exudate.  Eyes: Conjunctivae are normal. No scleral icterus.  Neck: Normal range of motion. Neck supple. No JVD present. No thyromegaly present.  Cardiovascular: Normal rate, regular rhythm, normal heart sounds and intact distal pulses.  Exam reveals no gallop and no friction rub.   No murmur heard. Pulmonary/Chest: Effort normal and breath sounds normal. No respiratory distress. She has no wheezes. She has no rales. She exhibits no tenderness.  Abdominal: Bowel sounds are normal.  Lymphadenopathy:    She has no cervical adenopathy.  Neurological: She is alert and oriented to person, place, and time.  Skin: Skin is warm and dry. Rash noted. She is not diaphoretic.     Psychiatric: She has a normal mood and affect. Her behavior is normal. Judgment and thought content normal.  Nursing note and vitals reviewed.   Filed Vitals:   03/16/16 1134  BP: 128/66  Pulse: 90  Temp: 98 F (36.7 C)  Resp: 16         Assessment & Plan:    1. 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 Skin ulceration, limited to breakdown of skin ([place]) -wound care ordered -wounds dressed here -patient and daughter instructed to leave bandages on for the next 48 hours  Patient to call the office if recurrent fever, chills, nausea, vomiting, and spreading redness.

## 2016-03-16 NOTE — Progress Notes (Deleted)
Assessment and Plan:  Hypertension:  -Continue medication,  -monitor blood pressure at home.  -Continue DASH diet.   -Reminder to go to the ER if any CP, SOB, nausea, dizziness, severe HA, changes vision/speech, left arm numbness and tingling, and jaw pain.  Cholesterol: -Continue diet and exercise.  -Check cholesterol.   Pre-diabetes: -Continue diet and exercise.  -Check A1C  Vitamin D Def: -check level -continue medications.   Continue diet and meds as discussed. Further disposition pending results of labs.  HPI 60 y.o. female  presents for 3 month follow up with hypertension, hyperlipidemia, prediabetes and vitamin D.   Her blood pressure {HAS HAS NOT:18834} been controlled at home, today their BP is  .   She {DOES_DOES NF:[id number] workout. She denies chest pain, shortness of breath, dizziness.   She {ACTION; IS/IS [id number] on cholesterol medication and denies myalgias. Her cholesterol {ACTION; IS/IS NOT:21021397} at goal. The cholesterol last visit was:   Lab Results  Component Value Date   CHOL 227* 01/27/2016   HDL 48 01/27/2016   LDLCALC 139* 01/27/2016   TRIG 200* 01/27/2016   CHOLHDL 4.7 01/27/2016     She {Has/has not:18111} been working on diet and exercise for prediabetes, and denies {Symptoms; diabetes w/o none:19199}. Last A1C in the office was:  Lab Results  Component Value Date   HGBA1C 6.1* 01/27/2016    Patient is on Vitamin D supplement.  Lab Results  Component Value Date   VD25OH 42 01/27/2016      Current Medications:  Current Outpatient Prescriptions on File Prior to Visit  Medication Sig Dispense Refill  . atenolol (TENORMIN) 100 MG tablet take 1/2 to 1 tablet by mouth once daily as directed (Patient taking differently: takes 1 tab by mouth once daily) 90 tablet 1  . atorvastatin (LIPITOR) 80 MG tablet take 1 tablet by mouth once daily 30 tablet 3  . cholecalciferol (VITAMIN D) 1000 UNITS tablet Take 4,000 Units by mouth daily.    .  clotrimazole-betamethasone (LOTRISONE) cream Apply to affected area 2 times daily (Patient taking differently: Apply 1 application topically 2 (two) times daily as needed (for irritation). Apply to affected area 2 times daily) 45 g 1  . diazepam (VALIUM) 5 MG tablet Take 5 mg by mouth every morning.     . diltiazem (CARDIZEM CD) 120 MG 24 hr capsule Take 1 capsule (120 mg total) by mouth daily. 90 capsule 3  . doxepin (SINEQUAN) 50 MG capsule take 1 capsule by mouth once daily at bedtime 30 capsule 1  . ferrous sulfate 325 (65 FE) MG tablet Take 325 mg by mouth daily with breakfast.    . LORazepam (ATIVAN) 0.5 MG tablet Take 1 tablet (0.5 mg total) by mouth every 6 (six) hours as needed (nausea). 30 tablet 0  . montelukast (SINGULAIR) 10 MG tablet Take 1 tablet (10 mg total) by mouth daily. 30 tablet 2  . morphine (MS CONTIN) 15 MG 12 hr tablet Take 1 tablet (15 mg total) by mouth every 12 (twelve) hours. 60 tablet 0  . ondansetron (ZOFRAN) 4 MG tablet Take 1 tablet (4 mg total) by mouth every 6 (six) hours. 12 tablet 0  . oxyCODONE-acetaminophen (PERCOCET/ROXICET) 5-325 MG tablet Take 2 tablets by mouth every 8 (eight) hours as needed for severe pain. 60 tablet 0  . PAZEO 0.7 % SOLN Place 1 drop into both eyes daily as needed (allergies).     . potassium chloride SA (K-DUR,KLOR-CON) 20 MEQ tablet Take 20 mEq by  mouth 2 (two) times daily.    . prochlorperazine (COMPAZINE) 10 MG tablet Take 1 tablet (10 mg total) by mouth every 6 (six) hours as needed (Nausea or vomiting). 30 tablet 1  . rivaroxaban (XARELTO) 20 MG TABS tablet Take 1 tablet (20 mg total) by mouth daily with supper. 90 tablet 3  . triamcinolone cream (KENALOG) 0.1 % Apply 1 application topically 3 (three) times daily. 80 g 0  . triamterene-hydrochlorothiazide (MAXZIDE-25) 37.5-25 MG tablet Take 1 tablet by mouth daily.     No current facility-administered medications on file prior to visit.    Medical History:  Past Medical  History  Diagnosis Date  . MRSA (methicillin resistant staph aureus) culture positive 2009     none since per patient  . Eczema   . Hypercholesteremia   . Hypertension   . Kidney stones   . Anemia   . Vitamin D deficiency   . Paroxysmal atrial fibrillation ([place]) 10/2011, 06/2015  . GERD (gastroesophageal reflux disease)   . Atrial fibrillation ([place]) 2016  . Dysrhythmia     A FIB - followed by Dr. Meda Coffee  . Breast cancer of upper-outer quadrant of right female breast ([place]) 12/12/2015  . Breast cancer ([place])   . Complication of anesthesia 10-06-15     slow to awaken after   . PONV (postoperative nausea and vomiting) 10-06-15    admitted back to hospital for dehydration  . Family history of breast cancer   . Family history of prostate cancer     Allergies:  Allergies  Allergen Reactions  . Penicillins Other (See Comments)    Has patient had a PCN reaction causing immediate rash, facial/tongue/throat swelling, SOB or lightheadedness with hypotension: NO Has patient had a PCN reaction causing severe rash involving mucus membranes or skin necrosis: NO Has patient had a PCN reaction that required hospitalization NO Has patient had a PCN reaction occurring within the last 10 years: NO If all of the above answers are "NO", then may proceed with Cephalosporin use.     Review of Systems:  ROS  Family history- Review and unchanged  Social history- Review and unchanged  Physical Exam: There were no vitals taken for this visit. Wt Readings from Last 3 Encounters:  03/10/16 171 lb 8 oz (77.792 kg)  02/26/16 179 lb 11.2 oz (81.511 kg)  02/21/16 174 lb (78.926 kg)    General Appearance: Well nourished well developed, in no apparent distress. Eyes: PERRLA, EOMs, conjunctiva no swelling or erythema ENT/Mouth: Ear canals normal without obstruction, swelling, erythma, discharge.  TMs normal bilaterally.  Oropharynx moist, clear, without exudate, or postoropharyngeal swelling. Neck:  Supple, thyroid normal,no cervical adenopathy  Respiratory: Respiratory effort normal, Breath sounds clear A&P without rhonchi, wheeze, or rale.  No retractions, no accessory usage. Cardio: RRR with no MRGs. Brisk peripheral pulses without edema.  Abdomen: Soft, + BS,  Non tender, no guarding, rebound, hernias, masses. Musculoskeletal: Full ROM, 5/5 strength, {PSY - GAIT AND STATION:22860} gait Skin: Warm, dry without rashes, lesions, ecchymosis.  Neuro: Awake and oriented X 3, Cranial nerves intact. Normal muscle tone, no cerebellar symptoms. Psych: Normal affect, Insight and Judgment appropriate.    Starlyn Skeans, PA-C 11:22 AM [facility]

## 2016-03-16 NOTE — Patient Instructions (Signed)
Venous Ulcer A venous ulcer is a shallow sore on your lower leg that is caused by poor circulation in your veins. This condition used to be called stasis ulcer.  Veins have valves that help return blood to the heart. If these valves do not work properly, it can cause blood to flow backward and to back up into the veins near the skin. When that happens, blood can pool in your lower legs. The blood can then leak out of your veins, which can irritate your skin. This may cause a break in your skin that becomes a venous ulcer. Venous ulcer is the most common type of lower leg ulcer. You may have venous ulcers on one leg or on both legs. The area where this condition most commonly develops is around the ankles. A venous ulcer may last for a long time (chronic ulcer) or it may return repeatedly (recurrent ulcer). CAUSES Any condition that causes poor circulation to your legs can lead to a venous ulcer.  RISK FACTORS This condition is more likely to develop in:  People who are 65 years of age or older.  People who are overweight.  People who are not active.  People who have had a leg ulcer in the past.  People who have clots in their lower leg veins (deep vein thrombosis).  People who have inflammation of their leg veins (phlebitis).  Women who have given birth.  People who smoke. SYMPTOMS  The most common symptom of this condition is an open sore near your ankle. Other symptoms may include:  Swelling.  Thickening of the skin.  Fluid leaking from the ulcer.  Bleeding.  Itching.  Pain and swelling that gets worse when you stand up and feels better when you raise your leg.  Blotchy skin.  Darkening of the skin. DIAGNOSIS  Your health care provider may suspect a venous ulcer based on your medical history and your risk factors. Your health care provider will check the skin on your legs. Other tests may be done to learn more about the ulcer and to determine the best way to treat it.  Tests that may be done include:  Measuring the blood pressure in your arms and legs.  Using sound waves (ultrasound) to measure the blood flow in your leg veins. TREATMENT You may need to try several different types of treatment to get your venous ulcer to heal. Healing may take a long time. Treatment may include:  Keeping your leg raised (elevated).  Wearing a type of bandage or stocking to compress the veins of your leg (compression therapy). Venous wounds are not likely to heal or to stay healed without compression.  Taking medicines to improve blood flow.  Taking antibiotic medicines to treat infection.  Cleaning your ulcer and removing any dead tissue from the wound (debridement).  Placing various types of medicated bandage (dressings) or medicated wraps on your ulcer. This helps the ulcer to heal and helps to prevent infection. Surgery is sometimes needed to close the wound using a piece of skin taken from another area of your body (graft). You may need surgery if other treatments are not working or if your ulcer is very deep. HOME CARE INSTRUCTIONS Wound Care  Follow instructions from your health care provider about:  How to take care of your wound.  When and how you should change your bandage (dressing).  When you should remove your dressing. If your dressing is dry and sticks to your leg when you try to remove   it, moisten or wet the dressing with saline solution or water so that the dressing can be removed without harming your skin or wound tissue.  Check your wound every day for signs of infection. Have a caregiver do this for you if you are not able to do it yourself. Check for:  More redness, swelling, or pain. More fluid or blood.  Pus, warmth, or a bad smell. Medicines   Take over-the-counter and prescription medicines only as told by your health care provider.  If you were prescribed an antibiotic medicine, take it or apply it as told by your health care  provider. Do not stop taking or using the antibiotic even if your condition improves. Activity  Do not stand or sit in one position for a long period of time. Rest with your legs raised during the day. If possible, keep your legs above your heart for 30 minutes, 3-4 times a day, or as told by your health care provider.  Do not sit with your legs crossed.   Walk often to increase the blood flow in your legs.Ask your health care provider what level of activity is safe for you.  If you are taking a long ride in a car or plane, take a break to walk around at least once every two hours, or as often as your health care provider recommends. Ask your health care provider if you should take aspirin before long trips.  General Instructions  Wear elastic stockings, compression stockings, or support hose as told by your health care provider. This is very important.  Raise the foot of your bed as told by your health care provider.  Do not smoke.  Keep all follow-up visits as told by your health care provider. This is important. SEEK MEDICAL CARE IF:   You have a fever.   Your ulcer is getting larger or is not healing.   Your pain gets worse.   You have more redness or swelling around your ulcer.  You have more fluid, blood, or pus coming from your ulcer after it has been cleaned by you or your health care provider.  You have warmth or a bad smell coming from your ulcer.   This information is not intended to replace advice given to you by your health care provider. Make sure you discuss any questions you have with your health care provider.   Document Released: 06/29/2001 Document Revised: 06/25/2015 Document Reviewed: 02/12/2015 Elsevier Interactive Patient Education 2016 [employer]  

## 2016-03-17 ENCOUNTER — Encounter: Payer: Self-pay | Admitting: Hematology and Oncology

## 2016-03-17 NOTE — Progress Notes (Signed)
Patient's daughter called to follow up from March. She states patient was in the hospital and could not meet with me during that time. Asked if her mom was still in active treatment and she said no they had to discontinue it because she was too weak. She said her mom was going to do surgery. Reviewed my previous notes and the resources I had for her required her to be in active treatment. Advised daughter to contact me after surgery, to update on treatment options and we would re-evaluate resources at that time. She verbalized understanding.

## 2016-03-18 ENCOUNTER — Telehealth: Payer: Self-pay | Admitting: Genetic Counselor

## 2016-03-18 ENCOUNTER — Encounter: Payer: Self-pay | Admitting: Genetic Counselor

## 2016-03-18 ENCOUNTER — Encounter: Payer: Self-pay | Admitting: Internal Medicine

## 2016-03-18 DIAGNOSIS — Z1379 Encounter for other screening for genetic and chromosomal anomalies: Secondary | ICD-10-CM | POA: Insufficient documentation

## 2016-03-18 NOTE — Telephone Encounter (Signed)
No answer

## 2016-03-22 ENCOUNTER — Encounter (HOSPITAL_COMMUNITY): Payer: Self-pay | Admitting: Nurse Practitioner

## 2016-03-22 ENCOUNTER — Encounter: Payer: Self-pay | Admitting: Physician Assistant

## 2016-03-22 ENCOUNTER — Emergency Department (HOSPITAL_COMMUNITY): Payer: BC Managed Care – PPO

## 2016-03-22 ENCOUNTER — Inpatient Hospital Stay (HOSPITAL_COMMUNITY)
Admission: EM | Admit: 2016-03-22 | Discharge: 2016-03-26 | DRG: 193 | Disposition: A | Discharge status: Home Health | Payer: BC Managed Care – PPO | Attending: Internal Medicine | Admitting: Internal Medicine

## 2016-03-22 ENCOUNTER — Ambulatory Visit (INDEPENDENT_AMBULATORY_CARE_PROVIDER_SITE_OTHER): Payer: BC Managed Care – PPO | Admitting: Physician Assistant

## 2016-03-22 VITALS — BP 110/60 | HR 97 | Temp 97.2°F | Resp 14 | Ht 62.0 in | Wt 164.0 lb

## 2016-03-22 DIAGNOSIS — R531 Weakness: Secondary | ICD-10-CM

## 2016-03-22 DIAGNOSIS — B0229 Other postherpetic nervous system involvement: Secondary | ICD-10-CM | POA: Diagnosis present

## 2016-03-22 DIAGNOSIS — D649 Anemia, unspecified: Secondary | ICD-10-CM

## 2016-03-22 DIAGNOSIS — E7800 Pure hypercholesterolemia, unspecified: Secondary | ICD-10-CM | POA: Diagnosis present

## 2016-03-22 DIAGNOSIS — Z806 Family history of leukemia: Secondary | ICD-10-CM

## 2016-03-22 DIAGNOSIS — J189 Pneumonia, unspecified organism: Secondary | ICD-10-CM | POA: Insufficient documentation

## 2016-03-22 DIAGNOSIS — Z808 Family history of malignant neoplasm of other organs or systems: Secondary | ICD-10-CM

## 2016-03-22 DIAGNOSIS — G893 Neoplasm related pain (acute) (chronic): Secondary | ICD-10-CM | POA: Diagnosis present

## 2016-03-22 DIAGNOSIS — J180 Bronchopneumonia, unspecified organism: Principal | ICD-10-CM | POA: Diagnosis present

## 2016-03-22 DIAGNOSIS — C50411 Malignant neoplasm of upper-outer quadrant of right female breast: Secondary | ICD-10-CM | POA: Diagnosis present

## 2016-03-22 DIAGNOSIS — Z880 Allergy status to penicillin: Secondary | ICD-10-CM

## 2016-03-22 DIAGNOSIS — I10 Essential (primary) hypertension: Secondary | ICD-10-CM

## 2016-03-22 DIAGNOSIS — D630 Anemia in neoplastic disease: Secondary | ICD-10-CM | POA: Diagnosis present

## 2016-03-22 DIAGNOSIS — D638 Anemia in other chronic diseases classified elsewhere: Secondary | ICD-10-CM | POA: Diagnosis present

## 2016-03-22 DIAGNOSIS — B029 Zoster without complications: Secondary | ICD-10-CM | POA: Diagnosis present

## 2016-03-22 DIAGNOSIS — G9340 Encephalopathy, unspecified: Secondary | ICD-10-CM

## 2016-03-22 DIAGNOSIS — R350 Frequency of micturition: Secondary | ICD-10-CM | POA: Diagnosis not present

## 2016-03-22 DIAGNOSIS — L304 Erythema intertrigo: Secondary | ICD-10-CM | POA: Diagnosis present

## 2016-03-22 DIAGNOSIS — G9349 Other encephalopathy: Secondary | ICD-10-CM | POA: Diagnosis present

## 2016-03-22 DIAGNOSIS — G9341 Metabolic encephalopathy: Secondary | ICD-10-CM | POA: Diagnosis present

## 2016-03-22 DIAGNOSIS — Z7952 Long term (current) use of systemic steroids: Secondary | ICD-10-CM

## 2016-03-22 DIAGNOSIS — N644 Mastodynia: Secondary | ICD-10-CM | POA: Diagnosis present

## 2016-03-22 DIAGNOSIS — Z87442 Personal history of urinary calculi: Secondary | ICD-10-CM

## 2016-03-22 DIAGNOSIS — Y95 Nosocomial condition: Secondary | ICD-10-CM | POA: Diagnosis present

## 2016-03-22 DIAGNOSIS — Z8249 Family history of ischemic heart disease and other diseases of the circulatory system: Secondary | ICD-10-CM

## 2016-03-22 DIAGNOSIS — R627 Adult failure to thrive: Secondary | ICD-10-CM | POA: Insufficient documentation

## 2016-03-22 DIAGNOSIS — E785 Hyperlipidemia, unspecified: Secondary | ICD-10-CM | POA: Diagnosis present

## 2016-03-22 DIAGNOSIS — E860 Dehydration: Secondary | ICD-10-CM | POA: Diagnosis present

## 2016-03-22 DIAGNOSIS — Z9181 History of falling: Secondary | ICD-10-CM

## 2016-03-22 DIAGNOSIS — Z79899 Other long term (current) drug therapy: Secondary | ICD-10-CM

## 2016-03-22 DIAGNOSIS — R5381 Other malaise: Secondary | ICD-10-CM | POA: Insufficient documentation

## 2016-03-22 DIAGNOSIS — Z9221 Personal history of antineoplastic chemotherapy: Secondary | ICD-10-CM

## 2016-03-22 DIAGNOSIS — E46 Unspecified protein-calorie malnutrition: Secondary | ICD-10-CM | POA: Diagnosis present

## 2016-03-22 DIAGNOSIS — K219 Gastro-esophageal reflux disease without esophagitis: Secondary | ICD-10-CM | POA: Diagnosis present

## 2016-03-22 DIAGNOSIS — Z803 Family history of malignant neoplasm of breast: Secondary | ICD-10-CM

## 2016-03-22 DIAGNOSIS — I480 Paroxysmal atrial fibrillation: Secondary | ICD-10-CM | POA: Diagnosis not present

## 2016-03-22 DIAGNOSIS — R296 Repeated falls: Secondary | ICD-10-CM | POA: Diagnosis not present

## 2016-03-22 DIAGNOSIS — E876 Hypokalemia: Secondary | ICD-10-CM

## 2016-03-22 DIAGNOSIS — Z7901 Long term (current) use of anticoagulants: Secondary | ICD-10-CM

## 2016-03-22 DIAGNOSIS — I4891 Unspecified atrial fibrillation: Secondary | ICD-10-CM | POA: Diagnosis present

## 2016-03-22 DIAGNOSIS — W19XXXA Unspecified fall, initial encounter: Secondary | ICD-10-CM | POA: Diagnosis present

## 2016-03-22 DIAGNOSIS — Z87440 Personal history of urinary (tract) infections: Secondary | ICD-10-CM

## 2016-03-22 LAB — CBC
HCT: 35.2 % — ABNORMAL LOW (ref 36.0–46.0)
Hemoglobin: 10.7 g/dL — ABNORMAL LOW (ref 12.0–15.0)
MCH: 28 pg (ref 26.0–34.0)
MCHC: 30.4 g/dL (ref 30.0–36.0)
MCV: 92.1 fL (ref 78.0–100.0)
PLATELETS: 366 10*3/uL (ref 150–400)
RBC: 3.82 MIL/uL — AB (ref 3.87–5.11)
RDW: 19.5 % — AB (ref 11.5–15.5)
WBC: 3.9 10*3/uL — ABNORMAL LOW (ref 4.0–10.5)

## 2016-03-22 LAB — CBC WITH DIFFERENTIAL/PLATELET
BASOS ABS: 72 {cells}/uL (ref 0–200)
Basophils Relative: 2 %
EOS PCT: 6 %
Eosinophils Absolute: 216 cells/uL (ref 15–500)
HEMATOCRIT: 34.9 % — AB (ref 35.0–45.0)
HEMOGLOBIN: 10.7 g/dL — AB (ref 11.7–15.5)
LYMPHS ABS: 1332 {cells}/uL (ref 850–3900)
Lymphocytes Relative: 37 %
MCH: 28 pg (ref 27.0–33.0)
MCHC: 30.7 g/dL — ABNORMAL LOW (ref 32.0–36.0)
MCV: 91.4 fL (ref 80.0–100.0)
MONO ABS: 504 {cells}/uL (ref 200–950)
MPV: 9 fL (ref 7.5–12.5)
Monocytes Relative: 14 %
NEUTROS ABS: 1476 {cells}/uL — AB (ref 1500–7800)
NEUTROS PCT: 41 %
Platelets: 398 10*3/uL (ref 140–400)
RBC: 3.82 MIL/uL (ref 3.80–5.10)
RDW: 20.4 % — ABNORMAL HIGH (ref 11.0–15.0)
WBC: 3.6 10*3/uL — ABNORMAL LOW (ref 3.8–10.8)

## 2016-03-22 LAB — BASIC METABOLIC PANEL WITH GFR
BUN: 6 mg/dL — ABNORMAL LOW (ref 7–25)
CHLORIDE: 98 mmol/L (ref 98–110)
CO2: 28 mmol/L (ref 20–31)
CREATININE: 0.6 mg/dL (ref 0.50–0.99)
Calcium: 9.6 mg/dL (ref 8.6–10.4)
GFR, Est African American: >89 mL/min (ref >=60)
GLUCOSE: 95 mg/dL (ref 65–99)
Potassium: 3.7 mmol/L (ref 3.5–5.3)
SODIUM: 138 mmol/L (ref 135–146)

## 2016-03-22 LAB — I-STAT TROPONIN, ED: Troponin i, poc: 0.03 ng/mL (ref 0.00–0.08)

## 2016-03-22 LAB — HEPATIC FUNCTION PANEL
ALT: 11 U/L (ref 6–29)
AST: 30 U/L (ref 10–35)
Albumin: 3.4 g/dL — ABNORMAL LOW (ref 3.6–5.1)
Alkaline Phosphatase: 88 U/L (ref 33–130)
BILIRUBIN DIRECT: 0.1 mg/dL (ref <=0.2)
BILIRUBIN INDIRECT: 0.4 mg/dL (ref 0.2–1.2)
TOTAL PROTEIN: 7 g/dL (ref 6.1–8.1)
Total Bilirubin: 0.5 mg/dL (ref 0.2–1.2)

## 2016-03-22 LAB — URINALYSIS, ROUTINE W REFLEX MICROSCOPIC
BILIRUBIN URINE: NEGATIVE
GLUCOSE, UA: NEGATIVE mg/dL
HGB URINE DIPSTICK: NEGATIVE
KETONES UR: 15 mg/dL — AB
Leukocytes, UA: NEGATIVE
Nitrite: NEGATIVE
PROTEIN: NEGATIVE mg/dL
Specific Gravity, Urine: 1.015 (ref 1.005–1.030)
pH: 7 (ref 5.0–8.0)

## 2016-03-22 LAB — IRON AND TIBC
%SAT: 21 % (ref 11–50)
Iron: 33 ug/dL — ABNORMAL LOW (ref 45–160)
TIBC: 160 ug/dL — ABNORMAL LOW (ref 250–450)
UIBC: 127 ug/dL (ref 125–400)

## 2016-03-22 LAB — COMPREHENSIVE METABOLIC PANEL
ALBUMIN: 3.1 g/dL — AB (ref 3.5–5.0)
ALT: 17 U/L (ref 14–54)
ANION GAP: 12 (ref 5–15)
AST: 35 U/L (ref 15–41)
Alkaline Phosphatase: 89 U/L (ref 38–126)
BILIRUBIN TOTAL: 0.6 mg/dL (ref 0.3–1.2)
BUN: 5 mg/dL — ABNORMAL LOW (ref 6–20)
CALCIUM: 10 mg/dL (ref 8.9–10.3)
CHLORIDE: 99 mmol/L — AB (ref 101–111)
CO2: 25 mmol/L (ref 22–32)
CREATININE: 0.74 mg/dL (ref 0.44–1.00)
GFR calc Af Amer: >60 mL/min (ref >60)
GFR calc non Af Amer: >60 mL/min (ref >60)
GLUCOSE: 119 mg/dL — AB (ref 65–99)
POTASSIUM: 3.3 mmol/L — AB (ref 3.5–5.1)
SODIUM: 136 mmol/L (ref 135–145)
Total Protein: 7.4 g/dL (ref 6.5–8.1)

## 2016-03-22 LAB — VITAMIN B12: VITAMIN B 12: 1174 pg/mL — AB (ref 200–1100)

## 2016-03-22 LAB — FERRITIN: FERRITIN: 3066 ng/mL — AB (ref 20–288)

## 2016-03-22 LAB — TSH: TSH: 0.68 mIU/L

## 2016-03-22 MED ORDER — SODIUM CHLORIDE 0.9 % IV SOLN
INTRAVENOUS | Status: DC
Start: 2016-03-22 — End: 2016-03-25
  Administered 2016-03-23 – 2016-03-25 (×5): via INTRAVENOUS

## 2016-03-22 MED ORDER — VANCOMYCIN HCL IN DEXTROSE 1-5 GM/200ML-% IV SOLN
1000.0000 mg | Freq: Two times a day (BID) | INTRAVENOUS | Status: DC
  Administered 2016-03-23: 1000 mg via INTRAVENOUS
  Filled 2016-03-22: qty 200

## 2016-03-22 MED ORDER — FENTANYL CITRATE (PF) 100 MCG/2ML IJ SOLN
50.0000 ug | Freq: Once | INTRAMUSCULAR | Status: AC
  Administered 2016-03-23: 50 ug via INTRAVENOUS
  Filled 2016-03-22: qty 2

## 2016-03-22 MED ORDER — POTASSIUM CHLORIDE CRYS ER 20 MEQ PO TBCR
40.0000 meq | EXTENDED_RELEASE_TABLET | Freq: Once | ORAL | Status: AC
  Administered 2016-03-23: 40 meq via ORAL
  Filled 2016-03-22: qty 2

## 2016-03-22 MED ORDER — SODIUM CHLORIDE 0.9 % IV SOLN
1500.0000 mg | Freq: Once | INTRAVENOUS | Status: AC
  Administered 2016-03-23: 1500 mg via INTRAVENOUS
  Filled 2016-03-22: qty 1500

## 2016-03-22 MED ORDER — SODIUM CHLORIDE 0.9 % IV BOLUS (SEPSIS)
1000.0000 mL | Freq: Once | INTRAVENOUS | Status: AC
  Administered 2016-03-23: 1000 mL via INTRAVENOUS

## 2016-03-22 MED ORDER — DEXTROSE 5 % IV SOLN
2.0000 g | Freq: Once | INTRAVENOUS | Status: AC
  Administered 2016-03-23: 2 g via INTRAVENOUS
  Filled 2016-03-22: qty 2

## 2016-03-22 MED ORDER — ONDANSETRON HCL 4 MG/2ML IJ SOLN
4.0000 mg | Freq: Once | INTRAMUSCULAR | Status: AC
  Administered 2016-03-23: 4 mg via INTRAVENOUS
  Filled 2016-03-22: qty 2

## 2016-03-22 NOTE — ED Notes (Addendum)
Pt c/o 1 month history of n/v/d, not feeling well and not acting like herself per family. Her symptoms started after her last chemotherapy treatment in April. She is currently being treated for breast cancer at [facility] was at PCP today for check up but continued to nto feel well and vomited this afternoon so daughters brought her to ED. She c/o abd pain now. She is alert and breathing easily

## 2016-03-22 NOTE — Progress Notes (Signed)
Subjective:    Patient ID: [name], female    DOB: 23-Sep-1956, 60 y.o.   MRN: [id number]  HPI 60 y.o. AAF being treated for right breast IDC discontinued from chemo due to AE's/hospitilzations, last dose April 26th, going to see surgeon July 10th followed by radiation, patient present with daughter s/p fall 2 weeks ago. She was between the toliet and tub, states that she fell on her left side onto a trash can, no LOC, did hit head on wall but not hard per daughter. She states that she fell because her legs" gave out on her", no dizziness, CP, SOB prior to fall, she is on xarelto. She did have a fever Jun 1st that resolved, had diarrhea as well but has been taking imodium. She is taking MS cotin, taking the gabpentin.   Current Outpatient Prescriptions on File Prior to Visit  Medication Sig Dispense Refill  . atenolol (TENORMIN) 100 MG tablet take 1/2 to 1 tablet by mouth once daily as directed (Patient taking differently: takes 1 tab by mouth once daily) 90 tablet 1  . atorvastatin (LIPITOR) 80 MG tablet take 1 tablet by mouth once daily 30 tablet 3  . cholecalciferol (VITAMIN D) 1000 UNITS tablet Take 4,000 Units by mouth daily.    . clotrimazole-betamethasone (LOTRISONE) cream Apply to affected area 2 times daily (Patient taking differently: Apply 1 application topically 2 (two) times daily as needed (for irritation). Apply to affected area 2 times daily) 45 g 1  . diazepam (VALIUM) 5 MG tablet Take 5 mg by mouth every morning.     . diltiazem (CARDIZEM CD) 120 MG 24 hr capsule Take 1 capsule (120 mg total) by mouth daily. 90 capsule 3  . doxepin (SINEQUAN) 50 MG capsule take 1 capsule by mouth once daily at bedtime 30 capsule 1  . ferrous sulfate 325 (65 FE) MG tablet Take 325 mg by mouth daily with breakfast.    . gabapentin (NEURONTIN) 100 MG capsule Take 1 capsule (100 mg total) by mouth 3 (three) times daily. 90 capsule 0  . LORazepam (ATIVAN) 0.5 MG tablet Take 1 tablet (0.5 mg  total) by mouth every 6 (six) hours as needed (nausea). 30 tablet 0  . montelukast (SINGULAIR) 10 MG tablet Take 1 tablet (10 mg total) by mouth daily. 30 tablet 2  . morphine (MS CONTIN) 15 MG 12 hr tablet Take 1 tablet (15 mg total) by mouth every 12 (twelve) hours. 60 tablet 0  . ondansetron (ZOFRAN) 4 MG tablet Take 1 tablet (4 mg total) by mouth every 6 (six) hours. 12 tablet 0  . oxyCODONE-acetaminophen (PERCOCET/ROXICET) 5-325 MG tablet Take 2 tablets by mouth every 8 (eight) hours as needed for severe pain. 60 tablet 0  . PAZEO 0.7 % SOLN Place 1 drop into both eyes daily as needed (allergies).     . potassium chloride SA (K-DUR,KLOR-CON) 20 MEQ tablet Take 20 mEq by mouth 2 (two) times daily.    . prochlorperazine (COMPAZINE) 10 MG tablet Take 1 tablet (10 mg total) by mouth every 6 (six) hours as needed (Nausea or vomiting). 30 tablet 1  . rivaroxaban (XARELTO) 20 MG TABS tablet Take 1 tablet (20 mg total) by mouth daily with supper. 90 tablet 3  . triamcinolone cream (KENALOG) 0.1 % Apply 1 application topically 3 (three) times daily. 80 g 0  . triamterene-hydrochlorothiazide (MAXZIDE-25) 37.5-25 MG tablet Take 1 tablet by mouth daily.     No current facility-administered medications on  file prior to visit.   Past Medical History  Diagnosis Date  . MRSA (methicillin resistant staph aureus) culture positive 2009     none since per patient  . Eczema   . Hypercholesteremia   . Hypertension   . Kidney stones   . Anemia   . Vitamin D deficiency   . Paroxysmal atrial fibrillation ([place]) 10/2011, 06/2015  . GERD (gastroesophageal reflux disease)   . Atrial fibrillation ([place]) 2016  . Dysrhythmia     A FIB - followed by Dr. Meda Coffee  . Breast cancer of upper-outer quadrant of right female breast ([place]) 12/12/2015  . Breast cancer ([place])   . Complication of anesthesia 10-06-15     slow to awaken after   . PONV (postoperative nausea and vomiting) 10-06-15    admitted back to hospital for  dehydration  . Family history of breast cancer   . Family history of prostate cancer     Review of Systems  Constitutional: Positive for fever (resolved on the 1st) and fatigue. Negative for chills and unexpected weight change.  HENT: Negative.   Respiratory: Negative for cough, chest tightness and shortness of breath.   Cardiovascular: Negative for chest pain and palpitations.  Gastrointestinal: Negative for nausea and vomiting.  Genitourinary: Positive for frequency.  Musculoskeletal: Positive for myalgias, back pain and arthralgias.  Skin: Positive for rash.  Neurological: Positive for weakness. Negative for dizziness, tremors, seizures, syncope, facial asymmetry, speech difficulty, light-headedness, numbness and headaches.  Hematological: Negative.   Psychiatric/Behavioral: Negative.        Objective:   Physical Exam  Constitutional: She is oriented to person, place, and time. She appears well-developed and well-nourished.  HENT:  Head: Normocephalic and atraumatic.  Eyes: Conjunctivae are normal. Pupils are equal, round, and reactive to light.  Neck: Normal range of motion. Neck supple.  Cardiovascular: Normal rate and regular rhythm.   Pulmonary/Chest: Effort normal and breath sounds normal. She has no wheezes.  Abdominal: Soft. Bowel sounds are normal. There is no tenderness.  Musculoskeletal: She exhibits tenderness (bilateral lower back and all the way down her back TTP). She exhibits no edema.  Neurological: She is alert and oriented to person, place, and time. She has normal reflexes. No cranial nerve deficit. She exhibits normal muscle tone.  Skin:  2x1 inch anterior ulceration with pink granulation tissues without warmth, discharge. 5x4 ulceration on posterior left back with pink granulations tissue without discharge, warmth, or surrounding erythema.         Assessment & Plan:  1. Weakness S/p Fall normal neuro, patient is on xarelto, daughter will monitor for  signs of confusion, nausea, vomiting, dizziness - normal strength bilateral legs? If back/spinal stenosis versus weakness from deconditioning/chemo- will check labs- will restart at home PT but may need referral to ortho if continues or CT lumbar-  Patient with weakness, frequent falls, using walker, taxing effort for her to leave the home- would benefit from [facility]  - CBC with Differential/Platelet - BASIC METABOLIC PANEL WITH GFR - Hepatic function panel - TSH - Urinalysis, Routine w reflex microscopic (not at [facility]) - Urine culture - Iron and TIBC - Ferritin - Vitamin B12 - Ambulatory referral to Home Health  2. Anemia, unspecified anemia type - Iron and TIBC - Ferritin - Vitamin B12  3. Urinary frequency - Urinalysis, Routine w reflex microscopic (not at [facility]) - Urine culture  4. Frequent falls - Ambulatory referral to [place] home care

## 2016-03-22 NOTE — ED Notes (Signed)
IV team at bedside to attempt IV access. IV team attempting to obtain information regarding pt's Port-a-cath that's in place...whether it can be accessed and if it is a "power port" that can be used for CT scans.

## 2016-03-22 NOTE — ED Notes (Signed)
RN called patient x1

## 2016-03-22 NOTE — Progress Notes (Signed)
Pharmacy Antibiotic Note  [name] is a 60 y.o. female admitted on 03/22/2016 with pneumonia.  Pharmacy has been consulted for vancomycin dosing. Pt is afebrile and WBC is low at 3.9. Scr is WNL.   Plan: - Vancomycin 1500mg  IV x 1 then 1gm IV Q12H - F/u renal fxn, C&S, clinical status and trough at SS - Continue cefepime or other gram negative coverage?     Temp (24hrs), Avg:97.8 F (36.6 C), Min:97.2 F (36.2 C), Max:98.4 F (36.9 C)   Recent Labs Lab 03/22/16 1900  WBC 3.9*  CREATININE 0.74    Estimated Creatinine Clearance: 69.7 mL/min (by C-G formula based on Cr of 0.74).    Allergies  Allergen Reactions  . Penicillins Other (See Comments)    Has patient had a PCN reaction causing immediate rash, facial/tongue/throat swelling, SOB or lightheadedness with hypotension: NO Has patient had a PCN reaction causing severe rash involving mucus membranes or skin necrosis: NO Has patient had a PCN reaction that required hospitalization NO Has patient had a PCN reaction occurring within the last 10 years: NO If all of the above answers are "NO", then may proceed with Cephalosporin use.    Antimicrobials this admission: Vanc 6/5>> Cefepime x 1 6/5  Dose adjustments this admission: N/A  Microbiology results: Pending  Thank you for allowing pharmacy to be a part of this patient's care.  Alazne Quant, Rande Lawman 03/22/2016 9:52 PM

## 2016-03-22 NOTE — ED Notes (Signed)
Unable to obtain IV access. IV team called for attempt.

## 2016-03-22 NOTE — H&P (Signed)
[name] [id number] DOB: 1956-09-20 DOA: 03/22/2016     PCP: Alesia Richards, MD   Outpatient Specialists: Lindi Adie Patient coming from:     home Lives  With family Levada Dy    Chief Complaint: Confusion generalized weakness  HPI: [name] is a 60 y.o. female with medical history significant of breast cancer  Presented with generalized fatigue she had a fever last week but it has resolved. Currently had been having generalized body aches associated chest pain or shortness of breath some mild cough She had had nausea vomiting and diarrhea for past 1 month. 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 Follow-up MRI showed decrease in right breast malignancy from Freet 0.2 down to 2.9 cm the plan was for patient to undergo breast conserving surgery with axillary lymph node dissection has an appointment scheduled for July 10 of surgery. 2 weeks ago patient had a fall no LOC associated hit her head. Regarding patient's shingles she is to have poorly healed ulcerations and some pain she's been having wound care follow-up  IN ER: Heart rate up to 101 blood pressure down to 103/65 WBC 3.9 hemoglobin 10.7 potassium . 3.3 creatinine 0.74 Chest x-ray worrisome for early bronchopneumonia CT head no acute intracranial findings, mass lesion   Hospitalist was called for admission for community-acquired pneumonia  and Dehydration Review of Systems:    Pertinent positives include: Fevers, chills, fatigue, weight loss , abdominal pain,  nausea, vomiting, diarrhea,   Constitutional:  No weight loss, night sweats,  HEENT:  No headaches, Difficulty swallowing,Tooth/dental problems,Sore throat,  No sneezing, itching, ear ache, nasal congestion, post nasal drip,  Cardio-vascular:  No chest pain, Orthopnea, PND, anasarca, dizziness, palpitations.no Bilateral lower extremity swelling  GI:  No heartburn, indigestionchange in bowel habits, loss of appetite, melena, blood in stool, hematemesis Resp:  no shortness of breath at rest. No dyspnea on exertion, No excess mucus, no productive cough, No non-productive cough, No coughing up of blood.No change in color of mucus.No wheezing. Skin:  no rash or lesions. No jaundice GU:  no dysuria, change in color of urine, no urgency or frequency. No straining to urinate.  No flank pain.  Musculoskeletal:  No joint pain or no joint swelling. No decreased range of motion. No back pain.  Psych:  No change in mood or affect. No depression or anxiety. No memory loss.  Neuro: no localizing neurological complaints, no tingling, no weakness, no double vision, no gait abnormality, no slurred speech, no confusion  As per HPI otherwise 10 point review of systems negative.   Past Medical History: Past Medical History  Diagnosis Date  . MRSA (methicillin resistant staph aureus) culture positive 2009     none since per patient  . Eczema   . Hypercholesteremia   . Hypertension   . Kidney stones   . Anemia   . Vitamin D deficiency   . Paroxysmal atrial fibrillation ([place]) 10/2011, 06/2015  . GERD (gastroesophageal reflux disease)   . Atrial fibrillation ([place]) 2016  . Dysrhythmia     A FIB - followed by Dr. Meda Coffee  .  Breast cancer of upper-outer quadrant of right female breast ([place]) 12/12/2015  . Breast cancer ([place])   . Complication of anesthesia 10-06-15     slow to awaken after   . PONV (postoperative nausea and vomiting) 10-06-15    admitted back to hospital for dehydration  . Family  history of breast cancer   . Family history of prostate cancer    Past Surgical History  Procedure Laterality Date  . Breast surgery Left     biopsy-neg  . Kidney stone surgery  2013  . Nephrolithotomy Right 10/06/2015    Procedure: NEPHROLITHOTOMY PERCUTANEOUS;  Surgeon: Kathie Rhodes, MD;  Location: [facility];  Service: Urology;  Laterality: Right;  . Cystoscopy with holmium laser lithotripsy Right 10/06/2015    Procedure: CYSTOSCOPY WITH HOLMIUM LASER LITHOTRIPSY;  Surgeon: Kathie Rhodes, MD;  Location: [facility];  Service: Urology;  Laterality: Right;  . Portacath placement Left 12/23/2015    Procedure: INSERTION PORT-A-CATH WITH [place];  Surgeon: Stark Klein, MD;  Location: [facility];  Service: General;  Laterality: Left;     Social History:  Ambulatory  walker      reports that she has never smoked. She has never used smokeless tobacco. She reports that she does not drink alcohol or use illicit drugs.  Allergies:   Allergies  Allergen Reactions  . Penicillins Other (See Comments)    Has patient had a PCN reaction causing immediate rash, facial/tongue/throat swelling, SOB or lightheadedness with hypotension: NO Has patient had a PCN reaction causing severe rash involving mucus membranes or skin necrosis: NO Has patient had a PCN reaction that required hospitalization NO Has patient had a PCN reaction occurring within the last 10 years: NO If all of the above answers are "NO", then may proceed with Cephalosporin use.       Family History:    Family History  Problem Relation Age of Onset  . Hypertension Mother   . Hepatitis C Mother   . Heart disease Father   . Dementia Father   . Aneurysm Brother   . Breast cancer Paternal Aunt     dx in her 32s  . Prostate cancer Paternal Uncle     47s  . Prostate cancer Paternal Uncle     dx in his 68s  . Leukemia Paternal Aunt   . Breast cancer Cousin     mother's paternal first cousin  . Thyroid cancer Cousin     mother's paternal  first cousin    Medications: Prior to Admission medications   Medication Sig Start Date End Date Taking? Authorizing Provider  atenolol (TENORMIN) 100 MG tablet take 1/2 to 1 tablet by mouth once daily as directed Patient taking differently: takes 1 tab by mouth once daily 02/11/16   Unk Pinto, MD  atorvastatin (LIPITOR) 80 MG tablet take 1 tablet by mouth once daily 03/10/16   Unk Pinto, MD  cholecalciferol (VITAMIN D) 1000 UNITS tablet Take 4,000 Units by mouth daily.    Historical Provider, MD  clotrimazole-betamethasone (LOTRISONE) cream Apply to affected area 2 times daily Patient taking differently: Apply 1 application topically 2 (two) times daily as needed (for irritation). Apply to affected area 2 times daily 02/17/16 02/16/17  Courtney Forcucci, PA-C  diazepam (VALIUM) 5 MG tablet Take 5 mg by mouth every morning.     Historical Provider, MD  diltiazem (CARDIZEM CD) 120 MG 24 hr capsule Take 1 capsule (120 mg total) by mouth daily. 10/24/15   Dorothy Spark, MD  doxepin (  SINEQUAN) 50 MG capsule take 1 capsule by mouth once daily at bedtime 02/10/16   Vicie Mutters, PA-C  ferrous sulfate 325 (65 FE) MG tablet Take 325 mg by mouth daily with breakfast.    Historical Provider, MD  gabapentin (NEURONTIN) 100 MG capsule Take 1 capsule (100 mg total) by mouth 3 (three) times daily. 03/16/16   Courtney Forcucci, PA-C  LORazepam (ATIVAN) 0.5 MG tablet Take 1 tablet (0.5 mg total) by mouth every 6 (six) hours as needed (nausea). 02/11/16   Nicholas Lose, MD  montelukast (SINGULAIR) 10 MG tablet Take 1 tablet (10 mg total) by mouth daily. 12/26/15 12/25/16  Courtney Forcucci, PA-C  morphine (MS CONTIN) 15 MG 12 hr tablet Take 1 tablet (15 mg total) by mouth every 12 (twelve) hours. 02/26/16   Nicholas Lose, MD  ondansetron (ZOFRAN) 4 MG tablet Take 1 tablet (4 mg total) by mouth every 6 (six) hours. 03/11/16   Alfonzo Beers, MD  oxyCODONE-acetaminophen (PERCOCET/ROXICET) 5-325 MG tablet Take 2  tablets by mouth every 8 (eight) hours as needed for severe pain. 03/10/16   Nicholas Lose, MD  PAZEO 0.7 % SOLN Place 1 drop into both eyes daily as needed (allergies).  12/15/15   Historical Provider, MD  potassium chloride SA (K-DUR,KLOR-CON) 20 MEQ tablet Take 20 mEq by mouth 2 (two) times daily. 12/04/15   Historical Provider, MD  prochlorperazine (COMPAZINE) 10 MG tablet Take 1 tablet (10 mg total) by mouth every 6 (six) hours as needed (Nausea or vomiting). 02/11/16   Nicholas Lose, MD  rivaroxaban (XARELTO) 20 MG TABS tablet Take 1 tablet (20 mg total) by mouth daily with supper. 01/23/16   Theodis Blaze, MD  triamcinolone cream (KENALOG) 0.1 % Apply 1 application topically 3 (three) times daily. 03/10/16   Nicholas Lose, MD  triamterene-hydrochlorothiazide (MAXZIDE-25) 37.5-25 MG tablet Take 1 tablet by mouth daily. 02/28/16   Historical Provider, MD    Physical Exam: Patient Vitals for the past 24 hrs:  BP Temp Temp src Pulse Resp SpO2  03/22/16 2300 115/74 mmHg - - 100 20 100 %  03/22/16 2245 109/68 mmHg - - 99 - 97 %  03/22/16 2230 118/62 mmHg - - 99 - 100 %  03/22/16 2215 103/65 mmHg - - 97 - 98 %  03/22/16 2200 115/68 mmHg - - 96 - 97 %  03/22/16 2145 115/70 mmHg - - 99 - 100 %  03/22/16 2130 129/75 mmHg - - 101 - 100 %  03/22/16 1930 112/62 mmHg - - 92 23 100 %  03/22/16 1929 117/86 mmHg - - 96 16 98 %  03/22/16 1900 - - - - - 100 %  03/22/16 1758 116/81 mmHg 98.4 F (36.9 C) Oral 96 20 99 %    1. General:  in No Acute distress 2. Psychological: Alert and   Oriented 3. Head/ENT:     Dry Mucous Membranes                          Head Non traumatic, neck supple                          Normal  Dentition 4. SKIN: decreased Skin turgor,  Skin clean Dry and intact no rash 5. Heart: Regular rate and rhythm no Murmur, Rub or gallop 6. Lungs:  no wheezes some crackles   7. Abdomen: Soft, non-tender, Non distended 8. Lower extremities: no clubbing, cyanosis,  or edema 9. Neurologically  strength 5/5 in all 4 ext. CN 2-12 intact 10. MSK: Normal range of motion   body mass index is unknown because there is no weight on file.  Labs on Admission:   Labs on Admission: I have personally reviewed following labs and imaging studies  CBC:  Recent Labs Lab 03/22/16 1535 03/22/16 1900  WBC 3.6* 3.9*  NEUTROABS 1476*  --   HGB 10.7* 10.7*  HCT 34.9* 35.2*  MCV 91.4 92.1  PLT 398 [id number]   Basic Metabolic Panel:  Recent Labs Lab 03/22/16 1535 03/22/16 1900  NA 138 136  K 3.7 3.3*  CL 98 99*  CO2 28 25  GLUCOSE 95 119*  BUN 6* 5*  CREATININE 0.60 0.74  CALCIUM 9.6 10.0   GFR: Estimated Creatinine Clearance: 69.7 mL/min (by C-G formula based on Cr of 0.74). Liver Function Tests:  Recent Labs Lab 03/22/16 1535 03/22/16 1900  AST 30 35  ALT 11 17  ALKPHOS 88 89  BILITOT 0.5 0.6  PROT 7.0 7.4  ALBUMIN 3.4* 3.1*   No results for input(s): LIPASE, AMYLASE in the last 168 hours. No results for input(s): AMMONIA in the last 168 hours. Coagulation Profile: No results for input(s): INR, PROTIME in the last 168 hours. Cardiac Enzymes: No results for input(s): CKTOTAL, CKMB, CKMBINDEX, TROPONINI in the last 168 hours. BNP (last 3 results) No results for input(s): PROBNP in the last 8760 hours. HbA1C: No results for input(s): HGBA1C in the last 72 hours. CBG: No results for input(s): GLUCAP in the last 168 hours. Lipid Profile: No results for input(s): CHOL, HDL, LDLCALC, TRIG, CHOLHDL, LDLDIRECT in the last 72 hours. Thyroid Function Tests:  Recent Labs  03/22/16 1535  TSH 0.68   Anemia Panel:  Recent Labs  03/22/16 1535  VITAMINB12 1174*  FERRITIN 3066*  TIBC 160*  IRON 33*   Urine analysis:    Component Value Date/Time   COLORURINE YELLOW 03/22/2016 2153   APPEARANCEUR CLEAR 03/22/2016 2153   LABSPEC 1.015 03/22/2016 2153   PHURINE 7.0 03/22/2016 2153   GLUCOSEU NEGATIVE 03/22/2016 2153   HGBUR NEGATIVE 03/22/2016 2153   BILIRUBINUR  NEGATIVE 03/22/2016 2153   KETONESUR 15* 03/22/2016 2153   PROTEINUR NEGATIVE 03/22/2016 2153   UROBILINOGEN 0.2 08/03/2015 0725   NITRITE NEGATIVE 03/22/2016 2153   LEUKOCYTESUR NEGATIVE 03/22/2016 2153   Sepsis Labs: @LABRCNTIP (procalcitonin:4,lacticidven:4) )No results found for this or any previous visit (from the past 240 hour(s)).   UA   no evidence of UTI  Lab Results  Component Value Date   HGBA1C 6.1* 01/27/2016    Estimated Creatinine Clearance: 69.7 mL/min (by C-G formula based on Cr of 0.74).  BNP (last 3 results) No results for input(s): PROBNP in the last 8760 hours.   ECG REPORT Not obtained  There were no vitals filed for this visit.   Cultures:    Component Value Date/Time   SDES URINE, CATHETERIZED 02/21/2016 2140   SPECREQUEST NONE 02/21/2016 2140   CULT NO GROWTH 2 DAYS 02/21/2016 2140   REPTSTATUS 02/23/2016 FINAL 02/21/2016 2140     Radiological Exams on Admission: Dg Chest 2 View  03/22/2016  CLINICAL DATA:  [place] tonight.  Weakness. EXAM: CHEST  2 VIEW COMPARISON:  Chest x-ray 02/23/2016 and chest CT 01/30/2016 FINDINGS: The Port-A-Cath is stable. Right perihilar and right upper lobe peribronchial thickening could reflect bronchitis or early bronchopneumonia. Radiation changes are also possible. The left lung is relatively clear. No pleural effusions. Stable mild  right-sided pleural thickening. The bony thorax is intact. IMPRESSION: Right perihilar and right upper lobe peribronchial thickening and increased interstitial markings could reflect early bronchopneumonia or radiation changes. Electronically Signed   By: Marijo Sanes M.D.   On: 03/22/2016 20:35   Ct Head Wo Contrast  03/22/2016  CLINICAL DATA:  [place] 1 week ago and hit head. Generalized weakness. EXAM: CT HEAD WITHOUT CONTRAST TECHNIQUE: Contiguous axial images were obtained from the base of the skull through the vertex without intravenous contrast. COMPARISON:  07/27/2008 FINDINGS: The  ventricles are normal in size and configuration. No extra-axial fluid collections are identified. The gray-white differentiation is normal. No CT findings for acute intracranial process such as hemorrhage or infarction. No mass lesions. The brainstem and cerebellum are grossly normal. The bony structures are intact. Hyperostosis frontalis interna noted. The paranasal sinuses and mastoid air cells are clear. The globes are intact. IMPRESSION: No acute intracranial findings or mass lesion. Electronically Signed   By: Marijo Sanes M.D.   On: 03/22/2016 20:52    Chart has been reviewed    Assessment/Plan  60 y.o. female with medical history significant of breast cancer care of community-acquired pneumonia altered mental status and dehydration   Present on Admission:  .HCAP (community acquired pneumonia) - - will admit for treatment of HCAP will start on appropriate antibiotic coverage.   Obtain sputum cultures, blood cultures if febrile or if decompensates.  Provide oxygen as needed.  . Atrial fibrillation ([place]) - chronic currently on anticoagulation continue atenalol, cardizem, xarelto CHA2DS2 Vasc score - 2 . Breast cancer of upper-outer quadrant of right female breast [facility]) - notify oncology patient is here  . Essential hypertension - somewhat soft blood pressure hold home medications  . Hypokalemia we'll replace check magnesium level  dehydration - will rehydrate  . Acute encephalopathy secondary to pneumonia and dehydration if persists may need MRI of the brain given history of breast cancer   Other plan as per orders.  DVT prophylaxis:  xarelto  Code Status:  FULL CODE  as per patient    Family Communication:   Family   at  Bedside  plan of care was discussed with  Daughter Gretchen Portela [phone], not at bed side main caregiver Levada Dy [phone]  Disposition Plan:    To home once workup is complete and patient is stable   Consults called: add Oncology to team  Admission  status:    inpatient      Level of care    tele         Luis M. Cintron 03/23/2016, 1:09 AM    [facility]  Pager [phone]   after 2 AM please page floor coverage PA If 7AM-7PM, please contact the day team taking care of the patient  [email or web address]  Password [email or web address]

## 2016-03-22 NOTE — ED Provider Notes (Signed)
CSN: [id number]     Arrival date & time 03/22/16  1727 History   First MD Initiated Contact with Patient 03/22/16 1945     Chief Complaint  Patient presents with  . Emesis    HPI  [name] is an 60 y.o. female with history of breast cancer, afib (on Xarelto), HTN, HLD, who presents to the ED for evaluation of altered mental status, generalized weakness, and nausea/vomiting. Her history is provided mostly by her daughters who accompany her in the room. Pt reportedly had fourth round of chemo near the end of April 2017 and unfortunately did not tolerate it well so was taken off of chemo therapy by her oncologist (has surgery consult scheduled for early July). Since that treatment pt has reportedly been generally weak. In early May 2017 pt was hospitalized due to sepsis secondary to cellulitis and shingles outbreak. Since her discharge pt's daughters state that her skin wounds have been slow to heal and continued to be painful for her, which her PCP diagnosed as postherpetic neuralgia. Pt's daughters state that today they brought pt to the ED as she has been acting "funny" at home. They state that at times pt will ramble on and talk about things that don't make sense, but then will return back to baseline mentation. Pt has also been complaining of nausea and had one episode of NBNB emesis today. Her daughters state that pt has also been progressively weaker and even had a fall about a week ago when she was trying to use the restroom. They state that the don't think she hit her head but they are concerned that pt is too weak to be at home and they cannot take care of her. Prior to today pt was able to ambulate using a walker but she has been unable to do so today. Her daughters report pt has had similar presentation with UTIs in the past but she has not complained of any urinary symptoms recently.  Her daughters do state pt has been coughing here and there at home but they are unsure how long that has  been going on.  Past Medical History  Diagnosis Date  . MRSA (methicillin resistant staph aureus) culture positive 2009     none since per patient  . Eczema   . Hypercholesteremia   . Hypertension   . Kidney stones   . Anemia   . Vitamin D deficiency   . Paroxysmal atrial fibrillation ([place]) 10/2011, 06/2015  . GERD (gastroesophageal reflux disease)   . Atrial fibrillation ([place]) 2016  . Dysrhythmia     A FIB - followed by Dr. Meda Coffee  . Breast cancer of upper-outer quadrant of right female breast ([place]) 12/12/2015  . Breast cancer ([place])   . Complication of anesthesia 10-06-15     slow to awaken after   . PONV (postoperative nausea and vomiting) 10-06-15    admitted back to hospital for dehydration  . Family history of breast cancer   . Family history of prostate cancer    Past Surgical History  Procedure Laterality Date  . Breast surgery Left     biopsy-neg  . Kidney stone surgery  2013  . Nephrolithotomy Right 10/06/2015    Procedure: NEPHROLITHOTOMY PERCUTANEOUS;  Surgeon: Kathie Rhodes, MD;  Location: [facility];  Service: Urology;  Laterality: Right;  . Cystoscopy with holmium laser lithotripsy Right 10/06/2015    Procedure: CYSTOSCOPY WITH HOLMIUM LASER LITHOTRIPSY;  Surgeon: Kathie Rhodes, MD;  Location: [facility];  Service:  Urology;  Laterality: Right;  . Portacath placement Left 12/23/2015    Procedure: INSERTION PORT-A-CATH WITH [place];  Surgeon: Stark Klein, MD;  Location: [facility];  Service: General;  Laterality: Left;   Family History  Problem Relation Age of Onset  . Hypertension Mother   . Hepatitis C Mother   . Heart disease Father   . Dementia Father   . Aneurysm Brother   . Breast cancer Paternal Aunt     dx in her 27s  . Prostate cancer Paternal Uncle     31s  . Prostate cancer Paternal Uncle     dx in his 73s  . Leukemia Paternal Aunt   . Breast cancer Cousin     mother's paternal first cousin  . Thyroid cancer Cousin     mother's paternal first cousin   Social  History  Substance Use Topics  . Smoking status: Never Smoker   . Smokeless tobacco: Never Used  . Alcohol Use: No   OB History    No data available     Review of Systems  All other systems reviewed and are negative.     Allergies  Penicillins  Home Medications   Prior to Admission medications   Medication Sig Start Date End Date Taking? Authorizing Provider  atenolol (TENORMIN) 100 MG tablet take 1/2 to 1 tablet by mouth once daily as directed Patient taking differently: takes 1 tab by mouth once daily 02/11/16   Unk Pinto, MD  atorvastatin (LIPITOR) 80 MG tablet take 1 tablet by mouth once daily 03/10/16   Unk Pinto, MD  cholecalciferol (VITAMIN D) 1000 UNITS tablet Take 4,000 Units by mouth daily.    Historical Provider, MD  clotrimazole-betamethasone (LOTRISONE) cream Apply to affected area 2 times daily Patient taking differently: Apply 1 application topically 2 (two) times daily as needed (for irritation). Apply to affected area 2 times daily 02/17/16 02/16/17  Courtney Forcucci, PA-C  diazepam (VALIUM) 5 MG tablet Take 5 mg by mouth every morning.     Historical Provider, MD  diltiazem (CARDIZEM CD) 120 MG 24 hr capsule Take 1 capsule (120 mg total) by mouth daily. 10/24/15   Dorothy Spark, MD  doxepin (SINEQUAN) 50 MG capsule take 1 capsule by mouth once daily at bedtime 02/10/16   Vicie Mutters, PA-C  ferrous sulfate 325 (65 FE) MG tablet Take 325 mg by mouth daily with breakfast.    Historical Provider, MD  gabapentin (NEURONTIN) 100 MG capsule Take 1 capsule (100 mg total) by mouth 3 (three) times daily. 03/16/16   Courtney Forcucci, PA-C  LORazepam (ATIVAN) 0.5 MG tablet Take 1 tablet (0.5 mg total) by mouth every 6 (six) hours as needed (nausea). 02/11/16   Nicholas Lose, MD  montelukast (SINGULAIR) 10 MG tablet Take 1 tablet (10 mg total) by mouth daily. 12/26/15 12/25/16  Courtney Forcucci, PA-C  morphine (MS CONTIN) 15 MG 12 hr tablet Take 1 tablet (15 mg total)  by mouth every 12 (twelve) hours. 02/26/16   Nicholas Lose, MD  ondansetron (ZOFRAN) 4 MG tablet Take 1 tablet (4 mg total) by mouth every 6 (six) hours. 03/11/16   Alfonzo Beers, MD  oxyCODONE-acetaminophen (PERCOCET/ROXICET) 5-325 MG tablet Take 2 tablets by mouth every 8 (eight) hours as needed for severe pain. 03/10/16   Nicholas Lose, MD  PAZEO 0.7 % SOLN Place 1 drop into both eyes daily as needed (allergies).  12/15/15   Historical Provider, MD  potassium chloride SA (K-DUR,KLOR-CON) 20 MEQ tablet Take 20  mEq by mouth 2 (two) times daily. 12/04/15   Historical Provider, MD  prochlorperazine (COMPAZINE) 10 MG tablet Take 1 tablet (10 mg total) by mouth every 6 (six) hours as needed (Nausea or vomiting). 02/11/16   Nicholas Lose, MD  rivaroxaban (XARELTO) 20 MG TABS tablet Take 1 tablet (20 mg total) by mouth daily with supper. 01/23/16   Theodis Blaze, MD  triamcinolone cream (KENALOG) 0.1 % Apply 1 application topically 3 (three) times daily. 03/10/16   Nicholas Lose, MD  triamterene-hydrochlorothiazide (MAXZIDE-25) 37.5-25 MG tablet Take 1 tablet by mouth daily. 02/28/16   Historical Provider, MD   BP 117/86 mmHg  Pulse 96  Temp(Src) 98.4 F (36.9 C) (Oral)  Resp 16  SpO2 98% Physical Exam  Constitutional: She is oriented to person, place, and time.  Chronically ill appearing, NAD  HENT:  Right Ear: External ear normal.  Left Ear: External ear normal.  Nose: Nose normal.  Mouth/Throat: Oropharynx is clear and moist. No oropharyngeal exudate.  Eyes: Conjunctivae and EOM are normal. Pupils are equal, round, and reactive to light.  Neck: Normal range of motion. Neck supple.  Cardiovascular: Normal rate, regular rhythm, normal heart sounds and intact distal pulses.   Pulmonary/Chest: Effort normal and breath sounds normal. No respiratory distress. She has no wheezes. She exhibits no tenderness.  Abdominal: Soft. Bowel sounds are normal. She exhibits no distension. There is no tenderness. There is  no rebound and no guarding.  No CVA tenderness  Musculoskeletal: She exhibits no edema.  Neurological: She is alert and oriented to person, place, and time. No cranial nerve deficit.  Alert & oriented x 3 though will at times mumble incoherently Moves all extremities freely. 5/5 strength in bilateral UE and LE.   Skin: Skin is warm and dry.  L chest wall L posterior thorax with dressed, raw areas of skin, no purulent drainage  Psychiatric: She has a normal mood and affect.  Nursing note and vitals reviewed.   ED Course  Procedures (including critical care time) Labs Review Labs Reviewed  COMPREHENSIVE METABOLIC PANEL - Abnormal; Notable for the following:    Potassium 3.3 (*)    Chloride 99 (*)    Glucose, Bld 119 (*)    BUN 5 (*)    Albumin 3.1 (*)    All other components within normal limits  CBC - Abnormal; Notable for the following:    WBC 3.9 (*)    RBC 3.82 (*)    Hemoglobin 10.7 (*)    HCT 35.2 (*)    RDW 19.5 (*)    All other components within normal limits  URINALYSIS, ROUTINE W REFLEX MICROSCOPIC (NOT AT [facility]) - Abnormal; Notable for the following:    Ketones, ur 15 (*)    All other components within normal limits  CULTURE, BLOOD (ROUTINE X 2)  CULTURE, BLOOD (ROUTINE X 2)  URINE CULTURE  I-STAT TROPOININ, ED  I-STAT CG4 LACTIC ACID, ED    Imaging Review Dg Chest 2 View  03/22/2016  CLINICAL DATA:  [place] tonight.  Weakness. EXAM: CHEST  2 VIEW COMPARISON:  Chest x-ray 02/23/2016 and chest CT 01/30/2016 FINDINGS: The Port-A-Cath is stable. Right perihilar and right upper lobe peribronchial thickening could reflect bronchitis or early bronchopneumonia. Radiation changes are also possible. The left lung is relatively clear. No pleural effusions. Stable mild right-sided pleural thickening. The bony thorax is intact. IMPRESSION: Right perihilar and right upper lobe peribronchial thickening and increased interstitial markings could reflect early bronchopneumonia or  radiation changes. Electronically Signed   By: Marijo Sanes M.D.   On: 03/22/2016 20:35   Ct Head Wo Contrast  03/22/2016  CLINICAL DATA:  [place] 1 week ago and hit head. Generalized weakness. EXAM: CT HEAD WITHOUT CONTRAST TECHNIQUE: Contiguous axial images were obtained from the base of the skull through the vertex without intravenous contrast. COMPARISON:  07/27/2008 FINDINGS: The ventricles are normal in size and configuration. No extra-axial fluid collections are identified. The gray-white differentiation is normal. No CT findings for acute intracranial process such as hemorrhage or infarction. No mass lesions. The brainstem and cerebellum are grossly normal. The bony structures are intact. Hyperostosis frontalis interna noted. The paranasal sinuses and mastoid air cells are clear. The globes are intact. IMPRESSION: No acute intracranial findings or mass lesion. Electronically Signed   By: Marijo Sanes M.D.   On: 03/22/2016 20:52   I have personally reviewed and evaluated these images and lab results as part of my medical decision-making.   EKG Interpretation None      MDM   Final diagnoses:  HCAP (healthcare-associated pneumonia)  Generalized weakness    [name] is an 60 y.o. female who presents to the ED with her daughters tonight for evaluation of generalized weakness and AMS. She is alert and oriented in the ED though will at times trail off with unintelligble speech. Initial labs reveal a mild hypokalemia of 3.3, WBC 3.9, H/H 10.7, 35.2. CT head and CXR were obtained given recent fall on Xarelto and family concerns about altered mental status, though she has no focal neuro deficits on exam and has had ongoing generalized weakness since her last chemo session in April. Ct head was negative. CXR concerning for possible early bronchpneumonia. Will initiate abx therapy for HCAP given hospitalization last month. UA is pending. Cultures ordered and are pending. Lactic acid is pending.  Given mentation altered from baseline, generalized weakness and inability of pt's family members to continue care at home will call for medical admission.  I spoke with Dr. Roel Cluck who will see pt and admit to medicine. Will add mag, replete potassium. Appreciate assistance.  Anne Ng, PA-C 03/22/16 2348  Daleen Bo, MD 03/23/16 [phone]

## 2016-03-22 NOTE — ED Notes (Signed)
Pt also c/o active shingles to L back, weeping clear drainage now. Reports severe pain at site.

## 2016-03-23 ENCOUNTER — Other Ambulatory Visit: Payer: Self-pay

## 2016-03-23 ENCOUNTER — Encounter (HOSPITAL_COMMUNITY): Payer: Self-pay | Admitting: Internal Medicine

## 2016-03-23 ENCOUNTER — Institutional Professional Consult (permissible substitution): Payer: BC Managed Care – PPO | Admitting: Internal Medicine

## 2016-03-23 DIAGNOSIS — B0229 Other postherpetic nervous system involvement: Secondary | ICD-10-CM | POA: Diagnosis present

## 2016-03-23 DIAGNOSIS — E46 Unspecified protein-calorie malnutrition: Secondary | ICD-10-CM | POA: Diagnosis present

## 2016-03-23 DIAGNOSIS — Z8249 Family history of ischemic heart disease and other diseases of the circulatory system: Secondary | ICD-10-CM | POA: Diagnosis not present

## 2016-03-23 DIAGNOSIS — Z803 Family history of malignant neoplasm of breast: Secondary | ICD-10-CM | POA: Diagnosis not present

## 2016-03-23 DIAGNOSIS — Z7901 Long term (current) use of anticoagulants: Secondary | ICD-10-CM | POA: Diagnosis not present

## 2016-03-23 DIAGNOSIS — L304 Erythema intertrigo: Secondary | ICD-10-CM | POA: Diagnosis present

## 2016-03-23 DIAGNOSIS — Y95 Nosocomial condition: Secondary | ICD-10-CM

## 2016-03-23 DIAGNOSIS — J189 Pneumonia, unspecified organism: Secondary | ICD-10-CM | POA: Diagnosis not present

## 2016-03-23 DIAGNOSIS — E876 Hypokalemia: Secondary | ICD-10-CM | POA: Diagnosis present

## 2016-03-23 DIAGNOSIS — Z9221 Personal history of antineoplastic chemotherapy: Secondary | ICD-10-CM | POA: Diagnosis not present

## 2016-03-23 DIAGNOSIS — Z806 Family history of leukemia: Secondary | ICD-10-CM | POA: Diagnosis not present

## 2016-03-23 DIAGNOSIS — D638 Anemia in other chronic diseases classified elsewhere: Secondary | ICD-10-CM | POA: Diagnosis present

## 2016-03-23 DIAGNOSIS — Z9181 History of falling: Secondary | ICD-10-CM | POA: Diagnosis not present

## 2016-03-23 DIAGNOSIS — W19XXXA Unspecified fall, initial encounter: Secondary | ICD-10-CM | POA: Diagnosis present

## 2016-03-23 DIAGNOSIS — C50411 Malignant neoplasm of upper-outer quadrant of right female breast: Secondary | ICD-10-CM | POA: Diagnosis present

## 2016-03-23 DIAGNOSIS — G893 Neoplasm related pain (acute) (chronic): Secondary | ICD-10-CM | POA: Diagnosis present

## 2016-03-23 DIAGNOSIS — E860 Dehydration: Secondary | ICD-10-CM | POA: Diagnosis present

## 2016-03-23 DIAGNOSIS — B029 Zoster without complications: Secondary | ICD-10-CM | POA: Diagnosis not present

## 2016-03-23 DIAGNOSIS — G9349 Other encephalopathy: Secondary | ICD-10-CM | POA: Diagnosis present

## 2016-03-23 DIAGNOSIS — J180 Bronchopneumonia, unspecified organism: Principal | ICD-10-CM

## 2016-03-23 DIAGNOSIS — Z79899 Other long term (current) drug therapy: Secondary | ICD-10-CM | POA: Diagnosis not present

## 2016-03-23 DIAGNOSIS — I10 Essential (primary) hypertension: Secondary | ICD-10-CM | POA: Diagnosis present

## 2016-03-23 DIAGNOSIS — G9340 Encephalopathy, unspecified: Secondary | ICD-10-CM | POA: Diagnosis not present

## 2016-03-23 DIAGNOSIS — K219 Gastro-esophageal reflux disease without esophagitis: Secondary | ICD-10-CM | POA: Diagnosis present

## 2016-03-23 DIAGNOSIS — I480 Paroxysmal atrial fibrillation: Secondary | ICD-10-CM | POA: Diagnosis present

## 2016-03-23 DIAGNOSIS — R5381 Other malaise: Secondary | ICD-10-CM | POA: Diagnosis not present

## 2016-03-23 DIAGNOSIS — Z880 Allergy status to penicillin: Secondary | ICD-10-CM | POA: Diagnosis not present

## 2016-03-23 DIAGNOSIS — E785 Hyperlipidemia, unspecified: Secondary | ICD-10-CM | POA: Diagnosis present

## 2016-03-23 DIAGNOSIS — R627 Adult failure to thrive: Secondary | ICD-10-CM | POA: Diagnosis present

## 2016-03-23 DIAGNOSIS — Z808 Family history of malignant neoplasm of other organs or systems: Secondary | ICD-10-CM | POA: Diagnosis not present

## 2016-03-23 DIAGNOSIS — Z7952 Long term (current) use of systemic steroids: Secondary | ICD-10-CM | POA: Diagnosis not present

## 2016-03-23 DIAGNOSIS — N644 Mastodynia: Secondary | ICD-10-CM | POA: Diagnosis present

## 2016-03-23 DIAGNOSIS — D630 Anemia in neoplastic disease: Secondary | ICD-10-CM | POA: Diagnosis present

## 2016-03-23 DIAGNOSIS — Z87442 Personal history of urinary calculi: Secondary | ICD-10-CM | POA: Diagnosis not present

## 2016-03-23 DIAGNOSIS — E7800 Pure hypercholesterolemia, unspecified: Secondary | ICD-10-CM | POA: Diagnosis present

## 2016-03-23 DIAGNOSIS — C50911 Malignant neoplasm of unspecified site of right female breast: Secondary | ICD-10-CM

## 2016-03-23 DIAGNOSIS — Z87440 Personal history of urinary (tract) infections: Secondary | ICD-10-CM | POA: Diagnosis not present

## 2016-03-23 DIAGNOSIS — R531 Weakness: Secondary | ICD-10-CM | POA: Diagnosis present

## 2016-03-23 DIAGNOSIS — G9341 Metabolic encephalopathy: Secondary | ICD-10-CM | POA: Diagnosis present

## 2016-03-23 LAB — URINALYSIS, MICROSCOPIC ONLY
Crystals: NONE SEEN [HPF]
YEAST: NONE SEEN [HPF]

## 2016-03-23 LAB — URINALYSIS, ROUTINE W REFLEX MICROSCOPIC
BILIRUBIN URINE: NEGATIVE
GLUCOSE, UA: NEGATIVE
Hgb urine dipstick: NEGATIVE
Nitrite: NEGATIVE
PH: 7.5 (ref 5.0–8.0)
SPECIFIC GRAVITY, URINE: 1.022 (ref 1.001–1.035)

## 2016-03-23 LAB — CBC WITH DIFFERENTIAL/PLATELET
BASOS ABS: 0 10*3/uL (ref 0.0–0.1)
BASOS PCT: 1 %
Basophils Absolute: 0.1 10*3/uL (ref 0.0–0.1)
Basophils Relative: 1 %
Eosinophils Absolute: 0 10*3/uL (ref 0.0–0.7)
Eosinophils Absolute: 0.1 10*3/uL (ref 0.0–0.7)
Eosinophils Relative: 1 %
Eosinophils Relative: 3 %
HCT: 26.6 % — ABNORMAL LOW (ref 36.0–46.0)
HEMATOCRIT: 31.1 % — AB (ref 36.0–46.0)
HEMOGLOBIN: 8 g/dL — AB (ref 12.0–15.0)
Hemoglobin: 9.5 g/dL — ABNORMAL LOW (ref 12.0–15.0)
LYMPHS ABS: 1.1 10*3/uL (ref 0.7–4.0)
LYMPHS PCT: 43 %
Lymphocytes Relative: 40 %
Lymphs Abs: 1.7 10*3/uL (ref 0.7–4.0)
MCH: 27.9 pg (ref 26.0–34.0)
MCH: 28.4 pg (ref 26.0–34.0)
MCHC: 30.1 g/dL (ref 30.0–36.0)
MCHC: 30.5 g/dL (ref 30.0–36.0)
MCV: 92.7 fL (ref 78.0–100.0)
MCV: 93.1 fL (ref 78.0–100.0)
MONO ABS: 0.6 10*3/uL (ref 0.1–1.0)
MONOS PCT: 16 %
MONOS PCT: 17 %
Monocytes Absolute: 0.5 10*3/uL (ref 0.1–1.0)
NEUTROS ABS: 1.1 10*3/uL — AB (ref 1.7–7.7)
NEUTROS ABS: 1.6 10*3/uL — AB (ref 1.7–7.7)
Neutrophils Relative %: 39 %
Neutrophils Relative %: 39 %
Platelets: 365 10*3/uL (ref 150–400)
Platelets: 297 10*3/uL (ref 150–400)
RBC: 3.34 MIL/uL — ABNORMAL LOW (ref 3.87–5.11)
RBC: 2.87 MIL/uL — ABNORMAL LOW (ref 3.87–5.11)
RDW: 19.7 % — ABNORMAL HIGH (ref 11.5–15.5)
RDW: 19.8 % — AB (ref 11.5–15.5)
WBC: 4 10*3/uL (ref 4.0–10.5)
WBC: 2.8 10*3/uL — ABNORMAL LOW (ref 4.0–10.5)

## 2016-03-23 LAB — COMPREHENSIVE METABOLIC PANEL
ALBUMIN: 2.3 g/dL — AB (ref 3.5–5.0)
ALK PHOS: 66 U/L (ref 38–126)
ALT: 14 U/L (ref 14–54)
AST: 24 U/L (ref 15–41)
Anion gap: 8 (ref 5–15)
BILIRUBIN TOTAL: 0.7 mg/dL (ref 0.3–1.2)
BUN: 7 mg/dL (ref 6–20)
CALCIUM: 8.8 mg/dL — AB (ref 8.9–10.3)
CO2: 24 mmol/L (ref 22–32)
Chloride: 105 mmol/L (ref 101–111)
Creatinine, Ser: 0.69 mg/dL (ref 0.44–1.00)
GFR calc Af Amer: >60 mL/min (ref >60)
GFR calc non Af Amer: >60 mL/min (ref >60)
GLUCOSE: 90 mg/dL (ref 65–99)
POTASSIUM: 3 mmol/L — AB (ref 3.5–5.1)
Sodium: 137 mmol/L (ref 135–145)
TOTAL PROTEIN: 5.5 g/dL — AB (ref 6.5–8.1)

## 2016-03-23 LAB — URINE CULTURE: Colony Count: 30000

## 2016-03-23 LAB — MRSA PCR SCREENING: MRSA BY PCR: NEGATIVE

## 2016-03-23 LAB — MAGNESIUM: Magnesium: 1.7 mg/dL (ref 1.7–2.4)

## 2016-03-23 LAB — I-STAT CG4 LACTIC ACID, ED: Lactic Acid, Venous: 1.27 mmol/L (ref 0.5–2.0)

## 2016-03-23 MED ORDER — GUAIFENESIN ER 600 MG PO TB12
600.0000 mg | ORAL_TABLET | Freq: Two times a day (BID) | ORAL | Status: DC
  Administered 2016-03-23 – 2016-03-26 (×7): 600 mg via ORAL
  Filled 2016-03-23 (×7): qty 1

## 2016-03-23 MED ORDER — MORPHINE SULFATE ER 15 MG PO TBCR
15.0000 mg | EXTENDED_RELEASE_TABLET | Freq: Every day | ORAL | Status: DC
  Administered 2016-03-24 – 2016-03-26 (×3): 15 mg via ORAL
  Filled 2016-03-23 (×3): qty 1

## 2016-03-23 MED ORDER — ATORVASTATIN CALCIUM 40 MG PO TABS
80.0000 mg | ORAL_TABLET | Freq: Every day | ORAL | Status: DC
  Administered 2016-03-23 – 2016-03-26 (×4): 80 mg via ORAL
  Filled 2016-03-23 (×4): qty 2

## 2016-03-23 MED ORDER — FERROUS SULFATE 325 (65 FE) MG PO TABS
325.0000 mg | ORAL_TABLET | Freq: Every day | ORAL | Status: DC
  Administered 2016-03-23 – 2016-03-26 (×4): 325 mg via ORAL
  Filled 2016-03-23 (×4): qty 1

## 2016-03-23 MED ORDER — DIAZEPAM 2 MG PO TABS
2.0000 mg | ORAL_TABLET | Freq: Every day | ORAL | Status: DC
  Administered 2016-03-23 – 2016-03-25 (×3): 2 mg via ORAL
  Filled 2016-03-23 (×3): qty 1

## 2016-03-23 MED ORDER — LORAZEPAM 0.5 MG PO TABS: 0.5000 mg | ORAL_TABLET | Freq: Four times a day (QID) | ORAL | Status: DC | PRN

## 2016-03-23 MED ORDER — RIVAROXABAN 20 MG PO TABS
20.0000 mg | ORAL_TABLET | Freq: Every day | ORAL | Status: DC
  Administered 2016-03-23 – 2016-03-25 (×3): 20 mg via ORAL
  Filled 2016-03-23 (×3): qty 1

## 2016-03-23 MED ORDER — OXYCODONE-ACETAMINOPHEN 5-325 MG PO TABS
2.0000 | ORAL_TABLET | Freq: Three times a day (TID) | ORAL | Status: DC | PRN
  Administered 2016-03-23 – 2016-03-26 (×7): 2 via ORAL
  Filled 2016-03-23 (×7): qty 2

## 2016-03-23 MED ORDER — DILTIAZEM HCL ER COATED BEADS 120 MG PO CP24
120.0000 mg | ORAL_CAPSULE | Freq: Every day | ORAL | Status: DC
  Administered 2016-03-23 – 2016-03-26 (×3): 120 mg via ORAL
  Filled 2016-03-23 (×3): qty 1

## 2016-03-23 MED ORDER — POTASSIUM CHLORIDE 10 MEQ/100ML IV SOLN
10.0000 meq | INTRAVENOUS | Status: AC
  Administered 2016-03-23 (×6): 10 meq via INTRAVENOUS
  Filled 2016-03-23 (×6): qty 100

## 2016-03-23 MED ORDER — MORPHINE SULFATE ER 15 MG PO TBCR
15.0000 mg | EXTENDED_RELEASE_TABLET | Freq: Two times a day (BID) | ORAL | Status: DC
  Administered 2016-03-23: 15 mg via ORAL
  Filled 2016-03-23: qty 1

## 2016-03-23 MED ORDER — DEXTROSE 5 % IV SOLN
1.0000 g | Freq: Three times a day (TID) | INTRAVENOUS | Status: DC
  Administered 2016-03-23 – 2016-03-25 (×7): 1 g via INTRAVENOUS
  Filled 2016-03-23 (×11): qty 1

## 2016-03-23 MED ORDER — GABAPENTIN 100 MG PO CAPS
100.0000 mg | ORAL_CAPSULE | Freq: Three times a day (TID) | ORAL | Status: DC
  Administered 2016-03-23 – 2016-03-25 (×8): 100 mg via ORAL
  Filled 2016-03-23 (×8): qty 1

## 2016-03-23 MED ORDER — DOXEPIN HCL 50 MG PO CAPS
50.0000 mg | ORAL_CAPSULE | Freq: Every day | ORAL | Status: DC
  Administered 2016-03-23 – 2016-03-25 (×3): 50 mg via ORAL
  Filled 2016-03-23 (×4): qty 1

## 2016-03-23 MED ORDER — DEXTROSE 5 % IV SOLN
10.0000 mg/kg | Freq: Three times a day (TID) | INTRAVENOUS | Status: DC
  Administered 2016-03-23: 605 mg via INTRAVENOUS
  Filled 2016-03-23 (×2): qty 12.1

## 2016-03-23 MED ORDER — ALBUTEROL SULFATE (2.5 MG/3ML) 0.083% IN NEBU: 2.5000 mg | INHALATION_SOLUTION | RESPIRATORY_TRACT | Status: DC | PRN

## 2016-03-23 MED ORDER — SODIUM CHLORIDE 0.9% FLUSH
10.0000 mL | INTRAVENOUS | Status: DC | PRN
  Administered 2016-03-24 – 2016-03-25 (×2): 10 mL
  Filled 2016-03-23 (×2): qty 40

## 2016-03-23 MED ORDER — VALACYCLOVIR HCL 500 MG PO TABS
1000.0000 mg | ORAL_TABLET | Freq: Three times a day (TID) | ORAL | Status: DC
  Administered 2016-03-23 – 2016-03-26 (×9): 1000 mg via ORAL
  Filled 2016-03-23 (×11): qty 2

## 2016-03-23 MED ORDER — MONTELUKAST SODIUM 10 MG PO TABS
10.0000 mg | ORAL_TABLET | Freq: Every day | ORAL | Status: DC
  Administered 2016-03-23 – 2016-03-26 (×4): 10 mg via ORAL
  Filled 2016-03-23 (×4): qty 1

## 2016-03-23 MED ORDER — ATENOLOL 50 MG PO TABS
100.0000 mg | ORAL_TABLET | Freq: Every day | ORAL | Status: DC
  Administered 2016-03-23 – 2016-03-26 (×3): 100 mg via ORAL
  Filled 2016-03-23 (×3): qty 2

## 2016-03-23 NOTE — Progress Notes (Signed)
PT Cancellation Note  Patient Details Name: [name] MRN: [id number] DOB: 24-Jan-1956   Cancelled Treatment:    Reason Eval/Treat Not Completed: Other (comment) (MD with patient, noted abnormal EKG, HR  in 160's earler. will check back after MD  has evaluated. check back at another time.)   [name] 03/23/2016, 12:35 PM Tresa Endo PT [phone]

## 2016-03-23 NOTE — Consult Note (Addendum)
[facility] for Infectious Disease    Date of Admission:  03/22/2016           Day 1 vancomycin        Day 1 cefepime        Day  acyclovir       Reason for Consult: HCAP and possible persistent  Herpes zoster    Referring Physician: Dr. Florencia Reasons Primary Care Physician: Dr. Lilli Light  Principal Problem:   HCAP (healthcare-associated pneumonia) Active Problems:   Breast cancer of upper-outer quadrant of right female breast ([place])   Herpes zoster   Protein-calorie malnutrition ([place])   Postherpetic neuralgia   Essential hypertension   Atrial fibrillation ([place])   Hypokalemia   Acute encephalopathy   Dehydration   . acyclovir  10 mg/kg (Adjusted) Intravenous Q8H  . atenolol  100 mg Oral Daily  . atorvastatin  80 mg Oral Daily  . ceFEPime (MAXIPIME) IV  1 g Intravenous Q8H  . diazepam  2 mg Oral QHS  . diltiazem  120 mg Oral Daily  . doxepin  50 mg Oral QHS  . ferrous sulfate  325 mg Oral Q breakfast  . gabapentin  100 mg Oral TID  . guaiFENesin  600 mg Oral BID  . montelukast  10 mg Oral Daily  . [START ON 03/24/2016] morphine  15 mg Oral Daily  . rivaroxaban  20 mg Oral Q supper    Recommendations: 1. Continue vancomycin and cefepime 2. Change IV acyclovir to oral valacyclovir 3. Continue isolation  Assessment: Ms. Andazola has been progressively debilitated by her recent chemotherapy, no oral virus infection and shingles. She has had intermittent fever, progressive weakness and some confusion over the past week. I suspect that this is due to healthcare associated pneumonia. I agree with empiric vancomycin and cefepime for now. I am not sure that her shingles is still active.  She has had trouble healing or lesions because of malnutrition and pressure on her back. I think it is reasonable to give her another course of therapy with oral valacyclovir just in case some of the lesions are active. I will follow-up with her in the morning.    HPI: TIFFANIE WENTLING is a 60 y.o. female was diagnosed with right breast cancer this spring. She started on chemotherapy with Adriamycin and Cytoxan in March.  She was hospitalized in early April for 3 days with nor over virus infection. She was rehospitalized on 02/21/2016 with fever and left thoracic Herpes zoster. She was treated with IV acyclovir then transition to oral valacyclovir on discharge. Following that admission her chemotherapy was stopped because she was getting increasingly debilitated. She has lost 30 pounds unintentionally. Over the past week she has developed intermittent fevers, new cough productive of white sputum,, intermittent nausea, vomiting and diarrhea. Yesterday she became confused and her family brought her to the hospital for readmission. Chest x-ray revealed right perihilar and upper lobe infiltrates. Head CT scan was unremarkable. Urinalysis was unremarkable. She was started on empiric antibiotic therapy. There was also concern for worsening of her Herpes zoster lesions. She has had persistent pain around the lesions on her left breast.  She feels like the lesions may be a little worse recently. He has been so globally weak recently and she has spent most of her time in bed. She is feeling better today. She has not had any more documented fever, nausea, vomiting or diarrhea. She was able  to eat her lunch.   Review of Systems: Review of Systems  Constitutional: Positive for fever, weight loss and malaise/fatigue. Negative for chills and diaphoresis.  HENT: Negative for sore throat.   Respiratory: Positive for cough and sputum production. Negative for hemoptysis, shortness of breath and wheezing.   Cardiovascular: Positive for chest pain.  Gastrointestinal: Positive for nausea, vomiting and diarrhea. Negative for heartburn, abdominal pain and constipation.  Genitourinary: Negative for dysuria.  Musculoskeletal: Positive for falls. Negative for myalgias and joint pain.  Skin: Positive for  rash. Negative for itching.  Neurological: Positive for sensory change and weakness. Negative for focal weakness and headaches.    Past Medical History  Diagnosis Date  . MRSA (methicillin resistant staph aureus) culture positive 2009     none since per patient  . Eczema   . Hypercholesteremia   . Hypertension   . Kidney stones   . Anemia   . Vitamin D deficiency   . Paroxysmal atrial fibrillation ([place]) 10/2011, 06/2015  . GERD (gastroesophageal reflux disease)   . Atrial fibrillation ([place]) 2016  . Dysrhythmia     A FIB - followed by Dr. Meda Coffee  . Breast cancer of upper-outer quadrant of right female breast ([place]) 12/12/2015  . Breast cancer ([place])   . Complication of anesthesia 10-06-15     slow to awaken after   . PONV (postoperative nausea and vomiting) 10-06-15    admitted back to hospital for dehydration  . Family history of breast cancer   . Family history of prostate cancer     Social History  Substance Use Topics  . Smoking status: Never Smoker   . Smokeless tobacco: Never Used  . Alcohol Use: No    Family History  Problem Relation Age of Onset  . Hypertension Mother   . Hepatitis C Mother   . Heart disease Father   . Dementia Father   . Aneurysm Brother   . Breast cancer Paternal Aunt     dx in her 59s  . Prostate cancer Paternal Uncle     60s  . Prostate cancer Paternal Uncle     dx in his 62s  . Leukemia Paternal Aunt   . Breast cancer Cousin     mother's paternal first cousin  . Thyroid cancer Cousin     mother's paternal first cousin   Allergies  Allergen Reactions  . Penicillins Other (See Comments)    Has patient had a PCN reaction causing immediate rash, facial/tongue/throat swelling, SOB or lightheadedness with hypotension: NO Has patient had a PCN reaction causing severe rash involving mucus membranes or skin necrosis: NO Has patient had a PCN reaction that required hospitalization NO Has patient had a PCN reaction occurring within the last  10 years: NO If all of the above answers are "NO", then may proceed with Cephalosporin use.    OBJECTIVE: Blood pressure 119/71, pulse 96, temperature 98.8 F (37.1 C), temperature source Axillary, resp. rate 18, height 5\' 2"  (1.575 m), weight 166 lb 14.4 oz (75.705 kg), SpO2 100 %.  Physical Exam  Constitutional: She is oriented to person, place, and time.  She is weak but alert and in no distress. No family are present.  HENT:  Mouth/Throat: No oropharyngeal exudate.  Eyes: Conjunctivae are normal.  Cardiovascular: Normal rate and regular rhythm.   No murmur heard. Pulmonary/Chest: Effort normal and breath sounds normal. She has no wheezes. She has no rales.  Abdominal: Soft. There is no tenderness.  Musculoskeletal: Normal range  of motion. She exhibits no edema or tenderness.  Neurological: She is alert and oriented to person, place, and time.  Skin:  She has scattered lesions on her left breast, flank and left upper back. Some are healing with hypopigmented scars.Some have a she will cross and there are still some very small ulcers. There is one large central ulcer on her upper back that has the appearance of a pressure sore. There is no drainage, cellulitis or odor.  Psychiatric: Mood and affect normal.    Lab Results Lab Results  Component Value Date   WBC 2.8* 03/23/2016   HGB 8.0* 03/23/2016   HCT 26.6* 03/23/2016   MCV 92.7 03/23/2016   PLT 297 03/23/2016    Lab Results  Component Value Date   CREATININE 0.69 03/23/2016   BUN 7 03/23/2016   NA 137 03/23/2016   K 3.0* 03/23/2016   CL 105 03/23/2016   CO2 24 03/23/2016    Lab Results  Component Value Date   ALT 14 03/23/2016   AST 24 03/23/2016   ALKPHOS 66 03/23/2016   BILITOT 0.7 03/23/2016     Microbiology: Recent Results (from the past 240 hour(s))  MRSA PCR Screening     Status: None   Collection Time: 03/23/16 11:17 AM  Result Value Ref Range Status   MRSA by PCR NEGATIVE NEGATIVE Final     Comment:        The GeneXpert MRSA Assay (FDA approved for NASAL specimens only), is one component of a comprehensive MRSA colonization surveillance program. It is not intended to diagnose MRSA infection nor to guide or monitor treatment for MRSA infections.     Michel Bickers, MD [facility] [phone] pager   [phone] cell 03/23/2016, 4:36 PM

## 2016-03-23 NOTE — Progress Notes (Signed)
Pharmacy Antibiotic Note  [name] is a 60 y.o. female admitted on 03/22/2016 at [facility] with AMS, weakness, N/V, and pneumonia.  She was transferred to [facility].  PMH significant for breast cancer, and recent sepsis secondary to cellulitis and shingles (02/2016), and afib on Xarelto.   Pharmacy has been consulted for Vancomycin and Cefepime dosing for pneumonia, and now adding Acyclovir for Herpes Zoster.  Plan:  Acyclovir 600 mg IV q8h (10 mg/kg, but for BMI > 30, using adjusted weight of 60 kg)  Continue Cefepime 1g IV q8h  Continue Vancomycin 1g IV q12h.  Measure Vanc trough at steady state.  Follow up renal fxn, culture results, and clinical course.  Weight: 166 lb 14.4 oz (75.705 kg)  Ideal body weight: 50.1 kg (110 lb 7.2 oz) Adjusted ideal body weight: 60.343 kg (133 lb 0.5 oz)  BMI 30.519   Temp (24hrs), Avg:98 F (36.7 C), Min:97.2 F (36.2 C), Max:98.5 F (36.9 C)   Recent Labs Lab 03/22/16 1535 03/22/16 1900 03/23/16 0033 03/23/16 0042 03/23/16 0619  WBC 3.6* 3.9* 4.0  --  2.8*  CREATININE 0.60 0.74  --   --  0.69  LATICACIDVEN  --   --   --  1.27  --     Estimated Creatinine Clearance: 70.3 mL/min (by C-G formula based on Cr of 0.69).    Allergies  Allergen Reactions  . Penicillins Other (See Comments)    Has patient had a PCN reaction causing immediate rash, facial/tongue/throat swelling, SOB or lightheadedness with hypotension: NO Has patient had a PCN reaction causing severe rash involving mucus membranes or skin necrosis: NO Has patient had a PCN reaction that required hospitalization NO Has patient had a PCN reaction occurring within the last 10 years: NO If all of the above answers are "NO", then may proceed with Cephalosporin use.    Antimicrobials this admission: 6/5 Vanc >>  6/5 Cefepime >>  6/6 Acyclovir >>   Dose adjustments this admission:  Microbiology results: 6/6 BCx: sent 6/5 UCx: sent   Thank you for allowing pharmacy to be  a part of this patient's care.  Gretta Arab PharmD, BCPS Pager [phone] 03/23/2016 9:34 AM

## 2016-03-23 NOTE — Consult Note (Signed)
WOC wound consult note Reason for Consult: herpes zoster to left back and breast dermatone, also intertriginous dermatitis in the intra gluteal cleft Wound type:Viral (infectious), moisture Pressure Ulcer POA: No Measurement:ITD measures 6cm x 0.4cm x 0.2cm red, moist wound bed, scant serous exudate, no odor.  Zoster: 8cm x 11cm x 0.2cm at thoracic, scattered open areas on left breast. Pink, moist with serous exudate Wound bed:As described above Drainage (amount, consistency, odor) As described above Periwound:intact, dry Dressing procedure/placement/frequency: I will provide Nursing with guidance for the care of the zoster lesions to reduce pain and protect the lesions as they heal.  The intertriginous dermatitis (ITD) will be treated with our house moisture-wicking textile with antimicrobial properties.  I have provided a mattress replacement with los air loss feature for comfort and for management of microclimate. [place] nursing team will not follow, but will remain available to this patient, the nursing and medical teams.  Please re-consult if needed. Thanks, Maudie Flakes, MSN, RN, [place], Arther Abbott  Pager# [phone]

## 2016-03-23 NOTE — Progress Notes (Signed)
PROGRESS NOTE  [name] [id number] DOB: 31-Jan-1956 DOA: 03/22/2016 PCP: Alesia Richards, MD  HPI/Recap of past 24 hours:  Patient reports feeling better, confusion has resolved, no vomiting, no diarrhea since being admitted, no fever, family at bedside  Assessment/Plan: Active Problems:   Essential hypertension   Breast cancer of upper-outer quadrant of right female breast ([place])   Atrial fibrillation ([place])   Hypokalemia   Acute encephalopathy   Dehydration   HCAP (healthcare-associated pneumonia)   Bronchopneumonia  HCAP on room air, no respiratory distress cxr with "Right perihilar and right upper lobe peribronchial thickening and increased interstitial markings could reflect early bronchopneumonia or radiation changes." Blood culture in process, sputum culture pending collection she is started on vanc/cefepime on admission, vancd/ced on 6/6 due to mrsa screening negative and clinical improvement.  Shingles:  with extensive skin breakdown and open wound left chest wall, family reported increased drainage, she states finished valtrex treatment at home, currently denies pain, iv acyclovir restarted , patient is on contact precaution and airborn precaution, infectious disease consulted. Wound care consulted.  Metabolic encephalopathy: from above, better   intertriginous dermatitis in the intra gluteal cleft presented on admission: wound care consulted  Chronic cancer pain, family requested to taper opioids meds, reported patient does not have much pain, but does have drowsiness from pain meds. She is currently on ms contin 15mg  bid, will changed to qd, continue prn percocet. Consider stop ms contin in the next 1-2 day, if pain well controlled.  Invasive ductal carcinoma of right breast:  Plan for neoadjuvant chemotherapy followed by breast conservation surgery and adjuvant radiation treatment.   planned neoadjuvant chemo stopped due to two hospitalizations, now  third hospitalization Outpatient oncology follow up  Paroxysmal Afib: sinus rhythm in the hospital,  Continue home meds atenolol, cardizem, xarelto  Hypokalemia: replace k, check mag  Anemia: hgb 8 close to baseline, no active blood loss.  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     Code Status: full  Family Communication: patient and family in room  Disposition Plan:  family want home with home health, they donot want snf.   Consultants:  Infectious disease  Wound care  Procedures:  none  Antibiotics:  Vanc/cefepime  acyclovir   Objective: BP 100/70 mmHg  Pulse 90  Temp(Src) 98.5 F (36.9 C) (Oral)  Resp 18  Wt 75.705 kg (166 lb 14.4 oz)  SpO2 99% No intake or output data in the 24 hours ending 03/23/16 0931 Filed Weights   03/23/16 0906  Weight: 75.705 kg (166 lb 14.4 oz)    Exam:   General:  Frail, weak, need two person assist to sit up, aaox3,   Cardiovascular: RRR  Respiratory: diminished, some crackles, no wheezing, no rhonchi,   abbdomen: Soft/ND/NT, positive BS  Musculoskeletal: No Edema  Neuro: aaox3  Skin: large open areas along left breast, left axilla, left back in deraotomal distribution., intertriginous dermatitis in the intra gluteal cleft  Data Reviewed: Basic Metabolic Panel:  Recent Labs Lab 03/22/16 1535 03/22/16 1900 03/23/16 0033 03/23/16 0619  NA 138 136  --  137  K 3.7 3.3*  --  3.0*  CL 98 99*  --  105  CO2 28 25  --  24  GLUCOSE 95 119*  --  90  BUN 6* 5*  --  7  CREATININE 0.60 0.74  --  0.69  CALCIUM 9.6 10.0  --  8.8*  MG  --   --  1.7  --  Liver Function Tests:  Recent Labs Lab 03/22/16 1535 03/22/16 1900 03/23/16 0619  AST 30 35 24  ALT 11 17 14   ALKPHOS 88 89 66  BILITOT 0.5 0.6 0.7  PROT 7.0 7.4 5.5*  ALBUMIN 3.4* 3.1* 2.3*   No results for input(s): LIPASE, AMYLASE in the last 168 hours. No results for input(s): AMMONIA in the last 168 hours. CBC:  Recent Labs Lab 03/22/16 1535  03/22/16 1900 03/23/16 0033 03/23/16 0619  WBC 3.6* 3.9* 4.0 2.8*  NEUTROABS 1476*  --  1.6* 1.1*  HGB 10.7* 10.7* 9.5* 8.0*  HCT 34.9* 35.2* 31.1* 26.6*  MCV 91.4 92.1 93.1 92.7  PLT 398 366 365 297   Cardiac Enzymes:   No results for input(s): CKTOTAL, CKMB, CKMBINDEX, TROPONINI in the last 168 hours. BNP (last 3 results) No results for input(s): BNP in the last 8760 hours.  ProBNP (last 3 results) No results for input(s): PROBNP in the last 8760 hours.  CBG: No results for input(s): GLUCAP in the last 168 hours.  No results found for this or any previous visit (from the past 240 hour(s)).   Studies: Dg Chest 2 View  03/22/2016  CLINICAL DATA:  [place] tonight.  Weakness. EXAM: CHEST  2 VIEW COMPARISON:  Chest x-ray 02/23/2016 and chest CT 01/30/2016 FINDINGS: The Port-A-Cath is stable. Right perihilar and right upper lobe peribronchial thickening could reflect bronchitis or early bronchopneumonia. Radiation changes are also possible. The left lung is relatively clear. No pleural effusions. Stable mild right-sided pleural thickening. The bony thorax is intact. IMPRESSION: Right perihilar and right upper lobe peribronchial thickening and increased interstitial markings could reflect early bronchopneumonia or radiation changes. Electronically Signed   By: Marijo Sanes M.D.   On: 03/22/2016 20:35   Ct Head Wo Contrast  03/22/2016  CLINICAL DATA:  [place] 1 week ago and hit head. Generalized weakness. EXAM: CT HEAD WITHOUT CONTRAST TECHNIQUE: Contiguous axial images were obtained from the base of the skull through the vertex without intravenous contrast. COMPARISON:  07/27/2008 FINDINGS: The ventricles are normal in size and configuration. No extra-axial fluid collections are identified. The gray-white differentiation is normal. No CT findings for acute intracranial process such as hemorrhage or infarction. No mass lesions. The brainstem and cerebellum are grossly normal. The bony structures are  intact. Hyperostosis frontalis interna noted. The paranasal sinuses and mastoid air cells are clear. The globes are intact. IMPRESSION: No acute intracranial findings or mass lesion. Electronically Signed   By: Marijo Sanes M.D.   On: 03/22/2016 20:52    Scheduled Meds: . atenolol  100 mg Oral Daily  . atorvastatin  80 mg Oral Daily  . ceFEPime (MAXIPIME) IV  1 g Intravenous Q8H  . diazepam  2 mg Oral QHS  . diltiazem  120 mg Oral Daily  . doxepin  50 mg Oral QHS  . ferrous sulfate  325 mg Oral Q breakfast  . gabapentin  100 mg Oral TID  . guaiFENesin  600 mg Oral BID  . montelukast  10 mg Oral Daily  . morphine  15 mg Oral Q12H  . rivaroxaban  20 mg Oral Q supper  . vancomycin  1,000 mg Intravenous Q12H    Continuous Infusions: . sodium chloride 125 mL/hr at 03/23/16 [id number]     Time spent: 68mins  Samia Kukla MD, PhD  [facility] Pager [phone]. If 7PM-7AM, please contact night-coverage at [email or web address], password [facility] 03/23/2016, 9:31 AM  LOS: 0 days

## 2016-03-24 ENCOUNTER — Inpatient Hospital Stay (HOSPITAL_COMMUNITY): Payer: BC Managed Care – PPO

## 2016-03-24 DIAGNOSIS — R627 Adult failure to thrive: Secondary | ICD-10-CM | POA: Insufficient documentation

## 2016-03-24 DIAGNOSIS — C50411 Malignant neoplasm of upper-outer quadrant of right female breast: Secondary | ICD-10-CM

## 2016-03-24 DIAGNOSIS — R5381 Other malaise: Secondary | ICD-10-CM | POA: Insufficient documentation

## 2016-03-24 LAB — ABO/RH: ABO/RH(D): O POS

## 2016-03-24 LAB — BASIC METABOLIC PANEL
ANION GAP: 4 — AB (ref 5–15)
BUN: 7 mg/dL (ref 6–20)
CHLORIDE: 109 mmol/L (ref 101–111)
CO2: 23 mmol/L (ref 22–32)
Calcium: 8.3 mg/dL — ABNORMAL LOW (ref 8.9–10.3)
Creatinine, Ser: 0.66 mg/dL (ref 0.44–1.00)
GFR calc Af Amer: >60 mL/min (ref >60)
GFR calc non Af Amer: >60 mL/min (ref >60)
GLUCOSE: 70 mg/dL (ref 65–99)
POTASSIUM: 3.7 mmol/L (ref 3.5–5.1)
Sodium: 136 mmol/L (ref 135–145)

## 2016-03-24 LAB — CBC
HEMATOCRIT: 24 % — AB (ref 36.0–46.0)
HEMOGLOBIN: 7.5 g/dL — AB (ref 12.0–15.0)
MCH: 29.3 pg (ref 26.0–34.0)
MCHC: 31.3 g/dL (ref 30.0–36.0)
MCV: 93.8 fL (ref 78.0–100.0)
PLATELETS: 298 10*3/uL (ref 150–400)
RBC: 2.56 MIL/uL — AB (ref 3.87–5.11)
RDW: 20.1 % — ABNORMAL HIGH (ref 11.5–15.5)
WBC: 2.3 10*3/uL — ABNORMAL LOW (ref 4.0–10.5)

## 2016-03-24 LAB — STREP PNEUMONIAE URINARY ANTIGEN: STREP PNEUMO URINARY ANTIGEN: NEGATIVE

## 2016-03-24 LAB — URINE CULTURE: Culture: NO GROWTH

## 2016-03-24 LAB — PREPARE RBC (CROSSMATCH)

## 2016-03-24 MED ORDER — SODIUM CHLORIDE 0.9 % IV SOLN: Freq: Once | INTRAVENOUS | Status: DC

## 2016-03-24 NOTE — Evaluation (Signed)
Occupational Therapy Evaluation Patient Details Name: [name] MRN: [id number] DOB: 28-Apr-1956 Today's Date: 03/24/2016    History of Present Illness 60 y.o. female with history of breast cancer, afib (on Xarelto), HTN, HLD, who presented to the ED for evaluation of altered mental status, generalized weakness, and nausea/vomiting.  Also found to have shingles.   Clinical Impression   Pt was admitted for the above. She will benefit from continued OT in acute setting.  Pt currently needs min A for toilet transfers and min to mod A for ADLs. Goals in acute are for min guard to min A.    Follow Up Recommendations  Supervision/Assistance - 24 hour    Equipment Recommendations  3 in 1 bedside comode    Recommendations for Other Services       Precautions / Restrictions Precautions Precautions: Fall Restrictions Weight Bearing Restrictions: No      Mobility Bed Mobility Overal bed mobility: Needs Assistance Bed Mobility: Supine to Sit     Supine to sit: Min assist     General bed mobility comments: Min A for trunk and min A to get legs back in bed  Transfers Overall transfer level: Needs assistance   Transfers: Sit to/from Stand Sit to Stand: Min guard Stand pivot transfers: Min guard       General transfer comment: close guarding for standing.  Min A for ambulation with hand held assist    Balance                                            ADL Overall ADL's : Needs assistance/impaired     Grooming: Wash/dry hands;Wash/dry face;Min guard;Standing   Upper Body Bathing: Set up;Min guard;Standing   Lower Body Bathing: Minimal assistance;Sit to/from stand   Upper Body Dressing : Minimal assistance;Sitting (lines)   Lower Body Dressing: Maximal assistance;Sit to/from stand   Toilet Transfer: Minimal assistance;Ambulation (hand held assist)   Toileting- Water quality scientist and Hygiene: Min guard;Sit to/from stand          General ADL Comments: ambulated to bathroom, used toilet and performed ADL standing at sink.  Offered 3:1 to sit on, but pt preferred to stand.  Educated on energy conservation and rest breaks.     Vision     Perception     Praxis      Pertinent Vitals/Pain Pain Assessment: 0-10 Pain Score: 4  Pain Location: shingles, ches Pain Descriptors / Indicators: Sore Pain Intervention(s): Limited activity within patient's tolerance;Monitored during session;Premedicated before session;Repositioned     Hand Dominance     Extremity/Trunk Assessment Upper Extremity Assessment Upper Extremity Assessment:  (deferred due to chest soreness. able to lift LUE to 90 during adls )          Communication Communication Communication: No difficulties   Cognition Arousal/Alertness: Awake/alert Behavior During Therapy: WFL for tasks assessed/performed Overall Cognitive Status: Within Functional Limits for tasks assessed                     General Comments       Exercises       Shoulder Instructions      Home Living Family/patient expects to be discharged to:: Private residence   Available Help at Discharge: Family Type of Home: House Home Access: Stairs to enter [employer] of Steps: 1   Home Layout: One level  Bathroom Toilet: Handicapped height     Home Equipment: None   Additional Comments: pt has been sponge bathing with skin issues.  Plans to stay with mom and sister      Prior Functioning/Environment Level of Independence: Independent             OT Diagnosis: Generalized weakness;Acute pain   OT Problem List: Decreased strength;Decreased activity tolerance;Impaired balance (sitting and/or standing);Decreased knowledge of use of DME or AE;Pain   OT Treatment/Interventions: Self-care/ADL training;DME and/or AE instruction;Energy conservation;Therapeutic activities;Patient/family education;Balance training    OT Goals(Current goals  can be found in the care plan section) Acute Rehab OT Goals Patient Stated Goal: home and get strength back OT Goal Formulation: With patient Time For Goal Achievement: 03/31/16 Potential to Achieve Goals: Good ADL Goals Pt Will Perform Grooming: with supervision;standing (2 tasks) Pt Will Perform Lower Body Bathing: sit to/from stand;with min guard assist Pt Will Transfer to Toilet: with min guard assist;ambulating;bedside commode Pt Will Perform Toileting - Clothing Manipulation and hygiene: with supervision;sit to/from stand Additional ADL Goal #1: pt will initiate at least one rest break for energy conservation  OT Frequency: Min 2X/week   Barriers to D/C:            Co-evaluation              End of Session    Activity Tolerance: Patient tolerated treatment well Patient left: in bed;with call bell/phone within reach;with bed alarm set (4 rails at pt's request)   Time: [id number] OT Time Calculation (min): 31 min Charges:  OT General Charges $OT Visit: 1 Procedure OT Evaluation $OT Eval Moderate Complexity: 1 Procedure OT Treatments $Self Care/Home Management : 8-22 mins G-Codes:    Imunique Samad 15-Apr-2016, 3:48 PM Lesle Chris, OTR/L [phone] 04/15/2016

## 2016-03-24 NOTE — Progress Notes (Signed)
Patient ID: [name], female   DOB: [birth year], 60 y.o.   MRN: [id number]         [facility] for Infectious Disease    Date of Admission:  03/22/2016           Day 2 vancomycin        Day 2 cefepime        Day 2 therapy for possible persistent shingles  Principal Problem:   HCAP (healthcare-associated pneumonia) Active Problems:   Breast cancer of upper-outer quadrant of right female breast ([place])   Herpes zoster   Protein-calorie malnutrition ([facility])   Postherpetic neuralgia   Essential hypertension   Atrial fibrillation ([facility])   Hypokalemia   Acute encephalopathy   Dehydration   . atenolol  100 mg Oral Daily  . atorvastatin  80 mg Oral Daily  . ceFEPime (MAXIPIME) IV  1 g Intravenous Q8H  . diazepam  2 mg Oral QHS  . diltiazem  120 mg Oral Daily  . doxepin  50 mg Oral QHS  . ferrous sulfate  325 mg Oral Q breakfast  . gabapentin  100 mg Oral TID  . guaiFENesin  600 mg Oral BID  . montelukast  10 mg Oral Daily  . morphine  15 mg Oral Daily  . rivaroxaban  20 mg Oral Q supper  . valACYclovir  1,000 mg Oral TID    SUBJECTIVE: She is complaining of sore mouth after eating some fish yesterday. She states that this happens whenever she eats fish or pineapple. She is also complaining of severe, persistent left breast pain. She denies any more cough, sputum production or shortness of breath. She had been living with her daughter before admission but states that she plans to go home with her mother and sister after discharge.  Review of Systems: Review of Systems  Constitutional: Positive for weight loss and malaise/fatigue. Negative for fever, chills and diaphoresis.  HENT: Negative for sore throat.        Sore mouth.  Respiratory: Negative for cough, sputum production and shortness of breath.   Cardiovascular: Positive for chest pain.       She has persistent postherpetic neuralgia pain in her left breast.  Gastrointestinal: Negative for heartburn, nausea,  vomiting, abdominal pain, diarrhea and constipation.  Genitourinary: Negative for dysuria.  Musculoskeletal: Negative for joint pain.  Skin: Positive for rash. Negative for itching.  Neurological: Negative for headaches.    Past Medical History  Diagnosis Date  . MRSA (methicillin resistant staph aureus) culture positive 2009     none since per patient  . Eczema   . Hypercholesteremia   . Hypertension   . Kidney stones   . Anemia   . Vitamin D deficiency   . Paroxysmal atrial fibrillation ([place]) 10/2011, 06/2015  . GERD (gastroesophageal reflux disease)   . Atrial fibrillation ([place]) 2016  . Dysrhythmia     A FIB - followed by Dr. Meda Coffee  . Breast cancer of upper-outer quadrant of right female breast ([place]) 12/12/2015  . Breast cancer ([place])   . Complication of anesthesia 10-06-15     slow to awaken after   . PONV (postoperative nausea and vomiting) 10-06-15    admitted back to hospital for dehydration  . Family history of breast cancer   . Family history of prostate cancer     Social History  Substance Use Topics  . Smoking status: Never Smoker   . Smokeless tobacco: Never Used  . Alcohol Use: No  Family History  Problem Relation Age of Onset  . Hypertension Mother   . Hepatitis C Mother   . Heart disease Father   . Dementia Father   . Aneurysm Brother   . Breast cancer Paternal Aunt     dx in her 5s  . Prostate cancer Paternal Uncle     48s  . Prostate cancer Paternal Uncle     dx in his 26s  . Leukemia Paternal Aunt   . Breast cancer Cousin     mother's paternal first cousin  . Thyroid cancer Cousin     mother's paternal first cousin   Allergies  Allergen Reactions  . Penicillins Other (See Comments)    Has patient had a PCN reaction causing immediate rash, facial/tongue/throat swelling, SOB or lightheadedness with hypotension: NO Has patient had a PCN reaction causing severe rash involving mucus membranes or skin necrosis: NO Has patient had a PCN  reaction that required hospitalization NO Has patient had a PCN reaction occurring within the last 10 years: NO If all of the above answers are "NO", then may proceed with Cephalosporin use.    OBJECTIVE: Filed Vitals:   03/23/16 1447 03/23/16 2123 03/24/16 0532 03/24/16 0924  BP: 119/71 119/62 93/78 113/62  Pulse: 96 96 73 93  Temp:  98.8 F (37.1 C) 98.3 F (36.8 C)   TempSrc:  Oral Oral   Resp:  16 16   Height:      Weight:      SpO2: 100% 100% 100%    Body mass index is 30.52 kg/(m^2).  Physical Exam  Constitutional: She is oriented to person, place, and time.  She is uncomfortable laying quietly in bed. She is tearful because of pain.  HENT:  Mouth/Throat: No oropharyngeal exudate.  Mucous membranes are red but I cannot see any distinct ulcers or other acute lesions.  Cardiovascular: Normal rate and regular rhythm.   No murmur heard. Pulmonary/Chest: Effort normal and breath sounds normal. She has no wheezes. She has no rales.  Abdominal: Soft. There is no tenderness.  Neurological: She is alert and oriented to person, place, and time.  Skin: Rash noted.  No change in rash on her left breast, flank and back.  Psychiatric: Affect normal.    Lab Results Lab Results  Component Value Date   WBC 2.3* 03/24/2016   HGB 7.5* 03/24/2016   HCT 24.0* 03/24/2016   MCV 93.8 03/24/2016   PLT 298 03/24/2016    Lab Results  Component Value Date   CREATININE 0.66 03/24/2016   BUN 7 03/24/2016   NA 136 03/24/2016   K 3.7 03/24/2016   CL 109 03/24/2016   CO2 23 03/24/2016    Lab Results  Component Value Date   ALT 14 03/23/2016   AST 24 03/23/2016   ALKPHOS 66 03/23/2016   BILITOT 0.7 03/23/2016     Microbiology: Recent Results (from the past 240 hour(s))  Urine culture     Status: None   Collection Time: 03/22/16  3:29 PM  Result Value Ref Range Status   Colony Count 30,000 COLONIES/ML  Final   Organism ID, Bacteria Multiple bacterial morphotypes present, none   Final   Organism ID, Bacteria predominant. Suggest appropriate recollection if   Final   Organism ID, Bacteria clinically indicated.  Final  Urine culture     Status: None   Collection Time: 03/22/16  9:53 PM  Result Value Ref Range Status   Specimen Description URINE, CATHETERIZED  Final  Special Requests NONE  Final   Culture NO GROWTH  Final   Report Status 03/24/2016 FINAL  Final  MRSA PCR Screening     Status: None   Collection Time: 03/23/16 11:17 AM  Result Value Ref Range Status   MRSA by PCR NEGATIVE NEGATIVE Final    Comment:        The GeneXpert MRSA Assay (FDA approved for NASAL specimens only), is one component of a comprehensive MRSA colonization surveillance program. It is not intended to diagnose MRSA infection nor to guide or monitor treatment for MRSA infections.      ASSESSMENT: She has not had any documented fever since admission and has no current symptoms to suggest active pneumonia. I will repeat a chest x-ray today to see if what appeared to be infiltrates may have been atelectasis that is resolving quickly. I will continue current antimicrobial therapy for now. I think the major issues are control of her postherpetic neuralgia pain and generalized failure to thrive.  PLAN: 1. Repeat chest x-ray 2. Continue current antimicrobial therapy 3. Consider increasing gabapentin  Michel Bickers, MD [facility] for [place] [phone] pager   [phone] cell 03/24/2016, 11:34 AM

## 2016-03-24 NOTE — Progress Notes (Signed)
PROGRESS NOTE  [name] [id number] DOB: 1956-05-02 DOA: 03/22/2016 PCP: Alesia Richards, MD    Assessment/Plan: Principal Problem:   HCAP (healthcare-associated pneumonia) Active Problems:   Essential hypertension   Breast cancer of upper-outer quadrant of right female breast ([place])   Atrial fibrillation ([place])   Herpes zoster   Hypokalemia   Acute encephalopathy   Dehydration   Protein-calorie malnutrition (HCC)   Postherpetic neuralgia  HCAP on room air, no respiratory distress cxr with "Right perihilar and right upper lobe peribronchial thickening and increased interstitial markings could reflect early bronchopneumonia or radiation changes." Blood culture in process, sputum culture pending collection she is started on vanc/cefepime on admission, vancd/ced on 6/6 due to mrsa screening negative and clinical improvement. -On 03/24/2016 she remains on cefepime 1 g IV every 8 hours. -Plan to transition to oral antimicrobial therapy in the next 24 hours. Overall seems to be showing clinical improvement.  Shingles:  with extensive skin breakdown and open wound left chest wall, family reported increased drainage, she states finished valtrex treatment at home, currently denies pain, iv acyclovir restarted , patient is on contact precaution and airborn precaution, infectious disease consulted. Wound care consulted. -Case was discussed with infectious disease, she was started on Valtrex 1,000 mg by mouth 3 times a day  Invasive ductal carcinoma of right breast:  Plan for neoadjuvant chemotherapy followed by breast conservation surgery and adjuvant radiation treatment.   planned neoadjuvant chemo stopped due to two hospitalizations, now third hospitalization -Last dose of chemotherapy given on 02/11/2016.  Anemia:  -Hemoccult trended down to 7.5. -Suspect anemia related to malignancy/chronic disease. -Having presentation of generalized weakness, functional decline,  will transfuse with 1 unit of packed red blood cells today.  Deconditioning. -Physical therapy consulted, face-to-face for home health services completed on empiric  Code Status: full  Family Communication: patient and family in room  Disposition Plan:  Plan for discharge to home in the next 24-48 hours   Consultants:  Infectious disease  Wound care  Procedures:  none  Antibiotics:  Vanc/cefepime  acyclovir   Objective: BP 113/62 mmHg  Pulse 93  Temp(Src) 98.3 F (36.8 C) (Oral)  Resp 16  Ht 5\' 2"  (1.575 m)  Wt 75.705 kg (166 lb 14.4 oz)  BMI 30.52 kg/m2  SpO2 100%  Intake/Output Summary (Last 24 hours) at 03/24/16 1404 Last data filed at 03/24/16 1100  Gross per 24 hour  Intake   3790 ml  Output    400 ml  Net   3390 ml   Filed Weights   03/23/16 0906  Weight: 75.705 kg (166 lb 14.4 oz)    Exam:   General:  Frail, weak, He is ambulating around her room with assistance  Cardiovascular: RRR  Respiratory: diminished, some crackles, no wheezing, no rhonchi,   abbdomen: Soft/ND/NT, positive BS  Musculoskeletal: No Edema  Neuro: aaox3  Skin: large open areas along left breast, left axilla, left back in deraotomal distribution., intertriginous dermatitis in the intra gluteal cleft  Data Reviewed: Basic Metabolic Panel:  Recent Labs Lab 03/22/16 1535 03/22/16 1900 03/23/16 0033 03/23/16 0619 03/24/16 0500  NA 138 136  --  137 136  K 3.7 3.3*  --  3.0* 3.7  CL 98 99*  --  105 109  CO2 28 25  --  24 23  GLUCOSE 95 119*  --  90 70  BUN 6* 5*  --  7 7  CREATININE 0.60 0.74  --  0.69 0.66  CALCIUM  9.6 10.0  --  8.8* 8.3*  MG  --   --  1.7  --   --    Liver Function Tests:  Recent Labs Lab 03/22/16 1535 03/22/16 1900 03/23/16 0619  AST 30 35 24  ALT 11 17 14   ALKPHOS 88 89 66  BILITOT 0.5 0.6 0.7  PROT 7.0 7.4 5.5*  ALBUMIN 3.4* 3.1* 2.3*   No results for input(s): LIPASE, AMYLASE in the last 168 hours. No results for  input(s): AMMONIA in the last 168 hours. CBC:  Recent Labs Lab 03/22/16 1535 03/22/16 1900 03/23/16 0033 03/23/16 0619 03/24/16 0500  WBC 3.6* 3.9* 4.0 2.8* 2.3*  NEUTROABS 1476*  --  1.6* 1.1*  --   HGB 10.7* 10.7* 9.5* 8.0* 7.5*  HCT 34.9* 35.2* 31.1* 26.6* 24.0*  MCV 91.4 92.1 93.1 92.7 93.8  PLT 398 366 365 297 298   Cardiac Enzymes:   No results for input(s): CKTOTAL, CKMB, CKMBINDEX, TROPONINI in the last 168 hours. BNP (last 3 results) No results for input(s): BNP in the last 8760 hours.  ProBNP (last 3 results) No results for input(s): PROBNP in the last 8760 hours.  CBG: No results for input(s): GLUCAP in the last 168 hours.  Recent Results (from the past 240 hour(s))  Urine culture     Status: None   Collection Time: 03/22/16  3:29 PM  Result Value Ref Range Status   Colony Count 30,000 COLONIES/ML  Final   Organism ID, Bacteria Multiple bacterial morphotypes present, none  Final   Organism ID, Bacteria predominant. Suggest appropriate recollection if   Final   Organism ID, Bacteria clinically indicated.  Final  Urine culture     Status: None   Collection Time: 03/22/16  9:53 PM  Result Value Ref Range Status   Specimen Description URINE, CATHETERIZED  Final   Special Requests NONE  Final   Culture NO GROWTH  Final   Report Status 03/24/2016 FINAL  Final  MRSA PCR Screening     Status: None   Collection Time: 03/23/16 11:17 AM  Result Value Ref Range Status   MRSA by PCR NEGATIVE NEGATIVE Final    Comment:        The GeneXpert MRSA Assay (FDA approved for NASAL specimens only), is one component of a comprehensive MRSA colonization surveillance program. It is not intended to diagnose MRSA infection nor to guide or monitor treatment for MRSA infections.      Studies: Dg Chest 1 View  03/24/2016  CLINICAL DATA:  Short of breath EXAM: CHEST 1 VIEW COMPARISON:  03/22/2016 FINDINGS: Pneumonia in the right mid lung zone is stable. Left subclavian  Port-A-Cath is stable. Normal heart size. Low lung volumes. No pneumothorax. IMPRESSION: Stable right mid lung pneumonia. Followup PA and lateral chest X-ray is recommended in 3-4 weeks following trial of antibiotic therapy to ensure resolution and exclude underlying malignancy. Electronically Signed   By: Marybelle Killings M.D.   On: 03/24/2016 13:50    Scheduled Meds: . sodium chloride   Intravenous Once  . atenolol  100 mg Oral Daily  . atorvastatin  80 mg Oral Daily  . ceFEPime (MAXIPIME) IV  1 g Intravenous Q8H  . diazepam  2 mg Oral QHS  . diltiazem  120 mg Oral Daily  . doxepin  50 mg Oral QHS  . ferrous sulfate  325 mg Oral Q breakfast  . gabapentin  100 mg Oral TID  . guaiFENesin  600 mg Oral BID  .  montelukast  10 mg Oral Daily  . morphine  15 mg Oral Daily  . rivaroxaban  20 mg Oral Q supper  . valACYclovir  1,000 mg Oral TID    Continuous Infusions: . sodium chloride 125 mL/hr at 03/24/16 1235     Time spent: 25 mins  Kelvin Cellar MD [facility] Pager [phone]. If 7PM-7AM, please contact night-coverage at [email or web address], password [facility] 03/24/2016, 2:04 PM  LOS: 1 day

## 2016-03-24 NOTE — Evaluation (Signed)
Physical Therapy Evaluation Patient Details Name: [name] MRN: [id number] DOB: Sep 23, 1956 Today's Date: 03/24/2016   History of Present Illness  60 y.o. female with history of breast cancer, afib (on Xarelto), HTN, HLD, who presented to the ED for evaluation of altered mental status, generalized weakness, and nausea/vomiting.  Also found to have shingles.  Clinical Impression  Pt admitted with above diagnosis. Pt currently with functional limitations due to the deficits listed below (see PT Problem List).  Pt assisted to [facility] (notified NT of stool sample) and then ambulated around room.  Pt reports overall feeling better since admission and plans to d/c home with assist from family. Pt will benefit from skilled PT to increase their independence and safety with mobility to allow discharge to the venue listed below.       Follow Up Recommendations Home health PT    Equipment Recommendations  Rolling walker with 5" wheels;3in1 (PT)    Recommendations for Other Services       Precautions / Restrictions Precautions Precautions: Fall      Mobility  Bed Mobility Overal bed mobility: Needs Assistance Bed Mobility: Supine to Sit     Supine to sit: Min assist     General bed mobility comments: assist for trunk upright  Transfers Overall transfer level: Needs assistance   Transfers: Sit to/from Stand;Stand Pivot Transfers Sit to Stand: Min guard Stand pivot transfers: Min guard       General transfer comment: min/guard for safety, transferred to [facility], able to perform standing pericare  Ambulation/Gait Ambulation/Gait assistance: Min guard Ambulation Distance (Feet): 35 Feet Assistive device: None Gait Pattern/deviations: Step-through pattern     General Gait Details: pt pushed IV pole, slow but steady gait, remained in room due to airborne precautions  Stairs            Wheelchair Mobility    Modified Rankin (Stroke Patients Only)       Balance                                              Pertinent Vitals/Pain Pain Assessment: 0-10 Pain Score: 4  Pain Location: shingles distribution Pain Descriptors / Indicators: Sore Pain Intervention(s): Limited activity within patient's tolerance;Monitored during session;Repositioned    Home Living Family/patient expects to be discharged to:: Private residence Living Arrangements: Children (daughter)   Type of Home: House Home Access: Stairs to enter   Technical brewer of Steps: 1 Home Layout: One level Home Equipment: None      Prior Function Level of Independence: Independent               Hand Dominance        Extremity/Trunk Assessment               Lower Extremity Assessment: Generalized weakness         Communication   Communication: No difficulties  Cognition Arousal/Alertness: Awake/alert Behavior During Therapy: WFL for tasks assessed/performed Overall Cognitive Status: Within Functional Limits for tasks assessed                      General Comments      Exercises        Assessment/Plan    PT Assessment Patient needs continued PT services  PT Diagnosis Difficulty walking   PT Problem List Decreased strength;Decreased mobility;Decreased activity tolerance;Decreased knowledge of  use of DME;Pain  PT Treatment Interventions DME instruction;Gait training;Functional mobility training;Patient/family education;Therapeutic activities;Therapeutic exercise   PT Goals (Current goals can be found in the Care Plan section) Acute Rehab PT Goals Patient Stated Goal: home PT Goal Formulation: With patient Time For Goal Achievement: 03/31/16 Potential to Achieve Goals: Good    Frequency Min 3X/week   Barriers to discharge        Co-evaluation               End of Session   Activity Tolerance: Patient tolerated treatment well Patient left: in chair;with chair alarm set;with call bell/phone within reach Nurse  Communication: Mobility status         Time: 1040-1103 PT Time Calculation (min) (ACUTE ONLY): 23 min   Charges:   PT Evaluation $PT Eval Moderate Complexity: 1 Procedure     PT G Codes:        Demone Lyles,KATHrine E 03/24/2016, 1:21 PM Carmelia Bake, PT, DPT 03/24/2016 Pager: [phone]

## 2016-03-24 NOTE — Progress Notes (Signed)
Md made aware of low BP prior to blood start. Ordered to go ahead with blood transfusion and recheck per protocol. Callie Fielding RN

## 2016-03-25 DIAGNOSIS — B0229 Other postherpetic nervous system involvement: Secondary | ICD-10-CM

## 2016-03-25 DIAGNOSIS — R627 Adult failure to thrive: Secondary | ICD-10-CM

## 2016-03-25 LAB — CBC
HCT: 26.6 % — ABNORMAL LOW (ref 36.0–46.0)
Hemoglobin: 8.5 g/dL — ABNORMAL LOW (ref 12.0–15.0)
MCH: 28.1 pg (ref 26.0–34.0)
MCHC: 32 g/dL (ref 30.0–36.0)
MCV: 87.8 fL (ref 78.0–100.0)
PLATELETS: 286 10*3/uL (ref 150–400)
RBC: 3.03 MIL/uL — ABNORMAL LOW (ref 3.87–5.11)
RDW: 21.3 % — AB (ref 11.5–15.5)
WBC: 2.5 10*3/uL — AB (ref 4.0–10.5)

## 2016-03-25 LAB — TYPE AND SCREEN
ABO/RH(D): O POS
ANTIBODY SCREEN: NEGATIVE
Unit division: 0

## 2016-03-25 LAB — BASIC METABOLIC PANEL
ANION GAP: 5 (ref 5–15)
BUN: 7 mg/dL (ref 6–20)
CALCIUM: 8.5 mg/dL — AB (ref 8.9–10.3)
CO2: 21 mmol/L — AB (ref 22–32)
CREATININE: 0.52 mg/dL (ref 0.44–1.00)
Chloride: 111 mmol/L (ref 101–111)
Glucose, Bld: 76 mg/dL (ref 65–99)
Potassium: 3.1 mmol/L — ABNORMAL LOW (ref 3.5–5.1)
SODIUM: 137 mmol/L (ref 135–145)

## 2016-03-25 LAB — LEGIONELLA PNEUMOPHILA SEROGP 1 UR AG: L. PNEUMOPHILA SEROGP 1 UR AG: NEGATIVE

## 2016-03-25 MED ORDER — SODIUM CHLORIDE 0.9 % IV BOLUS (SEPSIS)
500.0000 mL | Freq: Once | INTRAVENOUS | Status: AC
  Administered 2016-03-25: 500 mL via INTRAVENOUS

## 2016-03-25 MED ORDER — GABAPENTIN 100 MG PO CAPS
200.0000 mg | ORAL_CAPSULE | Freq: Three times a day (TID) | ORAL | Status: DC
  Administered 2016-03-25 – 2016-03-26 (×2): 200 mg via ORAL
  Filled 2016-03-25 (×2): qty 2

## 2016-03-25 MED ORDER — POTASSIUM CHLORIDE CRYS ER 20 MEQ PO TBCR
40.0000 meq | EXTENDED_RELEASE_TABLET | Freq: Once | ORAL | Status: AC
  Administered 2016-03-25: 40 meq via ORAL
  Filled 2016-03-25: qty 2

## 2016-03-25 MED ORDER — LEVOFLOXACIN 500 MG PO TABS
500.0000 mg | ORAL_TABLET | ORAL | Status: DC
  Administered 2016-03-25: 500 mg via ORAL
  Filled 2016-03-25: qty 1

## 2016-03-25 NOTE — Progress Notes (Signed)
PROGRESS NOTE  [name] [id number] DOB: 03/20/1956 DOA: 03/22/2016 PCP: Alesia Richards, MD    Assessment/Plan: Principal Problem:   HCAP (healthcare-associated pneumonia) Active Problems:   Essential hypertension   Breast cancer of upper-outer quadrant of right female breast ([place])   Atrial fibrillation ([place])   Herpes zoster   Hypokalemia   Acute encephalopathy   Dehydration   Protein-calorie malnutrition (HCC)   Postherpetic neuralgia   Physical deconditioning   FTT (failure to thrive) in adult  HCAP on room air, no respiratory distress cxr with "Right perihilar and right upper lobe peribronchial thickening and increased interstitial markings could reflect early bronchopneumonia or radiation changes." Blood culture in process, sputum culture pending collection she is started on vanc/cefepime on admission, vancd/ced on 6/6 due to mrsa screening negative and clinical improvement. -On 03/25/2016 cefepime was discontinued as she was transitioned to oral Levaquin 500 mg by mouth daily  Shingles:  with extensive skin breakdown and open wound left chest wall, family reported increased drainage, she states finished valtrex treatment at home, currently denies pain, iv acyclovir restarted , patient is on contact precaution and airborn precaution, infectious disease consulted. Wound care consulted. -Case was discussed with infectious disease, she was started on Valtrex 1,000 mg by mouth 3 times a day  Invasive ductal carcinoma of right breast:  Plan for neoadjuvant chemotherapy followed by breast conservation surgery and adjuvant radiation treatment.   planned neoadjuvant chemo stopped due to two hospitalizations, now third hospitalization -Last dose of chemotherapy given on 02/11/2016. -[facility]  Anemia:  -Hemoccult trended down to 7.5. -Suspect anemia related to malignancy/chronic disease. -Having presentation of generalized  weakness, functional decline, she was transfused with 1 unit of packed blood cells on 03/24/2016. -Repeat lab work on 03/25/2016 showing improved hemoglobin of 8.5.  Deconditioning. -Physical therapy consulted, face-to-face for home health services completed   Code Status: full  Family Communication: patient and family in room  Disposition Plan:  Plan for discharge to home in the next 24-48 hours   Consultants:  Infectious disease  Wound care  Procedures:  none  Antibiotics:  Levaquin 500 mg by mouth daily started on 03/25/2016  acyclovir   Objective: BP 94/60 mmHg  Pulse 88  Temp(Src) 98.7 F (37.1 C) (Oral)  Resp 16  Ht 5\' 2"  (1.575 m)  Wt 75.705 kg (166 lb 14.4 oz)  BMI 30.52 kg/m2  SpO2 96%  Intake/Output Summary (Last 24 hours) at 03/25/16 1532 Last data filed at 03/25/16 1300  Gross per 24 hour  Intake 1851.67 ml  Output    800 ml  Net 1051.67 ml   Filed Weights   03/23/16 0906  Weight: 75.705 kg (166 lb 14.4 oz)    Exam:   General:  Frail, weak, He is ambulating around her room with assistance  Cardiovascular: RRR  Respiratory: diminished, some crackles, no wheezing, no rhonchi,   abbdomen: Soft/ND/NT, positive BS  Musculoskeletal: No Edema  Neuro: aaox3  Skin: large open areas along left breast, left axilla, left back in deraotomal distribution., intertriginous dermatitis in the intra gluteal cleft  Data Reviewed: Basic Metabolic Panel:  Recent Labs Lab 03/22/16 1535 03/22/16 1900 03/23/16 0033 03/23/16 0619 03/24/16 0500 03/25/16 0500  NA 138 136  --  137 136 137  K 3.7 3.3*  --  3.0* 3.7 3.1*  CL 98 99*  --  105 109 111  CO2 28 25  --  24 23 21*  GLUCOSE 95 119*  --  90 70 76  BUN 6* 5*  --  7 7 7   CREATININE 0.60 0.74  --  0.69 0.66 0.52  CALCIUM 9.6 10.0  --  8.8* 8.3* 8.5*  MG  --   --  1.7  --   --   --    Liver Function Tests:  Recent Labs Lab 03/22/16 1535 03/22/16 1900 03/23/16 0619  AST 30 35 24  ALT  11 17 14   ALKPHOS 88 89 66  BILITOT 0.5 0.6 0.7  PROT 7.0 7.4 5.5*  ALBUMIN 3.4* 3.1* 2.3*   No results for input(s): LIPASE, AMYLASE in the last 168 hours. No results for input(s): AMMONIA in the last 168 hours. CBC:  Recent Labs Lab 03/22/16 1535 03/22/16 1900 03/23/16 0033 03/23/16 0619 03/24/16 0500 03/25/16 0500  WBC 3.6* 3.9* 4.0 2.8* 2.3* 2.5*  NEUTROABS 1476*  --  1.6* 1.1*  --   --   HGB 10.7* 10.7* 9.5* 8.0* 7.5* 8.5*  HCT 34.9* 35.2* 31.1* 26.6* 24.0* 26.6*  MCV 91.4 92.1 93.1 92.7 93.8 87.8  PLT 398 366 365 297 298 286   Cardiac Enzymes:   No results for input(s): CKTOTAL, CKMB, CKMBINDEX, TROPONINI in the last 168 hours. BNP (last 3 results) No results for input(s): BNP in the last 8760 hours.  ProBNP (last 3 results) No results for input(s): PROBNP in the last 8760 hours.  CBG: No results for input(s): GLUCAP in the last 168 hours.  Recent Results (from the past 240 hour(s))  Urine culture     Status: None   Collection Time: 03/22/16  3:29 PM  Result Value Ref Range Status   Colony Count 30,000 COLONIES/ML  Final   Organism ID, Bacteria Multiple bacterial morphotypes present, none  Final   Organism ID, Bacteria predominant. Suggest appropriate recollection if   Final   Organism ID, Bacteria clinically indicated.  Final  Urine culture     Status: None   Collection Time: 03/22/16  9:53 PM  Result Value Ref Range Status   Specimen Description URINE, CATHETERIZED  Final   Special Requests NONE  Final   Culture NO GROWTH  Final   Report Status 03/24/2016 FINAL  Final  Culture, blood (routine x 2)     Status: None (Preliminary result)   Collection Time: 03/23/16 12:28 AM  Result Value Ref Range Status   Specimen Description BLOOD RIGHT ARM  Final   Special Requests BOTTLES DRAWN AEROBIC AND ANAEROBIC 5ML  Final   Culture NO GROWTH 1 DAY  Final   Report Status PENDING  Incomplete  Culture, blood (routine x 2)     Status: None (Preliminary result)    Collection Time: 03/23/16 12:33 AM  Result Value Ref Range Status   Specimen Description BLOOD RIGHT HAND  Final   Special Requests BOTTLES DRAWN AEROBIC AND ANAEROBIC 5ML  Final   Culture NO GROWTH 1 DAY  Final   Report Status PENDING  Incomplete  MRSA PCR Screening     Status: None   Collection Time: 03/23/16 11:17 AM  Result Value Ref Range Status   MRSA by PCR NEGATIVE NEGATIVE Final    Comment:        The GeneXpert MRSA Assay (FDA approved for NASAL specimens only), is one component of a comprehensive MRSA colonization surveillance program. It is not intended to diagnose MRSA infection nor to guide or monitor treatment for MRSA infections.      Studies: No results found.  Scheduled Meds: . sodium chloride  Intravenous Once  . atenolol  100 mg Oral Daily  . atorvastatin  80 mg Oral Daily  . diazepam  2 mg Oral QHS  . diltiazem  120 mg Oral Daily  . doxepin  50 mg Oral QHS  . ferrous sulfate  325 mg Oral Q breakfast  . gabapentin  100 mg Oral TID  . guaiFENesin  600 mg Oral BID  . levofloxacin  500 mg Oral Daily  . montelukast  10 mg Oral Daily  . morphine  15 mg Oral Daily  . rivaroxaban  20 mg Oral Q supper  . valACYclovir  1,000 mg Oral TID    Continuous Infusions:     Time spent: 25 mins  Kelvin Cellar MD [facility] Pager [phone]. If 7PM-7AM, please contact night-coverage at [email or web address], password [facility] 03/25/2016, 3:32 PM  LOS: 2 days

## 2016-03-25 NOTE — Progress Notes (Signed)
Patient ID: [name], female   DOB: June 26, 1956, 60 y.o.   MRN: [id number]         [facility]    Date of Admission:  03/22/2016   Total days of antibiotics 3          Principal Problem:   HCAP (healthcare-associated pneumonia) Active Problems:   Breast cancer of upper-outer quadrant of right female breast ([place])   Herpes zoster   Protein-calorie malnutrition (HCC)   Postherpetic neuralgia   Essential hypertension   Atrial fibrillation (HCC)   Hypokalemia   Acute encephalopathy   Dehydration   Physical deconditioning   FTT (failure to thrive) in adult   . sodium chloride   Intravenous Once  . atenolol  100 mg Oral Daily  . atorvastatin  80 mg Oral Daily  . diazepam  2 mg Oral QHS  . diltiazem  120 mg Oral Daily  . doxepin  50 mg Oral QHS  . ferrous sulfate  325 mg Oral Q breakfast  . gabapentin  100 mg Oral TID  . guaiFENesin  600 mg Oral BID  . levofloxacin  500 mg Oral Q24H  . montelukast  10 mg Oral Daily  . morphine  15 mg Oral Daily  . rivaroxaban  20 mg Oral Q supper  . valACYclovir  1,000 mg Oral TID    SUBJECTIVE: She is feeling better. Her mouth pain has resolved and she has been able to eat more today. She denies any cough or shortness of breath. She is still having postherpetic pain in her left breast.  Review of Systems: Review of Systems  Constitutional: Positive for weight loss and malaise/fatigue. Negative for fever, chills and diaphoresis.  HENT: Negative for sore throat.   Respiratory: Negative for cough, sputum production and shortness of breath.   Cardiovascular: Positive for chest pain.  Skin: Positive for rash.  Neurological: Negative for headaches.    Past Medical History  Diagnosis Date  . MRSA (methicillin resistant staph aureus) culture positive 2009     none since per patient  . Eczema   . Hypercholesteremia   . Hypertension   . Kidney stones   . Anemia   . Vitamin D deficiency   . Paroxysmal  atrial fibrillation ([place]) 10/2011, 06/2015  . GERD (gastroesophageal reflux disease)   . Atrial fibrillation ([place]) 2016  . Dysrhythmia     A FIB - followed by Dr. Meda Coffee  . Breast cancer of upper-outer quadrant of right female breast ([place]) 12/12/2015  . Breast cancer ([place])   . Complication of anesthesia 10-06-15     slow to awaken after   . PONV (postoperative nausea and vomiting) 10-06-15    admitted back to hospital for dehydration  . Family history of breast cancer   . Family history of prostate cancer     Social History  Substance Use Topics  . Smoking status: Never Smoker   . Smokeless tobacco: Never Used  . Alcohol Use: No    Family History  Problem Relation Age of Onset  . Hypertension Mother   . Hepatitis C Mother   . Heart disease Father   . Dementia Father   . Aneurysm Brother   . Breast cancer Paternal Aunt     dx in her 60s  . Prostate cancer Paternal Uncle     40s  . Prostate cancer Paternal Uncle     dx in his 68s  . Leukemia Paternal Aunt   . Breast  cancer Cousin     mother's paternal first cousin  . Thyroid cancer Cousin     mother's paternal first cousin   Allergies  Allergen Reactions  . Penicillins Other (See Comments)    Has patient had a PCN reaction causing immediate rash, facial/tongue/throat swelling, SOB or lightheadedness with hypotension: NO Has patient had a PCN reaction causing severe rash involving mucus membranes or skin necrosis: NO Has patient had a PCN reaction that required hospitalization NO Has patient had a PCN reaction occurring within the last 10 years: NO If all of the above answers are "NO", then may proceed with Cephalosporin use.    OBJECTIVE: Filed Vitals:   03/25/16 0305 03/25/16 0637 03/25/16 1120 03/25/16 1344  BP: 92/61 86/48 115/85 94/60  Pulse: 77 71  88  Temp: 98.2 F (36.8 C) 97.6 F (36.4 C)  98.7 F (37.1 C)  TempSrc: Oral Oral  Oral  Resp: 18 18  16   Height:      Weight:      SpO2: 100% 100%  96%    Body mass index is 30.52 kg/(m^2).  Physical Exam  Constitutional: She is oriented to person, place, and time.  She is more alert and talkative.  HENT:  Mouth/Throat: No oropharyngeal exudate.  Eyes: Conjunctivae are normal.  Cardiovascular: Normal rate and regular rhythm.   No murmur heard. Pulmonary/Chest: Effort normal and breath sounds normal. She has no wheezes. She has no rales.  Port-A-Cath site looks good.  Abdominal: Soft. There is no tenderness.  Neurological: She is alert and oriented to person, place, and time.  Skin: Rash noted.   No change in healing shingles rash.  Psychiatric: Mood and affect normal.    Lab Results Lab Results  Component Value Date   WBC 2.5* 03/25/2016   HGB 8.5* 03/25/2016   HCT 26.6* 03/25/2016   MCV 87.8 03/25/2016   PLT 286 03/25/2016    Lab Results  Component Value Date   CREATININE 0.52 03/25/2016   BUN 7 03/25/2016   NA 137 03/25/2016   K 3.1* 03/25/2016   CL 111 03/25/2016   CO2 21* 03/25/2016    Lab Results  Component Value Date   ALT 14 03/23/2016   AST 24 03/23/2016   ALKPHOS 66 03/23/2016   BILITOT 0.7 03/23/2016     Microbiology: Recent Results (from the past 240 hour(s))  Urine culture     Status: None   Collection Time: 03/22/16  3:29 PM  Result Value Ref Range Status   Colony Count 30,000 COLONIES/ML  Final   Organism ID, Bacteria Multiple bacterial morphotypes present, none  Final   Organism ID, Bacteria predominant. Suggest appropriate recollection if   Final   Organism ID, Bacteria clinically indicated.  Final  Urine culture     Status: None   Collection Time: 03/22/16  9:53 PM  Result Value Ref Range Status   Specimen Description URINE, CATHETERIZED  Final   Special Requests NONE  Final   Culture NO GROWTH  Final   Report Status 03/24/2016 FINAL  Final  Culture, blood (routine x 2)     Status: None (Preliminary result)   Collection Time: 03/23/16 12:28 AM  Result Value Ref Range Status   Specimen  Description BLOOD RIGHT ARM  Final   Special Requests BOTTLES DRAWN AEROBIC AND ANAEROBIC 5ML  Final   Culture NO GROWTH 2 DAYS  Final   Report Status PENDING  Incomplete  Culture, blood (routine x 2)  Status: None (Preliminary result)   Collection Time: 03/23/16 12:33 AM  Result Value Ref Range Status   Specimen Description BLOOD RIGHT HAND  Final   Special Requests BOTTLES DRAWN AEROBIC AND ANAEROBIC 5ML  Final   Culture NO GROWTH 2 DAYS  Final   Report Status PENDING  Incomplete  MRSA PCR Screening     Status: None   Collection Time: 03/23/16 11:17 AM  Result Value Ref Range Status   MRSA by PCR NEGATIVE NEGATIVE Final    Comment:        The GeneXpert MRSA Assay (FDA approved for NASAL specimens only), is one component of a comprehensive MRSA colonization surveillance program. It is not intended to diagnose MRSA infection nor to guide or monitor treatment for MRSA infections.    Chest x-ray 03/24/2016  IMPRESSION: Stable right mid lung pneumonia. Followup PA and lateral chest X-ray is recommended in 3-4 weeks following trial of antibiotic therapy to ensure resolution and exclude underlying malignancy.   Electronically Signed  By: Marybelle Killings M.D.  On: 03/24/2016 13:50  ASSESSMENT: She is improving. Although her chest x-ray was read as being unchanged thickened looks actually a little bit better. The midlung infiltrate may be atelectasis given her lack of respiratory symptoms. I would continue current antibiotics at least 2 more days. I'm not actually sure that her shingles is active at this point but I would continue valacyclovir for 7 more days. I would also recommend increasing gabapentin to see if that helps with her postherpetic neuralgia. She needs continued efforts to improve her nutritional status and refers her deconditioning.  PLAN: 1. Continue current antibiotics at least 2 more days 2. Continue valacyclovir for 7 more days 3. Increase  gabapentin  Michel Bickers, MD [facility] [phone] pager   [phone] cell 03/25/2016, 4:29 PM

## 2016-03-25 NOTE — Progress Notes (Signed)
Occupational Therapy Treatment Patient Details Name: SHERRAE LEHRER MRN: [id number] DOB: 1956-05-06 Today's Date: 03/25/2016    History of present illness 60 y.o. female with history of breast cancer, afib (on Xarelto), HTN, HLD, who presented to the ED for evaluation of altered mental status, generalized weakness, and nausea/vomiting.  Also found to have shingles.   OT comments  Pt used RW today: tends to move/turn quickly and min guard still needed for unsteadiness.  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  Follow Up Recommendations  Supervision/Assistance - 24 hour    Equipment Recommendations  3 in 1 bedside comode    Recommendations for Other Services      Precautions / Restrictions Precautions Precautions: Fall Restrictions Weight Bearing Restrictions: No       Mobility Bed Mobility         Supine to sit: Min guard     General bed mobility comments: for safety when scooting to EOB. Pt able to get legs back in bed herself today  Transfers   Equipment used: Rolling walker (2 wheeled) Transfers: Sit to/from Stand Sit to Stand: Supervision         General transfer comment: cues for UE placement with RW    Balance                                   ADL       Grooming: Wash/dry hands;Supervision/safety;Standing                   Toilet Transfer: Min guard;Ambulation;Comfort height toilet;RW   Toileting- Water quality scientist and Hygiene: Min guard;Sit to/from stand         General ADL Comments: ambulated to bathroom using RW. Pt states she has one at home from her father.  Cues for safety with hand placement for sit to stand and also with turns as pt moves quickly.        Vision                     Perception     Praxis      Cognition   Behavior During Therapy: WFL for tasks assessed/performed Overall Cognitive Status: Within Functional Limits for tasks assessed                       Extremity/Trunk Assessment               Exercises     Shoulder Instructions       General Comments      Pertinent Vitals/ Pain       Pain Score: 4  Pain Location: shingles Pain Descriptors / Indicators: Sore Pain Intervention(s): Limited activity within patient's tolerance;Monitored during session;Repositioned  Home Living                                          Prior Functioning/Environment              Frequency       Progress Toward Goals  OT Goals(current goals can now be found in the care plan section)  Progress towards OT goals: Progressing toward goals     Plan      Co-evaluation  End of Session     Activity Tolerance Patient tolerated treatment well   Patient Left in bed;with call bell/phone within reach;with bed alarm set   Nurse Communication          Time: [id number] OT Time Calculation (min): 24 min  Charges: OT General Charges $OT Visit: 1 Procedure OT Treatments $Self Care/Home Management : 8-22 mins  Lakendra Helling 03/25/2016, 4:02 PM  Lesle Chris, OTR/L [phone] 03/25/2016

## 2016-03-25 NOTE — Progress Notes (Signed)
Pharmacy Antibiotic Note  [name] is a 60 y.o. female admitted on 03/22/2016 at [facility] with AMS, weakness, N/V, and pneumonia.  She was transferred to [facility].  PMH significant for breast cancer, and recent sepsis secondary to cellulitis and shingles (02/2016), and afib on Xarelto.   She is currently on D#3 Cefepime for pneumonia and Valtrex for Herpes Zoster. Renal fxn stable.  Patient clinically improving.    Plan:    Continue Cefepime 1g IV q8h -recommend change to oral abx once appropriate to complete 5-7 days of therapy  Continue Valtrex  No further dose adjustments anticipated.  Pharmacy to sign off- Please re-consult if needed.   Height: 5\' 2"  (157.5 cm) Weight: 166 lb 14.4 oz (75.705 kg) IBW/kg (Calculated) : 50.1  Ideal body weight: 50.1 kg (110 lb 7.2 oz) Adjusted ideal body weight: 60.343 kg (133 lb 0.5 oz)  BMI 30.519   Temp (24hrs), Avg:97.8 F (36.6 C), Min:94.9 F (34.9 C), Max:98.7 F (37.1 C)   Recent Labs Lab 03/22/16 1535 03/22/16 1900 03/23/16 0033 03/23/16 0042 03/23/16 0619 03/24/16 0500 03/25/16 0500  WBC 3.6* 3.9* 4.0  --  2.8* 2.3* 2.5*  CREATININE 0.60 0.74  --   --  0.69 0.66 0.52  LATICACIDVEN  --   --   --  1.27  --   --   --     Estimated Creatinine Clearance: 70.3 mL/min (by C-G formula based on Cr of 0.52).    Allergies  Allergen Reactions  . Penicillins Other (See Comments)    Has patient had a PCN reaction causing immediate rash, facial/tongue/throat swelling, SOB or lightheadedness with hypotension: NO Has patient had a PCN reaction causing severe rash involving mucus membranes or skin necrosis: NO Has patient had a PCN reaction that required hospitalization NO Has patient had a PCN reaction occurring within the last 10 years: NO If all of the above answers are "NO", then may proceed with Cephalosporin use.    Antimicrobials this admission: 6/5 Vanc >> 6/6 6/5 Cefepime >>  6/6 Acyclovir >> 6/6 Valtrex >>  Dose  adjustments this admission:  Microbiology results: 6/6 BCx: NGTD 6/5 (@1529 ) UCx: 30k colonies 6/5 (@2153 ) UCx: NGF 6/6 MRSA PCR: negative  Thank you for allowing pharmacy to be a part of this patient's care.  Netta Cedars, PharmD, BCPS Pager: [phone] 03/25/2016 9:58 AM

## 2016-03-26 ENCOUNTER — Ambulatory Visit: Payer: Self-pay | Admitting: Genetic Counselor

## 2016-03-26 ENCOUNTER — Telehealth: Payer: Self-pay | Admitting: Genetic Counselor

## 2016-03-26 DIAGNOSIS — C50411 Malignant neoplasm of upper-outer quadrant of right female breast: Secondary | ICD-10-CM

## 2016-03-26 DIAGNOSIS — Z1379 Encounter for other screening for genetic and chromosomal anomalies: Secondary | ICD-10-CM

## 2016-03-26 DIAGNOSIS — E860 Dehydration: Secondary | ICD-10-CM

## 2016-03-26 LAB — BASIC METABOLIC PANEL
ANION GAP: 4 — AB (ref 5–15)
BUN: <5 mg/dL — ABNORMAL LOW (ref 6–20)
CO2: 22 mmol/L (ref 22–32)
Calcium: 8.8 mg/dL — ABNORMAL LOW (ref 8.9–10.3)
Chloride: 112 mmol/L — ABNORMAL HIGH (ref 101–111)
Creatinine, Ser: 0.49 mg/dL (ref 0.44–1.00)
GFR calc Af Amer: >60 mL/min (ref >60)
GLUCOSE: 82 mg/dL (ref 65–99)
POTASSIUM: 3.3 mmol/L — AB (ref 3.5–5.1)
Sodium: 138 mmol/L (ref 135–145)

## 2016-03-26 LAB — CBC
HEMATOCRIT: 27.5 % — AB (ref 36.0–46.0)
HEMOGLOBIN: 8.9 g/dL — AB (ref 12.0–15.0)
MCH: 28.3 pg (ref 26.0–34.0)
MCHC: 32.4 g/dL (ref 30.0–36.0)
MCV: 87.6 fL (ref 78.0–100.0)
Platelets: 292 10*3/uL (ref 150–400)
RBC: 3.14 MIL/uL — ABNORMAL LOW (ref 3.87–5.11)
RDW: 20.6 % — ABNORMAL HIGH (ref 11.5–15.5)
WBC: 2.2 10*3/uL — ABNORMAL LOW (ref 4.0–10.5)

## 2016-03-26 MED ORDER — OXYCODONE-ACETAMINOPHEN 5-325 MG PO TABS
2.0000 | ORAL_TABLET | Freq: Three times a day (TID) | ORAL | Status: DC | PRN
Start: 2016-03-26 — End: 2016-04-14

## 2016-03-26 MED ORDER — LINEZOLID 600 MG PO TABS: 600.0000 mg | ORAL_TABLET | Freq: Two times a day (BID) | ORAL | Status: DC

## 2016-03-26 MED ORDER — LEVOFLOXACIN 500 MG PO TABS: 500.0000 mg | ORAL_TABLET | ORAL | Status: DC

## 2016-03-26 MED ORDER — HEPARIN SOD (PORK) LOCK FLUSH 100 UNIT/ML IV SOLN
500.0000 [IU] | INTRAVENOUS | Status: AC | PRN
  Administered 2016-03-26: 500 [IU]

## 2016-03-26 MED ORDER — MORPHINE SULFATE ER 15 MG PO TBCR: 15.0000 mg | EXTENDED_RELEASE_TABLET | Freq: Two times a day (BID) | ORAL | Status: DC

## 2016-03-26 MED ORDER — GABAPENTIN 100 MG PO CAPS: 200.0000 mg | ORAL_CAPSULE | Freq: Three times a day (TID) | ORAL | Status: DC

## 2016-03-26 MED ORDER — VALACYCLOVIR HCL 1 G PO TABS: 1000.0000 mg | ORAL_TABLET | Freq: Three times a day (TID) | ORAL | Status: DC

## 2016-03-26 NOTE — Progress Notes (Signed)
HPI: Ms. [name] was previously seen in the [facility] clinic due to a personal history of cancer and concerns regarding a hereditary predisposition to cancer. Please refer to our prior cancer genetics clinic note for more information regarding Ms. [name]'s medical, social and family histories, and our assessment and recommendations, at the time. Ms. [name] recent genetic test results were disclosed to her, as were recommendations warranted by these results. These results and recommendations are discussed in more detail below.  FAMILY HISTORY:  We obtained a detailed, 4-generation family history.  Significant diagnoses are listed below: Family History  Problem Relation Age of Onset  . Hypertension Mother   . Hepatitis C Mother   . Heart disease Father   . Dementia Father   . Aneurysm Brother   . Breast cancer Paternal Aunt     dx in her 66s  . Prostate cancer Paternal Uncle     74s  . Prostate cancer Paternal Uncle     dx in his 60s  . Leukemia Paternal Aunt   . Breast cancer Cousin     mother's paternal first cousin  . Thyroid cancer Cousin     mother's paternal first cousin    The patient has two daughters who are cancerfree. She has two full sisters and three full brothers, a maternal half brother and a paternal half sister who is [place] and they have never met. A full brother has been diagnosed with prostate cancer. The patient's father died of Alzheimer's disease. He had three brothers and three sisters. Two brothers had prostate cancer in their 71s-70s and one sister had breast cancer in her 38s. Her mother is alive at 79. She had four sisters and two brothers. A maternal first cousin has either Stage IV kidney disease, or kidney cancer. There is a distant relative with breast cancer and another with thyroid cancer. Patient's maternal ancestors are of African American descent, and paternal ancestors are of [place] American and Caucasian descent. There is  no reported Ashkenazi Jewish ancestry. There is no known consanguinity.  GENETIC TEST RESULTS: At the time of Ms. [name]'s visit, we recommended she pursue genetic testing of the Breast/Ovarian cancer gene panel. The Breast/Ovarian gene panel offered by [facility] includes sequencing and rearrangement analysis for the following 20 genes:  ATM, BARD1, BRCA1, BRCA2, BRIP1, CDH1, CHEK2, EPCAM, FANCC, MLH1, MSH2, MSH6, NBN, PALB2, PMS2, PTEN, RAD51C, RAD51D, TP53, and XRCC2.   The report date is Mar 16, 2016.  Genetic testing was normal, and did not reveal a deleterious mutation in these genes. The test report has been scanned into EPIC and is located under the Molecular Pathology section of the Results Review tab.   We discussed with Ms. [name] that since the current genetic testing is not perfect, it is possible there may be a gene mutation in one of these genes that current testing cannot detect, but that chance is small. We also discussed, that it is possible that another gene that has not yet been discovered, or that we have not yet tested, is responsible for the cancer diagnoses in the family, and it is, therefore, important to remain in touch with cancer genetics in the future so that we can continue to offer Ms. [name] the most up to date genetic testing.   CANCER SCREENING RECOMMENDATIONS: This result is reassuring and indicates that Ms. [name] likely does not have an increased risk for a future cancer due to a mutation in one of these genes. This normal test  also suggests that Ms. [name]'s cancer was most likely not due to an inherited predisposition associated with one of these genes.  Most cancers happen by chance and this negative test suggests that her cancer falls into this category.  We, therefore, recommended she continue to follow the cancer management and screening guidelines provided by her oncology and primary healthcare provider.   RECOMMENDATIONS FOR FAMILY MEMBERS: Women in this  family might be at some increased risk of developing cancer, over the general population risk, simply due to the family history of cancer. We recommended women in this family have a yearly mammogram beginning at age 24, or 64 years younger than the earliest onset of cancer, an an annual clinical breast exam, and perform monthly breast self-exams. Women in this family should also have a gynecological exam as recommended by their primary provider. All family members should have a colonoscopy by age 8.  FOLLOW-UP: Lastly, we discussed with Ms. [name] that cancer genetics is a rapidly advancing field and it is possible that new genetic tests will be appropriate for her and/or her family members in the future. We encouraged her to remain in contact with cancer genetics on an annual basis so we can update her personal and family histories and let her know of advances in cancer genetics that may benefit this family.   Our contact number was provided. Ms. [name] questions were answered to her satisfaction, and she knows she is welcome to call us at anytime with additional questions or concerns.   Roma Kayser, MS, [facility] Certified Genetic Counselor [email or web address]

## 2016-03-26 NOTE — Progress Notes (Signed)
Physical Therapy Treatment Patient Details Name: [name] MRN: [id number] DOB: [birth year] Today's Date: 03/26/2016    History of Present Illness 60 y.o. female with history of breast cancer, afib (on Xarelto), HTN, HLD, who presented to the ED for evaluation of altered mental status, generalized weakness, and nausea/vomiting.  Also found to have shingles.    PT Comments    Pt highly motivated and eager to walk.  Pt stated she is a freq neighborhood walker.  Assisted out of recliner and dressed with appropriate PPE to amb in hallway holding to IV pole.  Good alternating gait and no LOB.  Tolerated a great distance with no c/o's.  Assisted back to room.  Assisted to bathroom all at Supervision level.    Follow Up Recommendations  Home health PT     Equipment Recommendations       Recommendations for Other Services       Precautions / Restrictions Precautions Precautions: Fall Precaution Comments: Airborne and Contact Precaution Restrictions Weight Bearing Restrictions: No    Mobility  Bed Mobility               General bed mobility comments: OOB in recliner  Transfers Overall transfer level: Needs assistance Equipment used: None Transfers: Sit to/from Stand Sit to Stand: Supervision         General transfer comment: Supervision for safety of IV line and pole only  Ambulation/Gait Ambulation/Gait assistance: Supervision Ambulation Distance (Feet): 225 Feet Assistive device: None Gait Pattern/deviations: Step-through pattern;Decreased stride length Gait velocity: WFL   General Gait Details: pt pushing IV pole.  Good alternating gait.  amb outside of room with appropriate PPE applied on pt and myself.  No LOB.  No need for any AD.   Stairs            Wheelchair Mobility    Modified Rankin (Stroke Patients Only)       Balance                                    Cognition Arousal/Alertness: Awake/alert Behavior During  Therapy: WFL for tasks assessed/performed Overall Cognitive Status: Within Functional Limits for tasks assessed                      Exercises      General Comments        Pertinent Vitals/Pain Pain Assessment: No/denies pain    Home Living                      Prior Function            PT Goals (current goals can now be found in the care plan section) Progress towards PT goals: Progressing toward goals    Frequency  Min 3X/week    PT Plan Current plan remains appropriate    Co-evaluation             End of Session Equipment Utilized During Treatment: Gait belt Activity Tolerance: Patient tolerated treatment well Patient left: in chair;with chair alarm set;with call bell/phone within reach     Time: 1133-1158 PT Time Calculation (min) (ACUTE ONLY): 25 min  Charges:  $Gait Training: 8-22 mins $Therapeutic Activity: 8-22 mins                    G Codes:      Catarino Vold  PTA [employer]  Acute  Rehab Pager      [phone]

## 2016-03-26 NOTE — Telephone Encounter (Signed)
Revealed negative genetic testing on the Breast/Ovarian cancer panel.  

## 2016-03-26 NOTE — Discharge Summary (Signed)
Physician Discharge Summary  CONSUELA Church [id number] DOB: Mar 26, 1956 DOA: 03/22/2016  PCP: Alesia Richards, MD  Admit date: 03/22/2016 Discharge date: 03/26/2016  Time spent: 35 minutes  Recommendations for Outpatient Follow-up:  1. On discharge she was instructed to follow-up with her primary oncologist Dr.Gudena regarding history of breast cancer and resumption of therapy. 2. She was discharged on Levaquin and oral linezolid as recommended by infectious disease for treatment of presumed HCAP 3. Prior to discharge she was set up with home health services for PT and RN   Discharge Diagnoses:  Principal Problem:   HCAP (healthcare-associated pneumonia) Active Problems:   Essential hypertension   Breast cancer of upper-outer quadrant of right female breast ([place])   Atrial fibrillation ([place])   Herpes zoster   Hypokalemia   Acute encephalopathy   Dehydration   Protein-calorie malnutrition (HCC)   Postherpetic neuralgia   Physical deconditioning   FTT (failure to thrive) in adult   Discharge Condition: Stable  Diet recommendation: Regular diet  Filed Weights   03/23/16 0906  Weight: 75.705 kg (166 lb 14.4 oz)    History of present illness:  [name] is a 60 y.o. female with medical history significant of breast cancer  Presented with generalized fatigue she had a fever last week but it has resolved. Currently had been having generalized body aches associated chest pain or shortness of breath some mild cough She had had nausea vomiting and diarrhea for past 1 month. She had at least 1 episode of nonbloody emesis today Family notes that she's been acting more confused and has Worsens a chemotherapy in April since she became more confused daughter brought her to the hospital  Hospital Course:   HCAP -Initial workup included cxr that showed "Right perihilar and right upper lobe peribronchial thickening and increased interstitial markings could reflect early  bronchopneumonia or radiation changes." -Blood cultures drawn on 03/23/2016 showing no growth -During this hospitalization she was treated with vanc/cefepime. -Given clinical stability cefepime was discontinued and she was transitioned to oral Levaquin on 03/25/2016 -Dr. Megan Salon of infectious disease recommending discharging her on oral linezolid and Cipro for 3 days  Shingles:  -On exam she extensive skin breakdown and open wound left chest wall, family reported increased drainage, she states finished valtrex treatment at home, currently denies pain, iv acyclovir restarted , patient is on contact precaution and airborn precaution, infectious disease consulted. Wound care consulted. -Case was discussed with infectious disease, she was started on Valtrex 1,000 mg by mouth 3 times a day -She was discharged on 7 days of Valtrex, please follow-up in the clinic  Invasive ductal carcinoma of right breast:  Plan for neoadjuvant chemotherapy followed by breast conservation surgery and adjuvant radiation treatment.  planned neoadjuvant chemo stopped due to two hospitalizations, now third hospitalization -Last dose of chemotherapy given on 02/11/2016. -She will follow-up at the [facility] with Dr. Lindi Adie  Anemia:  -Hemoccult trended down to 7.5. -Suspect anemia related to malignancy/chronic disease. -Having presentation of generalized weakness, functional decline, she was transfused with 1 unit of packed blood cells on 03/24/2016. -Repeat lab work on 03/25/2016 showing improved hemoglobin of 8.5. -Repeat CBC on hospital follow-up visit  Deconditioning. -Physical therapy consulted, face-to-face for home health services completed   Consultations:  Infectious disease  Wound care  Discharge Exam: Filed Vitals:   03/26/16 0215 03/26/16 0445  BP: 109/74 107/65  Pulse: 85 87  Temp: 98.3 F (36.8 C) 98.4 F (36.9 C)  Resp: 16 18  General: Looks better today, awake and alert,  sitting at bedside chair  Cardiovascular: RRR  Respiratory: diminished, some crackles, no wheezing, no rhonchi,   abbdomen: Soft/ND/NT, positive BS  Musculoskeletal: No Edema  Neuro: aaox3  Skin: large open areas along left breast, left axilla, left back in deraotomal distribution., intertriginous dermatitis in the intra gluteal cleft  Discharge Instructions   Discharge Instructions    Call MD for:  difficulty breathing, headache or visual disturbances    Complete by:  As directed      Call MD for:  extreme fatigue    Complete by:  As directed      Call MD for:  hives    Complete by:  As directed      Call MD for:  persistant dizziness or light-headedness    Complete by:  As directed      Call MD for:  persistant nausea and vomiting    Complete by:  As directed      Call MD for:  redness, tenderness, or signs of infection (pain, swelling, redness, odor or green/yellow discharge around incision site)    Complete by:  As directed      Call MD for:  severe uncontrolled pain    Complete by:  As directed      Call MD for:  temperature >100.4    Complete by:  As directed      Call MD for:    Complete by:  As directed      Diet - low sodium heart healthy    Complete by:  As directed      Increase activity slowly    Complete by:  As directed           Current Discharge Medication List    START taking these medications   Details  levofloxacin (LEVAQUIN) 500 MG tablet Take 1 tablet (500 mg total) by mouth daily. Qty: 4 tablet, Refills: 0    linezolid (ZYVOX) 600 MG tablet Take 1 tablet (600 mg total) by mouth 2 (two) times daily. Qty: 6 tablet, Refills: 0    valACYclovir (VALTREX) 1000 MG tablet Take 1 tablet (1,000 mg total) by mouth 3 (three) times daily. Qty: 7 tablet, Refills: 0      CONTINUE these medications which have CHANGED   Details  gabapentin (NEURONTIN) 100 MG capsule Take 2 capsules (200 mg total) by mouth 3 (three) times daily. Qty: 90 capsule,  Refills: 1    morphine (MS CONTIN) 15 MG 12 hr tablet Take 1 tablet (15 mg total) by mouth every 12 (twelve) hours. Qty: 6 tablet, Refills: 0    oxyCODONE-acetaminophen (PERCOCET/ROXICET) 5-325 MG tablet Take 2 tablets by mouth every 8 (eight) hours as needed for severe pain. Qty: 10 tablet, Refills: 0      CONTINUE these medications which have NOT CHANGED   Details  atenolol (TENORMIN) 100 MG tablet take 1/2 to 1 tablet by mouth once daily as directed Qty: 90 tablet, Refills: 1    atorvastatin (LIPITOR) 80 MG tablet take 1 tablet by mouth once daily Qty: 30 tablet, Refills: 3    cholecalciferol (VITAMIN D) 1000 UNITS tablet Take 4,000 Units by mouth daily.    diazepam (VALIUM) 5 MG tablet Take 5 mg by mouth every morning.     diltiazem (CARDIZEM CD) 120 MG 24 hr capsule Take 1 capsule (120 mg total) by mouth daily. Qty: 90 capsule, Refills: 3    ferrous sulfate 325 (65 FE) MG tablet Take  325 mg by mouth daily with breakfast.    montelukast (SINGULAIR) 10 MG tablet Take 1 tablet (10 mg total) by mouth daily. Qty: 30 tablet, Refills: 2    PAZEO 0.7 % SOLN Place 1 drop into both eyes daily as needed (allergies).     potassium chloride SA (K-DUR,KLOR-CON) 20 MEQ tablet Take 20 mEq by mouth 2 (two) times daily.    prochlorperazine (COMPAZINE) 10 MG tablet Take 1 tablet (10 mg total) by mouth every 6 (six) hours as needed (Nausea or vomiting). Qty: 30 tablet, Refills: 1   Associated Diagnoses: Breast cancer of upper-outer quadrant of right female breast (HCC)    rivaroxaban (XARELTO) 20 MG TABS tablet Take 1 tablet (20 mg total) by mouth daily with supper. Qty: 90 tablet, Refills: 3    triamcinolone cream (KENALOG) 0.1 % Apply 1 application topically 3 (three) times daily. Qty: 80 g, Refills: 0    triamterene-hydrochlorothiazide (MAXZIDE-25) 37.5-25 MG tablet Take 1 tablet by mouth daily.    doxepin (SINEQUAN) 50 MG capsule take 1 capsule by mouth once daily at bedtime Qty:  30 capsule, Refills: 1      STOP taking these medications     LORazepam (ATIVAN) 0.5 MG tablet        Allergies  Allergen Reactions  . Penicillins Other (See Comments)    Has patient had a PCN reaction causing immediate rash, facial/tongue/throat swelling, SOB or lightheadedness with hypotension: NO Has patient had a PCN reaction causing severe rash involving mucus membranes or skin necrosis: NO Has patient had a PCN reaction that required hospitalization NO Has patient had a PCN reaction occurring within the last 10 years: NO If all of the above answers are "NO", then may proceed with Cephalosporin use.   Follow-up Information    Follow up with MCKEOWN,WILLIAM DAVID, MD In 2 weeks.   Specialty:  Internal Medicine   Contact information:   [address] [place] [address] [phone]       Follow up with Rulon Eisenmenger, MD In 1 week.   Specialty:  Hematology and Oncology   Contact information:   [place] [id number] [phone]        The results of significant diagnostics from this hospitalization (including imaging, microbiology, ancillary and laboratory) are listed below for reference.    Significant Diagnostic Studies: Dg Chest 1 View  03/24/2016  CLINICAL DATA:  Short of breath EXAM: CHEST 1 VIEW COMPARISON:  03/22/2016 FINDINGS: Pneumonia in the right mid lung zone is stable. Left subclavian Port-A-Cath is stable. Normal heart size. Low lung volumes. No pneumothorax. IMPRESSION: Stable right mid lung pneumonia. Followup PA and lateral chest X-ray is recommended in 3-4 weeks following trial of antibiotic therapy to ensure resolution and exclude underlying malignancy. Electronically Signed   By: Marybelle Killings M.D.   On: 03/24/2016 13:50   Dg Chest 2 View  03/22/2016  CLINICAL DATA:  [place] tonight.  Weakness. EXAM: CHEST  2 VIEW COMPARISON:  Chest x-ray 02/23/2016 and chest CT 01/30/2016 FINDINGS: The Port-A-Cath is stable. Right  perihilar and right upper lobe peribronchial thickening could reflect bronchitis or early bronchopneumonia. Radiation changes are also possible. The left lung is relatively clear. No pleural effusions. Stable mild right-sided pleural thickening. The bony thorax is intact. IMPRESSION: Right perihilar and right upper lobe peribronchial thickening and increased interstitial markings could reflect early bronchopneumonia or radiation changes. Electronically Signed   By: Marijo Sanes M.D.   On: 03/22/2016  20:35   Ct Head Wo Contrast  03/22/2016  CLINICAL DATA:  [place] 1 week ago and hit head. Generalized weakness. EXAM: CT HEAD WITHOUT CONTRAST TECHNIQUE: Contiguous axial images were obtained from the base of the skull through the vertex without intravenous contrast. COMPARISON:  07/27/2008 FINDINGS: The ventricles are normal in size and configuration. No extra-axial fluid collections are identified. The gray-white differentiation is normal. No CT findings for acute intracranial process such as hemorrhage or infarction. No mass lesions. The brainstem and cerebellum are grossly normal. The bony structures are intact. Hyperostosis frontalis interna noted. The paranasal sinuses and mastoid air cells are clear. The globes are intact. IMPRESSION: No acute intracranial findings or mass lesion. Electronically Signed   By: Marijo Sanes M.D.   On: 03/22/2016 20:52   Mr Breast Bilateral W Wo Contrast  03/05/2016  CLINICAL DATA:  60 year old female with recent diagnosis of invasive ductal carcinoma in the right breast with biopsy-proven right axillary lymph node involvement. The patient has been on neoadjuvant chemotherapy in preparation for breast conserving therapy, however is not tolerating chemotherapy well. Therefore, the patient is deciding between a unilateral or bilateral mastectomy following the outcome of her genetics testing. LABS:  Most recent laboratory values are from 02/26/2016 with creatinine of 0.7 mg/dL and  GFR of 85. EXAM: BILATERAL BREAST MRI WITH AND WITHOUT CONTRAST TECHNIQUE: Multiplanar, multisequence MR images of both breasts were obtained prior to and following the intravenous administration of 15 ml of MultiHance. THREE-DIMENSIONAL MR IMAGE RENDERING ON INDEPENDENT WORKSTATION: Three-dimensional MR images were rendered by post-processing of the original MR data on an independent workstation. The three-dimensional MR images were interpreted, and findings are reported in the following complete MRI report for this study. Three dimensional images were evaluated at the independent DynaCad workstation COMPARISON:  Previous exam(s). FINDINGS: Breast composition: b. Scattered fibroglandular tissue. Background parenchymal enhancement: Minimal. Right breast: The biopsy-proven malignancy in the upper-outer quadrant of the right breast has slightly decreased in size 2.9 x 2.6 x 2.5 cm, previously measuring 3.2 x 2.9 x 2.4 cm. Left breast: Interval placement of a left chest Port-A-Cath. No interval change in size or appearance of the 3.0 cm enhancing benign mass in the retroareolar upper outer left breast consistent with a hamartoma. No new mass or abnormal enhancement. Lymph nodes: There is significant decrease in size in the biopsy proven metastatic right axillary lymph node which on the prior exam measured up to 2.1 cm and long axis, now measuring 0.9 cm. Ancillary findings:  None. IMPRESSION: 1. Slight decrease in size of the malignancy in the upper-outer right breast. 2. Marked decrease in size of the biopsy-proven metastatic right axillary lymph node. 3.  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 4.  No MRI evidence of left breast malignancy. RECOMMENDATION: Continue treatment plan. BI-RADS CATEGORY  6: Known biopsy-proven malignancy. Electronically Signed   By: Ammie Ferrier M.D.   On: 03/05/2016 15:33   Mr Card Morphology Wo/w Cm  03/12/2016  CLINICAL DATA:  60 year old female with h/o  breast ca on therapy with Herceptin and RV hypertrabeculation and dilatation. EXAM: CARDIAC MRI TECHNIQUE: The patient was scanned on a 1.5 Tesla GE magnet. A dedicated cardiac coil was used. Functional imaging was done using Fiesta sequences. 2,3, and 4 chamber views were done to assess for RWMA's. Modified Simpson's rule using a short axis stack was used to calculate an ejection fraction on a dedicated work Conservation officer, nature. The patient received 25 cc of  Multihance. After 10 minutes inversion recovery sequences were used to assess for infiltration and scar tissue. CONTRAST:  25 cc  of Multihance FINDINGS: 1. Normal left ventricular size, thickness and systolic function (LVEF = 62%) with no regional wall motion abnormalities. There is no late gadolinium enhancement found in the LV myocardium. LVEDD; 49 mm LVESD:  33 mm LVEDV:  95 ml LVESV:  36 ml SV:  59 ml CO:  5.3 L/min Myocardial mass:  71 g 2. Normal right ventricular size, with significant hypertrabeculation of the RV myocardium most pronounced in the apical regions. However no regional wall motion abnormalities are seen. Overall quantified RV size and systolic function are normal. No mass is seen. RVEDV:  110 ml RVESV:  51 ml SV:  59 ml CO:  5.4 L/minute 3. Mild left and right atrial dilatation. Lipomatous hypertrophy of the interatrial septum. 4.  Mild mitral and mild tricuspid regurgitation. 5. Normal size of the aortic root, ascending aorta and pulmonary artery. 6.  No pericardial effusion. IMPRESSION: 1. Normal left ventricular size, thickness and systolic function (LVEF = 62%) with no regional wall motion abnormalities. There is no late gadolinium enhancement found in the LV myocardium. 2. Normal right ventricular size, with significant hypertrabeculation of the RV myocardium most pronounced in the apical regions. However no regional wall motion abnormalities are seen. Overall quantified RV size and systolic function are normal. No mass is  seen. 3. Mild left and right atrial dilatation. Lipomatous hypertrophy of the interatrial septum. 4.  Mild mitral and mild tricuspid regurgitation. 5. Normal size of the aortic root, ascending aorta and pulmonary artery. 6.  Normal pericardium, no pericardial effusion. Ena Dawley Electronically Signed   By: Ena Dawley   On: 03/12/2016 19:10    Microbiology: Recent Results (from the past 240 hour(s))  Urine culture     Status: None   Collection Time: 03/22/16  3:29 PM  Result Value Ref Range Status   Colony Count 30,000 COLONIES/ML  Final   Organism ID, Bacteria Multiple bacterial morphotypes present, none  Final   Organism ID, Bacteria predominant. Suggest appropriate recollection if   Final   Organism ID, Bacteria clinically indicated.  Final  Urine culture     Status: None   Collection Time: 03/22/16  9:53 PM  Result Value Ref Range Status   Specimen Description URINE, CATHETERIZED  Final   Special Requests NONE  Final   Culture NO GROWTH  Final   Report Status 03/24/2016 FINAL  Final  Culture, blood (routine x 2)     Status: None (Preliminary result)   Collection Time: 03/23/16 12:28 AM  Result Value Ref Range Status   Specimen Description BLOOD RIGHT ARM  Final   Special Requests BOTTLES DRAWN AEROBIC AND ANAEROBIC 5ML  Final   Culture NO GROWTH 2 DAYS  Final   Report Status PENDING  Incomplete  Culture, blood (routine x 2)     Status: None (Preliminary result)   Collection Time: 03/23/16 12:33 AM  Result Value Ref Range Status   Specimen Description BLOOD RIGHT HAND  Final   Special Requests BOTTLES DRAWN AEROBIC AND ANAEROBIC 5ML  Final   Culture NO GROWTH 2 DAYS  Final   Report Status PENDING  Incomplete  MRSA PCR Screening     Status: None   Collection Time: 03/23/16 11:17 AM  Result Value Ref Range Status   MRSA by PCR NEGATIVE NEGATIVE Final    Comment:        The GeneXpert  MRSA Assay (FDA approved for NASAL specimens only), is one component of  a comprehensive MRSA colonization surveillance program. It is not intended to diagnose MRSA infection nor to guide or monitor treatment for MRSA infections.      Labs: Basic Metabolic Panel:  Recent Labs Lab 03/22/16 1900 03/23/16 0033 03/23/16 0619 03/24/16 0500 03/25/16 0500 03/26/16 0500  NA 136  --  137 136 137 138  K 3.3*  --  3.0* 3.7 3.1* 3.3*  CL 99*  --  105 109 111 112*  CO2 25  --  24 23 21* 22  GLUCOSE 119*  --  90 70 76 82  BUN 5*  --  7 7 7  <5*  CREATININE 0.74  --  0.69 0.66 0.52 0.49  CALCIUM 10.0  --  8.8* 8.3* 8.5* 8.8*  MG  --  1.7  --   --   --   --    Liver Function Tests:  Recent Labs Lab 03/22/16 1535 03/22/16 1900 03/23/16 0619  AST 30 35 24  ALT 11 17 14   ALKPHOS 88 89 66  BILITOT 0.5 0.6 0.7  PROT 7.0 7.4 5.5*  ALBUMIN 3.4* 3.1* 2.3*   No results for input(s): LIPASE, AMYLASE in the last 168 hours. No results for input(s): AMMONIA in the last 168 hours. CBC:  Recent Labs Lab 03/22/16 1535  03/23/16 0033 03/23/16 0619 03/24/16 0500 03/25/16 0500 03/26/16 0500  WBC 3.6*  < > 4.0 2.8* 2.3* 2.5* 2.2*  NEUTROABS 1476*  --  1.6* 1.1*  --   --   --   HGB 10.7*  < > 9.5* 8.0* 7.5* 8.5* 8.9*  HCT 34.9*  < > 31.1* 26.6* 24.0* 26.6* 27.5*  MCV 91.4  < > 93.1 92.7 93.8 87.8 87.6  PLT 398  < > 365 297 298 286 292  < > = values in this interval not displayed. Cardiac Enzymes: No results for input(s): CKTOTAL, CKMB, CKMBINDEX, TROPONINI in the last 168 hours. BNP: BNP (last 3 results) No results for input(s): BNP in the last 8760 hours.  ProBNP (last 3 results) No results for input(s): PROBNP in the last 8760 hours.  CBG: No results for input(s): GLUCAP in the last 168 hours.     Signed:  Kelvin Cellar MD.  [facility] 03/26/2016, 12:51 PM

## 2016-03-26 NOTE — Progress Notes (Signed)
Patient ID: [name], female   DOB: July 06, 1956, 60 y.o.   MRN: [id number]         [facility]    Date of Admission:  03/22/2016   Total days of antibiotics 4          Principal Problem:   HCAP (healthcare-associated pneumonia) Active Problems:   Breast cancer of upper-outer quadrant of right female breast ([place])   Herpes zoster   Protein-calorie malnutrition (HCC)   Postherpetic neuralgia   Essential hypertension   Atrial fibrillation (HCC)   Hypokalemia   Acute encephalopathy   Dehydration   Physical deconditioning   FTT (failure to thrive) in adult   . sodium chloride   Intravenous Once  . atenolol  100 mg Oral Daily  . atorvastatin  80 mg Oral Daily  . diazepam  2 mg Oral QHS  . diltiazem  120 mg Oral Daily  . doxepin  50 mg Oral QHS  . ferrous sulfate  325 mg Oral Q breakfast  . gabapentin  200 mg Oral TID  . guaiFENesin  600 mg Oral BID  . levofloxacin  500 mg Oral Q24H  . montelukast  10 mg Oral Daily  . morphine  15 mg Oral Daily  . rivaroxaban  20 mg Oral Q supper  . valACYclovir  1,000 mg Oral TID    SUBJECTIVE: She states that she is feeling much better other than her ongoing postherpetic pain in her left breast. She has no cough or shortness of breath. Her appetite remains poor but she is eating. She is requesting to go home today.  Review of Systems: Review of Systems  Constitutional: Positive for weight loss and malaise/fatigue. Negative for fever, chills and diaphoresis.  HENT: Negative for sore throat.   Respiratory: Negative for cough, sputum production and shortness of breath.   Cardiovascular: Positive for chest pain.  Skin: Positive for rash.  Neurological: Negative for headaches.    Past Medical History  Diagnosis Date  . MRSA (methicillin resistant staph aureus) culture positive 2009     none since per patient  . Eczema   . Hypercholesteremia   . Hypertension   . Kidney stones   . Anemia   . Vitamin  D deficiency   . Paroxysmal atrial fibrillation ([place]) 10/2011, 06/2015  . GERD (gastroesophageal reflux disease)   . Atrial fibrillation ([place]) 2016  . Dysrhythmia     A FIB - followed by Dr. Meda Coffee  . Breast cancer of upper-outer quadrant of right female breast ([place]) 12/12/2015  . Breast cancer ([place])   . Complication of anesthesia 10-06-15     slow to awaken after   . PONV (postoperative nausea and vomiting) 10-06-15    admitted back to hospital for dehydration  . Family history of breast cancer   . Family history of prostate cancer     Social History  Substance Use Topics  . Smoking status: Never Smoker   . Smokeless tobacco: Never Used  . Alcohol Use: No    Family History  Problem Relation Age of Onset  . Hypertension Mother   . Hepatitis C Mother   . Heart disease Father   . Dementia Father   . Aneurysm Brother   . Breast cancer Paternal Aunt     dx in her 32s  . Prostate cancer Paternal Uncle     27s  . Prostate cancer Paternal Uncle     dx in his 48s  . Leukemia Paternal  Aunt   . Breast cancer Cousin     mother's paternal first cousin  . Thyroid cancer Cousin     mother's paternal first cousin   Allergies  Allergen Reactions  . Penicillins Other (See Comments)    Has patient had a PCN reaction causing immediate rash, facial/tongue/throat swelling, SOB or lightheadedness with hypotension: NO Has patient had a PCN reaction causing severe rash involving mucus membranes or skin necrosis: NO Has patient had a PCN reaction that required hospitalization NO Has patient had a PCN reaction occurring within the last 10 years: NO If all of the above answers are "NO", then may proceed with Cephalosporin use.    OBJECTIVE: Filed Vitals:   03/25/16 1344 03/25/16 2057 03/26/16 0215 03/26/16 0445  BP: 94/60 119/70 109/74 107/65  Pulse: 88 95 85 87  Temp: 98.7 F (37.1 C) 99.1 F (37.3 C) 98.3 F (36.8 C) 98.4 F (36.9 C)  TempSrc: Oral Oral Oral Oral  Resp: 16 16 16  18   Height:      Weight:      SpO2: 96% 100% 98% 97%   Body mass index is 30.52 kg/(m^2).  Physical Exam  Constitutional: She is oriented to person, place, and time.  She is more alert and talkative.  HENT:  Mouth/Throat: No oropharyngeal exudate.  Eyes: Conjunctivae are normal.  Cardiovascular: Normal rate and regular rhythm.   No murmur heard. Pulmonary/Chest: Effort normal and breath sounds normal. She has no wheezes. She has no rales.     Abdominal: Soft. There is no tenderness.  Neurological: She is alert and oriented to person, place, and time.  Skin: Rash noted.   No change in healing shingles rash.  Psychiatric: Mood and affect normal.    Lab Results Lab Results  Component Value Date   WBC 2.2* 03/26/2016   HGB 8.9* 03/26/2016   HCT 27.5* 03/26/2016   MCV 87.6 03/26/2016   PLT 292 03/26/2016    Lab Results  Component Value Date   CREATININE 0.49 03/26/2016   BUN <5* 03/26/2016   NA 138 03/26/2016   K 3.3* 03/26/2016   CL 112* 03/26/2016   CO2 22 03/26/2016    Lab Results  Component Value Date   ALT 14 03/23/2016   AST 24 03/23/2016   ALKPHOS 66 03/23/2016   BILITOT 0.7 03/23/2016     Microbiology: Recent Results (from the past 240 hour(s))  Urine culture     Status: None   Collection Time: 03/22/16  3:29 PM  Result Value Ref Range Status   Colony Count 30,000 COLONIES/ML  Final   Organism ID, Bacteria Multiple bacterial morphotypes present, none  Final   Organism ID, Bacteria predominant. Suggest appropriate recollection if   Final   Organism ID, Bacteria clinically indicated.  Final  Urine culture     Status: None   Collection Time: 03/22/16  9:53 PM  Result Value Ref Range Status   Specimen Description URINE, CATHETERIZED  Final   Special Requests NONE  Final   Culture NO GROWTH  Final   Report Status 03/24/2016 FINAL  Final  Culture, blood (routine x 2)     Status: None (Preliminary result)   Collection Time: 03/23/16 12:28 AM  Result  Value Ref Range Status   Specimen Description BLOOD RIGHT ARM  Final   Special Requests BOTTLES DRAWN AEROBIC AND ANAEROBIC 5ML  Final   Culture NO GROWTH 2 DAYS  Final   Report Status PENDING  Incomplete  Culture, blood (routine  x 2)     Status: None (Preliminary result)   Collection Time: 03/23/16 12:33 AM  Result Value Ref Range Status   Specimen Description BLOOD RIGHT HAND  Final   Special Requests BOTTLES DRAWN AEROBIC AND ANAEROBIC 5ML  Final   Culture NO GROWTH 2 DAYS  Final   Report Status PENDING  Incomplete  MRSA PCR Screening     Status: None   Collection Time: 03/23/16 11:17 AM  Result Value Ref Range Status   MRSA by PCR NEGATIVE NEGATIVE Final    Comment:        The GeneXpert MRSA Assay (FDA approved for NASAL specimens only), is one component of a comprehensive MRSA colonization surveillance program. It is not intended to diagnose MRSA infection nor to guide or monitor treatment for MRSA infections.    Chest x-ray 03/24/2016  IMPRESSION: Stable right mid lung pneumonia. Followup PA and lateral chest X-ray is recommended in 3-4 weeks following trial of antibiotic therapy to ensure resolution and exclude underlying malignancy.   Electronically Signed  By: Marybelle Killings M.D.  On: 03/24/2016 13:50  ASSESSMENT: She is doing much better. She's not had any fever since admission has no respiratory symptoms. Her recent chest x-ray showed underaeration and a right midlung infiltrate/atelectasis that was slightly improved since admission. There are several options now. If her insurance will approve it she should be discharged on oral ciprofloxacin and linezolid for 3 more days. Another option would be to continue IV antibiotics overnight and then let her go home tomorrow.  PLAN: 1. Consider discharge on oral linezolid and ciprofloxacin for 3 more days versus 24 more hours of IV antibiotics and then discharged home 2. Continue valacyclovir for 6 more  days 3. Please call Dr. Lita Mains [phone]) for any infectious disease questions this weekend  Michel Bickers, MD [facility] [place] [phone] pager   [phone] cell 03/26/2016, 11:00 AM

## 2016-03-27 ENCOUNTER — Encounter: Payer: Self-pay | Admitting: Internal Medicine

## 2016-03-27 DIAGNOSIS — J189 Pneumonia, unspecified organism: Secondary | ICD-10-CM | POA: Diagnosis not present

## 2016-03-27 DIAGNOSIS — C50411 Malignant neoplasm of upper-outer quadrant of right female breast: Secondary | ICD-10-CM | POA: Diagnosis not present

## 2016-03-27 DIAGNOSIS — I10 Essential (primary) hypertension: Secondary | ICD-10-CM | POA: Diagnosis not present

## 2016-03-27 DIAGNOSIS — I4891 Unspecified atrial fibrillation: Secondary | ICD-10-CM | POA: Diagnosis not present

## 2016-03-28 ENCOUNTER — Encounter (HOSPITAL_COMMUNITY): Payer: Self-pay | Admitting: Emergency Medicine

## 2016-03-28 ENCOUNTER — Emergency Department (HOSPITAL_COMMUNITY)
Admission: EM | Admit: 2016-03-28 | Discharge: 2016-03-28 | Disposition: A | Discharge status: Home / Self Care | Payer: BC Managed Care – PPO | Attending: Emergency Medicine | Admitting: Emergency Medicine

## 2016-03-28 DIAGNOSIS — Z79899 Other long term (current) drug therapy: Secondary | ICD-10-CM | POA: Diagnosis not present

## 2016-03-28 DIAGNOSIS — R5381 Other malaise: Secondary | ICD-10-CM | POA: Insufficient documentation

## 2016-03-28 DIAGNOSIS — R197 Diarrhea, unspecified: Secondary | ICD-10-CM | POA: Diagnosis present

## 2016-03-28 DIAGNOSIS — R112 Nausea with vomiting, unspecified: Secondary | ICD-10-CM

## 2016-03-28 DIAGNOSIS — Z7901 Long term (current) use of anticoagulants: Secondary | ICD-10-CM | POA: Diagnosis not present

## 2016-03-28 DIAGNOSIS — Z853 Personal history of malignant neoplasm of breast: Secondary | ICD-10-CM | POA: Insufficient documentation

## 2016-03-28 DIAGNOSIS — I10 Essential (primary) hypertension: Secondary | ICD-10-CM | POA: Diagnosis not present

## 2016-03-28 LAB — URINALYSIS, ROUTINE W REFLEX MICROSCOPIC
Bilirubin Urine: NEGATIVE
Glucose, UA: NEGATIVE mg/dL
Ketones, ur: 40 mg/dL — AB
Nitrite: NEGATIVE
Protein, ur: NEGATIVE mg/dL
Specific Gravity, Urine: 1.015 (ref 1.005–1.030)
pH: 6.5 (ref 5.0–8.0)

## 2016-03-28 LAB — CBC
HCT: 33.1 % — ABNORMAL LOW (ref 36.0–46.0)
Hemoglobin: 10.8 g/dL — ABNORMAL LOW (ref 12.0–15.0)
MCH: 28.1 pg (ref 26.0–34.0)
MCHC: 32.6 g/dL (ref 30.0–36.0)
MCV: 86.2 fL (ref 78.0–100.0)
Platelets: 382 10*3/uL (ref 150–400)
RBC: 3.84 MIL/uL — ABNORMAL LOW (ref 3.87–5.11)
RDW: 19.8 % — ABNORMAL HIGH (ref 11.5–15.5)
WBC: 3.1 10*3/uL — ABNORMAL LOW (ref 4.0–10.5)

## 2016-03-28 LAB — COMPREHENSIVE METABOLIC PANEL
ALT: 13 U/L — ABNORMAL LOW (ref 14–54)
AST: 26 U/L (ref 15–41)
Albumin: 2.8 g/dL — ABNORMAL LOW (ref 3.5–5.0)
Alkaline Phosphatase: 71 U/L (ref 38–126)
Anion gap: 10 (ref 5–15)
BUN: <5 mg/dL — ABNORMAL LOW (ref 6–20)
CO2: 26 mmol/L (ref 22–32)
Calcium: 9.4 mg/dL (ref 8.9–10.3)
Chloride: 101 mmol/L (ref 101–111)
Creatinine, Ser: 0.45 mg/dL (ref 0.44–1.00)
GFR calc Af Amer: >60 mL/min (ref >60)
GFR calc non Af Amer: >60 mL/min (ref >60)
Glucose, Bld: 80 mg/dL (ref 65–99)
Potassium: 2.7 mmol/L — CL (ref 3.5–5.1)
Sodium: 137 mmol/L (ref 135–145)
Total Bilirubin: 0.7 mg/dL (ref 0.3–1.2)
Total Protein: 6.4 g/dL — ABNORMAL LOW (ref 6.5–8.1)

## 2016-03-28 LAB — CULTURE, BLOOD (ROUTINE X 2)
CULTURE: NO GROWTH
Culture: NO GROWTH

## 2016-03-28 LAB — URINE MICROSCOPIC-ADD ON

## 2016-03-28 LAB — LIPASE, BLOOD: Lipase: 22 U/L (ref 11–51)

## 2016-03-28 LAB — C DIFFICILE QUICK SCREEN W PCR REFLEX
C Diff antigen: NEGATIVE
C Diff interpretation: NEGATIVE
C Diff toxin: NEGATIVE

## 2016-03-28 LAB — MAGNESIUM: Magnesium: 1.2 mg/dL — ABNORMAL LOW (ref 1.7–2.4)

## 2016-03-28 MED ORDER — PROCHLORPERAZINE EDISYLATE 5 MG/ML IJ SOLN
10.0000 mg | Freq: Once | INTRAMUSCULAR | Status: AC
  Administered 2016-03-28: 10 mg via INTRAVENOUS
  Filled 2016-03-28: qty 2

## 2016-03-28 MED ORDER — MAGNESIUM SULFATE 2 GM/50ML IV SOLN
2.0000 g | Freq: Once | INTRAVENOUS | Status: AC
  Administered 2016-03-28: 2 g via INTRAVENOUS
  Filled 2016-03-28: qty 50

## 2016-03-28 MED ORDER — HEPARIN SOD (PORK) LOCK FLUSH 100 UNIT/ML IV SOLN
500.0000 [IU] | Freq: Once | INTRAVENOUS | Status: AC
  Administered 2016-03-28: 500 [IU]
  Filled 2016-03-28: qty 5

## 2016-03-28 MED ORDER — HYDROMORPHONE HCL 1 MG/ML IJ SOLN
0.5000 mg | Freq: Once | INTRAMUSCULAR | Status: AC
  Administered 2016-03-28: 0.5 mg via INTRAVENOUS
  Filled 2016-03-28: qty 1

## 2016-03-28 MED ORDER — ONDANSETRON HCL 4 MG PO TABS: 4.0000 mg | ORAL_TABLET | Freq: Four times a day (QID) | ORAL | Status: DC

## 2016-03-28 MED ORDER — ONDANSETRON HCL 8 MG PO TABS: ORAL_TABLET | ORAL | Status: DC

## 2016-03-28 MED ORDER — MORPHINE SULFATE ER 15 MG PO TBCR: 15.0000 mg | EXTENDED_RELEASE_TABLET | Freq: Two times a day (BID) | ORAL | Status: DC | PRN

## 2016-03-28 MED ORDER — ONDANSETRON HCL 4 MG/2ML IJ SOLN
4.0000 mg | Freq: Once | INTRAMUSCULAR | Status: AC | PRN
  Administered 2016-03-28: 4 mg via INTRAVENOUS
  Filled 2016-03-28: qty 2

## 2016-03-28 MED ORDER — POTASSIUM CHLORIDE 10 MEQ/100ML IV SOLN
10.0000 meq | INTRAVENOUS | Status: AC
  Administered 2016-03-28: 10 meq via INTRAVENOUS
  Filled 2016-03-28: qty 100

## 2016-03-28 MED ORDER — PROMETHAZINE HCL 12.5 MG PO TABS
25.0000 mg | ORAL_TABLET | Freq: Four times a day (QID) | ORAL | Status: DC | PRN
Start: 2016-03-28 — End: 2016-04-10

## 2016-03-28 MED ORDER — POTASSIUM CHLORIDE CRYS ER 20 MEQ PO TBCR
60.0000 meq | EXTENDED_RELEASE_TABLET | Freq: Once | ORAL | Status: AC
  Administered 2016-03-28: 60 meq via ORAL
  Filled 2016-03-28: qty 3

## 2016-03-28 MED ORDER — SODIUM CHLORIDE 0.9 % IV BOLUS (SEPSIS)
1000.0000 mL | Freq: Once | INTRAVENOUS | Status: AC
  Administered 2016-03-28: 1000 mL via INTRAVENOUS

## 2016-03-28 NOTE — ED Notes (Signed)
RN at bedside, accessing patient's port

## 2016-03-28 NOTE — ED Notes (Signed)
Pt c/o diarrhea, abdominal pain, and emesis onset Friday after being discharged from hospital for Pneumonia. Patient has ongoing issues with nausea and emesis. Active shingles infection with open lesions to lateral and posterior thorax. History of breast cancer, last chemo treatment on April.

## 2016-03-28 NOTE — ED Notes (Signed)
Huber Needle deaccessessed from chest port.

## 2016-03-28 NOTE — Care Management Note (Signed)
Case Management Note  Patient Details  Name: [name] MRN: [id number] Date of Birth: [birth year]  Subjective/Objective:      n/v              Action/Plan: Discharge Planning: AVS reviewed:  NCM spoke to dtr, Vira Agar. States Anna RN came out on yesterday. States hospital bed is older bed and she is not happy with bed. Contacted [facility] Liaison and dtr spoke to her via phone. [facility] will arrange pick up of hospital bed. Will resume HH with [facility]. Dtr wants full electric bed.   1600 NCM contacted [facility], spoke to [place] and they have a different version of hospital bed offered by [facility]. Faxed orders to [facility]. Attempted to call dtr and left message for [facility]. No working number for [employer].   Expected Discharge Date:  03/28/2016               Expected Discharge Plan:  [place]  In-House Referral:  NA  Discharge planning Services  CM Consult  Post Acute Care Choice:  Home Health, Resumption of Svcs/PTA Provider Choice offered to:  Adult Children  DME Arranged:  Hospital bed DME Agency:  [facility] Arranged:  RN, PT [facility] Agency:  [place]  Status of Service:  Completed, signed off  Medicare Important Message Given:    Date Medicare IM Given:    Medicare IM give by:    Date Additional Medicare IM Given:    Additional Medicare Important Message give by:     If discussed at [place] of Stay Meetings, dates discussed:    Additional Comments:  Erenest Rasher, RN 03/28/2016, 4:06 PM

## 2016-03-28 NOTE — Discharge Instructions (Signed)
Diarrhea Diarrhea is frequent loose and watery bowel movements. It can cause you to feel weak and dehydrated. Dehydration can cause you to become tired and thirsty, have a dry mouth, and have decreased urination that often is dark yellow. Diarrhea is a sign of another problem, most often an infection that will not last long. In most cases, diarrhea typically lasts 2-3 days. However, it can last longer if it is a sign of something more serious. It is important to treat your diarrhea as directed by your caregiver to lessen or prevent future episodes of diarrhea. CAUSES  Some common causes include:  Gastrointestinal infections caused by viruses, bacteria, or parasites.  Food poisoning or food allergies.  Certain medicines, such as antibiotics, chemotherapy, and laxatives.  Artificial sweeteners and fructose.  Digestive disorders. HOME CARE INSTRUCTIONS  Ensure adequate fluid intake (hydration): Have 1 cup (8 oz) of fluid for each diarrhea episode. Avoid fluids that contain simple sugars or sports drinks, fruit juices, whole milk products, and sodas. Your urine should be clear or pale yellow if you are drinking enough fluids. Hydrate with an oral rehydration solution that you can purchase at pharmacies, retail stores, and online. You can prepare an oral rehydration solution at home by mixing the following ingredients together:   - tsp table salt.   tsp baking soda.   tsp salt substitute containing potassium chloride.  1  tablespoons sugar.  1 L (34 oz) of water.  Certain foods and beverages may increase the speed at which food moves through the gastrointestinal (GI) tract. These foods and beverages should be avoided and include:  Caffeinated and alcoholic beverages.  High-fiber foods, such as raw fruits and vegetables, nuts, seeds, and whole grain breads and cereals.  Foods and beverages sweetened with sugar alcohols, such as xylitol, sorbitol, and mannitol.  Some foods may be well  tolerated and may help thicken stool including:  Starchy foods, such as rice, toast, pasta, low-sugar cereal, oatmeal, grits, baked potatoes, crackers, and bagels.  Bananas.  Applesauce.  Add probiotic-rich foods to help increase healthy bacteria in the GI tract, such as yogurt and fermented milk products.  Wash your hands well after each diarrhea episode.  Only take over-the-counter or prescription medicines as directed by your caregiver.  Take a warm bath to relieve any burning or pain from frequent diarrhea episodes. SEEK IMMEDIATE MEDICAL CARE IF:   You are unable to keep fluids down.  You have persistent vomiting.  You have blood in your stool, or your stools are black and tarry.  You do not urinate in 6-8 hours, or there is only a small amount of very dark urine.  You have abdominal pain that increases or localizes.  You have weakness, dizziness, confusion, or light-headedness.  You have a severe headache.  Your diarrhea gets worse or does not get better.  You have a fever or persistent symptoms for more than 2-3 days.  You have a fever and your symptoms suddenly get worse. MAKE SURE YOU:   Understand these instructions.  Will watch your condition.  Will get help right away if you are not doing well or get worse.   This information is not intended to replace advice given to you by your health care provider. Make sure you discuss any questions you have with your health care provider.   Document Released: 09/24/2002 Document Revised: 10/25/2014 Document Reviewed: 06/11/2012 Elsevier Interactive Patient Education 2016 [place] refers to the changes in your body that occur  during a period of inactivity. Deconditioning results in changes to your heart, lungs, and muscles. These changes decrease your ability to endure activity, resulting in feelings of fatigue and weakness. Deconditioning can occur after only a few days of bed  rest or inactivity. The longer the period of inactivity, the more severe your symptoms of deconditioning will be. After longer periods of inactivity, it will also take longer for you to return to your previous level of functioning. Deconditioning can be mild, moderate, or severe:  Mild. Your condition interferes with your ability to perform your usual types of exercise, such as running, biking, or swimming.  Moderate. Your condition interferes with your ability to do normal everyday activities. This may include walking, grocery shopping, or doing chores or lawn work.  Severe. Your condition interferes with your ability to perform minimal activity or normal self-care. CAUSES  Some common reasons for inactivity that may result in deconditioning include:  Illnesses, such as cancer, stroke, heart attack, fibromyalgia, or chronic fatigue syndrome.  Injuries, especially back injuries, broken bones, or injury to ligaments or tendons.  Surgery or a long stay in the hospital for any reason.  Pregnancy, especially with conditions that require long periods of bed rest. RISK FACTORS Anything that results in a period of hospitalization or bed rest will put you at risk of deconditioning. Some other factors that can increase the risk include:  Obesity.  Poor nutrition.  Old age.  Injuries or illnesses that interfere with movement and activity. SIGNS AND SYMPTOMS  Feeling weak.  Feeling tired.  Shortness of breath with minor exertion.  Your heart beating faster than normal. You may or may not notice this without taking your pulse.  Pain or discomfort with activity.  Decreased strength.  Decreased sense of balance.  Decreased endurance.  Difficulty participating in usual forms of exercise.  Difficulty doing activities of daily living, such as grocery shopping or chores.  Difficulty walking around the house and doing basic self-care, such as getting to the bathroom, preparing meals,  or doing laundry. DIAGNOSIS  There is no specific test to diagnose deconditioning. Your health care provider will take your medical history and do a physical exam. During the physical exam, the health care provider will check for signs of deconditioning, such as:  Decreased size of muscles.  Decreased strength.  Difficulty with balance.  Shortness of breath or abnormally increased heart rate after minor exertion. TREATMENT  Treatment usually involves a structured exercise program in which activity is increased gradually. Your health care provider will determine which exercises are right for you. The exercise program will likely include aerobic exercise and strength training. Aerobic exercise helps improve the functioning of the heart and lungs as well as the muscles. Strength training helps improve muscle size and strength. Both of these types of exercise will improve your endurance. You may be referred to a physical therapist who can create a safe strengthening program for you to follow. HOME CARE INSTRUCTIONS  Follow the exercise program recommended by your health care provider or physical therapist.  Do not increase your exercise any faster than directed.  Eat a healthy diet.  If your health care provider thinks that you need to lose weight, consider seeing a dietitian to help you do so in a healthy way.  Do not use any tobacco products, including cigarettes, chewing tobacco, or electronic cigarettes. If you need help quitting, ask your health care provider.  Take medicines only as directed by your health care provider.  Keep all follow-up visits as directed by your health care provider. This is important. SEEK MEDICAL CARE IF:  You are not able to carry out the prescribed exercise program.  You are not able to carry out your usual level of activity.  You are having trouble doing normal household chores or caring for yourself.  You are becoming increasingly fatigued and  weak.  You become light-headed when rising to a sitting or standing position.  Your level of endurance decreases after having improved. SEEK IMMEDIATE MEDICAL CARE IF:  You have chest pain.  You are very short of breath.  You have any episodes of passing out.   This information is not intended to replace advice given to you by your health care provider. Make sure you discuss any questions you have with your health care provider.   Document Released: 02/18/2014 Document Reviewed: 02/18/2014 Elsevier Interactive Patient Education 2016 Elsevier Inc[employer]  Nausea and Vomiting Nausea is a sick feeling that often comes before throwing up (vomiting). Vomiting is a reflex where stomach contents come out of your mouth. Vomiting can cause severe loss of body fluids (dehydration). Children and elderly adults can become dehydrated quickly, especially if they also have diarrhea. Nausea and vomiting are symptoms of a condition or disease. It is important to find the cause of your symptoms. CAUSES   Direct irritation of the stomach lining. This irritation can result from increased acid production (gastroesophageal reflux disease), infection, food poisoning, taking certain medicines (such as nonsteroidal anti-inflammatory drugs), alcohol use, or tobacco use.  Signals from the brain.These signals could be caused by a headache, heat exposure, an inner ear disturbance, increased pressure in the brain from injury, infection, a tumor, or a concussion, pain, emotional stimulus, or metabolic problems.  An obstruction in the gastrointestinal tract (bowel obstruction).  Illnesses such as diabetes, hepatitis, gallbladder problems, appendicitis, kidney problems, cancer, sepsis, atypical symptoms of a heart attack, or eating disorders.  Medical treatments such as chemotherapy and radiation.  Receiving medicine that makes you sleep (general anesthetic) during surgery. DIAGNOSIS Your caregiver may ask for tests to  be done if the problems do not improve after a few days. Tests may also be done if symptoms are severe or if the reason for the nausea and vomiting is not clear. Tests may include:  Urine tests.  Blood tests.  Stool tests.  Cultures (to look for evidence of infection).  X-rays or other imaging studies. Test results can help your caregiver make decisions about treatment or the need for additional tests. TREATMENT You need to stay well hydrated. Drink frequently but in small amounts.You may wish to drink water, sports drinks, clear broth, or eat frozen ice pops or gelatin dessert to help stay hydrated.When you eat, eating slowly may help prevent nausea.There are also some antinausea medicines that may help prevent nausea. HOME CARE INSTRUCTIONS   Take all medicine as directed by your caregiver.  If you do not have an appetite, do not force yourself to eat. However, you must continue to drink fluids.  If you have an appetite, eat a normal diet unless your caregiver tells you differently.  Eat a variety of complex carbohydrates (rice, wheat, potatoes, bread), lean meats, yogurt, fruits, and vegetables.  Avoid high-fat foods because they are more difficult to digest.  Drink enough water and fluids to keep your urine clear or pale yellow.  If you are dehydrated, ask your caregiver for specific rehydration instructions. Signs of dehydration may include:  Severe thirst.  Dry lips and mouth.  Dizziness.  Dark urine.  Decreasing urine frequency and amount.  Confusion.  Rapid breathing or pulse. SEEK IMMEDIATE MEDICAL CARE IF:   You have blood or brown flecks (like coffee grounds) in your vomit.  You have black or bloody stools.  You have a severe headache or stiff neck.  You are confused.  You have severe abdominal pain.  You have chest pain or trouble breathing.  You do not urinate at least once every 8 hours.  You develop cold or clammy skin.  You continue to  vomit for longer than 24 to 48 hours.  You have a fever. MAKE SURE YOU:   Understand these instructions.  Will watch your condition.  Will get help right away if you are not doing well or get worse.   This information is not intended to replace advice given to you by your health care provider. Make sure you discuss any questions you have with your health care provider.   Document Released: 10/04/2005 Document Revised: 12/27/2011 Document Reviewed: 03/03/2011 Elsevier Interactive Patient Education [employer].

## 2016-03-28 NOTE — ED Notes (Signed)
MD at bedside. 

## 2016-03-29 ENCOUNTER — Other Ambulatory Visit: Payer: Self-pay

## 2016-03-29 ENCOUNTER — Ambulatory Visit (HOSPITAL_BASED_OUTPATIENT_CLINIC_OR_DEPARTMENT_OTHER)
Admission: RE | Admit: 2016-03-29 | Discharge: 2016-03-29 | Disposition: A | Discharge status: Home / Self Care | Payer: BC Managed Care – PPO | Source: Ambulatory Visit | Attending: Internal Medicine | Admitting: Internal Medicine

## 2016-03-29 ENCOUNTER — Ambulatory Visit (HOSPITAL_COMMUNITY)
Admission: RE | Admit: 2016-03-29 | Discharge: 2016-03-29 | Disposition: A | Discharge status: Home / Self Care | Payer: BC Managed Care – PPO | Source: Ambulatory Visit | Attending: Internal Medicine | Admitting: Internal Medicine

## 2016-03-29 VITALS — BP 123/76 | HR 96 | Resp 18 | Wt 175.5 lb

## 2016-03-29 DIAGNOSIS — I10 Essential (primary) hypertension: Secondary | ICD-10-CM | POA: Diagnosis not present

## 2016-03-29 DIAGNOSIS — L309 Dermatitis, unspecified: Secondary | ICD-10-CM | POA: Diagnosis not present

## 2016-03-29 DIAGNOSIS — Z880 Allergy status to penicillin: Secondary | ICD-10-CM | POA: Insufficient documentation

## 2016-03-29 DIAGNOSIS — I480 Paroxysmal atrial fibrillation: Secondary | ICD-10-CM | POA: Diagnosis not present

## 2016-03-29 DIAGNOSIS — R0609 Other forms of dyspnea: Secondary | ICD-10-CM | POA: Diagnosis not present

## 2016-03-29 DIAGNOSIS — Z7901 Long term (current) use of anticoagulants: Secondary | ICD-10-CM | POA: Insufficient documentation

## 2016-03-29 DIAGNOSIS — Z79899 Other long term (current) drug therapy: Secondary | ICD-10-CM | POA: Diagnosis not present

## 2016-03-29 DIAGNOSIS — R609 Edema, unspecified: Secondary | ICD-10-CM | POA: Insufficient documentation

## 2016-03-29 DIAGNOSIS — C50411 Malignant neoplasm of upper-outer quadrant of right female breast: Secondary | ICD-10-CM

## 2016-03-29 DIAGNOSIS — Z87442 Personal history of urinary calculi: Secondary | ICD-10-CM | POA: Insufficient documentation

## 2016-03-29 DIAGNOSIS — Z171 Estrogen receptor negative status [ER-]: Secondary | ICD-10-CM | POA: Insufficient documentation

## 2016-03-29 MED ORDER — POTASSIUM CHLORIDE CRYS ER 20 MEQ PO TBCR: 20.0000 meq | EXTENDED_RELEASE_TABLET | Freq: Two times a day (BID) | ORAL | Status: AC

## 2016-03-29 NOTE — Progress Notes (Signed)
Patient ID: [name], female   DOB: September 17, 1956, 60 y.o.   MRN: [id number] Patient ID: [name], female   DOB: 06/16/1956, 60 y.o.   MRN: [id number]   CARDIO-ONCOLOGY CLINIC  NOTE  Referring Physician: Lindi Adie Primary cardiologist: Meda Coffee   HPI:  [name] is a 60 y/o woman with HTN, HL, PAF, severe eczema, kidney stones and right breast CA (diagnosed 2/17) who is referred by Dr. Lindi Adie to the Cardio-oncology Program. She has followed with Dr. Meda Coffee in Cardiology for AF. She does not have known CAD. She had negative Myoview in 12/15.   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 RV function normal. Grade II DD. Lat s' 11.2 cm/s GLS -23.9% Echo 03/29/16: EF 60-65% RV mildly dilated with heavily trabecualted apex. RV function normal. Grade I DD. Lat s' 11.6 cm/s GLS -16.4% (underestimated due to poor endocardial tracking)    At her initial visit here in March RV was dilated so we ordered PFTs and cMRI. Started Herceptin 01/01/16. Admitted last week for HCAP and shingles. Heceptin stopped completely on 02/12/16 because she couldn't tolerate and she was hospitalized many times for weakness. Pending evaluation with Dr. Barry Dienes for a lumpectomy. Remains weak. But feels ok. + DOE.   PFTs 4/17: FEV1 1.7 (89%) FVC  1.7 (71%) DLCO 67%  cMRI (5/17): LVEF 62% RV normal size and function with hyper-trabecualtion of RV apex  CT chest 5/17: showed  minimal ILD. No PE  Current Outpatient Prescriptions  Medication Sig Dispense Refill  . acetaminophen (TYLENOL) 650 MG CR tablet Take 1,300 mg by mouth every 8 (eight) hours as needed for pain.    Marland Kitchen atenolol (TENORMIN) 100 MG tablet Take 100 mg by mouth daily.    Marland Kitchen atorvastatin (LIPITOR) 80 MG tablet take 1 tablet by mouth once daily 30 tablet 3  . cholecalciferol (VITAMIN D) 1000 UNITS tablet Take 4,000 Units by mouth daily.    . diazepam (VALIUM) 5 MG tablet Take 5 mg by mouth every morning.     . diltiazem (CARDIZEM CD) 120 MG 24 hr capsule Take 1 capsule (120 mg total) by mouth daily. 90 capsule 3  . ferrous sulfate 325 (65 FE) MG tablet Take 325 mg by mouth daily with breakfast.    . gabapentin (NEURONTIN) 100 MG capsule Take 2 capsules (200 mg total) by mouth 3 (three) times daily. 90 capsule 1  . levofloxacin (LEVAQUIN) 500 MG tablet Take 1 tablet (500 mg total) by mouth daily. 4 tablet 0  . linezolid (ZYVOX) 600 MG tablet Take 1 tablet (600 mg total) by mouth 2 (two) times daily. 6 tablet 0  . LORazepam (ATIVAN) 0.5 MG tablet Take 0.5 mg by mouth every 6 (six) hours as needed. anxiety    . montelukast (SINGULAIR) 10 MG tablet Take 1 tablet (10 mg total) by mouth daily. 30 tablet 2  . morphine (MS CONTIN) 15 MG 12 hr tablet Take 1 tablet (15 mg total) by mouth every  12 (twelve) hours as needed for pain. 10 tablet 0  . ondansetron (ZOFRAN) 4 MG tablet Take 1 tablet (4 mg total) by mouth every 6 (six) hours. 20 tablet 0  . oxyCODONE-acetaminophen (PERCOCET/ROXICET) 5-325 MG tablet Take 2 tablets by mouth every 8 (eight) hours as needed for severe pain. 10 tablet 0  . PAZEO 0.7 % SOLN Place 1 drop into both eyes daily as needed (allergies).     . potassium chloride SA (K-DUR,KLOR-CON) 20 MEQ tablet Take 20 mEq by mouth 2 (two) times daily.    . prochlorperazine (COMPAZINE) 10 MG tablet Take 1 tablet (10 mg total) by mouth every 6 (six) hours as needed (Nausea or vomiting). 30 tablet 1  .  promethazine (PHENERGAN) 12.5 MG tablet Take 2 tablets (25 mg total) by mouth every 6 (six) hours as needed for nausea or vomiting. 20 tablet 00  . rivaroxaban (XARELTO) 20 MG TABS tablet Take 1 tablet (20 mg total) by mouth daily with supper. 90 tablet 3  . triamcinolone cream (KENALOG) 0.1 % Apply 1 application topically 3 (three) times daily. 80 g 0  . triamterene-hydrochlorothiazide (MAXZIDE-25) 37.5-25 MG tablet Take 1 tablet by mouth daily.    . valACYclovir (VALTREX) 1000 MG tablet Take 1 tablet (1,000 mg total) by mouth 3 (three) times daily. 7 tablet 0   No current facility-administered medications for this encounter.    Allergies  Allergen Reactions  . Penicillins Other (See Comments)    Has patient had a PCN reaction causing immediate rash, facial/tongue/throat swelling, SOB or lightheadedness with hypotension: NO Has patient had a PCN reaction causing severe rash involving mucus membranes or skin necrosis: NO Has patient had a PCN reaction that required hospitalization NO Has patient had a PCN reaction occurring within the last 10 years: NO If all of the above answers are "NO", then may proceed with Cephalosporin use.     Filed Vitals:   03/29/16 1414  BP: 123/76  Pulse: 96  Resp: 18  Weight: 175 lb 8 oz (79.606 kg)  SpO2: 96%    PHYSICAL EXAM: General:  Well appearing. No respiratory difficulty HEENT: normal Neck: supple. JVP 7-8  Carotids 2+ bilat; no bruits. No lymphadenopathy or thryomegaly appreciated. Cor: PMI nondisplaced. Regular rate & rhythm. No rubs, gallops or murmurs. Left chest port a cath Lungs: clear but decreased BS Abdomen: soft, nontender, nondistended. No hepatosplenomegaly. No bruits or masses. Good bowel sounds. Extremities: no cyanosis, clubbing, rash, tr-1+ edema. + diffuse eczema Neuro: alert & oriented x 3, cranial nerves grossly intact. moves all 4 extremities w/o difficulty. Affect pleasant.   ASSESSMENT & PLAN: 1. Breast cancer ER/PR -  HER-2 + -I reviewed echos personally. EF and Doppler parameters stable. Herceptin stopped as she was unable to tolerate due to weakness.  2. PAF on Xarelto 3. Dilated RV on echo - cMRI, PFTs and CT scan of chest done. RV normal except for prominent apical trabeculations which are probably benign. Minimal ILD on CT chest. Will follow. 4. Severe eczema 5. Mild edema -take one extra dose of Maxzide tomorrow  No further chemo at this point. So will have her f/u with Dr. Meda Coffee.    Audrey Shafer,MD 2:41 PM

## 2016-03-30 ENCOUNTER — Encounter: Payer: Self-pay | Admitting: Hematology and Oncology

## 2016-03-30 ENCOUNTER — Ambulatory Visit (INDEPENDENT_AMBULATORY_CARE_PROVIDER_SITE_OTHER): Payer: BC Managed Care – PPO | Admitting: Internal Medicine

## 2016-03-30 ENCOUNTER — Encounter: Payer: Self-pay | Admitting: Internal Medicine

## 2016-03-30 ENCOUNTER — Ambulatory Visit (HOSPITAL_BASED_OUTPATIENT_CLINIC_OR_DEPARTMENT_OTHER): Payer: BC Managed Care – PPO | Admitting: Hematology and Oncology

## 2016-03-30 VITALS — BP 108/62 | HR 86 | Temp 97.8°F | Resp 16 | Ht 62.0 in | Wt 178.0 lb

## 2016-03-30 VITALS — BP 115/71 | HR 86 | Temp 98.4°F | Resp 18 | Ht 62.0 in | Wt 177.8 lb

## 2016-03-30 DIAGNOSIS — Z79899 Other long term (current) drug therapy: Secondary | ICD-10-CM

## 2016-03-30 DIAGNOSIS — B029 Zoster without complications: Secondary | ICD-10-CM | POA: Diagnosis not present

## 2016-03-30 DIAGNOSIS — J189 Pneumonia, unspecified organism: Secondary | ICD-10-CM

## 2016-03-30 DIAGNOSIS — C50411 Malignant neoplasm of upper-outer quadrant of right female breast: Secondary | ICD-10-CM

## 2016-03-30 DIAGNOSIS — C773 Secondary and unspecified malignant neoplasm of axilla and upper limb lymph nodes: Secondary | ICD-10-CM

## 2016-03-30 NOTE — Progress Notes (Signed)
Patient Care Team: Unk Pinto, MD as PCP - General (Internal Medicine) Warden Fillers, MD as Consulting Physician (Ophthalmology) Kathie Rhodes, MD as Consulting Physician (Urology) Danella Sensing, MD as Consulting Physician (Dermatology) Alfonso Patten, RN as Registered Nurse Dorothy Spark, MD as Consulting Physician (Cardiology) Sylvan Cheese, NP as Nurse Practitioner (Hematology and Oncology)  DIAGNOSIS: Breast cancer of upper-outer quadrant of right female breast [facility])   Staging form: Breast, AJCC 7th Edition     Clinical stage from 12/17/2015: Stage IIA (T2, N0, M0) - Unsigned       Staging comments: Staged at breast conference on 3.1.17    SUMMARY OF ONCOLOGIC HISTORY:   Breast cancer of upper-outer quadrant of right female breast ([place])   12/05/2015 Mammogram Right breast mammogram and ultrasound revealed 2.8 x 2.7 x 2.1 irregular hypoechoic mass at 10:00 position, few mildly prominent right extrarenal lymph nodes are identified but unchanged since 2008   12/10/2015 Initial Diagnosis Right breast biopsy 10:00 position: 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 Differential diagnosis complete replacement of lymph node versus direct extension   01/19/2016 - 01/22/2016 Hospital Admission Hospitalization for abdominal pain, diarrhea, nausea and vomiting   02/21/2016 - 02/24/2016 Hospital Admission Hospitalization for herpes zoster and sepsis due to cellulitis   03/05/2016 Breast MRI Decrease in the right breast malignancy from  3.2 cm to 2.9 cm, decreasing the lymph node from 2.1 cm to 0.9 cm   03/22/2016 - 03/26/2016 Hospital Admission Hospitalization for suspicion for wound infection from the shingles    CHIEF COMPLIANT: Follow-up after recent hospitalization  INTERVAL HISTORY: [name] is a 60 year old with above-mentioned history of right breast cancer treated with neoadjuvant chemotherapy with dose dense Adriamycin Cytoxan for 4 cycles and she could not continue to treat any further because of multiple problems including hospitalization for zoster, sepsis, wound infection, abdominal pain and diarrhea. We decided to discontinue further chemotherapy. She is here after the most recent hospitalization and discharge on 03/26/2016. She had pneumonia and was treated with antibiotics. Fevers have subsided and she is feeling better and breathing better.  REVIEW OF SYSTEMS:   Constitutional: Denies fevers, chills or abnormal weight loss, complains of fatigue Eyes: Denies blurriness of vision Ears, nose, mouth, throat, and face: Denies mucositis or sore throat Respiratory: Denies cough, dyspnea or wheezes Cardiovascular: Denies palpitation, chest discomfort Gastrointestinal:  Denies nausea, heartburn or change in bowel habits Skin: Eczema Lymphatics: Denies new lymphadenopathy or easy bruising Neurological:Denies numbness, tingling or new weaknesses Behavioral/Psych: Mood is stable, no new changes  Extremities: No lower extremity edema  All other systems were reviewed with the patient and are negative.  I have reviewed the past medical history, past surgical history, social history and family history with the patient and they are unchanged from previous note.  ALLERGIES:  is allergic to penicillins.  MEDICATIONS:  Current Outpatient Prescriptions  Medication Sig Dispense Refill  . acetaminophen (TYLENOL) 650 MG CR tablet Take 1,300 mg by mouth every 8 (eight) hours as needed for pain.    Marland Kitchen  atenolol (TENORMIN)  100 MG tablet Take 100 mg by mouth daily.    Marland Kitchen atorvastatin (LIPITOR) 80 MG tablet take 1 tablet by mouth once daily 30 tablet 3  . cholecalciferol (VITAMIN D) 1000 UNITS tablet Take 4,000 Units by mouth daily.    . diazepam (VALIUM) 5 MG tablet Take 5 mg by mouth every morning.     . diltiazem (CARDIZEM CD) 120 MG 24 hr capsule Take 1 capsule (120 mg total) by mouth daily. 90 capsule 3  . ferrous sulfate 325 (65 FE) MG tablet Take 325 mg by mouth daily with breakfast.    . gabapentin (NEURONTIN) 100 MG capsule Take 2 capsules (200 mg total) by mouth 3 (three) times daily. 90 capsule 1  . levofloxacin (LEVAQUIN) 500 MG tablet Take 1 tablet (500 mg total) by mouth daily. 4 tablet 0  . linezolid (ZYVOX) 600 MG tablet Take 1 tablet (600 mg total) by mouth 2 (two) times daily. 6 tablet 0  . LORazepam (ATIVAN) 0.5 MG tablet Take 0.5 mg by mouth every 6 (six) hours as needed. anxiety    . montelukast (SINGULAIR) 10 MG tablet Take 1 tablet (10 mg total) by mouth daily. 30 tablet 2  . morphine (MS CONTIN) 15 MG 12 hr tablet Take 1 tablet (15 mg total) by mouth every 12 (twelve) hours as needed for pain. 10 tablet 0  . ondansetron (ZOFRAN) 4 MG tablet Take 1 tablet (4 mg total) by mouth every 6 (six) hours. 20 tablet 0  . oxyCODONE-acetaminophen (PERCOCET/ROXICET) 5-325 MG tablet Take 2 tablets by mouth every 8 (eight) hours as needed for severe pain. 10 tablet 0  . PAZEO 0.7 % SOLN Place 1 drop into both eyes daily as needed (allergies).     . potassium chloride SA (K-DUR,KLOR-CON) 20 MEQ tablet Take 1 tablet (20 mEq total) by mouth 2 (two) times daily. 60 tablet 6  . prochlorperazine (COMPAZINE) 10 MG tablet Take 1 tablet (10 mg total) by mouth every 6 (six) hours as needed (Nausea or vomiting). 30 tablet 1  . promethazine (PHENERGAN) 12.5 MG tablet Take 2 tablets (25 mg total) by mouth every 6 (six) hours as needed for nausea or vomiting. 20 tablet 00  . rivaroxaban (XARELTO) 20 MG TABS tablet Take 1  tablet (20 mg total) by mouth daily with supper. 90 tablet 3  . triamcinolone cream (KENALOG) 0.1 % Apply 1 application topically 3 (three) times daily. 80 g 0  . triamterene-hydrochlorothiazide (MAXZIDE-25) 37.5-25 MG tablet Take 1 tablet by mouth daily.    . valACYclovir (VALTREX) 1000 MG tablet Take 1 tablet (1,000 mg total) by mouth 3 (three) times daily. 7 tablet 0   No current facility-administered medications for this visit.    PHYSICAL EXAMINATION: ECOG PERFORMANCE STATUS: 2 - Symptomatic, <50% confined to bed  Filed Vitals:   03/30/16 1505  BP: 115/71  Pulse: 86  Temp: 98.4 F (36.9 C)  Resp: 18   Filed Weights   03/30/16 1505  Weight: 177 lb 12.8 oz (80.65 kg)    GENERAL:alert, no distress and comfortable SKIN: Eczema which has improved with chemotherapy EYES: normal, Conjunctiva are pink and non-injected, sclera clear OROPHARYNX:no exudate, no erythema and lips, buccal mucosa, and tongue normal  NECK: supple, thyroid normal size, non-tender, without nodularity LYMPH:  no palpable lymphadenopathy in the cervical, axillary or inguinal LUNGS: clear to auscultation and percussion with normal breathing effort HEART: regular rate & rhythm and no murmurs and no lower extremity edema ABDOMEN:abdomen  soft, non-tender and normal bowel sounds MUSCULOSKELETAL:no cyanosis of digits and no clubbing  NEURO: alert & oriented x 3 with fluent speech, no focal motor/sensory deficits EXTREMITIES: No lower extremity edema  LABORATORY DATA:  I have reviewed the data as listed   Chemistry      Component Value Date/Time   NA 137 03/28/2016 0900   NA 137 02/26/2016 1232   K 2.7* 03/28/2016 0900   K 3.6 02/26/2016 1232   CL 101 03/28/2016 0900   CO2 26 03/28/2016 0900   CO2 26 02/26/2016 1232   BUN <5* 03/28/2016 0900   BUN 7.7 02/26/2016 1232   CREATININE 0.45 03/28/2016 0900   CREATININE 0.60 03/22/2016 1535   CREATININE 0.7 02/26/2016 1232      Component Value Date/Time    CALCIUM 9.4 03/28/2016 0900   CALCIUM 8.7 02/26/2016 1232   ALKPHOS 71 03/28/2016 0900   ALKPHOS 77 02/26/2016 1232   AST 26 03/28/2016 0900   AST 24 02/26/2016 1232   ALT 13* 03/28/2016 0900   ALT 18 02/26/2016 1232   BILITOT 0.7 03/28/2016 0900   BILITOT <0.30 02/26/2016 1232       Lab Results  Component Value Date   WBC 3.1* 03/28/2016   HGB 10.8* 03/28/2016   HCT 33.1* 03/28/2016   MCV 86.2 03/28/2016   PLT 382 03/28/2016   NEUTROABS 1.1* 03/23/2016     ASSESSMENT & PLAN:  Breast cancer of upper-outer quadrant of right female breast ([place]) Right breast biopsy 12/10/2015: 10:00 position: 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  Breast MRI 03/05/2016: Decrease in the right breast malignancy from 3.2 cm to 2.9 cm, decreasing the lymph node from 2.1 cm to 0.9 cm Hospitalization for wound infection treated with antibiotics.  Plan: 1. Breast conserving surgery with axillary lymph node dissection: I will request Dr. Barry Dienes to see the patient and schedule her for surgery. Her port can be removed as well. 2. Followed by adjuvant radiation therapy  Return to clinic after surgery for follow-up.   No orders of the defined types were placed in this encounter.   The patient has a good understanding of the overall plan. she agrees with it. she will call with any problems that may develop before the next visit here.   Rulon Eisenmenger,  MD 03/30/2016

## 2016-03-30 NOTE — Progress Notes (Signed)
Assessment and Plan: [phone] (all) [phone] (if 7 days high complexity)  hospital [place] [id number]:  HCAP, abx finished.  Clinically appears improved.  Schedule CXR in 3-4 weeks to ensure clearance.  Herpes zoster outbreak- finish valtrex, consider stopping gabapentin  Polypharmacy-discussed proper use of pain medication and also the use of her antinausea medication.  Most importantly spoke about proper use of pain medication that she is likely not going to be without some form of pain or discomfort but that she should not be completely pain free as she is over medicated at this time.  I did send a message to Dr. Lindi Adie about this and he is also going to speak with her.     Over 40 minutes of exam, counseling, chart review, and complex, high level critical decision making was performed this visit.   HPI 60 y.o.female presents for follow up from the hospital. Admit date to the hospital was 03/28/16, patient was discharged from the hospital on 03/28/16 and our office contacted the office the day after discharge to set up a follow up appointment, patient was admitted for: HCAP and also for failure to thrive with multiple falls.  She was felt to be over medicated by the ED provider and was taken off doxepin and morphine was cut back to once daily.  She is still taking oxycodone.  She reports that she didn't know that she was taking so much pain medication.  She went back to the ER on 03/28/16 for nausea, vomiting, and diarrhea.  Part of this was felt to be from the antibiotics being used for HCAP and part was felt to be due to the fact the patient was taking pain medication on an empty stomach and also not taking her antinausea medication correctly.    Images while in the hospital: No results found.  Past Medical History  Diagnosis Date  . MRSA (methicillin resistant staph aureus) culture positive 2009     none since per patient  . Eczema   . Hypercholesteremia   . Hypertension   . Kidney  stones   . Anemia   . Vitamin D deficiency   . Paroxysmal atrial fibrillation ([place]) 10/2011, 06/2015  . GERD (gastroesophageal reflux disease)   . Atrial fibrillation ([place]) 2016  . Dysrhythmia     A FIB - followed by Dr. Meda Coffee  . Breast cancer of upper-outer quadrant of right female breast ([place]) 12/12/2015  . Breast cancer ([place])   . Complication of anesthesia 10-06-15     slow to awaken after   . PONV (postoperative nausea and vomiting) 10-06-15    admitted back to hospital for dehydration  . Family history of breast cancer   . Family history of prostate cancer      Allergies  Allergen Reactions  . Penicillins Other (See Comments)    Has patient had a PCN reaction causing immediate rash, facial/tongue/throat swelling, SOB or lightheadedness with hypotension: NO Has patient had a PCN reaction causing severe rash involving mucus membranes or skin necrosis: NO Has patient had a PCN reaction that required hospitalization NO Has patient had a PCN reaction occurring within the last 10 years: NO If all of the above answers are "NO", then may proceed with Cephalosporin use.      Current Outpatient Prescriptions on File Prior to Visit  Medication Sig Dispense Refill  . acetaminophen (TYLENOL) 650 MG CR tablet Take 1,300 mg by mouth every 8 (eight) hours as needed for pain.    Marland Kitchen  atenolol (TENORMIN) 100 MG tablet Take 100 mg by mouth daily.    Marland Kitchen atorvastatin (LIPITOR) 80 MG tablet take 1 tablet by mouth once daily 30 tablet 3  . cholecalciferol (VITAMIN D) 1000 UNITS tablet Take 4,000 Units by mouth daily.    . diazepam (VALIUM) 5 MG tablet Take 5 mg by mouth every morning.     . diltiazem (CARDIZEM CD) 120 MG 24 hr capsule Take 1 capsule (120 mg total) by mouth daily. 90 capsule 3  . ferrous sulfate 325 (65 FE) MG tablet Take 325 mg by mouth daily with breakfast.    . gabapentin (NEURONTIN) 100 MG capsule Take 2 capsules (200 mg total) by mouth 3 (three) times daily. 90 capsule 1  .  levofloxacin (LEVAQUIN) 500 MG tablet Take 1 tablet (500 mg total) by mouth daily. 4 tablet 0  . linezolid (ZYVOX) 600 MG tablet Take 1 tablet (600 mg total) by mouth 2 (two) times daily. 6 tablet 0  . LORazepam (ATIVAN) 0.5 MG tablet Take 0.5 mg by mouth every 6 (six) hours as needed. anxiety    . montelukast (SINGULAIR) 10 MG tablet Take 1 tablet (10 mg total) by mouth daily. 30 tablet 2  . morphine (MS CONTIN) 15 MG 12 hr tablet Take 1 tablet (15 mg total) by mouth every 12 (twelve) hours as needed for pain. 10 tablet 0  . ondansetron (ZOFRAN) 4 MG tablet Take 1 tablet (4 mg total) by mouth every 6 (six) hours. 20 tablet 0  . oxyCODONE-acetaminophen (PERCOCET/ROXICET) 5-325 MG tablet Take 2 tablets by mouth every 8 (eight) hours as needed for severe pain. 10 tablet 0  . PAZEO 0.7 % SOLN Place 1 drop into both eyes daily as needed (allergies).     . potassium chloride SA (K-DUR,KLOR-CON) 20 MEQ tablet Take 1 tablet (20 mEq total) by mouth 2 (two) times daily. 60 tablet 6  . prochlorperazine (COMPAZINE) 10 MG tablet Take 1 tablet (10 mg total) by mouth every 6 (six) hours as needed (Nausea or vomiting). 30 tablet 1  . promethazine (PHENERGAN) 12.5 MG tablet Take 2 tablets (25 mg total) by mouth every 6 (six) hours as needed for nausea or vomiting. 20 tablet 00  . rivaroxaban (XARELTO) 20 MG TABS tablet Take 1 tablet (20 mg total) by mouth daily with supper. 90 tablet 3  . triamcinolone cream (KENALOG) 0.1 % Apply 1 application topically 3 (three) times daily. 80 g 0  . triamterene-hydrochlorothiazide (MAXZIDE-25) 37.5-25 MG tablet Take 1 tablet by mouth daily.    . valACYclovir (VALTREX) 1000 MG tablet Take 1 tablet (1,000 mg total) by mouth 3 (three) times daily. 7 tablet 0   No current facility-administered medications on file prior to visit.    ROS: Rash, fatigue.  Denies cough, shortness of breath, dizziness, chest pain, wheezing, sputum production.   Physical Exam: Filed Weights    03/30/16 1041  Weight: 178 lb (80.74 kg)   BP 108/62 mmHg  Pulse 86  Temp(Src) 97.8 F (36.6 C) (Temporal)  Resp 16  Ht 5\' 2"  (1.575 m)  Wt 178 lb (80.74 kg)  BMI 32.55 kg/m2 General Appearance: Well nourished, in no apparent distress. Eyes: PERRLA, EOMs, conjunctiva no swelling or erythema Sinuses: No Frontal/maxillary tenderness ENT/Mouth: Ext aud canals clear, TMs without erythema, bulging. No erythema, swelling, or exudate on post pharynx.  Tonsils not swollen or erythematous. Hearing normal.  Neck: Supple, thyroid normal.  Respiratory: Respiratory effort normal, BS equal bilaterally without rales, rhonchi, wheezing  or stridor.  Cardio: RR with no MRGs. Brisk peripheral pulses without edema.  Abdomen: Soft, + BS.  Non tender, no guarding, rebound, hernias, masses. Lymphatics: Non tender without lymphadenopathy.  Musculoskeletal: Full ROM, 5/5 strength, normal gait.  Skin: Large 5 cm x 4 cm ulceration to the left thoracic spine with clean and dry dressings in place.  Granulation tissue can be seen around the edges of the wound.  2 cm x 1 cm ulcer to the lateral left breast visible with granulation tissue present.  Dressing in place is warm and dry.   Neuro: Cranial nerves intact. Normal muscle tone, no cerebellar symptoms. Sensation intact.  Psych: Awake and oriented X 3, normal affect, Insight and Judgment appropriate.     Starlyn Skeans, PA-C 11:06 AM [facility]

## 2016-03-30 NOTE — Assessment & Plan Note (Signed)
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  Breast MRI 03/05/2016: Decrease in the right breast malignancy from 3.2 cm to 2.9 cm, decreasing the lymph node from 2.1 cm to 0.9 cm Hospitalization for wound infection treated with antibiotics.  Plan: 1. Breast conserving surgery with axillary lymph node dissection 2. Followed by adjuvant radiation therapy  Return to clinic after surgery for follow-up.

## 2016-03-31 ENCOUNTER — Encounter: Payer: Self-pay | Admitting: Internal Medicine

## 2016-04-02 ENCOUNTER — Other Ambulatory Visit: Payer: Self-pay | Admitting: General Surgery

## 2016-04-02 DIAGNOSIS — C50411 Malignant neoplasm of upper-outer quadrant of right female breast: Secondary | ICD-10-CM

## 2016-04-05 ENCOUNTER — Encounter: Payer: Self-pay | Admitting: Internal Medicine

## 2016-04-06 ENCOUNTER — Encounter: Payer: Self-pay | Admitting: Internal Medicine

## 2016-04-07 ENCOUNTER — Ambulatory Visit: Payer: BC Managed Care – PPO | Admitting: Hematology and Oncology

## 2016-04-07 ENCOUNTER — Encounter: Payer: Self-pay | Admitting: Internal Medicine

## 2016-04-07 ENCOUNTER — Other Ambulatory Visit: Payer: Self-pay | Admitting: Internal Medicine

## 2016-04-07 MED ORDER — PROMETHAZINE HCL 25 MG RE SUPP: 25.0000 mg | Freq: Four times a day (QID) | RECTAL | Status: DC | PRN

## 2016-04-08 ENCOUNTER — Observation Stay (HOSPITAL_COMMUNITY)
Admission: EM | Admit: 2016-04-08 | Discharge: 2016-04-10 | Disposition: A | Discharge status: Home / Self Care | Payer: BC Managed Care – PPO | Attending: Internal Medicine | Admitting: Internal Medicine

## 2016-04-08 ENCOUNTER — Telehealth: Payer: Self-pay | Admitting: *Deleted

## 2016-04-08 ENCOUNTER — Emergency Department (HOSPITAL_COMMUNITY): Payer: BC Managed Care – PPO

## 2016-04-08 ENCOUNTER — Other Ambulatory Visit: Payer: Self-pay

## 2016-04-08 ENCOUNTER — Encounter (HOSPITAL_COMMUNITY): Payer: Self-pay

## 2016-04-08 DIAGNOSIS — R1110 Vomiting, unspecified: Secondary | ICD-10-CM | POA: Diagnosis not present

## 2016-04-08 DIAGNOSIS — F419 Anxiety disorder, unspecified: Secondary | ICD-10-CM | POA: Diagnosis not present

## 2016-04-08 DIAGNOSIS — Z79899 Other long term (current) drug therapy: Secondary | ICD-10-CM | POA: Diagnosis not present

## 2016-04-08 DIAGNOSIS — B3731 Acute candidiasis of vulva and vagina: Secondary | ICD-10-CM | POA: Diagnosis present

## 2016-04-08 DIAGNOSIS — R1030 Lower abdominal pain, unspecified: Secondary | ICD-10-CM | POA: Diagnosis not present

## 2016-04-08 DIAGNOSIS — B029 Zoster without complications: Secondary | ICD-10-CM | POA: Diagnosis present

## 2016-04-08 DIAGNOSIS — Z7901 Long term (current) use of anticoagulants: Secondary | ICD-10-CM | POA: Insufficient documentation

## 2016-04-08 DIAGNOSIS — N3000 Acute cystitis without hematuria: Secondary | ICD-10-CM

## 2016-04-08 DIAGNOSIS — C50411 Malignant neoplasm of upper-outer quadrant of right female breast: Secondary | ICD-10-CM | POA: Diagnosis not present

## 2016-04-08 DIAGNOSIS — R7989 Other specified abnormal findings of blood chemistry: Secondary | ICD-10-CM

## 2016-04-08 DIAGNOSIS — N390 Urinary tract infection, site not specified: Secondary | ICD-10-CM | POA: Diagnosis not present

## 2016-04-08 DIAGNOSIS — B373 Candidiasis of vulva and vagina: Secondary | ICD-10-CM | POA: Diagnosis not present

## 2016-04-08 DIAGNOSIS — L98429 Non-pressure chronic ulcer of back with unspecified severity: Secondary | ICD-10-CM | POA: Insufficient documentation

## 2016-04-08 DIAGNOSIS — E785 Hyperlipidemia, unspecified: Secondary | ICD-10-CM | POA: Insufficient documentation

## 2016-04-08 DIAGNOSIS — R112 Nausea with vomiting, unspecified: Principal | ICD-10-CM | POA: Insufficient documentation

## 2016-04-08 DIAGNOSIS — R778 Other specified abnormalities of plasma proteins: Secondary | ICD-10-CM | POA: Diagnosis not present

## 2016-04-08 DIAGNOSIS — D638 Anemia in other chronic diseases classified elsewhere: Secondary | ICD-10-CM | POA: Insufficient documentation

## 2016-04-08 DIAGNOSIS — C779 Secondary and unspecified malignant neoplasm of lymph node, unspecified: Secondary | ICD-10-CM | POA: Diagnosis not present

## 2016-04-08 DIAGNOSIS — D649 Anemia, unspecified: Secondary | ICD-10-CM

## 2016-04-08 DIAGNOSIS — I480 Paroxysmal atrial fibrillation: Secondary | ICD-10-CM | POA: Insufficient documentation

## 2016-04-08 DIAGNOSIS — I10 Essential (primary) hypertension: Secondary | ICD-10-CM | POA: Diagnosis not present

## 2016-04-08 DIAGNOSIS — L98499 Non-pressure chronic ulcer of skin of other sites with unspecified severity: Secondary | ICD-10-CM

## 2016-04-08 DIAGNOSIS — Z9221 Personal history of antineoplastic chemotherapy: Secondary | ICD-10-CM | POA: Diagnosis not present

## 2016-04-08 LAB — LIPASE, BLOOD: Lipase: 21 U/L (ref 11–51)

## 2016-04-08 LAB — CBC
HEMATOCRIT: 26.6 % — AB (ref 36.0–46.0)
HEMOGLOBIN: 8.5 g/dL — AB (ref 12.0–15.0)
MCH: 28.6 pg (ref 26.0–34.0)
MCHC: 32 g/dL (ref 30.0–36.0)
MCV: 89.6 fL (ref 78.0–100.0)
Platelets: 443 10*3/uL — ABNORMAL HIGH (ref 150–400)
RBC: 2.97 MIL/uL — ABNORMAL LOW (ref 3.87–5.11)
RDW: 20.1 % — ABNORMAL HIGH (ref 11.5–15.5)
WBC: 8.6 10*3/uL (ref 4.0–10.5)

## 2016-04-08 LAB — COMPREHENSIVE METABOLIC PANEL
ALBUMIN: 3.4 g/dL — AB (ref 3.5–5.0)
ALK PHOS: 74 U/L (ref 38–126)
ALT: 16 U/L (ref 14–54)
ANION GAP: 8 (ref 5–15)
AST: 38 U/L (ref 15–41)
BUN: <5 mg/dL — ABNORMAL LOW (ref 6–20)
CALCIUM: 10 mg/dL (ref 8.9–10.3)
CO2: 27 mmol/L (ref 22–32)
Chloride: 99 mmol/L — ABNORMAL LOW (ref 101–111)
Creatinine, Ser: 0.59 mg/dL (ref 0.44–1.00)
GFR calc Af Amer: >60 mL/min (ref >60)
GFR calc non Af Amer: >60 mL/min (ref >60)
GLUCOSE: 104 mg/dL — AB (ref 65–99)
POTASSIUM: 3.6 mmol/L (ref 3.5–5.1)
SODIUM: 134 mmol/L — AB (ref 135–145)
Total Bilirubin: 0.9 mg/dL (ref 0.3–1.2)
Total Protein: 6.9 g/dL (ref 6.5–8.1)

## 2016-04-08 LAB — I-STAT TROPONIN, ED: Troponin i, poc: 0.1 ng/mL (ref 0.00–0.08)

## 2016-04-08 MED ORDER — OXYCODONE-ACETAMINOPHEN 5-325 MG PO TABS: 2.0000 | ORAL_TABLET | Freq: Three times a day (TID) | ORAL | Status: DC | PRN

## 2016-04-08 MED ORDER — DILTIAZEM HCL ER COATED BEADS 120 MG PO CP24
120.0000 mg | ORAL_CAPSULE | Freq: Every day | ORAL | Status: DC
  Administered 2016-04-09 – 2016-04-10 (×2): 120 mg via ORAL
  Filled 2016-04-08 (×2): qty 1

## 2016-04-08 MED ORDER — LORAZEPAM 0.5 MG PO TABS: 0.5000 mg | ORAL_TABLET | Freq: Four times a day (QID) | ORAL | Status: DC | PRN

## 2016-04-08 MED ORDER — POTASSIUM CHLORIDE CRYS ER 20 MEQ PO TBCR
20.0000 meq | EXTENDED_RELEASE_TABLET | Freq: Two times a day (BID) | ORAL | Status: DC
  Administered 2016-04-09 – 2016-04-10 (×4): 20 meq via ORAL
  Filled 2016-04-08 (×4): qty 1

## 2016-04-08 MED ORDER — OLOPATADINE HCL 0.1 % OP SOLN
1.0000 [drp] | Freq: Every day | OPHTHALMIC | Status: DC | PRN
  Filled 2016-04-08: qty 5

## 2016-04-08 MED ORDER — ATENOLOL 50 MG PO TABS
100.0000 mg | ORAL_TABLET | Freq: Every day | ORAL | Status: DC
  Administered 2016-04-09 – 2016-04-10 (×2): 100 mg via ORAL
  Filled 2016-04-08 (×2): qty 2

## 2016-04-08 MED ORDER — ONDANSETRON HCL 4 MG/2ML IJ SOLN
4.0000 mg | Freq: Once | INTRAMUSCULAR | Status: AC
  Administered 2016-04-08: 4 mg via INTRAVENOUS
  Filled 2016-04-08: qty 2

## 2016-04-08 MED ORDER — ATORVASTATIN CALCIUM 40 MG PO TABS
80.0000 mg | ORAL_TABLET | Freq: Every day | ORAL | Status: DC
  Administered 2016-04-09: 80 mg via ORAL
  Filled 2016-04-08: qty 2

## 2016-04-08 MED ORDER — SODIUM CHLORIDE 0.9 % IV BOLUS (SEPSIS)
1000.0000 mL | Freq: Once | INTRAVENOUS | Status: AC
  Administered 2016-04-08: 1000 mL via INTRAVENOUS

## 2016-04-08 MED ORDER — SODIUM CHLORIDE 0.9 % IV SOLN
INTRAVENOUS | Status: DC
  Administered 2016-04-09 (×2): via INTRAVENOUS
  Administered 2016-04-09: 100 mL/h via INTRAVENOUS

## 2016-04-08 MED ORDER — ALBUTEROL SULFATE (2.5 MG/3ML) 0.083% IN NEBU: 2.5000 mg | INHALATION_SOLUTION | RESPIRATORY_TRACT | Status: DC | PRN

## 2016-04-08 MED ORDER — ONDANSETRON HCL 4 MG/2ML IJ SOLN: 4.0000 mg | Freq: Four times a day (QID) | INTRAMUSCULAR | Status: DC | PRN

## 2016-04-08 MED ORDER — DIAZEPAM 5 MG PO TABS
5.0000 mg | ORAL_TABLET | Freq: Every morning | ORAL | Status: DC
  Administered 2016-04-09 – 2016-04-10 (×2): 5 mg via ORAL
  Filled 2016-04-08 (×2): qty 1

## 2016-04-08 MED ORDER — VITAMIN D 1000 UNITS PO TABS
4000.0000 [IU] | ORAL_TABLET | Freq: Every day | ORAL | Status: DC
Start: 2016-04-09 — End: 2016-04-10
  Administered 2016-04-09 – 2016-04-10 (×2): 4000 [IU] via ORAL
  Filled 2016-04-08 (×2): qty 4

## 2016-04-08 MED ORDER — ONDANSETRON HCL 4 MG PO TABS: 4.0000 mg | ORAL_TABLET | Freq: Four times a day (QID) | ORAL | Status: DC | PRN

## 2016-04-08 MED ORDER — PROCHLORPERAZINE EDISYLATE 5 MG/ML IJ SOLN: 10.0000 mg | INTRAMUSCULAR | Status: DC | PRN

## 2016-04-08 MED ORDER — RIVAROXABAN 20 MG PO TABS
20.0000 mg | ORAL_TABLET | Freq: Every day | ORAL | Status: DC
  Administered 2016-04-09: 20 mg via ORAL
  Filled 2016-04-08: qty 1

## 2016-04-08 MED ORDER — GABAPENTIN 100 MG PO CAPS
100.0000 mg | ORAL_CAPSULE | Freq: Three times a day (TID) | ORAL | Status: DC
  Administered 2016-04-09 – 2016-04-10 (×5): 100 mg via ORAL
  Filled 2016-04-08 (×5): qty 1

## 2016-04-08 MED ORDER — MORPHINE SULFATE ER 15 MG PO TBCR
15.0000 mg | EXTENDED_RELEASE_TABLET | Freq: Two times a day (BID) | ORAL | Status: DC
  Administered 2016-04-09 – 2016-04-10 (×4): 15 mg via ORAL
  Filled 2016-04-08 (×4): qty 1

## 2016-04-08 MED ORDER — FERROUS SULFATE 325 (65 FE) MG PO TABS
325.0000 mg | ORAL_TABLET | Freq: Every day | ORAL | Status: DC
  Administered 2016-04-09 – 2016-04-10 (×2): 325 mg via ORAL
  Filled 2016-04-08 (×2): qty 1

## 2016-04-08 MED ORDER — TRIAMTERENE-HCTZ 37.5-25 MG PO TABS
1.0000 | ORAL_TABLET | Freq: Every day | ORAL | Status: DC
  Administered 2016-04-09 – 2016-04-10 (×2): 1 via ORAL
  Filled 2016-04-08 (×2): qty 1

## 2016-04-08 MED ORDER — SODIUM CHLORIDE 0.9% FLUSH: 3.0000 mL | Freq: Two times a day (BID) | INTRAVENOUS | Status: DC

## 2016-04-08 NOTE — H&P (Signed)
History and Physical    CAMPBELL AGRAMONTE ERD:[id number] DOB: [birth year] DOA: 04/08/2016  Referring MD/NP/PA: Dr. Alvino Chapel PCP: Alesia Richards, MD  Patient coming from: home   Chief Complaint:  Nausea and vomiting  HPI: [name] is a 60 y.o. 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 Symptoms started all of a sudden and she was unable to keep any food or liquids down. Patient reports trying oral Zofran along with Ensure and Gatorade without relief of symptoms. Emesis was usually whenever she just tried to eat or drink.  Associated symptoms include generalized weakness, fatigue, and lower abdominal pain. Describes the lower abdominal pain as achy in nature and does not radiate anywhere. Denies having any hematemesis, blood in stool, dysuria, discharge, diarrhea, chest pain, or palpitations. She is followed by Dr. Lindi Adie of oncology and notes that her last chemotherapy session have been sometime in 01/2016 due to weakness and subsequent complications. Patient had two hospitalizations and multiple office visit over the last 2 months for sepsis secondary to herpes zoster( hospitalized 5/6-5/9), weakness, dysuria, and then healthcare associated pneumonia(hospitalized 6/5-6/9).  She notes that she is scheduled to see her oncologist and the surgeon 04/16/2016 for consultation for the breast conserving surgery with axillary lymph node dissection has an appointment scheduled for July 10  for the surgery.   ED Course: Upon admission to the emergency department patient was evaluated and seen to have a temperature up to 99.55F, and all other vitals within normal limits. Lab work revealed WBC 8.6, hemoglobin 8.5, platelets 443, sodium 134, potassium 3.6, chloride 99, CO2 27, BUN<5 creatinine 0.59, calcium 10, glucose 104. Chest x-ray showed  improved aeration of the right lung since prior study had persistent asymmetric density in the right upper lobe.  Review of Systems: As per HPI otherwise 10 point review of systems negative.   Past Medical History  Diagnosis Date  . MRSA (methicillin resistant staph aureus) culture positive 2009     none since per patient  . Eczema   . Hypercholesteremia   . Hypertension   . Kidney stones   . Anemia   . Vitamin D deficiency   . Paroxysmal atrial fibrillation ([place]) 10/2011, 06/2015  . GERD (gastroesophageal reflux disease)   . Atrial fibrillation ([place]) 2016  . Dysrhythmia     A FIB - followed by Dr. Meda Coffee  . Breast cancer of upper-outer quadrant of right female breast ([place]) 12/12/2015  . Breast cancer ([place])   . Complication of anesthesia 10-06-15     slow to awaken after   . PONV (postoperative nausea and vomiting) 10-06-15    admitted back to hospital for dehydration  . Family history of breast cancer   . Family history of prostate cancer     Past Surgical History  Procedure Laterality Date  . Breast surgery Left     biopsy-neg  . Kidney stone surgery  2013  . Nephrolithotomy Right 10/06/2015    Procedure: NEPHROLITHOTOMY PERCUTANEOUS;  Surgeon: Kathie Rhodes, MD;  Location: [facility];  Service: Urology;  Laterality: Right;  . Cystoscopy with holmium laser lithotripsy Right 10/06/2015    Procedure: CYSTOSCOPY WITH HOLMIUM LASER LITHOTRIPSY;  Surgeon: Kathie Rhodes, MD;  Location: [facility];  Service: Urology;  Laterality: Right;  . Portacath placement Left 12/23/2015    Procedure: INSERTION PORT-A-CATH WITH [place];  Surgeon: Stark Klein, MD;  Location: [facility];  Service: General;  Laterality: Left;     reports that she has never smoked. She has never used smokeless tobacco. She reports that she does not drink alcohol or use illicit drugs.  Allergies  Allergen Reactions  . Penicillins Other (See Comments)    Has patient had a PCN reaction causing immediate rash, facial/tongue/throat  swelling, SOB or lightheadedness with hypotension: NO Has patient had a PCN reaction causing severe rash involving mucus membranes or skin necrosis: NO Has patient had a PCN reaction that required hospitalization NO Has patient had a PCN reaction occurring within the last 10 years: NO If all of the above answers are "NO", then may proceed with Cephalosporin use.    Family History  Problem Relation Age of Onset  . Hypertension Mother   . Hepatitis C Mother   . Heart disease Father   . Dementia Father   . Aneurysm Brother   . Breast cancer Paternal Aunt     dx in her 50s  . Prostate cancer Paternal Uncle     42s  . Prostate cancer Paternal Uncle     dx in his 57s  . Leukemia Paternal Aunt   . Breast cancer Cousin     mother's paternal first cousin  . Thyroid cancer Cousin     mother's paternal first cousin    Prior to Admission medications   Medication Sig Start Date End Date Taking? Authorizing Provider  acetaminophen (TYLENOL) 650 MG CR tablet Take 1,300 mg by mouth every 8 (eight) hours as needed for pain.   Yes Historical Provider, MD  atenolol (TENORMIN) 100 MG tablet Take 100 mg by mouth daily.   Yes Historical Provider, MD  atorvastatin (LIPITOR) 80 MG tablet take 1 tablet by mouth once daily 03/10/16  Yes Unk Pinto, MD  cholecalciferol (VITAMIN D) 1000 UNITS tablet Take 4,000 Units by mouth daily.   Yes Historical Provider, MD  diazepam (VALIUM) 5 MG tablet Take 5 mg by mouth every morning.    Yes Historical Provider, MD  diltiazem (CARDIZEM CD) 120 MG 24 hr capsule Take 1 capsule (120 mg total) by mouth daily. 10/24/15  Yes Dorothy Spark, MD  ferrous sulfate 325 (65 FE) MG tablet Take 325 mg by mouth daily with breakfast.   Yes Historical Provider, MD  gabapentin (NEURONTIN) 100 MG capsule Take 100 mg by mouth 3 (three) times daily. 03/16/16  Yes Historical Provider, MD  LORazepam (ATIVAN) 0.5 MG tablet Take 0.5 mg by mouth every 6 (six) hours as needed. anxiety  02/11/16  Yes Historical Provider, MD  morphine (MS CONTIN) 15 MG 12 hr tablet Take 1 tablet (15 mg total) by mouth every 12 (twelve) hours as needed for pain. 03/28/16  Yes Virgel Manifold, MD  ondansetron (ZOFRAN) 4 MG tablet Take 1 tablet (4 mg total) by mouth every 6 (six) hours. 03/28/16  Yes Virgel Manifold, MD  ondansetron (ZOFRAN) 8 MG tablet Take 8 mg by mouth 2 (two) times daily. 03/28/16  Yes Historical Provider, MD  oxyCODONE-acetaminophen (PERCOCET/ROXICET) 5-325 MG tablet Take 2 tablets by mouth every 8 (eight) hours as needed for severe pain. 03/26/16  Yes Kelvin Cellar, MD  PAZEO 0.7 % SOLN Place 1 drop into both eyes daily as needed (allergies).  12/15/15  Yes Historical Provider, MD  potassium chloride SA (K-DUR,KLOR-CON) 20 MEQ tablet Take 1 tablet (20 mEq total) by mouth 2 (two) times daily. 03/29/16  Yes Dorothy Spark, MD  prochlorperazine (COMPAZINE) 10 MG  tablet Take 1 tablet (10 mg total) by mouth every 6 (six) hours as needed (Nausea or vomiting). 02/11/16  Yes Nicholas Lose, MD  promethazine (PHENERGAN) 12.5 MG tablet Take 2 tablets (25 mg total) by mouth every 6 (six) hours as needed for nausea or vomiting. 03/28/16  Yes Virgel Manifold, MD  rivaroxaban (XARELTO) 20 MG TABS tablet Take 1 tablet (20 mg total) by mouth daily with supper. 01/23/16  Yes Theodis Blaze, MD  triamterene-hydrochlorothiazide (MAXZIDE-25) 37.5-25 MG tablet Take 1 tablet by mouth daily. 02/28/16  Yes Historical Provider, MD  levofloxacin (LEVAQUIN) 500 MG tablet Take 1 tablet (500 mg total) by mouth daily. Patient not taking: Reported on 04/08/2016 03/26/16   Kelvin Cellar, MD  linezolid (ZYVOX) 600 MG tablet Take 1 tablet (600 mg total) by mouth 2 (two) times daily. Patient not taking: Reported on 04/08/2016 03/26/16   Kelvin Cellar, MD  montelukast (SINGULAIR) 10 MG tablet Take 1 tablet (10 mg total) by mouth daily. Patient not taking: Reported on 04/08/2016 12/26/15 12/25/16  Loma Sousa Forcucci, PA-C  promethazine  (PHENERGAN) 25 MG suppository Place 1 suppository (25 mg total) rectally every 6 (six) hours as needed for nausea. Patient not taking: Reported on 04/08/2016 04/07/16 04/07/17  Starlyn Skeans, PA-C  triamcinolone cream (KENALOG) 0.1 % Apply 1 application topically 3 (three) times daily. Patient not taking: Reported on 04/08/2016 03/10/16   Nicholas Lose, MD  valACYclovir (VALTREX) 1000 MG tablet Take 1 tablet (1,000 mg total) by mouth 3 (three) times daily. Patient not taking: Reported on 04/08/2016 03/26/16   Kelvin Cellar, MD    Physical Exam: Filed Vitals:   04/08/16 1704 04/08/16 2023  BP: 150/86 159/91  Pulse: 79 82  Temp: 99.1 F (37.3 C)   TempSrc: Oral   Resp: 18 19  SpO2: 100% 100%      Constitutional: NAD, calm, comfortable Filed Vitals:   04/08/16 1704 04/08/16 2023  BP: 150/86 159/91  Pulse: 79 82  Temp: 99.1 F (37.3 C)   TempSrc: Oral   Resp: 18 19  SpO2: 100% 100%   Eyes: PERRL, lids and conjunctivae normal ENMT: Mucous membranes are moist. Posterior pharynx clear of any exudate or lesions.Normal dentition.  Neck: normal, supple, no masses, no thyromegaly Respiratory: clear to auscultation bilaterally, no wheezing, no crackles. Normal respiratory effort. No accessory muscle use.  Cardiovascular: Regular rate and rhythm, no murmurs / rubs / gallops. No extremity edema. 2+ pedal pulses. No carotid bruits.  Abdomen:  Tenderness to palpation of the lower abdominal quadrants, no masses palpated. No hepatosplenomegaly. Bowel sounds positive.  Musculoskeletal: no clubbing / cyanosis. No joint deformity upper and lower extremities. Good ROM, no contractures. Normal muscle tone.  Skin: Warm, large open wound of the Left upper back measures at least 7 x 4 in a dermatomal distribution, and rash of the inguinal region. Neurologic: CN 2-12 grossly intact. Sensation intact, DTR normal. Strength 5/5 in all 4.  Psychiatric: Normal judgment and insight. Alert and oriented x 3.  Normal mood.     Labs on Admission: I have personally reviewed following labs and imaging studies  CBC:  Recent Labs Lab 04/08/16 2055  WBC 8.6  HGB 8.5*  HCT 26.6*  MCV 89.6  PLT 458*   Basic Metabolic Panel:  Recent Labs Lab 04/08/16 2055  NA 134*  K 3.6  CL 99*  CO2 27  GLUCOSE 104*  BUN <5*  CREATININE 0.59  CALCIUM 10.0   GFR: Estimated Creatinine Clearance: 72.6 mL/min (by C-G  formula based on Cr of 0.59). Liver Function Tests:  Recent Labs Lab 04/08/16 2055  AST 38  ALT 16  ALKPHOS 74  BILITOT 0.9  PROT 6.9  ALBUMIN 3.4*    Recent Labs Lab 04/08/16 2055  LIPASE 21   No results for input(s): AMMONIA in the last 168 hours. Coagulation Profile: No results for input(s): INR, PROTIME in the last 168 hours. Cardiac Enzymes: No results for input(s): CKTOTAL, CKMB, CKMBINDEX, TROPONINI in the last 168 hours. BNP (last 3 results) No results for input(s): PROBNP in the last 8760 hours. HbA1C: No results for input(s): HGBA1C in the last 72 hours. CBG: No results for input(s): GLUCAP in the last 168 hours. Lipid Profile: No results for input(s): CHOL, HDL, LDLCALC, TRIG, CHOLHDL, LDLDIRECT in the last 72 hours. Thyroid Function Tests: No results for input(s): TSH, T4TOTAL, FREET4, T3FREE, THYROIDAB in the last 72 hours. Anemia Panel: No results for input(s): VITAMINB12, FOLATE, FERRITIN, TIBC, IRON, RETICCTPCT in the last 72 hours. Urine analysis:    Component Value Date/Time   COLORURINE YELLOW 03/28/2016 1043   APPEARANCEUR HAZY* 03/28/2016 1043   LABSPEC 1.015 03/28/2016 1043   PHURINE 6.5 03/28/2016 1043   GLUCOSEU NEGATIVE 03/28/2016 1043   HGBUR SMALL* 03/28/2016 1043   BILIRUBINUR NEGATIVE 03/28/2016 1043   KETONESUR 40* 03/28/2016 1043   PROTEINUR NEGATIVE 03/28/2016 1043   UROBILINOGEN 0.2 08/03/2015 0725   NITRITE NEGATIVE 03/28/2016 1043   LEUKOCYTESUR SMALL* 03/28/2016 1043   Sepsis Labs: No results found for this or any  previous visit (from the past 240 hour(s)).   Radiological Exams on Admission: Dg Chest 2 View  04/08/2016  CLINICAL DATA:  Pt c/o L chest pain and emesis x 4 days and abdominal pain since April. Pain score 3/10. Hx of R breast CA and Pt has not had any treatment since April. cough x 1 month - recent hospitalization for PNA EXAM: CHEST  2 VIEW COMPARISON:  03/24/2016 FINDINGS: Heart size is upper normal. There is persistent streaky density in the right mid lung zone, slightly improved over previous exam. Left lung is clear. Left-sided power port tip overlies the level of the superior vena cava. No pulmonary edema. Visualized osseous structures have a normal appearance. IMPRESSION: 1. Improved aeration in the right lung since prior study. 2. There is persistent asymmetric density in the right upper lobe, warranting follow-up until clear. Electronically Signed   By: Nolon Nations M.D.   On: 04/08/2016 18:29    EKG: Independently reviewed.Appears to be in sinus rhythm with nonspecific T-wave abnormalities.  Assessment/Plan Nausea and vomiting: Acute. Patient with intractable nausea and vomiting. - Admit to a telemetry bed - Zofran/Compazine IV prn - IV fluids of normal saline at 100 ml/ hour  - Advance diet as tolerated - Family wanted to know about non oral options for antinausea medications   Lower abdominal pain/ suspected urinary tract infection: Patient with suprapubic tenderness and physical exam. Urinalysis was pending. - Follow-up urinalysis - Will treat if signs of UTI with Rocephin which the patient has tolerated previously in the past  Vaginal candidiasis: Expected - Diflucan IV 1 dose - Nystatin powder to apply to the external areas affected  Herpes zoster with skin ulceration: Previously treated with a seven-day course. As patient is immunocompromised and has cancer questionable continued suppression therapy needed. - Contact isolation - Wound care consult - Low air loss  mattress replacement - Continue gabapentin - Question oncology in a.m. in regards of need of the patient  to continue on valacyclovir as the wound considered to be open   Elevated troponin: Acute. Initial troponin 0.1. Patient complains mostly of epigastric pain. - Trend cardiac troponins  - Repeat EKG in a.m. - Will consult cardiology if needed   Paroxysmal atrial fibrillation on chronic anticoagulation therapy: chadsvasc score 3 - Continue diltiazem, atenolol, and Xarelto  Essential hypertension - Continue above medications and Maxide when tolerating oral medication  Right-sided breast cancer: Since last chemotherapy session was in 01/2016. Scheduled to have possible breast conservative surgery in July to remove the cancer. - Notify Dr. Lindi Adie  in a.m. that the patient is in the hospital - Continue pain regimen of oxycodone, morphine sulfate  Anxiety -Continue Ativan prn and Valium   Anemia of chronic disease: Patient's baseline creatinine appears to be around 8. - Continue ferrous sulfate when able  Hyperlipidemia - Continue atorvastatin    DVT prophylaxis: Xarelto  Code Status: Full  Family Communication: Discussed plan with patient's daughter present at bedside Disposition Plan: Possible discharge home in 1-2 days when tolerating oral intake Consults called: None Admission status: Telemetry observation  Norval Morton MD [facility] Pager [phone]  If 7PM-7AM, please contact night-coverage [email or web address] Password [email or web address]  04/08/2016, 10:45 PM    \

## 2016-04-08 NOTE — ED Provider Notes (Addendum)
CSN: [id number]     Arrival date & time 04/08/16  1638 History   First MD Initiated Contact with Patient 04/08/16 2021     Chief Complaint  Patient presents with  . CA Pt   . Chest Pain  . Emesis      Patient is a 60 y.o. female presenting with chest pain and vomiting. The history is provided by the patient.  Chest Pain Associated symptoms: abdominal pain, fatigue, shortness of breath and vomiting   Associated symptoms: no back pain, no numbness and no weakness   Emesis Associated symptoms: abdominal pain   Patient presents with nausea and vomiting for the last few days. States she has had some chills. Slight dull chest pain also. Also some abdominal pain. Pain is in her upper abdomen. Chest pain is dull. States she feels pretty worn out. 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 She also had recent zoster on her back..  Past Medical History  Diagnosis Date  . MRSA (methicillin resistant staph aureus) culture positive 2009     none since per patient  . Eczema   . Hypercholesteremia   . Hypertension   . Kidney stones   . Anemia   . Vitamin D deficiency   . Paroxysmal atrial fibrillation ([place]) 10/2011, 06/2015  . GERD (gastroesophageal reflux disease)   . Atrial fibrillation ([place]) 2016  . Dysrhythmia     A FIB - followed by Dr. Meda Coffee  . Breast cancer of upper-outer quadrant of right female breast ([place]) 12/12/2015  . Breast cancer ([place])   . Complication of anesthesia 10-06-15     slow to awaken after   . PONV (postoperative nausea and vomiting) 10-06-15    admitted back to hospital for dehydration  . Family history of breast cancer   . Family history of prostate cancer    Past Surgical History  Procedure Laterality Date  . Breast surgery Left     biopsy-neg  . Kidney stone surgery  2013  . Nephrolithotomy Right 10/06/2015    Procedure: NEPHROLITHOTOMY  PERCUTANEOUS;  Surgeon: Kathie Rhodes, MD;  Location: [facility];  Service: Urology;  Laterality: Right;  . Cystoscopy with holmium laser lithotripsy Right 10/06/2015    Procedure: CYSTOSCOPY WITH HOLMIUM LASER LITHOTRIPSY;  Surgeon: Kathie Rhodes, MD;  Location: [facility];  Service: Urology;  Laterality: Right;  . Portacath placement Left 12/23/2015    Procedure: INSERTION PORT-A-CATH WITH [place];  Surgeon: Stark Klein, MD;  Location: [facility];  Service: General;  Laterality: Left;   Family History  Problem Relation Age of Onset  . Hypertension Mother   . Hepatitis C Mother   . Heart disease Father   . Dementia Father   . Aneurysm Brother   . Breast cancer Paternal Aunt     dx in her 56s  . Prostate cancer Paternal Uncle     6s  . Prostate cancer Paternal Uncle     dx in his 3s  . Leukemia Paternal Aunt   . Breast cancer Cousin     mother's paternal first cousin  . Thyroid cancer Cousin     mother's paternal first cousin   Social History  Substance Use Topics  . Smoking status: Never Smoker   . Smokeless tobacco: Never Used  . Alcohol Use: No   OB History    No data available     Review  of Systems  Constitutional: Positive for appetite change and fatigue.  HENT: Negative for congestion.   Eyes: Negative for photophobia.  Respiratory: Positive for shortness of breath.   Cardiovascular: Positive for chest pain.  Gastrointestinal: Positive for vomiting and abdominal pain.  Genitourinary: Negative for flank pain.  Musculoskeletal: Negative for back pain.  Skin: Positive for rash.  Neurological: Negative for weakness and numbness.  Hematological: Does not bruise/bleed easily.  Psychiatric/Behavioral: Negative for dysphoric mood.      Allergies  Penicillins  Home Medications   Prior to Admission medications   Medication Sig Start Date End Date Taking? Authorizing Provider  acetaminophen (TYLENOL) 650 MG CR tablet Take 1,300 mg by mouth every 8 (eight) hours as needed for pain.    Yes Historical Provider, MD  atenolol (TENORMIN) 100 MG tablet Take 100 mg by mouth daily.   Yes Historical Provider, MD  atorvastatin (LIPITOR) 80 MG tablet take 1 tablet by mouth once daily 03/10/16  Yes Unk Pinto, MD  cholecalciferol (VITAMIN D) 1000 UNITS tablet Take 4,000 Units by mouth daily.   Yes Historical Provider, MD  diazepam (VALIUM) 5 MG tablet Take 5 mg by mouth every morning.    Yes Historical Provider, MD  diltiazem (CARDIZEM CD) 120 MG 24 hr capsule Take 1 capsule (120 mg total) by mouth daily. 10/24/15  Yes Dorothy Spark, MD  ferrous sulfate 325 (65 FE) MG tablet Take 325 mg by mouth daily with breakfast.   Yes Historical Provider, MD  gabapentin (NEURONTIN) 100 MG capsule Take 100 mg by mouth 3 (three) times daily. 03/16/16  Yes Historical Provider, MD  LORazepam (ATIVAN) 0.5 MG tablet Take 0.5 mg by mouth every 6 (six) hours as needed. anxiety 02/11/16  Yes Historical Provider, MD  morphine (MS CONTIN) 15 MG 12 hr tablet Take 1 tablet (15 mg total) by mouth every 12 (twelve) hours as needed for pain. 03/28/16  Yes Virgel Manifold, MD  ondansetron (ZOFRAN) 4 MG tablet Take 1 tablet (4 mg total) by mouth every 6 (six) hours. 03/28/16  Yes Virgel Manifold, MD  ondansetron (ZOFRAN) 8 MG tablet Take 8 mg by mouth 2 (two) times daily. 03/28/16  Yes Historical Provider, MD  oxyCODONE-acetaminophen (PERCOCET/ROXICET) 5-325 MG tablet Take 2 tablets by mouth every 8 (eight) hours as needed for severe pain. 03/26/16  Yes Kelvin Cellar, MD  PAZEO 0.7 % SOLN Place 1 drop into both eyes daily as needed (allergies).  12/15/15  Yes Historical Provider, MD  potassium chloride SA (K-DUR,KLOR-CON) 20 MEQ tablet Take 1 tablet (20 mEq total) by mouth 2 (two) times daily. 03/29/16  Yes Dorothy Spark, MD  prochlorperazine (COMPAZINE) 10 MG tablet Take 1 tablet (10 mg total) by mouth every 6 (six) hours as needed (Nausea or vomiting). 02/11/16  Yes Nicholas Lose, MD  promethazine (PHENERGAN) 12.5 MG  tablet Take 2 tablets (25 mg total) by mouth every 6 (six) hours as needed for nausea or vomiting. 03/28/16  Yes Virgel Manifold, MD  rivaroxaban (XARELTO) 20 MG TABS tablet Take 1 tablet (20 mg total) by mouth daily with supper. 01/23/16  Yes Theodis Blaze, MD  triamterene-hydrochlorothiazide (MAXZIDE-25) 37.5-25 MG tablet Take 1 tablet by mouth daily. 02/28/16  Yes Historical Provider, MD  levofloxacin (LEVAQUIN) 500 MG tablet Take 1 tablet (500 mg total) by mouth daily. Patient not taking: Reported on 04/08/2016 03/26/16   Kelvin Cellar, MD  linezolid (ZYVOX) 600 MG tablet Take 1 tablet (600 mg total) by mouth 2 (two) times  daily. Patient not taking: Reported on 04/08/2016 03/26/16   Kelvin Cellar, MD  montelukast (SINGULAIR) 10 MG tablet Take 1 tablet (10 mg total) by mouth daily. Patient not taking: Reported on 04/08/2016 12/26/15 12/25/16  Loma Sousa Forcucci, PA-C  promethazine (PHENERGAN) 25 MG suppository Place 1 suppository (25 mg total) rectally every 6 (six) hours as needed for nausea. Patient not taking: Reported on 04/08/2016 04/07/16 04/07/17  Starlyn Skeans, PA-C  triamcinolone cream (KENALOG) 0.1 % Apply 1 application topically 3 (three) times daily. Patient not taking: Reported on 04/08/2016 03/10/16   Nicholas Lose, MD  valACYclovir (VALTREX) 1000 MG tablet Take 1 tablet (1,000 mg total) by mouth 3 (three) times daily. Patient not taking: Reported on 04/08/2016 03/26/16   Kelvin Cellar, MD   BP 142/75 mmHg  Pulse 86  Temp(Src) 99.1 F (37.3 C) (Oral)  Resp 26  SpO2 96% Physical Exam  Constitutional: She appears well-developed.  HENT:  Head: Atraumatic.  Neck: Neck supple.  Cardiovascular: Normal rate.   Pulmonary/Chest: Effort normal. She has no wheezes. She has no rales.  Abdominal: Soft.  Mild epigastric tenderness without rebound or guarding.  Musculoskeletal: Normal range of motion.  Neurological: She is alert.  Skin: Skin is warm.  No rash at site of pain.    ED Course   Procedures (including critical care time) Labs Review Labs Reviewed  COMPREHENSIVE METABOLIC PANEL - Abnormal; Notable for the following:    Sodium 134 (*)    Chloride 99 (*)    Glucose, Bld 104 (*)    BUN <5 (*)    Albumin 3.4 (*)    All other components within normal limits  CBC - Abnormal; Notable for the following:    RBC 2.97 (*)    Hemoglobin 8.5 (*)    HCT 26.6 (*)    RDW 20.1 (*)    Platelets 443 (*)    All other components within normal limits  URINALYSIS, ROUTINE W REFLEX MICROSCOPIC (NOT AT [place]) - Abnormal; Notable for the following:    APPearance CLOUDY (*)    Hgb urine dipstick LARGE (*)    Ketones, ur 15 (*)    Protein, ur 30 (*)    Leukocytes, UA MODERATE (*)    All other components within normal limits  URINE MICROSCOPIC-ADD ON - Abnormal; Notable for the following:    Squamous Epithelial / LPF 0-5 (*)    All other components within normal limits  I-STAT TROPOININ, ED - Abnormal; Notable for the following:    Troponin i, poc 0.10 (*)    All other components within normal limits  LIPASE, BLOOD  TROPONIN I  TROPONIN I  TROPONIN I  BASIC METABOLIC PANEL  CBC    Imaging Review Dg Chest 2 View  04/08/2016  CLINICAL DATA:  Pt c/o L chest pain and emesis x 4 days and abdominal pain since April. Pain score 3/10. Hx of R breast CA and Pt has not had any treatment since April. cough x 1 month - recent hospitalization for PNA EXAM: CHEST  2 VIEW COMPARISON:  03/24/2016 FINDINGS: Heart size is upper normal. There is persistent streaky density in the right mid lung zone, slightly improved over previous exam. Left lung is clear. Left-sided power port tip overlies the level of the superior vena cava. No pulmonary edema. Visualized osseous structures have a normal appearance. IMPRESSION: 1. Improved aeration in the right lung since prior study. 2. There is persistent asymmetric density in the right upper lobe, warranting follow-up until clear.  Electronically Signed   By:  Nolon Nations M.D.   On: 04/08/2016 18:29   I have personally reviewed and evaluated these images and lab results as part of my medical decision-making.   EKG Interpretation   Date/Time:  Thursday April 08 2016 17:10:03 EDT Ventricular Rate:  78 PR Interval:    QRS Duration: 77 QT Interval:  382 QTC Calculation: 436 R Axis:   41 Text Interpretation:  Sinus rhythm Nonspecific T abnormalities, anterior  leads Confirmed by Alvino Chapel  MD, Ovid Curd [phone]) on 04/08/2016 8:23:13 PM      MDM   Final diagnoses:  Non-intractable vomiting with nausea, vomiting of unspecified type  Elevated troponin  Anemia, unspecified anemia type  Breast cancer of upper-outer quadrant of right female breast [facility])  Patient with nausea vomiting. Continued feeling bad. Troponin mildly elevated but EKG reassuring. Has infiltrating x-ray but is improved to stable from before. No fever here. Has known breast cancer. Will admit to internal medicine.    Davonna Belling, MD 04/09/16 [id number]  Davonna Belling, MD 05/05/16 1630

## 2016-04-08 NOTE — ED Notes (Signed)
Pt c/o L chest pain and emesis x 4 days and abdominal pain since April.  Pain score 3/10.  Hx of R breast CA and Pt has not had any treatment since April.

## 2016-04-08 NOTE — ED Notes (Signed)
Lab delay -  Pt would like port accessed.

## 2016-04-08 NOTE — Telephone Encounter (Signed)
TC from patient's daughter, [name]. She states her mother has had vomiting for 2-3 days, is unable to keep any food down. Tries to drink fluids but is not able to keep fluids down either. Denies fever, chills, diarrhea.  Patient has told her daughter she is feeling very weak.  Pt has not had any chemo since April as she could not tolerate it (had hospitalizations for sepsis etc).  Pt is see Dr. Barry Dienes and have surgery, then radiation.  Advised angela that she should take her mother to the [facility] for evaluation. It's not likely to be  related to chemo therapy as it has been so long since last treatment. We would be unable to see her at this late hour in the [facility]. Levada Dy agreed and will get her mother to the ED.

## 2016-04-09 DIAGNOSIS — B3731 Acute candidiasis of vulva and vagina: Secondary | ICD-10-CM | POA: Diagnosis present

## 2016-04-09 DIAGNOSIS — N390 Urinary tract infection, site not specified: Secondary | ICD-10-CM | POA: Diagnosis present

## 2016-04-09 DIAGNOSIS — R7989 Other specified abnormal findings of blood chemistry: Secondary | ICD-10-CM

## 2016-04-09 DIAGNOSIS — R1030 Lower abdominal pain, unspecified: Secondary | ICD-10-CM | POA: Diagnosis present

## 2016-04-09 DIAGNOSIS — C50411 Malignant neoplasm of upper-outer quadrant of right female breast: Secondary | ICD-10-CM | POA: Diagnosis not present

## 2016-04-09 DIAGNOSIS — R1110 Vomiting, unspecified: Secondary | ICD-10-CM | POA: Diagnosis not present

## 2016-04-09 DIAGNOSIS — R778 Other specified abnormalities of plasma proteins: Secondary | ICD-10-CM

## 2016-04-09 DIAGNOSIS — L98499 Non-pressure chronic ulcer of skin of other sites with unspecified severity: Secondary | ICD-10-CM | POA: Diagnosis present

## 2016-04-09 DIAGNOSIS — B373 Candidiasis of vulva and vagina: Secondary | ICD-10-CM | POA: Diagnosis present

## 2016-04-09 LAB — BASIC METABOLIC PANEL
Anion gap: 6 (ref 5–15)
BUN: <5 mg/dL — ABNORMAL LOW (ref 6–20)
CALCIUM: 9.2 mg/dL (ref 8.9–10.3)
CO2: 26 mmol/L (ref 22–32)
CREATININE: 0.58 mg/dL (ref 0.44–1.00)
Chloride: 105 mmol/L (ref 101–111)
GFR calc non Af Amer: >60 mL/min (ref >60)
Glucose, Bld: 83 mg/dL (ref 65–99)
Potassium: 3.6 mmol/L (ref 3.5–5.1)
SODIUM: 137 mmol/L (ref 135–145)

## 2016-04-09 LAB — CBC
HCT: 32.2 % — ABNORMAL LOW (ref 36.0–46.0)
Hemoglobin: 10.2 g/dL — ABNORMAL LOW (ref 12.0–15.0)
MCH: 28.4 pg (ref 26.0–34.0)
MCHC: 31.7 g/dL (ref 30.0–36.0)
MCV: 89.7 fL (ref 78.0–100.0)
PLATELETS: 279 10*3/uL (ref 150–400)
RBC: 3.59 MIL/uL — AB (ref 3.87–5.11)
RDW: 20.1 % — AB (ref 11.5–15.5)
WBC: 6.1 10*3/uL (ref 4.0–10.5)

## 2016-04-09 LAB — URINE MICROSCOPIC-ADD ON: Bacteria, UA: NONE SEEN

## 2016-04-09 LAB — URINALYSIS, ROUTINE W REFLEX MICROSCOPIC
BILIRUBIN URINE: NEGATIVE
GLUCOSE, UA: NEGATIVE mg/dL
Ketones, ur: 15 mg/dL — AB
Nitrite: NEGATIVE
Protein, ur: 30 mg/dL — AB
SPECIFIC GRAVITY, URINE: 1.01 (ref 1.005–1.030)
pH: 6.5 (ref 5.0–8.0)

## 2016-04-09 LAB — TROPONIN I
TROPONIN I: 0.14 ng/mL — AB (ref <0.031)
Troponin I: 0.16 ng/mL — ABNORMAL HIGH (ref <0.031)

## 2016-04-09 MED ORDER — ENSURE ENLIVE PO LIQD
237.0000 mL | ORAL | Status: DC
  Administered 2016-04-09: 237 mL via ORAL

## 2016-04-09 MED ORDER — BOOST / RESOURCE BREEZE PO LIQD
1.0000 | ORAL | Status: DC
  Administered 2016-04-09: 1 via ORAL

## 2016-04-09 MED ORDER — FLUCONAZOLE IN SODIUM CHLORIDE 200-0.9 MG/100ML-% IV SOLN
200.0000 mg | Freq: Once | INTRAVENOUS | Status: AC
  Administered 2016-04-09: 200 mg via INTRAVENOUS
  Filled 2016-04-09: qty 100

## 2016-04-09 MED ORDER — SODIUM CHLORIDE 0.9% FLUSH
10.0000 mL | INTRAVENOUS | Status: DC | PRN
  Administered 2016-04-09 – 2016-04-10 (×3): 10 mL
  Filled 2016-04-09 (×2): qty 40

## 2016-04-09 MED ORDER — NYSTATIN 100000 UNIT/GM EX POWD
Freq: Two times a day (BID) | CUTANEOUS | Status: DC
  Administered 2016-04-09: 1 via TOPICAL
  Administered 2016-04-09 – 2016-04-10 (×3): via TOPICAL
  Filled 2016-04-09: qty 15

## 2016-04-09 MED ORDER — DEXTROSE 5 % IV SOLN
1.0000 g | Freq: Once | INTRAVENOUS | Status: AC
  Administered 2016-04-09: 1 g via INTRAVENOUS
  Filled 2016-04-09: qty 10

## 2016-04-09 MED ORDER — ONDANSETRON 4 MG PO TBDP
4.0000 mg | ORAL_TABLET | ORAL | Status: AC
  Administered 2016-04-09 (×3): 4 mg via ORAL
  Filled 2016-04-09 (×3): qty 1

## 2016-04-09 MED ORDER — DEXTROSE 5 % IV SOLN: 1.0000 g | INTRAVENOUS | Status: DC

## 2016-04-09 NOTE — Progress Notes (Signed)
Initial Nutrition Assessment  DOCUMENTATION CODES:   Non-severe (moderate) malnutrition in context of acute illness/injury, Obesity unspecified  INTERVENTION:  - Will order Ensure Enlive once/day, this supplement provides 350 kcal and 20 grams of protein - Will order Boost Breeze once/day, this supplement provides 250 kcal and 9 grams of protein - Encourage PO intakes of meals and supplements - RD will follow-up 6/26 if pt does not d/c before that time.   NUTRITION DIAGNOSIS:   Inadequate oral intake related to acute illness, nausea, vomiting as evidenced by per patient/family report.  GOAL:   Patient will meet greater than or equal to 90% of their needs  MONITOR:   PO intake, Supplement acceptance, Weight trends, Labs, I & O's  REASON FOR ASSESSMENT:   Consult Assessment of nutrition requirement/status (Calorie Count)  ASSESSMENT:   60 y.o. 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 Symptoms started all of a sudden and she was unable to keep any food or liquids down. Patient reports trying oral Zofran along with Ensure and Gatorade without relief of symptoms. Emesis was usually whenever she just tried to eat or drink. Associated symptoms include generalized weakness, fatigue, and lower abdominal pain. Describes the lower abdominal pain as achy in nature and does not radiate anywhere. Denies having any hematemesis, blood in stool, dysuria, discharge, diarrhea, chest pain, or palpitations. She is followed by Dr. Lindi Adie of oncology and notes that her last chemotherapy session have been sometime in 01/2016 due to weakness and subsequent complications. Patient had two hospitalizations and multiple office visit over the last 2 months for sepsis secondary to herpes zoster( hospitalized 5/6-5/9), weakness, dysuria, and then healthcare  associated pneumonia(hospitalized 6/5-6/9). She notes that she is scheduled to see her oncologist and the surgeon 04/16/2016 for consultation for the breast conserving surgery with axillary lymph node dissection has an appointment scheduled for July 10 for the surgery.   Pt seen for consult and initiation of Calorie Count. BMI indicates obesity. Pt states she did not eat breakfast, she had chicken broth, orange juice, and cranberry juice for lunch and denies abdominal pain or N/V following this meal, and states she ordered chicken salad for dinner tonight.  Spoke with pt and daughter, who is at bedside. Pt with ongoing abdominal pain and N/V since April 2017 with symptoms worsening over time. Pt reports that N/V began after 4th dose of chemo and that despite d/c of chemo symptoms persisted. She states that N/V occurred with all foods and liquids for at least a few days PTA and that she would have a nauseous feeling prior to vomiting. At home, she had tried Ensure, Gatorade, and PO Zofran to ease nausea without relief. Pt states that symptoms seem to have resolved today and last episode was after intake of jello last night. Pt states she has also experienced taste alteration (bland taste).  Talked with pt and daughter about incorporating more flavorful foods and adding herbs and spices to items to add more flavor once N/V and abdominal pain symptoms have been resolved. Encouraged adequate hydration.  Physical assessment indicates no muscle or fat wasting at this time. Weight trends outlined below. Pt reports UBW of 180 lbs and that she last weighed this in April. This would indicate 14 lb weight loss (7.8% body weight) in 2 months which is significant for time frame. Pt meets criteria for malnutrition  based on weight loss and <75% needed intakes for >/= 7 days PTA due to N/V with PO intakes.    04/09/16    166 lb 10.7 oz (75.6 kg)  03/30/16    177 lb 12.8 oz (80.65 kg)  03/30/16    178 lb (80.74 kg)   03/29/16    175 lb 8 oz (79.606 kg)  03/23/16    166 lb 14.4 oz (75.705 kg)  03/22/16    164 lb (74.39 kg)  03/16/16    169 lb (76.658 kg)  03/10/16    171 lb 8 oz (77.792 kg)  Will order supplements as outlined above. Medications reviewed. IVF: NS @ 100 mL/hr. Labs reviewed; BUN <5 mg/dL.   Diet Order:  Diet Heart Room service appropriate?: Yes; Fluid consistency:: Thin  Skin:  Reviewed, no issues  Last BM:  6/22  Height:   Ht Readings from Last 1 Encounters:  04/09/16 '5\' 2"'$  (1.575 m)    Weight:   Wt Readings from Last 1 Encounters:  04/09/16 166 lb 10.7 oz (75.6 kg)    Ideal Body Weight:  50 kg (kg)  BMI:  Body mass index is 30.48 kg/(m^2).  Estimated Nutritional Needs:   Kcal:  2100-2300  Protein:  80-90 grams  Fluid:  >/= 2 L/day  EDUCATION NEEDS:   No education needs identified at this time     Jarome Matin, MS, RD, LDN Inpatient Clinical Dietitian Pager # [phone] After hours/weekend pager # [phone]

## 2016-04-09 NOTE — Progress Notes (Addendum)
PROGRESS NOTE  [name] [id number] DOB: 30-Nov-1955 DOA: 04/08/2016 PCP: Alesia Richards, MD  Brief Narrative: 60 year old woman with breast cancer with plans for breast conserving surgery July 2017 presented with 2 day history nausea and vomiting, lower abdominal pain. Admitted for nausea, vomiting, abdominal pain, possible UTI, vaginal candidiasis, elevated troponin. Symptoms improved fairly rapidly but sister gave history of recurrent nausea vomiting and abdominal pain despite outpatient Zofran. Plan dietitian consultation, IV fluids, 24 hour calorie count. Likely home 6/24.  Assessment/Plan: 1. Nausea, vomiting, lower abdominal pain. Resolved. Acute on chronic, intermittent. Exam benign. No further evaluation suggested at this time. 2. Equivocal urinalysis. Patient completely asymptomatic. Will not treat. 3. Vaginal candidiasis. Treated with Diflucan. 4. Recent herpes zoster with healing full-thickness wound left back, breast dermatome. Wound care consult appreciated, wound slowly improving. 5. Elevated troponin. Troponins flat. EKG nonacute, sinus rhythm, anteroseptal T-wave inversion, seen  03/23/2016. No chest pain. Suspect demand ischemia in the setting of nausea, vomiting and probable dehydration. No further evaluation suggested. 6. Paroxysmal atrial fibrillation, CHA2DS2-VASc 3 on Xarelto 7. Breast cancer of upper-outer quadrant of right female breast. Plan for breast conserving surgery July 2017. 8. Anemia of chronic disease, stable. 9. Discharged 6/9, treated for HCAP, shingles (Valtrex). Chest x-ray showed improvement in infiltrate.   Acute issues improved although poor appetite persist. Sr. relates history of recurrent nausea vomiting and abdominal pain, normally takes Zofran every 8 hours at home.  Plan continued IV fluids today, schedule Zofran every 4 today, 24 hour calorie count and dietitian consultation for further recommendations.  Would anticipate  discharge 6/24 if continues to improve  DVT prophylaxis: SCDs Code Status: full code Family Communication: sister at bedside Disposition Plan: home  Murray Hodgkins, MD  [facility] Direct contact: [phone] --[address]  --[email or web address]; password [email or web address]  7PM-7AM contact night coverage as above 04/09/2016, 9:54 AM    Consultants:    Procedures:    Antimicrobials:    HPI/Subjective: Feels better, nausea, vomiting and abdominal pain have resolved. Poor appetite with minimal food intake.  Sister bedside relates that patient is on constant Zofran at home but despite this has failure to thrive and recurrent vomiting and poor oral intake.   No urinary symptoms, dysuria.  Objective: Filed Vitals:   04/09/16 0132 04/09/16 0132 04/09/16 0153 04/09/16 0514  BP: 157/78  154/78 145/66  Pulse: 90  87 92  Temp:  98.3 F (36.8 C) 99.1 F (37.3 C) 99.3 F (37.4 C)  TempSrc:  Oral Oral Oral  Resp: 24  22 22   Height:   5\' 2"  (1.575 m)   Weight:   75.6 kg (166 lb 10.7 oz)   SpO2: 97%  99% 99%    Intake/Output Summary (Last 24 hours) at 04/09/16 0954 Last data filed at 04/09/16 0700  Gross per 24 hour  Intake 598.33 ml  Output      0 ml  Net 598.33 ml     Filed Weights   04/09/16 0153  Weight: 75.6 kg (166 lb 10.7 oz)    Exam:    Constitutional:  . Appears calm and comfortable ENMT:  . grossly normal hearing  Respiratory:  . CTA bilaterally, no w/r/r.  . Respiratory effort normal. No retractions or accessory muscle use Cardiovascular:  . RRR, no m/r/g . Telemetry SR Abdomen:  . Abdomen appears normal; no tenderness or masses . No hernias noted Psychiatric:  . judgement and insight appear normal . Mental status o Mood, affect appropriate  I have  personally reviewed following labs and imaging studies:  BMP unremarkable  troponins flat, .1 >> .14 >> .16  Hgb up to 10.2  U/A equivocal  Scheduled Meds: . atenolol  100 mg Oral Daily    . atorvastatin  80 mg Oral q1800  . cefTRIAXone (ROCEPHIN)  IV  1 g Intravenous Q24H  . cholecalciferol  4,000 Units Oral Daily  . diazepam  5 mg Oral q morning - 10a  . diltiazem  120 mg Oral Daily  . ferrous sulfate  325 mg Oral Q breakfast  . gabapentin  100 mg Oral TID  . morphine  15 mg Oral Q12H  . nystatin   Topical BID  . potassium chloride SA  20 mEq Oral BID  . rivaroxaban  20 mg Oral Q supper  . sodium chloride flush  3 mL Intravenous Q12H  . triamterene-hydrochlorothiazide  1 tablet Oral Daily   Continuous Infusions: . sodium chloride 100 mL/hr at 04/09/16 0231    Principal Problem:   Nausea & vomiting Active Problems:   Essential hypertension   Breast cancer of upper-outer quadrant of right female breast ([facility])   Herpes zoster   Vaginal candidiasis   Lower abdominal pain   Infection of urinary tract   Skin ulceration ([facility])   Elevated troponin     Time spent 25 minutes

## 2016-04-09 NOTE — Progress Notes (Signed)
PHARMACY NOTE -  Rocephin  Pharmacy has been assisting with dosing of Rocephin for UTI. Dosage remains stable at 1g IV q24 hr and need for further dosage adjustment appears unlikely at present.    Will sign off at this time.  Please reconsult if a change in clinical status warrants re-evaluation of dosage.  Reuel Boom, PharmD, BCPS Pager: [phone] 04/09/2016, 11:38 AM

## 2016-04-09 NOTE — Consult Note (Signed)
WOC wound consult note Reason for Consult:Healing ull thickness wound on left back and breast dermatone, resolving yeast overgrowth in the bilateral inguinal areas. Wound type: herpes zoster with pressure; (viral) and pressure Pressure Ulcer POA: No Measurement:5.5cm x 10cm x 0.2cm at thoracic, pink, moist but with scant serous exudate.  Evidence of wound healing at periphery.  Wound completely healed at left breast. Bilateral inguinal areas with resolving (nearly dry) fungal overgrowth. Wound bed:As described above. Drainage (amount, consistency, odor) As described above Periwound:Intact. Dressing procedure/placement/frequency: Patient and sister reassured that wounds are moving in the right direction, one has healed since my initial assessment on 03/23/16 and the other (thoracic) is dramatically improved. While patient's sister states that "she keeps a yeast rash", the fungal overgrowth at the bilateral inguinal areas is nearly resoplved.  I will send them home with our house (OTC) antifungal powder that contains 2% miconazole (Microguard Powder) that is modestly priced and perfect for home use following discharge.  Patient does not require a mattress replacement this admission; she is more mobile and in considerably less discomfort than previous admission.  I am fortunate today to assess patient with the same nurse with whom I first assessed this patient 3 weeks ago, Wound Treatment Associate (WTA) G. Sison, RN and we agree on dramatic improvement in wound status while simultaneously acknowledging that wound repair and tissue regeneration is slower than the patient would prefer. Comorbid conditions are a factor and patient and her sister understand that. [place] nursing team will not follow, but will remain available to this patient, the nursing and medical teams.  Please re-consult if needed. Thanks, Maudie Flakes, MSN, RN, [place], Arther Abbott  Pager# [phone]

## 2016-04-09 NOTE — Progress Notes (Signed)
Pharmacy Antibiotic Note  [name] is a 60 y.o. female admitted on 04/08/2016 with UTI.  Pharmacy has been consulted for Ceftriaxone dosing.  Plan: Ceftriaxone 1gm iv q24hr    Height: 5\' 2"  (157.5 cm) Weight: 166 lb 10.7 oz (75.6 kg) IBW/kg (Calculated) : 50.1  Temp (24hrs), Avg:98.8 F (37.1 C), Min:98.3 F (36.8 C), Max:99.1 F (37.3 C)   Recent Labs Lab 04/08/16 2055  WBC 8.6  CREATININE 0.59    Estimated Creatinine Clearance: 70.3 mL/min (by C-G formula based on Cr of 0.59).    Allergies  Allergen Reactions  . Penicillins Other (See Comments)    Has patient had a PCN reaction causing immediate rash, facial/tongue/throat swelling, SOB or lightheadedness with hypotension: NO Has patient had a PCN reaction causing severe rash involving mucus membranes or skin necrosis: NO Has patient had a PCN reaction that required hospitalization NO Has patient had a PCN reaction occurring within the last 10 years: NO If all of the above answers are "NO", then may proceed with Cephalosporin use.    Antimicrobials this admission: Ceftriaxone 6/23 >>  Dose adjustments this admission: -  Microbiology results: pending  Thank you for allowing pharmacy to be a part of this patient's care.  Nani Skillern Crowford 04/09/2016 4:10 AM

## 2016-04-10 DIAGNOSIS — C50411 Malignant neoplasm of upper-outer quadrant of right female breast: Secondary | ICD-10-CM

## 2016-04-10 LAB — BASIC METABOLIC PANEL
Anion gap: 6 (ref 5–15)
CALCIUM: 8.8 mg/dL — AB (ref 8.9–10.3)
CO2: 25 mmol/L (ref 22–32)
CREATININE: 0.4 mg/dL — AB (ref 0.44–1.00)
Chloride: 108 mmol/L (ref 101–111)
Glucose, Bld: 83 mg/dL (ref 65–99)
Potassium: 3.7 mmol/L (ref 3.5–5.1)
SODIUM: 139 mmol/L (ref 135–145)

## 2016-04-10 LAB — URINE CULTURE: CULTURE: NO GROWTH

## 2016-04-10 MED ORDER — PROMETHAZINE HCL 12.5 MG PO TABS: 25.0000 mg | ORAL_TABLET | Freq: Four times a day (QID) | ORAL | Status: DC | PRN

## 2016-04-10 MED ORDER — HEPARIN SOD (PORK) LOCK FLUSH 100 UNIT/ML IV SOLN
500.0000 [IU] | INTRAVENOUS | Status: AC | PRN
  Administered 2016-04-10: 500 [IU]

## 2016-04-10 MED ORDER — NYSTATIN 100000 UNIT/GM EX POWD: Freq: Two times a day (BID) | CUTANEOUS | Status: DC

## 2016-04-10 MED ORDER — ONDANSETRON HCL 8 MG PO TABS: 8.0000 mg | ORAL_TABLET | Freq: Three times a day (TID) | ORAL | Status: DC | PRN

## 2016-04-10 NOTE — Care Management Note (Signed)
Case Management Note  Patient Details  Name: [name] MRN: [id number] Date of Birth: [birth year]  Subjective/Objective:          Nausea/vomitting, cancer          Action/Plan: Discharge Planning: AVS reviewed: NCM spoke to pt's Keturah Barre # [phone]. States she did receive hospital bed from [facility] and was pleased with bed. Pt active with [facility] for HH. Contacted [facility] to make aware of scheduled dc home today.   PCP -Unk Pinto MD  Expected Discharge Date:  04/10/2016              Expected Discharge Plan:  [place]  In-House Referral:  NA  Discharge planning Services  CM Consult  Post Acute Care Choice:  Home Health, Resumption of Svcs/PTA Provider Choice offered to:  Adult Children  DME Arranged:  N/A DME Agency:  NA  HH Arranged:  RN, PT, Nurse's Aide [facility] Agency:  [place]  Status of Service:  Completed, signed off  If discussed at [place] of Stay Meetings, dates discussed:    Additional Comments:  Erenest Rasher, RN 04/10/2016, 11:46 AM

## 2016-04-10 NOTE — Discharge Summary (Signed)
[name], is a 60 y.o. female  DOB [birth year]  MRN [id number].  Admission date:  04/08/2016  Admitting Physician  Norval Morton, MD  Discharge Date:  04/10/2016   Primary MD  Alesia Richards, MD  Recommendations for primary care physician for things to follow:   CBC, BMP, two-view chest x-ray in 3-4 days. Close outpatient general surgery and oncology follow-up.   Admission Diagnosis  Elevated troponin [R79.89] Breast cancer of upper-outer quadrant of right female breast (HCC) [C50.411] Anemia, unspecified anemia type [D64.9] Non-intractable vomiting with nausea, vomiting of unspecified type [R11.2]   Discharge Diagnosis  Elevated troponin [R79.89] Breast cancer of upper-outer quadrant of right female breast ([place]) [C50.411] Anemia, unspecified anemia type [D64.9] Non-intractable vomiting with nausea, vomiting of unspecified type [R11.2]    Principal Problem:   Nausea & vomiting Active Problems:   Essential hypertension   Breast cancer of upper-outer quadrant of right female breast (HCC)   Herpes zoster   Vaginal candidiasis   Lower abdominal pain   Infection of urinary tract   Skin ulceration (HCC)   Elevated troponin      Past Medical History  Diagnosis Date  . MRSA (methicillin resistant staph aureus) culture positive 2009     none since per patient  . Eczema   . Hypercholesteremia   . Hypertension   . Kidney stones   . Anemia   . Vitamin D deficiency   . Paroxysmal atrial fibrillation ([place]) 10/2011, 06/2015  . GERD (gastroesophageal reflux disease)   . Atrial fibrillation ([place]) 2016  . Dysrhythmia     A FIB - followed by Dr. Meda Coffee  . Breast cancer of upper-outer quadrant of right female breast ([place]) 12/12/2015  . Breast cancer ([place])   . Complication of anesthesia 10-06-15       slow to awaken after   . PONV (postoperative nausea and vomiting) 10-06-15    admitted back to hospital for dehydration  . Family history of breast cancer   . Family history of prostate cancer     Past Surgical History  Procedure Laterality Date  . Breast surgery Left     biopsy-neg  . Kidney stone surgery  2013  . Nephrolithotomy Right 10/06/2015    Procedure: NEPHROLITHOTOMY PERCUTANEOUS;  Surgeon: Kathie Rhodes, MD;  Location: [facility];  Service: Urology;  Laterality: Right;  . Cystoscopy with holmium laser lithotripsy Right 10/06/2015    Procedure: CYSTOSCOPY WITH HOLMIUM LASER LITHOTRIPSY;  Surgeon: Kathie Rhodes, MD;  Location: [facility];  Service: Urology;  Laterality: Right;  . Portacath placement Left 12/23/2015    Procedure: INSERTION PORT-A-CATH WITH [place];  Surgeon: Stark Klein, MD;  Location: [facility];  Service: General;  Laterality: Left;       HPI  from the history and physical done on the day of admission:    60 year old woman with breast cancer with plans for breast conserving surgery July 2017 presented with 2 day history nausea and vomiting, lower abdominal pain. Admitted for nausea, vomiting, abdominal pain, possible UTI,  vaginal candidiasis, elevated troponin. Symptoms improved fairly rapidly but sister gave history of recurrent nausea vomiting and abdominal pain despite outpatient Zofran. Plan dietitian consultation, IV fluids, 24 hour calorie count.   Hospital Course:   1. Nausea, vomiting, lower abdominal pain. Resolved. Acute on chronic, intermittent. Exam benign. No further evaluation suggested at this time. She is now tolerating diet symptom free and eager to go home, we'll prescribe her antibiotics and let her go home and follow with PCP. 2. Equivocal urinalysis. Patient completely asymptomatic. Will not treat. 3. Vaginal candidiasis. Treated with Diflucan. 4. Recent herpes zoster with healing full-thickness wound left back, breast dermatome. Wound care consult  appreciated, wound slowly improving. No nursing staff ordered. 5. Elevated troponin. Troponins flat. EKG nonacute, sinus rhythm, anteroseptal T-wave inversion, seen 03/23/2016. No chest pain. Suspect demand ischemia in the setting of nausea, vomiting and probable dehydration. No further evaluation suggested. 6. Paroxysmal atrial fibrillation, CHA2DS2-VASc 3 on Xarelto along with diltiazem & beta blocker continue. 7. Breast cancer of upper-outer quadrant of right female breast. Plan for breast conserving surgery July 2017. Follow with primary oncologist Dr. Sonny Dandy and general surgery. 8. Anemia of chronic disease, stable. No transfusions needed. 9. Discharged 6/9, treated for HCAP, shingles (Valtrex). Chest x-ray showed improvement in infiltrate.    Follow UP  Follow-up Information    Follow up with MCKEOWN,WILLIAM DAVID, MD. Schedule an appointment as soon as possible for a visit in 3 days.   Specialty:  Internal Medicine   Contact information:   [address] [place] [address] [phone]       Follow up with Rulon Eisenmenger, MD. Schedule an appointment as soon as possible for a visit in 1 week.   Specialty:  Hematology and Oncology   Contact information:   [place] [address] [phone]        Consults obtained - None Discharge Condition: Guarded  Diet and Activity recommendation: See Discharge Instructions below  Discharge Instructions           Discharge Instructions    Call MD for:  persistant nausea and vomiting    Complete by:  As directed      Diet - low sodium heart healthy    Complete by:  As directed      Discharge instructions    Complete by:  As directed   Follow with Primary MD MCKEOWN,WILLIAM DAVID, MD in 3-4 days   Get CBC, CMP, 2 view Chest X ray checked  by Primary MD next visit.    Activity: As tolerated with Full fall precautions use walker/cane & assistance as needed   Disposition Home    Diet:   Soft, with feeding assistance and aspiration precautions.  For Heart failure patients - Check your Weight same time everyday, if you gain over 2 pounds, or you develop in leg swelling, experience more shortness of breath or chest pain, call your Primary MD immediately. Follow Cardiac Low Salt Diet and 1.5 lit/day fluid restriction.   On your next visit with your primary care physician please Get Medicines reviewed and adjusted.   Please request your Prim.MD to go over all Hospital Tests and Procedure/Radiological results at the follow up, please get all Hospital records sent to your Prim MD by signing hospital release before you go home.   If you experience worsening of your admission symptoms, develop shortness of breath, life threatening emergency, suicidal or homicidal thoughts you must seek medical attention immediately by calling 911  or calling your MD immediately  if symptoms less severe.  You Must read complete instructions/literature along with all the possible adverse reactions/side effects for all the Medicines you take and that have been prescribed to you. Take any new Medicines after you have completely understood and accpet all the possible adverse reactions/side effects.   Do not drive, operate heavy machinery, perform activities at heights, swimming or participation in water activities or provide baby sitting services if your were admitted for syncope or siezures until you have seen by Primary MD or a Neurologist and advised to do so again.  Do not drive when taking Pain medications.    Do not take more than prescribed Pain, Sleep and Anxiety Medications  Special Instructions: If you have smoked or chewed Tobacco  in the last 2 yrs please stop smoking, stop any regular Alcohol  and or any Recreational drug use.  Wear Seat belts while driving.   Please note  You were cared for by a hospitalist during your hospital stay. If you have any questions about your discharge  medications or the care you received while you were in the hospital after you are discharged, you can call the unit and asked to speak with the hospitalist on call if the hospitalist that took care of you is not available. Once you are discharged, your primary care physician will handle any further medical issues. Please note that NO REFILLS for any discharge medications will be authorized once you are discharged, as it is imperative that you return to your primary care physician (or establish a relationship with a primary care physician if you do not have one) for your aftercare needs so that they can reassess your need for medications and monitor your lab values.     Increase activity slowly    Complete by:  As directed              Discharge Medications       Medication List    STOP taking these medications        levofloxacin 500 MG tablet  Commonly known as:  LEVAQUIN     linezolid 600 MG tablet  Commonly known as:  ZYVOX      TAKE these medications        acetaminophen 650 MG CR tablet  Commonly known as:  TYLENOL  Take 1,300 mg by mouth every 8 (eight) hours as needed for pain.     atenolol 100 MG tablet  Commonly known as:  TENORMIN  Take 100 mg by mouth daily.     atorvastatin 80 MG tablet  Commonly known as:  LIPITOR  take 1 tablet by mouth once daily     cholecalciferol 1000 units tablet  Commonly known as:  VITAMIN D  Take 4,000 Units by mouth daily.     diazepam 5 MG tablet  Commonly known as:  VALIUM  Take 5 mg by mouth every morning.     diltiazem 120 MG 24 hr capsule  Commonly known as:  CARDIZEM CD  Take 1 capsule (120 mg total) by mouth daily.     ferrous sulfate 325 (65 FE) MG tablet  Take 325 mg by mouth daily with breakfast.     gabapentin 100 MG capsule  Commonly known as:  NEURONTIN  Take 100 mg by mouth 3 (three) times daily.     LORazepam 0.5 MG tablet  Commonly known as:  ATIVAN  Take 0.5 mg by mouth every 6 (six) hours as needed.  anxiety     montelukast 10 MG tablet  Commonly known as:  SINGULAIR  Take 1 tablet (10 mg total) by mouth daily.     morphine 15 MG 12 hr tablet  Commonly known as:  MS CONTIN  Take 1 tablet (15 mg total) by mouth every 12 (twelve) hours as needed for pain.     nystatin powder  Commonly known as:  MYCOSTATIN/NYSTOP  Apply topically 2 (two) times daily.     ondansetron 8 MG tablet  Commonly known as:  ZOFRAN  Take 1 tablet (8 mg total) by mouth every 8 (eight) hours as needed for nausea or vomiting.     oxyCODONE-acetaminophen 5-325 MG tablet  Commonly known as:  PERCOCET/ROXICET  Take 2 tablets by mouth every 8 (eight) hours as needed for severe pain.     PAZEO 0.7 % Soln  Generic drug:  Olopatadine HCl  Place 1 drop into both eyes daily as needed (allergies).     potassium chloride SA 20 MEQ tablet  Commonly known as:  K-DUR,KLOR-CON  Take 1 tablet (20 mEq total) by mouth 2 (two) times daily.     prochlorperazine 10 MG tablet  Commonly known as:  COMPAZINE  Take 1 tablet (10 mg total) by mouth every 6 (six) hours as needed (Nausea or vomiting).     promethazine 25 MG suppository  Commonly known as:  PHENERGAN  Place 1 suppository (25 mg total) rectally every 6 (six) hours as needed for nausea.     promethazine 12.5 MG tablet  Commonly known as:  PHENERGAN  Take 2 tablets (25 mg total) by mouth every 6 (six) hours as needed for nausea or vomiting.     rivaroxaban 20 MG Tabs tablet  Commonly known as:  XARELTO  Take 1 tablet (20 mg total) by mouth daily with supper.     triamcinolone cream 0.1 %  Commonly known as:  KENALOG  Apply 1 application topically 3 (three) times daily.     triamterene-hydrochlorothiazide 37.5-25 MG tablet  Commonly known as:  MAXZIDE-25  Take 1 tablet by mouth daily.     valACYclovir 1000 MG tablet  Commonly known as:  VALTREX  Take 1 tablet (1,000 mg total) by mouth 3 (three) times daily.        Major procedures and Radiology  Reports - PLEASE review detailed and final reports for all details, in brief -      Dg Chest 1 View  03/24/2016  CLINICAL DATA:  Short of breath EXAM: CHEST 1 VIEW COMPARISON:  03/22/2016 FINDINGS: Pneumonia in the right mid lung zone is stable. Left subclavian Port-A-Cath is stable. Normal heart size. Low lung volumes. No pneumothorax. IMPRESSION: Stable right mid lung pneumonia. Followup PA and lateral chest X-ray is recommended in 3-4 weeks following trial of antibiotic therapy to ensure resolution and exclude underlying malignancy. Electronically Signed   By: Marybelle Killings M.D.   On: 03/24/2016 13:50   Dg Chest 2 View  04/08/2016  CLINICAL DATA:  Pt c/o L chest pain and emesis x 4 days and abdominal pain since April. Pain score 3/10. Hx of R breast CA and Pt has not had any treatment since April. cough x 1 month - recent hospitalization for PNA EXAM: CHEST  2 VIEW COMPARISON:  03/24/2016 FINDINGS: Heart size is upper normal. There is persistent streaky density in the right mid lung zone, slightly improved over previous exam. Left lung is clear. Left-sided power port tip overlies the level of the  superior vena cava. No pulmonary edema. Visualized osseous structures have a normal appearance. IMPRESSION: 1. Improved aeration in the right lung since prior study. 2. There is persistent asymmetric density in the right upper lobe, warranting follow-up until clear. Electronically Signed   By: Nolon Nations M.D.   On: 04/08/2016 18:29   Dg Chest 2 View  03/22/2016  CLINICAL DATA:  [place] tonight.  Weakness. EXAM: CHEST  2 VIEW COMPARISON:  Chest x-ray 02/23/2016 and chest CT 01/30/2016 FINDINGS: The Port-A-Cath is stable. Right perihilar and right upper lobe peribronchial thickening could reflect bronchitis or early bronchopneumonia. Radiation changes are also possible. The left lung is relatively clear. No pleural effusions. Stable mild right-sided pleural thickening. The bony thorax is intact. IMPRESSION:  Right perihilar and right upper lobe peribronchial thickening and increased interstitial markings could reflect early bronchopneumonia or radiation changes. Electronically Signed   By: Marijo Sanes M.D.   On: 03/22/2016 20:35   Ct Head Wo Contrast  03/22/2016  CLINICAL DATA:  [place] 1 week ago and hit head. Generalized weakness. EXAM: CT HEAD WITHOUT CONTRAST TECHNIQUE: Contiguous axial images were obtained from the base of the skull through the vertex without intravenous contrast. COMPARISON:  07/27/2008 FINDINGS: The ventricles are normal in size and configuration. No extra-axial fluid collections are identified. The gray-white differentiation is normal. No CT findings for acute intracranial process such as hemorrhage or infarction. No mass lesions. The brainstem and cerebellum are grossly normal. The bony structures are intact. Hyperostosis frontalis interna noted. The paranasal sinuses and mastoid air cells are clear. The globes are intact. IMPRESSION: No acute intracranial findings or mass lesion. Electronically Signed   By: Marijo Sanes M.D.   On: 03/22/2016 20:52    Micro Results     Recent Results (from the past 240 hour(s))  Culture, Urine     Status: None   Collection Time: 04/08/16 11:30 PM  Result Value Ref Range Status   Specimen Description URINE, CLEAN CATCH  Final   Special Requests Immunocompromised  Final   Culture NO GROWTH Performed at [facility]   Final   Report Status 04/10/2016 FINAL  Final       Today   Subjective    [name] today has no headache,no chest abdominal pain,no new weakness tingling or numbness, feels much better wants to go home today.    Objective   Blood pressure 108/61, pulse 91, temperature 99.4 F (37.4 C), temperature source Oral, resp. rate 22, height 5\' 2"  (1.575 m), weight 75.6 kg (166 lb 10.7 oz), SpO2 100 %.   Intake/Output Summary (Last 24 hours) at 04/10/16 1054 Last data filed at 04/10/16 0648  Gross per 24  hour  Intake   3300 ml  Output    300 ml  Net   3000 ml    Exam Awake Alert, Oriented x 3, No new F.N deficits, Normal affect .AT,PERRAL Supple Neck,No JVD, No cervical lymphadenopathy appriciated.  Symmetrical Chest wall movement, Good air movement bilaterally, CTAB RRR,No Gallops,Rubs or new Murmurs, No Parasternal Heave +ve B.Sounds, Abd Soft, Non tender, No organomegaly appriciated, No rebound -guarding or rigidity. No Cyanosis, Clubbing or edema, No new Rash or bruise   Data Review   CBC w Diff:  Lab Results  Component Value Date   WBC 6.1 04/09/2016   WBC 10.6* 02/26/2016   HGB 10.2* 04/09/2016   HGB 7.9* 02/26/2016   HCT 32.2* 04/09/2016   HCT 24.9* 02/26/2016   PLT 279 04/09/2016  PLT 416* 02/26/2016   LYMPHOPCT 40 03/23/2016   LYMPHOPCT 15.3 02/26/2016   MONOPCT 17 03/23/2016   MONOPCT 17.6* 02/26/2016   EOSPCT 3 03/23/2016   EOSPCT 0.2 02/26/2016   BASOPCT 1 03/23/2016   BASOPCT 1.2 02/26/2016    CMP:  Lab Results  Component Value Date   NA 139 04/10/2016   NA 137 02/26/2016   K 3.7 04/10/2016   K 3.6 02/26/2016   CL 108 04/10/2016   CO2 25 04/10/2016   CO2 26 02/26/2016   BUN <5* 04/10/2016   BUN 7.7 02/26/2016   CREATININE 0.40* 04/10/2016   CREATININE 0.60 03/22/2016   CREATININE 0.7 02/26/2016   PROT 6.9 04/08/2016   PROT 5.2* 02/26/2016   ALBUMIN 3.4* 04/08/2016   ALBUMIN 2.6* 02/26/2016   BILITOT 0.9 04/08/2016   BILITOT <0.30 02/26/2016   ALKPHOS 74 04/08/2016   ALKPHOS 77 02/26/2016   AST 38 04/08/2016   AST 24 02/26/2016   ALT 16 04/08/2016   ALT 18 02/26/2016  .   Total Time in preparing paper work, data evaluation and todays exam - 35 minutes  Thurnell Lose M.D on 04/10/2016 at 10:54 AM  [facility]   Office  [phone]

## 2016-04-10 NOTE — Discharge Instructions (Signed)
Follow with Primary MD MCKEOWN,WILLIAM DAVID, MD in 3-4 days   Get CBC, CMP, 2 view Chest X ray checked  by Primary MD next visit.    Activity: As tolerated with Full fall precautions use walker/cane & assistance as needed   Disposition Home    Diet:  Soft , with feeding assistance and aspiration precautions.  For Heart failure patients - Check your Weight same time everyday, if you gain over 2 pounds, or you develop in leg swelling, experience more shortness of breath or chest pain, call your Primary MD immediately. Follow Cardiac Low Salt Diet and 1.5 lit/day fluid restriction.   On your next visit with your primary care physician please Get Medicines reviewed and adjusted.   Please request your Prim.MD to go over all Hospital Tests and Procedure/Radiological results at the follow up, please get all Hospital records sent to your Prim MD by signing hospital release before you go home.   If you experience worsening of your admission symptoms, develop shortness of breath, life threatening emergency, suicidal or homicidal thoughts you must seek medical attention immediately by calling 911 or calling your MD immediately  if symptoms less severe.  You Must read complete instructions/literature along with all the possible adverse reactions/side effects for all the Medicines you take and that have been prescribed to you. Take any new Medicines after you have completely understood and accpet all the possible adverse reactions/side effects.   Do not drive, operate heavy machinery, perform activities at heights, swimming or participation in water activities or provide baby sitting services if your were admitted for syncope or siezures until you have seen by Primary MD or a Neurologist and advised to do so again.  Do not drive when taking Pain medications.    Do not take more than prescribed Pain, Sleep and Anxiety Medications  Special Instructions: If you have smoked or chewed Tobacco  in the  last 2 yrs please stop smoking, stop any regular Alcohol  and or any Recreational drug use.  Wear Seat belts while driving.   Please note  You were cared for by a hospitalist during your hospital stay. If you have any questions about your discharge medications or the care you received while you were in the hospital after you are discharged, you can call the unit and asked to speak with the hospitalist on call if the hospitalist that took care of you is not available. Once you are discharged, your primary care physician will handle any further medical issues. Please note that NO REFILLS for any discharge medications will be authorized once you are discharged, as it is imperative that you return to your primary care physician (or establish a relationship with a primary care physician if you do not have one) for your aftercare needs so that they can reassess your need for medications and monitor your lab values.

## 2016-04-11 ENCOUNTER — Encounter: Payer: Self-pay | Admitting: Internal Medicine

## 2016-04-12 ENCOUNTER — Other Ambulatory Visit: Payer: Self-pay | Admitting: Internal Medicine

## 2016-04-12 ENCOUNTER — Encounter: Payer: Self-pay | Admitting: Internal Medicine

## 2016-04-12 ENCOUNTER — Ambulatory Visit (INDEPENDENT_AMBULATORY_CARE_PROVIDER_SITE_OTHER): Payer: BC Managed Care – PPO | Admitting: Internal Medicine

## 2016-04-12 VITALS — BP 116/60 | HR 116 | Temp 98.4°F | Resp 16 | Ht 62.0 in | Wt 175.0 lb

## 2016-04-12 DIAGNOSIS — B372 Candidiasis of skin and nail: Secondary | ICD-10-CM | POA: Diagnosis not present

## 2016-04-12 DIAGNOSIS — R1110 Vomiting, unspecified: Secondary | ICD-10-CM

## 2016-04-12 DIAGNOSIS — R7989 Other specified abnormal findings of blood chemistry: Secondary | ICD-10-CM

## 2016-04-12 DIAGNOSIS — N3000 Acute cystitis without hematuria: Secondary | ICD-10-CM

## 2016-04-12 DIAGNOSIS — B029 Zoster without complications: Secondary | ICD-10-CM

## 2016-04-12 DIAGNOSIS — E860 Dehydration: Secondary | ICD-10-CM

## 2016-04-12 DIAGNOSIS — C50411 Malignant neoplasm of upper-outer quadrant of right female breast: Secondary | ICD-10-CM

## 2016-04-12 DIAGNOSIS — R778 Other specified abnormalities of plasma proteins: Secondary | ICD-10-CM

## 2016-04-12 LAB — CBC WITH DIFFERENTIAL/PLATELET
BASOS ABS: 0 {cells}/uL (ref 0–200)
Basophils Relative: 0 %
EOS PCT: 5 %
Eosinophils Absolute: 270 cells/uL (ref 15–500)
HCT: 34 % — ABNORMAL LOW (ref 35.0–45.0)
Hemoglobin: 10.6 g/dL — ABNORMAL LOW (ref 11.7–15.5)
Lymphocytes Relative: 32 %
Lymphs Abs: 1728 cells/uL (ref 850–3900)
MCH: 27.8 pg (ref 27.0–33.0)
MCHC: 31.2 g/dL — ABNORMAL LOW (ref 32.0–36.0)
MCV: 89.2 fL (ref 80.0–100.0)
MONOS PCT: 14 %
MPV: 9 fL (ref 7.5–12.5)
Monocytes Absolute: 756 cells/uL (ref 200–950)
NEUTROS ABS: 2646 {cells}/uL (ref 1500–7800)
Neutrophils Relative %: 49 %
PLATELETS: 285 10*3/uL (ref 140–400)
RBC: 3.81 MIL/uL (ref 3.80–5.10)
RDW: 20.2 % — ABNORMAL HIGH (ref 11.0–15.0)
WBC: 5.4 10*3/uL (ref 3.8–10.8)

## 2016-04-12 MED ORDER — CLOTRIMAZOLE-BETAMETHASONE 1-0.05 % EX CREA: 1.0000 "application " | TOPICAL_CREAM | Freq: Two times a day (BID) | CUTANEOUS | Status: AC

## 2016-04-12 MED ORDER — FLUCONAZOLE 150 MG PO TABS: 150.0000 mg | ORAL_TABLET | Freq: Once | ORAL | Status: DC

## 2016-04-12 NOTE — Patient Instructions (Signed)
Please use clotrimazole betamethasone cream on the itchy rash on your stomach and groin twice daily.  Keep skin clean and dry. Only wear cotton panties or just keep your bottom half covered with a sheet.  Make sure you dry off really well after bathing.  You can use baby wipes if you need to during the day.  Please use the sample bottles for outbreaks in your eczema.  I have a referral in for you to see Dr. Havery Moros who is a stomach doctor about your nausea and vomiting.  Try to stick with really simple meals (bread, rice, toasts, bananas, apple sauce.    You can expect to continue to have some pain in the left breast and also in your back from the shingles.  This is due to the virus living in your nerves and causing shooting pains.

## 2016-04-12 NOTE — Progress Notes (Signed)
Assessment and Plan: [phone] (if 7 days high complexity)  hospital visit follow up for Urosepsis with intractable nausea and vomiting, with elevated troponin secondary to demand ischemia, and dehydration.  :    1. Acute cystitis without hematuria -no UTI on culture -not treated currently -cont hydration  2. Intractable vomiting with nausea, vomiting of unspecified type -cont antinausea medications, but not clear the source of intractable nausea and vomiting -? Of pain medications being cause for nausea and vomiting - Ambulatory referral to Gastroenterology - CBC with Differential/Platelet - BASIC METABOLIC PANEL WITH GFR  3. Elevated troponin -likely secondary to demand ischemia  4. Dehydration -cont hydration with water  5. Yeast dermatitis - clotrimazole-betamethasone (LOTRISONE) cream; Apply 1 application topically 2 (two) times daily.  Dispense: 30 g; Refill: 0  6.  Breast Cancer -referral to [facility] oncology  7.  Shingles -likely cause of left breast pain is postherpetic neuralgia -patient comforted by this explanation.  I did tell her that this can last for several months after shingles rash resolves.    Over 40 minutes of exam, counseling, chart review, and complex, high level critical decision making was performed this visit.   HPI 60 y.o.female presents for follow up from the hospital. Admit date to the hospital was 04/08/16, patient was discharged from the hospital on 04/10/16 and our office contacted the office the day after discharge to set up a follow up appointment, patient was admitted [id number] with an equivocal UA,  With negative Urine culture on 04/08/16.  She was found to have elevated troponin on admission which was followed and cycled and was improved with hydration.  She was kept on her xarelto and also on her rate controlled medication.  Since being home she was continued to take all her medications.  She has continued to have some nausea and loose stools.  She  also has a rash on the abdomen below her panus which is extremely itchy.  She is using nystatin powder which is not helping much.    She does report that she would like to have a second opinion with an oncology team at [facility].  I did speak to a coordinator on the phone in the room as her daughter scheduled the appointment.  She feels like Dr. Lindi Adie does not address her complaints and take her seriously and she is slightly paranoid that the doctors are trying to harm her.  She reports that the doctor who took care of her in the hospital did a dance when she was discharged.    Images while in the hospital: Dg Chest 2 View  04/08/2016  CLINICAL DATA:  Pt c/o L chest pain and emesis x 4 days and abdominal pain since April. Pain score 3/10. Hx of R breast CA and Pt has not had any treatment since April. cough x 1 month - recent hospitalization for PNA EXAM: CHEST  2 VIEW COMPARISON:  03/24/2016 FINDINGS: Heart size is upper normal. There is persistent streaky density in the right mid lung zone, slightly improved over previous exam. Left lung is clear. Left-sided power port tip overlies the level of the superior vena cava. No pulmonary edema. Visualized osseous structures have a normal appearance. IMPRESSION: 1. Improved aeration in the right lung since prior study. 2. There is persistent asymmetric density in the right upper lobe, warranting follow-up until clear. Electronically Signed   By: Nolon Nations M.D.   On: 04/08/2016 18:29    Past Medical History  Diagnosis Date  .  MRSA (methicillin resistant staph aureus) culture positive 2009     none since per patient  . Eczema   . Hypercholesteremia   . Hypertension   . Kidney stones   . Anemia   . Vitamin D deficiency   . Paroxysmal atrial fibrillation ([place]) 10/2011, 06/2015  . GERD (gastroesophageal reflux disease)   . Atrial fibrillation ([place]) 2016  . Dysrhythmia     A FIB - followed by Dr. Meda Coffee  . Breast cancer of upper-outer quadrant  of right female breast ([place]) 12/12/2015  . Breast cancer ([place])   . Complication of anesthesia 10-06-15     slow to awaken after   . PONV (postoperative nausea and vomiting) 10-06-15    admitted back to hospital for dehydration  . Family history of breast cancer   . Family history of prostate cancer      Allergies  Allergen Reactions  . Penicillins Other (See Comments)    Has patient had a PCN reaction causing immediate rash, facial/tongue/throat swelling, SOB or lightheadedness with hypotension: NO Has patient had a PCN reaction causing severe rash involving mucus membranes or skin necrosis: NO Has patient had a PCN reaction that required hospitalization NO Has patient had a PCN reaction occurring within the last 10 years: NO If all of the above answers are "NO", then may proceed with Cephalosporin use.      Current Outpatient Prescriptions on File Prior to Visit  Medication Sig Dispense Refill  . acetaminophen (TYLENOL) 650 MG CR tablet Take 1,300 mg by mouth every 8 (eight) hours as needed for pain.    Marland Kitchen atenolol (TENORMIN) 100 MG tablet Take 100 mg by mouth daily.    Marland Kitchen atorvastatin (LIPITOR) 80 MG tablet take 1 tablet by mouth once daily 30 tablet 3  . cholecalciferol (VITAMIN D) 1000 UNITS tablet Take 4,000 Units by mouth daily.    Marland Kitchen diltiazem (CARDIZEM CD) 120 MG 24 hr capsule Take 1 capsule (120 mg total) by mouth daily. 90 capsule 3  . ferrous sulfate 325 (65 FE) MG tablet Take 325 mg by mouth daily with breakfast.    . gabapentin (NEURONTIN) 100 MG capsule Take 100 mg by mouth 3 (three) times daily.    Marland Kitchen LORazepam (ATIVAN) 0.5 MG tablet Take 0.5 mg by mouth every 6 (six) hours as needed. anxiety    . montelukast (SINGULAIR) 10 MG tablet Take 1 tablet (10 mg total) by mouth daily. 30 tablet 2  . morphine (MS CONTIN) 15 MG 12 hr tablet Take 1 tablet (15 mg total) by mouth every 12 (twelve) hours as needed for pain. 10 tablet 0  . nystatin (MYCOSTATIN/NYSTOP) powder Apply  topically 2 (two) times daily. 15 g 0  . ondansetron (ZOFRAN) 8 MG tablet Take 1 tablet (8 mg total) by mouth every 8 (eight) hours as needed for nausea or vomiting. 20 tablet 0  . oxyCODONE-acetaminophen (PERCOCET/ROXICET) 5-325 MG tablet Take 2 tablets by mouth every 8 (eight) hours as needed for severe pain. 10 tablet 0  . PAZEO 0.7 % SOLN Place 1 drop into both eyes daily as needed (allergies).     . potassium chloride SA (K-DUR,KLOR-CON) 20 MEQ tablet Take 1 tablet (20 mEq total) by mouth 2 (two) times daily. 60 tablet 6  . prochlorperazine (COMPAZINE) 10 MG tablet Take 1 tablet (10 mg total) by mouth every 6 (six) hours as needed (Nausea or vomiting). 30 tablet 1  . promethazine (PHENERGAN) 12.5 MG tablet Take 2 tablets (25 mg total)  by mouth every 6 (six) hours as needed for nausea or vomiting. 20 tablet 00  . promethazine (PHENERGAN) 25 MG suppository Place 1 suppository (25 mg total) rectally every 6 (six) hours as needed for nausea. 12 suppository 1  . rivaroxaban (XARELTO) 20 MG TABS tablet Take 1 tablet (20 mg total) by mouth daily with supper. 90 tablet 3  . triamcinolone cream (KENALOG) 0.1 % Apply 1 application topically 3 (three) times daily. 80 g 0  . triamterene-hydrochlorothiazide (MAXZIDE-25) 37.5-25 MG tablet Take 1 tablet by mouth daily.    . valACYclovir (VALTREX) 1000 MG tablet Take 1 tablet (1,000 mg total) by mouth 3 (three) times daily. 7 tablet 0   No current facility-administered medications on file prior to visit.   Review of Systems  Constitutional: Positive for malaise/fatigue. Negative for fever and chills.  HENT: Negative for congestion, ear pain and sore throat.   Eyes: Negative.   Respiratory: Negative for cough, shortness of breath and wheezing.   Cardiovascular: Positive for chest pain (left breast pain). Negative for palpitations and leg swelling.  Gastrointestinal: Positive for nausea, vomiting, abdominal pain and diarrhea. Negative for heartburn,  constipation, blood in stool and melena.  Genitourinary: Negative.   Skin: Positive for itching and rash.  Neurological: Negative for dizziness, sensory change, loss of consciousness and headaches.  Psychiatric/Behavioral: Negative for depression. The patient is not nervous/anxious and does not have insomnia.      Physical Exam: Filed Weights   04/12/16 1459  Weight: 175 lb (79.379 kg)   BP 116/60 mmHg  Pulse 116  Temp(Src) 98.4 F (36.9 C) (Temporal)  Resp 16  Ht 5\' 2"  (1.575 m)  Wt 175 lb (79.379 kg)  BMI 32.00 kg/m2 General Appearance: Well nourished, in no apparent distress. Eyes: PERRLA, EOMs, conjunctiva no swelling or erythema Sinuses: No Frontal/maxillary tenderness ENT/Mouth: Ext aud canals clear, TMs without erythema, bulging. No erythema, swelling, or exudate on post pharynx.  Tonsils not swollen or erythematous. Hearing normal.  Neck: Supple, thyroid normal.  Respiratory: Respiratory effort normal, BS equal bilaterally without rales, rhonchi, wheezing or stridor.  Cardio: RRR with no MRGs. Brisk peripheral pulses without edema.  Abdomen: Soft, + BS.  Non tender, no guarding, rebound, hernias, masses. Lymphatics: Non tender without lymphadenopathy.  Musculoskeletal: Full ROM, 5/5 strength, normal gait.  Skin: Warm, dry with excoration and redness to the back of the neck and some mild erythema to the bilateral cheeks.  There is an erythematous beefy red rash to the cruris region and under the panus of her abdomen.   Neuro: Cranial nerves intact. Normal muscle tone, no cerebellar symptoms. Sensation intact.  Psych: Awake and oriented X 3, normal affect, Insight and Judgment appropriate.     Starlyn Skeans, PA-C 1:52 PM [facility]

## 2016-04-13 ENCOUNTER — Telehealth: Payer: Self-pay | Admitting: *Deleted

## 2016-04-13 LAB — BASIC METABOLIC PANEL WITH GFR
BUN: 3 mg/dL — ABNORMAL LOW (ref 7–25)
CHLORIDE: 106 mmol/L (ref 98–110)
CO2: 23 mmol/L (ref 20–31)
CREATININE: 0.59 mg/dL (ref 0.50–0.99)
Calcium: 8.9 mg/dL (ref 8.6–10.4)
GFR, Est African American: >89 mL/min (ref >=60)
GFR, Est Non African American: >89 mL/min (ref >=60)
Glucose, Bld: 76 mg/dL (ref 65–99)
Potassium: 3.9 mmol/L (ref 3.5–5.3)
SODIUM: 141 mmol/L (ref 135–146)

## 2016-04-13 NOTE — ED Provider Notes (Signed)
CSN: [id number]     Arrival date & time 03/28/16  [id number] History   First MD Initiated Contact with Patient 03/28/16 [id number]     Chief Complaint  Patient presents with  . Diarrhea  . Emesis     (Consider location/radiation/quality/duration/timing/severity/associated sxs/prior Treatment) HPI   37yF presenting with daughter for evaluation of diarrhea, abdominal pain, and emesis onset Friday after being discharged from hospital for Pneumonia. Patient has ongoing issues with nausea and emesis. Active shingles infection with open lesions to lateral and posterior thorax. History of breast cancer, last chemo treatment on April.          Past Medical History  Diagnosis Date  . MRSA (methicillin resistant staph aureus) culture positive 2009     none since per patient  . Eczema   . Hypercholesteremia   . Hypertension   . Kidney stones   . Anemia   . Vitamin D deficiency   . Paroxysmal atrial fibrillation ([place]) 10/2011, 06/2015  . GERD (gastroesophageal reflux disease)   . Atrial fibrillation ([place]) 2016  . Dysrhythmia     A FIB - followed by Dr. Meda Coffee  . Breast cancer of upper-outer quadrant of right female breast ([place]) 12/12/2015  . Breast cancer ([place])   . Complication of anesthesia 10-06-15     slow to awaken after   . PONV (postoperative nausea and vomiting) 10-06-15    admitted back to hospital for dehydration  . Family history of breast cancer   . Family history of prostate cancer    Past Surgical History  Procedure Laterality Date  . Breast surgery Left     biopsy-neg  . Kidney stone surgery  2013  . Nephrolithotomy Right 10/06/2015    Procedure: NEPHROLITHOTOMY PERCUTANEOUS;  Surgeon: Kathie Rhodes, MD;  Location: [facility];  Service: Urology;  Laterality: Right;  . Cystoscopy with holmium laser lithotripsy Right 10/06/2015    Procedure: CYSTOSCOPY WITH HOLMIUM LASER LITHOTRIPSY;  Surgeon: Kathie Rhodes, MD;  Location: [facility];  Service: Urology;  Laterality: Right;  . Portacath  placement Left 12/23/2015    Procedure: INSERTION PORT-A-CATH WITH [place];  Surgeon: Stark Klein, MD;  Location: [facility];  Service: General;  Laterality: Left;   Family History  Problem Relation Age of Onset  . Hypertension Mother   . Hepatitis C Mother   . Heart disease Father   . Dementia Father   . Aneurysm Brother   . Breast cancer Paternal Aunt     dx in her 64s  . Prostate cancer Paternal Uncle     85s  . Prostate cancer Paternal Uncle     dx in his 96s  . Leukemia Paternal Aunt   . Breast cancer Cousin     mother's paternal first cousin  . Thyroid cancer Cousin     mother's paternal first cousin   Social History  Substance Use Topics  . Smoking status: Never Smoker   . Smokeless tobacco: Never Used  . Alcohol Use: No   OB History    No data available     Review of Systems    Allergies  Penicillins  Home Medications   Prior to Admission medications   Medication Sig Start Date End Date Taking? Authorizing Provider  acetaminophen (TYLENOL) 650 MG CR tablet Take 1,300 mg by mouth every 8 (eight) hours as needed for pain.   Yes Historical Provider, MD  atorvastatin (LIPITOR) 80 MG tablet take 1 tablet by mouth once daily 03/10/16  Yes Unk Pinto, MD  cholecalciferol (VITAMIN D) 1000 UNITS tablet Take 4,000 Units by mouth daily.   Yes Historical Provider, MD  diltiazem (CARDIZEM CD) 120 MG 24 hr capsule Take 1 capsule (120 mg total) by mouth daily. 10/24/15  Yes Dorothy Spark, MD  ferrous sulfate 325 (65 FE) MG tablet Take 325 mg by mouth daily with breakfast.   Yes Historical Provider, MD  LORazepam (ATIVAN) 0.5 MG tablet Take 0.5 mg by mouth every 6 (six) hours as needed. anxiety 02/11/16  Yes Historical Provider, MD  montelukast (SINGULAIR) 10 MG tablet Take 1 tablet (10 mg total) by mouth daily. 12/26/15 12/25/16 Yes Courtney Forcucci, PA-C  oxyCODONE-acetaminophen (PERCOCET/ROXICET) 5-325 MG tablet Take 2 tablets by mouth every 8 (eight) hours as needed for severe  pain. 03/26/16  Yes Kelvin Cellar, MD  PAZEO 0.7 % SOLN Place 1 drop into both eyes daily as needed (allergies).  12/15/15  Yes Historical Provider, MD  prochlorperazine (COMPAZINE) 10 MG tablet Take 1 tablet (10 mg total) by mouth every 6 (six) hours as needed (Nausea or vomiting). 02/11/16  Yes Nicholas Lose, MD  rivaroxaban (XARELTO) 20 MG TABS tablet Take 1 tablet (20 mg total) by mouth daily with supper. 01/23/16  Yes Theodis Blaze, MD  triamcinolone cream (KENALOG) 0.1 % Apply 1 application topically 3 (three) times daily. 03/10/16  Yes Nicholas Lose, MD  triamterene-hydrochlorothiazide (MAXZIDE-25) 37.5-25 MG tablet Take 1 tablet by mouth daily. 02/28/16  Yes Historical Provider, MD  valACYclovir (VALTREX) 1000 MG tablet Take 1 tablet (1,000 mg total) by mouth 3 (three) times daily. 03/26/16  Yes Kelvin Cellar, MD  atenolol (TENORMIN) 100 MG tablet Take 100 mg by mouth daily.    Historical Provider, MD  clotrimazole-betamethasone (LOTRISONE) cream Apply 1 application topically 2 (two) times daily. 04/12/16   Courtney Forcucci, PA-C  fluconazole (DIFLUCAN) 150 MG tablet Take 1 tablet (150 mg total) by mouth once. 04/12/16   Courtney Forcucci, PA-C  gabapentin (NEURONTIN) 100 MG capsule Take 100 mg by mouth 3 (three) times daily. 03/16/16   Historical Provider, MD  morphine (MS CONTIN) 15 MG 12 hr tablet Take 1 tablet (15 mg total) by mouth every 12 (twelve) hours as needed for pain. 03/28/16   Virgel Manifold, MD  nystatin (MYCOSTATIN/NYSTOP) powder Apply topically 2 (two) times daily. 04/10/16   Thurnell Lose, MD  ondansetron (ZOFRAN) 8 MG tablet Take 1 tablet (8 mg total) by mouth every 8 (eight) hours as needed for nausea or vomiting. 04/10/16   Thurnell Lose, MD  potassium chloride SA (K-DUR,KLOR-CON) 20 MEQ tablet Take 1 tablet (20 mEq total) by mouth 2 (two) times daily. 03/29/16   Dorothy Spark, MD  promethazine (PHENERGAN) 12.5 MG tablet Take 2 tablets (25 mg total) by mouth every 6 (six) hours  as needed for nausea or vomiting. 04/10/16   Thurnell Lose, MD  promethazine (PHENERGAN) 25 MG suppository Place 1 suppository (25 mg total) rectally every 6 (six) hours as needed for nausea. 04/07/16 04/07/17  Courtney Forcucci, PA-C   BP 139/62 mmHg  Pulse 100  Temp(Src) 98.6 F (37 C) (Oral)  Resp 16  SpO2 98% Physical Exam  Constitutional: She appears well-developed and well-nourished. No distress.  Laying in bed. Appears very tired but not toxic.   HENT:  Head: Normocephalic and atraumatic.  Eyes: Conjunctivae are normal. Right eye exhibits no discharge. Left eye exhibits no discharge.  Neck: Neck supple.  Cardiovascular: Normal rate, regular rhythm and normal heart sounds.  Exam reveals  no gallop and no friction rub.   No murmur heard. Pulmonary/Chest: Effort normal and breath sounds normal. No respiratory distress.  Abdominal: Soft. She exhibits no distension. There is no tenderness.  Musculoskeletal: She exhibits no edema or tenderness.  Neurological: She is alert.  Skin: Skin is warm and dry.  Several large, but fairly superficial wounds to left lateral chest and left mid back. Healthy-appearing wound margins and pink granulation tissue at the base. No purulence. No surrounding cellulitis or other concerning skin changes.  Psychiatric: She has a normal mood and affect. Her behavior is normal. Thought content normal.  Nursing note and vitals reviewed.   ED Course  Procedures (including critical care time) Labs Review Labs Reviewed  COMPREHENSIVE METABOLIC PANEL - Abnormal; Notable for the following:    Potassium 2.7 (*)    BUN <5 (*)    Total Protein 6.4 (*)    Albumin 2.8 (*)    ALT 13 (*)    All other components within normal limits  CBC - Abnormal; Notable for the following:    WBC 3.1 (*)    RBC 3.84 (*)    Hemoglobin 10.8 (*)    HCT 33.1 (*)    RDW 19.8 (*)    All other components within normal limits  URINALYSIS, ROUTINE W REFLEX MICROSCOPIC (NOT AT [facility]) -  Abnormal; Notable for the following:    APPearance HAZY (*)    Hgb urine dipstick SMALL (*)    Ketones, ur 40 (*)    Leukocytes, UA SMALL (*)    All other components within normal limits  MAGNESIUM - Abnormal; Notable for the following:    Magnesium 1.2 (*)    All other components within normal limits  URINE MICROSCOPIC-ADD ON - Abnormal; Notable for the following:    Squamous Epithelial / LPF 6-30 (*)    Bacteria, UA RARE (*)    All other components within normal limits  C DIFFICILE QUICK SCREEN W PCR REFLEX  LIPASE, BLOOD    Imaging Review No results found. I have personally reviewed and evaluated these images and lab results as part of my medical decision-making.   EKG Interpretation None      MDM   Final diagnoses:  Nausea vomiting and diarrhea  Physical deconditioning        Virgel Manifold, MD 04/13/16 1114

## 2016-04-13 NOTE — Telephone Encounter (Signed)
Patient called with concerns about vomiting and diarrhea that started last night.  Patient states she ate dinner then within a few hours she began to vomit throughout the night.  Patient states she tried to eat breakfast this morning and drank a cup of milk.  Now she is experiencing diarrhea.  Patient was advised per Starlyn Skeans, PA-C that she needs to take her Phenergan or Zofran AD and not eat anything for a bit, to allow the Rx to work.  She was also advised that she will need to take 2 Immodium pills right now to help stop the diarrhea and I advised patient to call if anything increases or does not get any better.  I asked the patient to call me this afternoon with status update.

## 2016-04-14 ENCOUNTER — Other Ambulatory Visit: Payer: Self-pay

## 2016-04-14 MED ORDER — GABAPENTIN 100 MG PO CAPS: 300.0000 mg | ORAL_CAPSULE | Freq: Three times a day (TID) | ORAL | Status: DC

## 2016-04-14 MED ORDER — PROMETHAZINE HCL 12.5 MG PO TABS: 12.5000 mg | ORAL_TABLET | Freq: Four times a day (QID) | ORAL | Status: DC | PRN

## 2016-04-14 MED ORDER — VALACYCLOVIR HCL 1 G PO TABS: 1000.0000 mg | ORAL_TABLET | Freq: Three times a day (TID) | ORAL | Status: DC

## 2016-04-14 MED ORDER — ONDANSETRON HCL 8 MG PO TABS: 8.0000 mg | ORAL_TABLET | Freq: Three times a day (TID) | ORAL | Status: DC | PRN

## 2016-04-14 MED ORDER — OXYCODONE-ACETAMINOPHEN 5-325 MG PO TABS: 2.0000 | ORAL_TABLET | Freq: Three times a day (TID) | ORAL | Status: DC | PRN

## 2016-04-14 NOTE — Telephone Encounter (Signed)
Received VM from pt's daughter stating she needed refills for her mother.  Caller states her mother is having severe issues with nausea and vomiting and pain.  Upon further investigation, pt seen by PCP 2 days ago for follow up from hospital admission.  Per office note, pt experiencing pain from shingles.  Also, no known etiology for n/v so referral was placed by pt's PCP for evaluation by GI.  All information from daughter and from previous office notes reviewed with Dr. Lindi Adie.  Verbal orders given for refill of zofran and phenergan in hopes of controlling nausea and vomiting until appointment made with GI.  As for pain, Percocet refilled and pt to increase gabapentin dose from 100mg  TID to 300mg  TID.  Pt also to complete another 7 days of Valtrex at 1000mg  TID.  Spoke with both pt and daughter for quite some time regarding these medications and how to properly take them.  Also discussed the importance of seeing GI to find cause of this nausea and vomiting as patient has been off chemo since 01/2016.  Both in agreement with plan of care and verbalized understanding to let us know should further issues or additional questions or concerns arise.

## 2016-04-16 ENCOUNTER — Other Ambulatory Visit: Payer: Self-pay | Admitting: General Surgery

## 2016-04-16 DIAGNOSIS — C50411 Malignant neoplasm of upper-outer quadrant of right female breast: Secondary | ICD-10-CM

## 2016-04-21 ENCOUNTER — Encounter: Payer: Self-pay | Admitting: Hematology and Oncology

## 2016-04-21 NOTE — Progress Notes (Signed)
left in my box- std forms

## 2016-04-22 ENCOUNTER — Encounter: Payer: Self-pay | Admitting: Hematology and Oncology

## 2016-04-22 NOTE — Progress Notes (Signed)
left in my box- left for dr Lindi Adie to sign

## 2016-04-23 ENCOUNTER — Encounter: Payer: Self-pay | Admitting: *Deleted

## 2016-04-23 ENCOUNTER — Telehealth: Payer: Self-pay | Admitting: Hematology and Oncology

## 2016-04-23 ENCOUNTER — Encounter: Payer: Self-pay | Admitting: Hematology and Oncology

## 2016-04-23 NOTE — Telephone Encounter (Signed)
appt was made for Aug and a calendar was sent to patient by mail

## 2016-04-23 NOTE — Progress Notes (Signed)
left in my box- left for dr Lindi Adie to sign- let patient know forms are ready for pk up-sent to medical recds

## 2016-04-27 ENCOUNTER — Encounter: Payer: Self-pay | Admitting: Internal Medicine

## 2016-04-27 ENCOUNTER — Ambulatory Visit (INDEPENDENT_AMBULATORY_CARE_PROVIDER_SITE_OTHER): Payer: BC Managed Care – PPO | Admitting: Internal Medicine

## 2016-04-27 VITALS — BP 130/80 | HR 61 | Temp 97.3°F | Resp 16 | Ht 62.0 in | Wt 161.8 lb

## 2016-04-27 DIAGNOSIS — I4891 Unspecified atrial fibrillation: Secondary | ICD-10-CM | POA: Diagnosis not present

## 2016-04-27 DIAGNOSIS — L98499 Non-pressure chronic ulcer of skin of other sites with unspecified severity: Secondary | ICD-10-CM | POA: Diagnosis not present

## 2016-04-27 DIAGNOSIS — I10 Essential (primary) hypertension: Secondary | ICD-10-CM | POA: Diagnosis not present

## 2016-04-27 DIAGNOSIS — E782 Mixed hyperlipidemia: Secondary | ICD-10-CM | POA: Diagnosis not present

## 2016-04-27 DIAGNOSIS — C50411 Malignant neoplasm of upper-outer quadrant of right female breast: Secondary | ICD-10-CM

## 2016-04-27 DIAGNOSIS — B029 Zoster without complications: Secondary | ICD-10-CM

## 2016-04-27 DIAGNOSIS — R7303 Prediabetes: Secondary | ICD-10-CM | POA: Diagnosis not present

## 2016-04-27 DIAGNOSIS — E559 Vitamin D deficiency, unspecified: Secondary | ICD-10-CM

## 2016-04-27 NOTE — Progress Notes (Signed)
Assessment and Plan:  Hypertension:  -Continue medication,  -monitor blood pressure at home.  -Continue DASH diet.   -Reminder to go to the ER if any CP, SOB, nausea, dizziness, severe HA, changes vision/speech, left arm numbness and tingling, and jaw pain.  Cholesterol: -Continue diet and exercise. .   Pre-diabetes: -Continue diet and exercise.   Vitamin D Def: -continue medications.   Breast cancer -followed by Dr. Lindi Church -is due for lumpectomy at the end of the month -followed by radiation therapy  Eczema -stop bathing so frequently  -cont kenalog cream -vaseline twice daily  Nausea and vomiting -zofran sent in -GI referral pending currently.  Zoster outbreak -wound care reordered.  Continue diet and meds as discussed. Further disposition pending results of labs.  HPI 60 y.o. female  presents for 3 month follow up with hypertension, hyperlipidemia, prediabetes and vitamin D.   Her blood pressure has been controlled at home, today their BP is BP: 130/80 mmHg.   She does workout. She denies chest pain, shortness of breath, dizziness.  She has not had any palpitations recently.  She has not seen Dr. Meda Church recently for her afib.     She is on cholesterol medication and denies myalgias. Her cholesterol is not at goal. The cholesterol last visit was:   Lab Results  Component Value Date   CHOL 227* 01/27/2016   HDL 48 01/27/2016   LDLCALC 139* 01/27/2016   TRIG 200* 01/27/2016   CHOLHDL 4.7 01/27/2016     She has been working on diet and exercise for prediabetes, and denies foot ulcerations, hyperglycemia, hypoglycemia , increased appetite, nausea, paresthesia of the feet, polydipsia, polyuria, visual disturbances, vomiting and weight loss. Last A1C in the office was:  Lab Results  Component Value Date   HGBA1C 6.1* 01/27/2016  Her diet has changed due to nausea and vomiting secondary to the chemo and she has had a lot of bowel troubles.  She has not been able to  get into GI yet.  She is taking 3 antinausea medications currently.  Patient is on Vitamin D supplement.  Lab Results  Component Value Date   VD25OH 42 01/27/2016     She is scheduled for her breast surgery with Dr. Barry Church and removal of the port a cath on July the 26th.  She is excited about this.  She did choose to not proceed with a referral to [facility] oncology.  She is happy with Dr. Lindi Church and will stay with his care.  She is due to have radiation after resolution of lumpectomy with lymph node dissection.   She is still having a hard time with her shingles wounds.  She continues to keep them covered and use vaseline.  She would like to have wound care come back out.      Current Medications:  Current Outpatient Prescriptions on File Prior to Visit  Medication Sig Dispense Refill  . acetaminophen (TYLENOL) 650 MG CR tablet Take 1,300 mg by mouth every 8 (eight) hours as needed for pain.    Marland Kitchen atenolol (TENORMIN) 100 MG tablet Take 100 mg by mouth daily.    Marland Kitchen atorvastatin (LIPITOR) 80 MG tablet take 1 tablet by mouth once daily 30 tablet 3  . cholecalciferol (VITAMIN D) 1000 UNITS tablet Take 4,000 Units by mouth daily.    . clotrimazole-betamethasone (LOTRISONE) cream Apply 1 application topically 2 (two) times daily. 30 g 0  . diltiazem (CARDIZEM CD) 120 MG 24 hr capsule Take 1 capsule (120 mg total)  by mouth daily. 90 capsule 3  . ferrous sulfate 325 (65 FE) MG tablet Take 325 mg by mouth daily with breakfast.    . gabapentin (NEURONTIN) 100 MG capsule Take 3 capsules (300 mg total) by mouth 3 (three) times daily. 180 capsule 0  . montelukast (SINGULAIR) 10 MG tablet Take 1 tablet (10 mg total) by mouth daily. 30 tablet 2  . nystatin (MYCOSTATIN/NYSTOP) powder Apply topically 2 (two) times daily. 15 g 0  . ondansetron (ZOFRAN) 8 MG tablet Take 1 tablet (8 mg total) by mouth every 8 (eight) hours as needed for nausea or vomiting. 60 tablet 0  . oxyCODONE-acetaminophen (PERCOCET/ROXICET)  5-325 MG tablet Take 2 tablets by mouth every 8 (eight) hours as needed for severe pain. 30 tablet 0  . PAZEO 0.7 % SOLN Place 1 drop into both eyes daily as needed (allergies).     . potassium chloride SA (K-DUR,KLOR-CON) 20 MEQ tablet Take 1 tablet (20 mEq total) by mouth 2 (two) times daily. 60 tablet 6  . prochlorperazine (COMPAZINE) 10 MG tablet Take 1 tablet (10 mg total) by mouth every 6 (six) hours as needed (Nausea or vomiting). 30 tablet 1  . promethazine (PHENERGAN) 12.5 MG tablet Take 1 tablet (12.5 mg total) by mouth every 6 (six) hours as needed for nausea or vomiting. 60 tablet 00  . rivaroxaban (XARELTO) 20 MG TABS tablet Take 1 tablet (20 mg total) by mouth daily with supper. 90 tablet 3  . triamcinolone cream (KENALOG) 0.1 % Apply 1 application topically 3 (three) times daily. 80 g 0  . triamterene-hydrochlorothiazide (MAXZIDE-25) 37.5-25 MG tablet Take 1 tablet by mouth daily.     No current facility-administered medications on file prior to visit.    Medical History:  Past Medical History  Diagnosis Date  . MRSA (methicillin resistant staph aureus) culture positive 2009     none since per patient  . Eczema   . Hypercholesteremia   . Hypertension   . Kidney stones   . Anemia   . Vitamin D deficiency   . Paroxysmal atrial fibrillation ([place]) 10/2011, 06/2015  . GERD (gastroesophageal reflux disease)   . Atrial fibrillation ([place]) 2016  . Dysrhythmia     A FIB - followed by Dr. Meda Church  . Breast cancer of upper-outer quadrant of right female breast ([place]) 12/12/2015  . Breast cancer ([place])   . Complication of anesthesia 10-06-15     slow to awaken after   . PONV (postoperative nausea and vomiting) 10-06-15    admitted back to hospital for dehydration  . Family history of breast cancer   . Family history of prostate cancer     Allergies:  Allergies  Allergen Reactions  . Penicillins Other (See Comments)    Has patient had a PCN reaction causing immediate rash,  facial/tongue/throat swelling, SOB or lightheadedness with hypotension: NO Has patient had a PCN reaction causing severe rash involving mucus membranes or skin necrosis: NO Has patient had a PCN reaction that required hospitalization NO Has patient had a PCN reaction occurring within the last 10 years: NO If all of the above answers are "NO", then may proceed with Cephalosporin use.     Review of Systems:  Review of Systems  Constitutional: Negative for fever, chills and malaise/fatigue.  HENT: Negative for congestion, ear pain and sore throat.   Respiratory: Negative for cough, shortness of breath and wheezing.   Cardiovascular: Negative for chest pain, palpitations and leg swelling.  Gastrointestinal: Positive for  nausea. Negative for heartburn, vomiting, abdominal pain, diarrhea, constipation, blood in stool and melena.  Genitourinary: Negative.   Skin: Positive for rash.  Neurological: Negative for dizziness, sensory change, loss of consciousness and headaches.  Psychiatric/Behavioral: Negative for depression. The patient is not nervous/anxious and does not have insomnia.     Family history- Review and unchanged  Social history- Review and unchanged  Physical Exam: BP 130/80 mmHg  Pulse 61  Temp(Src) 97.3 F (36.3 C) (Temporal)  Resp 16  Ht 5\' 2"  (1.575 m)  Wt 161 lb 12.8 oz (73.392 kg)  BMI 29.59 kg/m2  SpO2 99% Wt Readings from Last 3 Encounters:  04/27/16 161 lb 12.8 oz (73.392 kg)  04/12/16 175 lb (79.379 kg)  04/09/16 166 lb 10.7 oz (75.6 kg)    General Appearance: Well nourished well developed, in no apparent distress. Eyes: PERRLA, EOMs, conjunctiva no swelling or erythema ENT/Mouth: Ear canals normal without obstruction, swelling, erythma, discharge.  TMs normal bilaterally.  Oropharynx moist, clear, without exudate, or postoropharyngeal swelling. Neck: Supple, thyroid normal,no cervical adenopathy  Respiratory: Respiratory effort normal, Breath sounds clear  A&P without rhonchi, wheeze, or rale.  No retractions, no accessory usage. Cardio: RRR with no MRGs. Brisk peripheral pulses without edema.  Abdomen: Soft, + BS,  Non tender, no guarding, rebound, hernias, masses. Musculoskeletal: Full ROM, 5/5 strength, Normal gait Skin: Dry eczematous skin globally.  There are erosions of the skin to the left breast and left back which are healing but still present.  Warm, dry without rashes, lesions, ecchymosis.  Neuro: Awake and oriented X 3, Cranial nerves intact. Normal muscle tone, no cerebellar symptoms. Psych: Normal affect, Insight and Judgment appropriate.    Audrey Skeans, PA-C 4:08 PM [facility]

## 2016-04-28 ENCOUNTER — Encounter (HOSPITAL_BASED_OUTPATIENT_CLINIC_OR_DEPARTMENT_OTHER): Payer: Self-pay | Admitting: *Deleted

## 2016-04-30 ENCOUNTER — Telehealth: Payer: Self-pay

## 2016-04-30 NOTE — Telephone Encounter (Signed)
Received VM from pt's daughter requesting [place] to complete pre-authorization process for Zofran.  I informed caller that I would ensure our managed care department had received the paper work and was aware that this needed to be completed.  Pt's daughter also asked for August appointment with Dr. Lindi Adie for follow up.  I informed her that pt was already scheduled for 8/4.  Caller stated that would work for their schedule and they would be here for follow up on that date.  Pt without further questions at time of call. Information forwarded to Raquel for pre-auth.

## 2016-05-03 ENCOUNTER — Encounter: Payer: Self-pay | Admitting: Hematology and Oncology

## 2016-05-03 ENCOUNTER — Telehealth: Payer: Self-pay | Admitting: Cardiology

## 2016-05-03 NOTE — Telephone Encounter (Signed)
Yes, she can have her teeth extracted, she should stop Xarelto a day prior and restart a day after the procedure.

## 2016-05-03 NOTE — Telephone Encounter (Signed)
WILL FORWARD  TO DR Meda Coffee TO REVIEW  IF  PT  MAY HAVE TEETH EXTRACTED  .Adonis Housekeeper

## 2016-05-03 NOTE — Progress Notes (Signed)
Sent to covermymeds -zofran prior auth.

## 2016-05-03 NOTE — Telephone Encounter (Signed)
New message   Pt dtr states that 2 notes are needed for the pt to have her teeth extracted due to having AF and to stop Xarelto for the dental surgery. Please call.

## 2016-05-04 ENCOUNTER — Telehealth: Payer: Self-pay | Admitting: Pharmacist

## 2016-05-04 NOTE — Telephone Encounter (Signed)
Daughter Vira Agar) called.  We don't have DPR to speak with her so spoke w/Ms. Gladney who gave permission to speak with Vira Agar.  Advised her that Dr. Meda Coffee advised she could stop Xarelto day prior to procedure and restart the next day.  [facility] [address] on Mon 7/24.  Will fax note to them advising that Xarelto should be stopped on 7/23 and restarted on 7/25.  Fax number is [phone].

## 2016-05-04 NOTE — Telephone Encounter (Signed)
Pt's daughter left a message at the front desk that patient is have a lumpectomy by Dr. Barry Dienes on 05/13/16.  Per note, pt would need to stop Xarelto on 7/21.  Reviewed pt's chart.  She has a CHADS score of 1.  She also has a dental procedure on 7/24 and has already been instructed to stop Xarelto on 7/23.  Will have patient follow these instructions and remain off Xarelto until after procedure on 7/27.  This will give her a total of 4 days off anticoagulation prior to surgery, which will be more than sufficient.  Will fax information to Dr. Marlowe Aschoff office.

## 2016-05-07 ENCOUNTER — Other Ambulatory Visit: Payer: Self-pay

## 2016-05-07 ENCOUNTER — Other Ambulatory Visit: Payer: Self-pay | Admitting: Hematology and Oncology

## 2016-05-07 ENCOUNTER — Encounter: Payer: Self-pay | Admitting: Hematology and Oncology

## 2016-05-07 ENCOUNTER — Telehealth: Payer: Self-pay

## 2016-05-07 MED ORDER — OXYCODONE-ACETAMINOPHEN 5-325 MG PO TABS: 2.0000 | ORAL_TABLET | Freq: Three times a day (TID) | ORAL | Status: DC | PRN

## 2016-05-07 NOTE — Progress Notes (Signed)
Per yasmine [facility], the patient can get 12 today and can't get anymore for 21 days and she can get 18 at that time per her plan. I let Eulas Post know to let patient know. She got 12 tablets on 6/26 and then just got 6 on 04/27/16.-zofran prior auth. Per nurse patient is going to see GI- per yasmine at that time that dr can put in script for more based on her diagnosis at that time

## 2016-05-07 NOTE — Telephone Encounter (Signed)
Spoke with Raquel in managed care regarding update on Zofran authorization status.  According to Raquel, insurance has approved 12 tablets today.  Pt can not receive any more Zofran for 21 days after this and at that time she will be limited to quantity of 18.  Once pt is seen and evaluated by GI and if Zofran is deemed the medication the GI physician wishes to use, insurance will reevaluate.  Called pt's daughter back and discussed this with her.  I informed her she should follow up with PCP who made GI referral so as to get this appointment scheduled as soon as possible.  Pt's daughter verbalized understanding and no further questions at time of call.

## 2016-05-07 NOTE — Telephone Encounter (Signed)
Received VM from pt's daughter requesting refills for phenergan and percocet.  Chart reviewed and refills deemed appropriate.  Pt also questioning if Zofran was approved by insurance.  Spoke with Raquel in managed care who states it was denied and needed further information which I provided.  Called pt's daughter back to discuss.  Informed her percocet prescription would be available for pick up this afternoon and phenergan refill was sent directly to pharmacy.  Discussed current status of zofran authorization.  I informed her I would contact her once I knew more regarding Zofran.  Pt without further questions or concerns at time of call.

## 2016-05-11 ENCOUNTER — Encounter (HOSPITAL_BASED_OUTPATIENT_CLINIC_OR_DEPARTMENT_OTHER)
Admission: RE | Admit: 2016-05-11 | Discharge: 2016-05-11 | Disposition: A | Discharge status: Home / Self Care | Payer: BC Managed Care – PPO | Source: Ambulatory Visit | Attending: General Surgery | Admitting: General Surgery

## 2016-05-11 ENCOUNTER — Ambulatory Visit
Admission: RE | Admit: 2016-05-11 | Discharge: 2016-05-11 | Disposition: A | Discharge status: Home / Self Care | Payer: BC Managed Care – PPO | Source: Ambulatory Visit | Attending: General Surgery | Admitting: General Surgery

## 2016-05-11 DIAGNOSIS — C50411 Malignant neoplasm of upper-outer quadrant of right female breast: Secondary | ICD-10-CM

## 2016-05-11 DIAGNOSIS — I10 Essential (primary) hypertension: Secondary | ICD-10-CM | POA: Diagnosis not present

## 2016-05-11 DIAGNOSIS — Z87440 Personal history of urinary (tract) infections: Secondary | ICD-10-CM | POA: Diagnosis not present

## 2016-05-11 DIAGNOSIS — Z9221 Personal history of antineoplastic chemotherapy: Secondary | ICD-10-CM | POA: Diagnosis not present

## 2016-05-11 DIAGNOSIS — K219 Gastro-esophageal reflux disease without esophagitis: Secondary | ICD-10-CM | POA: Diagnosis not present

## 2016-05-11 DIAGNOSIS — C50919 Malignant neoplasm of unspecified site of unspecified female breast: Secondary | ICD-10-CM | POA: Diagnosis not present

## 2016-05-11 LAB — BASIC METABOLIC PANEL
Anion gap: 8 (ref 5–15)
BUN: 5 mg/dL — AB (ref 6–20)
CHLORIDE: 101 mmol/L (ref 101–111)
CO2: 30 mmol/L (ref 22–32)
CREATININE: 0.64 mg/dL (ref 0.44–1.00)
Calcium: 10.5 mg/dL — ABNORMAL HIGH (ref 8.9–10.3)
GFR calc Af Amer: >60 mL/min (ref >60)
GFR calc non Af Amer: >60 mL/min (ref >60)
GLUCOSE: 83 mg/dL (ref 65–99)
POTASSIUM: 4.2 mmol/L (ref 3.5–5.1)
SODIUM: 139 mmol/L (ref 135–145)

## 2016-05-12 ENCOUNTER — Encounter: Payer: Self-pay | Admitting: Hematology and Oncology

## 2016-05-12 NOTE — Progress Notes (Unsigned)
Patient's daughter stopped me in lobby to ask about financial assistance and if she could make an appointment for her mom. Came to get one of my cards and took to daughter. She also had a form for Dr.Gudnea to sign. Gave form to [employer] manager and put note to return to me. Scheduled appointment for patient Tues 8/8@9am .

## 2016-05-13 ENCOUNTER — Encounter (HOSPITAL_BASED_OUTPATIENT_CLINIC_OR_DEPARTMENT_OTHER): Payer: Self-pay | Admitting: Certified Registered"

## 2016-05-13 ENCOUNTER — Encounter (HOSPITAL_BASED_OUTPATIENT_CLINIC_OR_DEPARTMENT_OTHER)
Admission: RE | Disposition: A | Discharge status: Home / Self Care | Payer: Self-pay | Source: Ambulatory Visit | Attending: General Surgery

## 2016-05-13 ENCOUNTER — Ambulatory Visit (HOSPITAL_BASED_OUTPATIENT_CLINIC_OR_DEPARTMENT_OTHER)
Admission: RE | Admit: 2016-05-13 | Discharge: 2016-05-14 | Disposition: A | Discharge status: Home / Self Care | Payer: BC Managed Care – PPO | Source: Ambulatory Visit | Attending: General Surgery | Admitting: General Surgery

## 2016-05-13 ENCOUNTER — Ambulatory Visit (HOSPITAL_BASED_OUTPATIENT_CLINIC_OR_DEPARTMENT_OTHER): Payer: BC Managed Care – PPO | Admitting: Certified Registered"

## 2016-05-13 ENCOUNTER — Ambulatory Visit
Admission: RE | Admit: 2016-05-13 | Discharge: 2016-05-13 | Disposition: A | Discharge status: Home / Self Care | Payer: BC Managed Care – PPO | Source: Ambulatory Visit | Attending: General Surgery | Admitting: General Surgery

## 2016-05-13 DIAGNOSIS — K219 Gastro-esophageal reflux disease without esophagitis: Secondary | ICD-10-CM | POA: Insufficient documentation

## 2016-05-13 DIAGNOSIS — Z9221 Personal history of antineoplastic chemotherapy: Secondary | ICD-10-CM | POA: Insufficient documentation

## 2016-05-13 DIAGNOSIS — R7303 Prediabetes: Secondary | ICD-10-CM | POA: Diagnosis not present

## 2016-05-13 DIAGNOSIS — Z87440 Personal history of urinary (tract) infections: Secondary | ICD-10-CM | POA: Insufficient documentation

## 2016-05-13 DIAGNOSIS — I10 Essential (primary) hypertension: Secondary | ICD-10-CM | POA: Diagnosis not present

## 2016-05-13 DIAGNOSIS — C50919 Malignant neoplasm of unspecified site of unspecified female breast: Secondary | ICD-10-CM | POA: Diagnosis not present

## 2016-05-13 DIAGNOSIS — C773 Secondary and unspecified malignant neoplasm of axilla and upper limb lymph nodes: Secondary | ICD-10-CM

## 2016-05-13 DIAGNOSIS — B029 Zoster without complications: Secondary | ICD-10-CM

## 2016-05-13 DIAGNOSIS — C50411 Malignant neoplasm of upper-outer quadrant of right female breast: Secondary | ICD-10-CM

## 2016-05-13 HISTORY — PX: RADIOACTIVE SEED GUIDED PARTIAL MASTECTOMY/AXILLARY SENTINEL NODE BIOPSY/AXILLARY NODE DISSECTION: SHX6491

## 2016-05-13 HISTORY — PX: PORT-A-CATH REMOVAL: SHX5289

## 2016-05-13 SURGERY — RADIOACTIVE SEED GUIDED PARTIAL MASTECTOMY WITH AXILLARY SENTINEL LYMPH NODE BIOPSY AND AXILLARY LYMPH NODE DISSECTION
Anesthesia: General | Site: Chest | Laterality: Right

## 2016-05-13 MED ORDER — FENTANYL CITRATE (PF) 100 MCG/2ML IJ SOLN
INTRAMUSCULAR | Status: AC
  Filled 2016-05-13: qty 2

## 2016-05-13 MED ORDER — ACETAMINOPHEN 500 MG PO TABS
ORAL_TABLET | ORAL | Status: AC
  Filled 2016-05-13: qty 2

## 2016-05-13 MED ORDER — KCL IN DEXTROSE-NACL 20-5-0.45 MEQ/L-%-% IV SOLN
INTRAVENOUS | Status: AC
  Administered 2016-05-13: 12:00:00 via INTRAVENOUS
  Filled 2016-05-13: qty 1000

## 2016-05-13 MED ORDER — ONDANSETRON HCL 4 MG/2ML IJ SOLN: 4.0000 mg | Freq: Four times a day (QID) | INTRAMUSCULAR | Status: DC | PRN

## 2016-05-13 MED ORDER — CELECOXIB 200 MG PO CAPS
ORAL_CAPSULE | ORAL | Status: AC
  Filled 2016-05-13: qty 2

## 2016-05-13 MED ORDER — LIDOCAINE 2% (20 MG/ML) 5 ML SYRINGE
INTRAMUSCULAR | Status: AC
  Filled 2016-05-13: qty 5

## 2016-05-13 MED ORDER — GLYCOPYRROLATE 0.2 MG/ML IJ SOLN: 0.2000 mg | Freq: Once | INTRAMUSCULAR | Status: DC | PRN

## 2016-05-13 MED ORDER — LACTATED RINGERS IV SOLN
INTRAVENOUS | Status: DC
  Administered 2016-05-13 (×2): via INTRAVENOUS

## 2016-05-13 MED ORDER — MONTELUKAST SODIUM 10 MG PO TABS: 10.0000 mg | ORAL_TABLET | Freq: Every day | ORAL | Status: DC

## 2016-05-13 MED ORDER — CIPROFLOXACIN IN D5W 400 MG/200ML IV SOLN
400.0000 mg | INTRAVENOUS | Status: AC
  Administered 2016-05-13: 400 mg via INTRAVENOUS

## 2016-05-13 MED ORDER — CLOTRIMAZOLE 1 % EX CREA: TOPICAL_CREAM | Freq: Two times a day (BID) | CUTANEOUS | Status: DC

## 2016-05-13 MED ORDER — FENTANYL CITRATE (PF) 100 MCG/2ML IJ SOLN
50.0000 ug | INTRAMUSCULAR | Status: DC | PRN
  Administered 2016-05-13: 25 ug via INTRAVENOUS
  Administered 2016-05-13: 50 ug via INTRAVENOUS

## 2016-05-13 MED ORDER — PHENYLEPHRINE HCL 10 MG/ML IJ SOLN
INTRAMUSCULAR | Status: DC | PRN
  Administered 2016-05-13: 40 ug via INTRAVENOUS
  Administered 2016-05-13: 80 ug via INTRAVENOUS
  Administered 2016-05-13: 40 ug via INTRAVENOUS

## 2016-05-13 MED ORDER — GABAPENTIN 300 MG PO CAPS: 300.0000 mg | ORAL_CAPSULE | Freq: Three times a day (TID) | ORAL | Status: DC

## 2016-05-13 MED ORDER — LIDOCAINE HCL (CARDIAC) 20 MG/ML IV SOLN
INTRAVENOUS | Status: DC | PRN
  Administered 2016-05-13: 100 mg via INTRAVENOUS

## 2016-05-13 MED ORDER — FENTANYL CITRATE (PF) 100 MCG/2ML IJ SOLN
25.0000 ug | INTRAMUSCULAR | Status: DC | PRN
  Administered 2016-05-13 (×3): 50 ug via INTRAVENOUS

## 2016-05-13 MED ORDER — DIPHENHYDRAMINE HCL 12.5 MG/5ML PO ELIX: 12.5000 mg | ORAL_SOLUTION | Freq: Four times a day (QID) | ORAL | Status: DC | PRN

## 2016-05-13 MED ORDER — CELECOXIB 400 MG PO CAPS
400.0000 mg | ORAL_CAPSULE | ORAL | Status: AC
  Administered 2016-05-13: 400 mg via ORAL

## 2016-05-13 MED ORDER — ATROPINE SULFATE 0.4 MG/ML IJ SOLN
INTRAMUSCULAR | Status: AC
  Filled 2016-05-13: qty 1

## 2016-05-13 MED ORDER — PROPOFOL 500 MG/50ML IV EMUL
INTRAVENOUS | Status: AC
  Filled 2016-05-13: qty 50

## 2016-05-13 MED ORDER — PROPOFOL 10 MG/ML IV BOLUS
INTRAVENOUS | Status: DC | PRN
  Administered 2016-05-13: 120 mg via INTRAVENOUS

## 2016-05-13 MED ORDER — PROMETHAZINE HCL 12.5 MG PO TABS: 12.5000 mg | ORAL_TABLET | Freq: Four times a day (QID) | ORAL | Status: DC | PRN

## 2016-05-13 MED ORDER — BUPIVACAINE-EPINEPHRINE (PF) 0.5% -1:200000 IJ SOLN
INTRAMUSCULAR | Status: DC | PRN
  Administered 2016-05-13: 25 mL

## 2016-05-13 MED ORDER — DEXAMETHASONE SODIUM PHOSPHATE 10 MG/ML IJ SOLN
INTRAMUSCULAR | Status: AC
  Filled 2016-05-13: qty 1

## 2016-05-13 MED ORDER — ACETAMINOPHEN 650 MG RE SUPP: 650.0000 mg | Freq: Four times a day (QID) | RECTAL | Status: DC | PRN

## 2016-05-13 MED ORDER — OXYCODONE-ACETAMINOPHEN 5-325 MG PO TABS: 2.0000 | ORAL_TABLET | Freq: Three times a day (TID) | ORAL | Status: DC | PRN

## 2016-05-13 MED ORDER — SENNA 8.6 MG PO TABS
1.0000 | ORAL_TABLET | Freq: Two times a day (BID) | ORAL | Status: DC
  Administered 2016-05-13: 8.6 mg via ORAL
  Filled 2016-05-13: qty 1

## 2016-05-13 MED ORDER — SIMETHICONE 80 MG PO CHEW: 40.0000 mg | CHEWABLE_TABLET | Freq: Four times a day (QID) | ORAL | Status: DC | PRN

## 2016-05-13 MED ORDER — ONDANSETRON 4 MG PO TBDP: 4.0000 mg | ORAL_TABLET | Freq: Four times a day (QID) | ORAL | Status: DC | PRN

## 2016-05-13 MED ORDER — LIDOCAINE HCL 1 % IJ SOLN
INTRAMUSCULAR | Status: DC | PRN
  Administered 2016-05-13: 10 mL

## 2016-05-13 MED ORDER — GABAPENTIN 300 MG PO CAPS
300.0000 mg | ORAL_CAPSULE | ORAL | Status: AC
  Administered 2016-05-13: 200 mg via ORAL

## 2016-05-13 MED ORDER — DILTIAZEM HCL ER COATED BEADS 120 MG PO CP24: 120.0000 mg | ORAL_CAPSULE | Freq: Every day | ORAL | Status: DC

## 2016-05-13 MED ORDER — ONDANSETRON HCL 4 MG/2ML IJ SOLN: 4.0000 mg | Freq: Once | INTRAMUSCULAR | Status: DC | PRN

## 2016-05-13 MED ORDER — MORPHINE SULFATE (PF) 2 MG/ML IV SOLN: 1.0000 mg | INTRAVENOUS | Status: DC | PRN

## 2016-05-13 MED ORDER — CHLORHEXIDINE GLUCONATE CLOTH 2 % EX PADS: 6.0000 | MEDICATED_PAD | Freq: Once | CUTANEOUS | Status: DC

## 2016-05-13 MED ORDER — HYDROCODONE-ACETAMINOPHEN 5-325 MG PO TABS
1.0000 | ORAL_TABLET | ORAL | Status: DC | PRN
  Administered 2016-05-13 (×2): 1 via ORAL
  Filled 2016-05-13 (×2): qty 1

## 2016-05-13 MED ORDER — ONDANSETRON HCL 8 MG PO TABS: 8.0000 mg | ORAL_TABLET | Freq: Three times a day (TID) | ORAL | Status: DC | PRN

## 2016-05-13 MED ORDER — MIDAZOLAM HCL 2 MG/2ML IJ SOLN
INTRAMUSCULAR | Status: AC
  Filled 2016-05-13: qty 2

## 2016-05-13 MED ORDER — OXYCODONE-ACETAMINOPHEN 5-325 MG PO TABS: 1.0000 | ORAL_TABLET | Freq: Four times a day (QID) | ORAL | 0 refills | Status: DC | PRN

## 2016-05-13 MED ORDER — ACETAMINOPHEN 500 MG PO TABS
1000.0000 mg | ORAL_TABLET | ORAL | Status: AC
  Administered 2016-05-13: 1000 mg via ORAL

## 2016-05-13 MED ORDER — OLOPATADINE HCL 0.7 % OP SOLN: 1.0000 [drp] | Freq: Every day | OPHTHALMIC | Status: DC | PRN

## 2016-05-13 MED ORDER — PHENYLEPHRINE HCL 10 MG/ML IJ SOLN
INTRAVENOUS | Status: DC | PRN
  Administered 2016-05-13: 50 ug/min via INTRAVENOUS

## 2016-05-13 MED ORDER — CIPROFLOXACIN IN D5W 400 MG/200ML IV SOLN
400.0000 mg | Freq: Two times a day (BID) | INTRAVENOUS | Status: AC
  Administered 2016-05-13: 400 mg via INTRAVENOUS
  Filled 2016-05-13: qty 200

## 2016-05-13 MED ORDER — GABAPENTIN 100 MG PO CAPS
ORAL_CAPSULE | ORAL | Status: AC
  Filled 2016-05-13: qty 2

## 2016-05-13 MED ORDER — ZOLPIDEM TARTRATE 5 MG PO TABS
5.0000 mg | ORAL_TABLET | Freq: Every evening | ORAL | Status: DC | PRN
  Administered 2016-05-13: 5 mg via ORAL
  Filled 2016-05-13: qty 1

## 2016-05-13 MED ORDER — ACETAMINOPHEN 325 MG PO TABS: 650.0000 mg | ORAL_TABLET | Freq: Four times a day (QID) | ORAL | Status: DC | PRN

## 2016-05-13 MED ORDER — ATENOLOL 100 MG PO TABS: 100.0000 mg | ORAL_TABLET | Freq: Every day | ORAL | Status: DC

## 2016-05-13 MED ORDER — TRIAMTERENE-HCTZ 37.5-25 MG PO TABS: 1.0000 | ORAL_TABLET | Freq: Every day | ORAL | Status: DC

## 2016-05-13 MED ORDER — KCL IN DEXTROSE-NACL 20-5-0.45 MEQ/L-%-% IV SOLN: INTRAVENOUS | Status: AC

## 2016-05-13 MED ORDER — PROCHLORPERAZINE MALEATE 10 MG PO TABS: 10.0000 mg | ORAL_TABLET | Freq: Four times a day (QID) | ORAL | Status: DC | PRN

## 2016-05-13 MED ORDER — DIPHENHYDRAMINE HCL 50 MG/ML IJ SOLN: 12.5000 mg | Freq: Four times a day (QID) | INTRAMUSCULAR | Status: DC | PRN

## 2016-05-13 MED ORDER — ONDANSETRON HCL 4 MG/2ML IJ SOLN
INTRAMUSCULAR | Status: DC | PRN
  Administered 2016-05-13: 4 mg via INTRAVENOUS

## 2016-05-13 MED ORDER — ONDANSETRON HCL 4 MG/2ML IJ SOLN
INTRAMUSCULAR | Status: AC
  Filled 2016-05-13: qty 2

## 2016-05-13 MED ORDER — POTASSIUM CHLORIDE CRYS ER 20 MEQ PO TBCR: 20.0000 meq | EXTENDED_RELEASE_TABLET | Freq: Two times a day (BID) | ORAL | Status: DC

## 2016-05-13 MED ORDER — CIPROFLOXACIN IN D5W 400 MG/200ML IV SOLN
INTRAVENOUS | Status: AC
  Filled 2016-05-13: qty 200

## 2016-05-13 MED ORDER — DEXAMETHASONE SODIUM PHOSPHATE 4 MG/ML IJ SOLN
INTRAMUSCULAR | Status: DC | PRN
  Administered 2016-05-13: 10 mg via INTRAVENOUS

## 2016-05-13 MED ORDER — BUPIVACAINE-EPINEPHRINE (PF) 0.25% -1:200000 IJ SOLN
INTRAMUSCULAR | Status: DC | PRN
  Administered 2016-05-13: 10 mL

## 2016-05-13 MED ORDER — SCOPOLAMINE 1 MG/3DAYS TD PT72: 1.0000 | MEDICATED_PATCH | Freq: Once | TRANSDERMAL | Status: DC | PRN

## 2016-05-13 MED ORDER — MIDAZOLAM HCL 2 MG/2ML IJ SOLN
1.0000 mg | INTRAMUSCULAR | Status: DC | PRN
  Administered 2016-05-13: 1 mg via INTRAVENOUS

## 2016-05-13 MED ORDER — METHOCARBAMOL 500 MG PO TABS: 500.0000 mg | ORAL_TABLET | Freq: Four times a day (QID) | ORAL | Status: DC | PRN

## 2016-05-13 SURGICAL SUPPLY — 68 items
ADH SKN CLS APL DERMABOND .7 (GAUZE/BANDAGES/DRESSINGS) ×4
BINDER BREAST LRG (GAUZE/BANDAGES/DRESSINGS) IMPLANT
BINDER BREAST XLRG (GAUZE/BANDAGES/DRESSINGS) IMPLANT
BINDER BREAST XXLRG (GAUZE/BANDAGES/DRESSINGS) IMPLANT
BIOPATCH RED 1 DISK 7.0 (GAUZE/BANDAGES/DRESSINGS) ×1 IMPLANT
BIOPATCH RED 1IN DISK 7.0MM (GAUZE/BANDAGES/DRESSINGS) ×1
BLADE HEX COATED 2.75 (ELECTRODE) ×4 IMPLANT
BLADE SURG 10 STRL SS (BLADE) ×2 IMPLANT
BLADE SURG 15 STRL LF DISP TIS (BLADE) ×2 IMPLANT
BLADE SURG 15 STRL SS (BLADE) ×4
BNDG COHESIVE 4X5 TAN STRL (GAUZE/BANDAGES/DRESSINGS) ×4 IMPLANT
CANISTER SUCT 1200ML W/VALVE (MISCELLANEOUS) ×4 IMPLANT
CHLORAPREP W/TINT 26ML (MISCELLANEOUS) ×4 IMPLANT
CLIP TI LARGE 6 (CLIP) ×2 IMPLANT
CLIP TI MEDIUM 6 (CLIP) ×6 IMPLANT
CLOSURE WOUND 1/2 X4 (GAUZE/BANDAGES/DRESSINGS) ×1
COVER BACK TABLE 60X90IN (DRAPES) ×4 IMPLANT
COVER MAYO STAND STRL (DRAPES) ×4 IMPLANT
COVER PROBE W GEL 5X96 (DRAPES) ×4 IMPLANT
DERMABOND ADVANCED (GAUZE/BANDAGES/DRESSINGS) ×4
DERMABOND ADVANCED .7 DNX12 (GAUZE/BANDAGES/DRESSINGS) IMPLANT
DEVICE DUBIN W/COMP PLATE 8390 (MISCELLANEOUS) ×4 IMPLANT
DRAIN CHANNEL 19F RND (DRAIN) ×2 IMPLANT
DRAPE UTILITY XL STRL (DRAPES) ×4 IMPLANT
DRSG PAD ABDOMINAL 8X10 ST (GAUZE/BANDAGES/DRESSINGS) ×2 IMPLANT
DRSG TEGADERM 2-3/8X2-3/4 SM (GAUZE/BANDAGES/DRESSINGS) ×4 IMPLANT
ELECT REM PT RETURN 9FT ADLT (ELECTROSURGICAL) ×4
ELECTRODE REM PT RTRN 9FT ADLT (ELECTROSURGICAL) ×2 IMPLANT
EVACUATOR SILICONE 100CC (DRAIN) ×2 IMPLANT
GAUZE SPONGE 4X4 12PLY STRL (GAUZE/BANDAGES/DRESSINGS) ×6 IMPLANT
GLOVE BIO SURGEON STRL SZ 6 (GLOVE) ×4 IMPLANT
GLOVE BIOGEL PI IND STRL 6.5 (GLOVE) ×2 IMPLANT
GLOVE BIOGEL PI IND STRL 7.0 (GLOVE) IMPLANT
GLOVE BIOGEL PI INDICATOR 6.5 (GLOVE) ×2
GLOVE BIOGEL PI INDICATOR 7.0 (GLOVE) ×4
GLOVE ECLIPSE 6.5 STRL STRAW (GLOVE) ×2 IMPLANT
GOWN STRL REUS W/ TWL LRG LVL3 (GOWN DISPOSABLE) ×2 IMPLANT
GOWN STRL REUS W/TWL 2XL LVL3 (GOWN DISPOSABLE) ×4 IMPLANT
GOWN STRL REUS W/TWL LRG LVL3 (GOWN DISPOSABLE) ×4
ILLUMINATOR WAVEGUIDE N/F (MISCELLANEOUS) ×2 IMPLANT
KIT MARKER MARGIN INK (KITS) ×4 IMPLANT
LIQUID BAND (GAUZE/BANDAGES/DRESSINGS) ×4 IMPLANT
NDL HYPO 25X1 1.5 SAFETY (NEEDLE) ×4 IMPLANT
NEEDLE HYPO 25X1 1.5 SAFETY (NEEDLE) ×4 IMPLANT
NS IRRIG 1000ML POUR BTL (IV SOLUTION) ×4 IMPLANT
PACK BASIN DAY SURGERY FS (CUSTOM PROCEDURE TRAY) ×4 IMPLANT
PACK UNIVERSAL I (CUSTOM PROCEDURE TRAY) ×4 IMPLANT
PENCIL BUTTON HOLSTER BLD 10FT (ELECTRODE) ×4 IMPLANT
PIN SAFETY STERILE (MISCELLANEOUS) ×2 IMPLANT
SLEEVE SCD COMPRESS KNEE MED (MISCELLANEOUS) ×4 IMPLANT
SPONGE LAP 18X18 X RAY DECT (DISPOSABLE) ×6 IMPLANT
STOCKINETTE IMPERVIOUS LG (DRAPES) ×4 IMPLANT
STRIP CLOSURE SKIN 1/2X4 (GAUZE/BANDAGES/DRESSINGS) ×3 IMPLANT
SUT ETHILON 3 0 PS 1 (SUTURE) ×4 IMPLANT
SUT MNCRL AB 4-0 PS2 18 (SUTURE) ×8 IMPLANT
SUT VIC AB 2-0 SH 18 (SUTURE) ×4 IMPLANT
SUT VIC AB 2-0 SH 27 (SUTURE) ×4
SUT VIC AB 2-0 SH 27XBRD (SUTURE) ×2 IMPLANT
SUT VIC AB 3-0 SH 27 (SUTURE) ×4
SUT VIC AB 3-0 SH 27X BRD (SUTURE) ×2 IMPLANT
SUT VICRYL 3-0 CR8 SH (SUTURE) ×4 IMPLANT
SYR BULB 3OZ (MISCELLANEOUS) ×4 IMPLANT
SYR CONTROL 10ML LL (SYRINGE) ×6 IMPLANT
TOWEL OR 17X24 6PK STRL BLUE (TOWEL DISPOSABLE) ×4 IMPLANT
TOWEL OR NON WOVEN STRL DISP B (DISPOSABLE) ×4 IMPLANT
TUBE CONNECTING 20'X1/4 (TUBING) ×1
TUBE CONNECTING 20X1/4 (TUBING) ×3 IMPLANT
YANKAUER SUCT BULB TIP NO VENT (SUCTIONS) ×4 IMPLANT

## 2016-05-13 NOTE — Op Note (Addendum)
Right Breast Radioactive seed localized lumpectomy, axillary lymph node dissection, and port removal  Indications: This patient presents with history of right breast cancer, cT2N1, s/p neoadjuvant chemotherapy.  Pre-operative Diagnosis: See above  Post-operative Diagnosis: Same  Surgeon: Stark Klein   Anesthesia: General endotracheal anesthesia  ASA Class: 3  Procedure Details  The patient was seen in the Holding Room. The risks, benefits, complications, treatment options, and expected outcomes were discussed with the patient. The possibilities of bleeding, infection, the need for additional procedures, failure to diagnose a condition, and creating a complication requiring transfusion or operation were discussed with the patient. The patient concurred with the proposed plan, giving informed consent.  The site of surgery properly noted/marked. The patient was taken to Operating Room # 7, identified, and the procedure verified as Right Breast Seed localized Lumpectomy, axillary lymph node dissection, and port removal. A Time Out was held and the above information confirmed.  The right arm, breast, and bilateral chest were prepped and draped in standard fashion. The prior port incision was anesthetized with local anesthetic.  The incision was opened with a #15 blade.  The subcutaneous tissue was divided with the cautery.  The port was identified and the capsule opened.  The four 2-0 prolene sutures were removed.  The port was then removed and pressure held on the tract.  The catheter appeared intact without evidence of breakage, length was 21.5 cm.  The wound was inspected for hemostasis, which was achieved with cautery.  The wound was closed with 3-0 vicryl deep dermal interrupted sutures and 4-0 Monocryl running subcuticular suture.    The lumpectomy was performed by creating an transverse incision over the lateral right quadrant of the breast over the previously placed radioactive seed.   Dissection was carried down to around the point of maximum signal intensity. The cautery was used to perform the dissection.  Hemostasis was achieved with cautery. The edges of the cavity were marked with large clips, with one each medial, lateral, inferior and superior, and two clips posteriorly.   The specimen was inked with the margin marker paint kit.    Specimen radiography confirmed inclusion of the mammographic lesion, the clip, and the seed.  The background signal in the breast was zero.  The wound was irrigated and closed with 3-0 vicryl in layers and 4-0 monocryl subcuticular suture.     An curvalinear incision was made in the axilla. An axillary dissection was performed with removal of the associated lymph nodes and surrounding adipose tissue. This included levels I and II. This was accomplished by exposing the axillary vein anteriorly and inferiorly to the level of the pectoralis minor and laterally over the latissimus dorsi muscle. Posteriorly, the dissection continued to the subscapularis.  Small venous tributaries, lymphatics, and vessels were clipped and ligated or cauterized and divided. The subscapularis muscle was skeletonized. The long thoracic and thoracodorsal neurovascular bundles were identified and preserved.  The wound was irrigated and closed with a 3-0 Vicryl deep dermal interrupted and a 4-0 vicryl subcuticular closure in layers.    Sterile dressings were applied. At the end of the operation, all sponge, instrument, and needle counts were correct.  Findings: grossly clear surgical margins and gross adenopathy.  Posterior margin is muscle.    Estimated Blood Loss:  min         Specimens: right breast lumpectomy and right axillary contents.         Complications:  None; patient tolerated the procedure well.  Disposition: PACU - hemodynamically stable.         Condition: stable  

## 2016-05-13 NOTE — Interval H&P Note (Signed)
History and Physical Interval Note:  05/13/2016 7:31 AM  [name]  has presented today for surgery, with the diagnosis of RIGHT BREAST CANCER  The various methods of treatment have been discussed with the patient and family. After consideration of risks, benefits and other options for treatment, the patient has consented to  Procedure(s): RIGHT BREAST SEED LOCALIZED LUMPECTOMY WITH AXILLARY LYMPH NODE DISSECTION (Right) REMOVAL PORT-A-CATH (N/A) as a surgical intervention .  The patient's history has been reviewed, patient examined, no change in status, stable for surgery.  I have reviewed the patient's chart and labs.  Questions were answered to the patient's satisfaction.     [name]

## 2016-05-13 NOTE — Anesthesia Procedure Notes (Signed)
Procedure Name: LMA Insertion Date/Time: 05/13/2016 7:44 AM Performed by: Baxter Flattery Pre-anesthesia Checklist: Patient identified, Emergency Drugs available, Suction available and Patient being monitored Patient Re-evaluated:Patient Re-evaluated prior to inductionOxygen Delivery Method: Circle system utilized Preoxygenation: Pre-oxygenation with 100% oxygen Intubation Type: IV induction Ventilation: Mask ventilation without difficulty LMA: LMA inserted LMA Size: 4.0 Number of attempts: 1 Airway Equipment and Method: Bite block Placement Confirmation: positive ETCO2 and breath sounds checked- equal and bilateral Tube secured with: Tape Dental Injury: Teeth and Oropharynx as per pre-operative assessment

## 2016-05-13 NOTE — Anesthesia Procedure Notes (Signed)
Anesthesia Regional Block:  Pectoralis block  Pre-Anesthetic Checklist: ,, timeout performed, Correct Patient, Correct Site, Correct Laterality, Correct Procedure, Correct Position, site marked, Risks and benefits discussed,  Surgical consent,  Pre-op evaluation,  At surgeon's request and post-op pain management  Laterality: Right  Prep: Maximum Sterile Barrier Precautions used, chloraprep       Needles:  Injection technique: Single-shot  Needle Type: Echogenic Stimulator Needle     Needle Length: 10cm 10 cm Needle Gauge: 21 G    Additional Needles:  Procedures: ultrasound guided (picture in chart) and nerve stimulator Pectoralis block Narrative:  Injection made incrementally with aspirations every 5 mL.  Performed by: Personally  Anesthesiologist: Yahya Boldman, Stanton Kidney  Additional Notes: Patient tolerated the procedure well without complications

## 2016-05-13 NOTE — Transfer of Care (Signed)
Immediate Anesthesia Transfer of Care Note  Patient: [name]  Procedure(s) Performed: Procedure(s): RIGHT BREAST SEED LOCALIZED LUMPECTOMY WITH AXILLARY LYMPH NODE DISSECTION (Right) REMOVAL PORT-A-CATH (Left)  Patient Location: PACU  Anesthesia Type:General  Level of Consciousness: awake, sedated and responds to stimulation  Airway & Oxygen Therapy: Patient Spontanous Breathing and Patient connected to face mask oxygen  Post-op Assessment: Report given to RN, Post -op Vital signs reviewed and stable and Patient moving all extremities  Post vital signs: Reviewed and stable  Last Vitals:  Vitals:   05/13/16 0639  Pulse: 65  Resp: 16  Temp: 37 C    Last Pain:  Vitals:   05/13/16 0639  TempSrc: Oral  PainSc: 1          Complications: No apparent anesthesia complications

## 2016-05-13 NOTE — Anesthesia Preprocedure Evaluation (Signed)
Anesthesia Evaluation  Patient identified by MRN, date of birth, ID band Patient awake    Reviewed: Allergy & Precautions, NPO status , Patient's Chart, lab work & pertinent test results  History of Anesthesia Complications (+) PONVNegative for: history of anesthetic complications  Airway Mallampati: II  TM Distance: >3 FB Neck ROM: Full    Dental no notable dental hx. (+) Dental Advisory Given   Pulmonary neg pulmonary ROS,    Pulmonary exam normal breath sounds clear to auscultation       Cardiovascular hypertension, Normal cardiovascular exam+ dysrhythmias Atrial Fibrillation  Rhythm:Regular Rate:Normal  Echo 2017: Left ventricle: The cavity size was normal. Wall thickness was   normal. Systolic function was normal. The estimated ejection   fraction was in the range of 60% to 65%. Wall motion was normal;   there were no regional wall motion abnormalities. Doppler   parameters are consistent with abnormal left ventricular   relaxation (grade 1 diastolic dysfunction). - Impressions: Lateral s&' = 11.6 cm/sec. GLS - 16.4%   (underestimated due to poor endocardial tracking).    Neuro/Psych negative neurological ROS  negative psych ROS   GI/Hepatic Neg liver ROS, GERD  Medicated and Controlled,  Endo/Other  negative endocrine ROS  Renal/GU negative Renal ROS  negative genitourinary   Musculoskeletal negative musculoskeletal ROS (+)   Abdominal   Peds negative pediatric ROS (+)  Hematology  (+) anemia ,   Anesthesia Other Findings   Reproductive/Obstetrics negative OB ROS                             Anesthesia Physical Anesthesia Plan  ASA: III  Anesthesia Plan: General   Post-op Pain Management: GA combined w/ Regional for post-op pain   Induction: Intravenous  Airway Management Planned: LMA  Additional Equipment:   Intra-op Plan:   Post-operative Plan: Extubation in  OR  Informed Consent: I have reviewed the patients History and Physical, chart, labs and discussed the procedure including the risks, benefits and alternatives for the proposed anesthesia with the patient or authorized representative who has indicated his/her understanding and acceptance.   Dental advisory given  Plan Discussed with: CRNA  Anesthesia Plan Comments:         Anesthesia Quick Evaluation

## 2016-05-13 NOTE — Progress Notes (Signed)
Assisted Dr. Lauretta Grill with right, ultrasound guided, pectoralis block. Side rails up, monitors on throughout procedure. See vital signs in flow sheet. Tolerated Procedure well.

## 2016-05-13 NOTE — Anesthesia Postprocedure Evaluation (Signed)
Anesthesia Post Note  Patient: [name]  Procedure(s) Performed: Procedure(s) (LRB): RIGHT BREAST SEED LOCALIZED LUMPECTOMY WITH AXILLARY LYMPH NODE DISSECTION (Right) REMOVAL PORT-A-CATH (Left)  Anesthesia Post Evaluation  Last Vitals:  Vitals:   05/13/16 1000 05/13/16 1015  BP: 131/76 125/79  Pulse: (!) 58 (!) 59  Resp: 16 15  Temp:      Last Pain:  Vitals:   05/13/16 1015  TempSrc:   PainSc: 8                  Lynk Marti JENNETTE

## 2016-05-13 NOTE — Discharge Instructions (Signed)
About my Jackson-Pratt Bulb Drain  What is a Jackson-Pratt bulb? A Jackson-Pratt is a soft, round device used to collect drainage. It is connected to a long, thin drainage catheter, which is held in place by one or two small stiches near your surgical incision site. When the bulb is squeezed, it forms a vacuum, forcing the drainage to empty into the bulb.  Emptying the Jackson-Pratt bulb- To empty the bulb: 1. Release the plug on the top of the bulb. 2. Pour the bulb's contents into a measuring container which your nurse will provide. 3. Record the time emptied and amount of drainage. Empty the drain(s) as often as your     doctor or nurse recommends.  Date                  Time                    Amount (Drain 1)                 Amount (Drain 2)  _____________________________________________________________________  _____________________________________________________________________  _____________________________________________________________________  _____________________________________________________________________  _____________________________________________________________________  _____________________________________________________________________  _____________________________________________________________________  _____________________________________________________________________  Squeezing the Jackson-Pratt Bulb- To squeeze the bulb: 1. Make sure the plug at the top of the bulb is open. 2. Squeeze the bulb tightly in your fist. You will hear air squeezing from the bulb. 3. Replace the plug while the bulb is squeezed. 4. Use a safety pin to attach the bulb to your clothing. This will keep the catheter from     pulling at the bulb insertion site.  When to call your doctor- Call your doctor if:  Drain site becomes red, swollen or hot.  You have a fever greater than 101 degrees F.  There is oozing at the drain site.  Drain falls out (apply a guaze  bandage over the drain hole and secure it with tape).  Drainage increases daily not related to activity patterns. (You will usually have more drainage when you are active than when you are resting.)  Drainage has a bad odor.  SACRAL DRESSING (Lower Back)   A pressure ulcer is a sore where the skin breaks open   This dressing will be placed on your lower back to protect this area from pressure and moisture and in many cases helps prevent pressure ulcers from forming   A nurse may place this dressing before your surgery or another procedure   A nurse may also place this dressing if you have other conditions that put you at risk for developing a pressure ulcer   If you are getting up and moving around after surgery, the dressing may be taken off with your first shower. Simply remove it and throw it away.   While you are in the hospital, nurses will change the dressing twice a week as long as you are still at risk for developing a pressure ulcer   This dressing is latex free and made with silicone (for adhesive sensitivity) so it is safe and gentle to the skin

## 2016-05-13 NOTE — H&P (Signed)
[name] 04/16/2016 9:35 AM Location: Time Surgery Patient #: [id number] DOB: March 31, 1956 Widowed / Language: [place] / Race: Black or African American Female   History of Present Illness Audrey Klein MD; 04/16/2016 2:01 PM) The patient is a 60 year old female who presents with breast cancer. PRIOR HISTORY [name] is a 60 yo F who presented with a palpable right breast mass x 2 weeks. She is seen at the request of Dr. Melford Aase for consultation of this diagnosis. She had a prior excisional biopsy in 2000 that was benign. She has family history in a maternal aunt. The mass was seen to be 2.8 cm at 10 o'clock on dx mammogram/ultrasound. 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 Pt denies prior breast cancer or breast pain. She does experience bilateral breast soreness occasionally.   Menarche was age 74, She is a G2P2 wtih first child in late teens. Menopause was early, <40 yo.   She has atrial fibrillation with RVR and is on Xarelto at the direction of Dr. Ena Dawley of cardiology. She also has a history of eczema and frequent UTIs. ]  She has had a very difficult time with chemotherapy and has had to be hospitalized. She has had improvement of her mass clinically. She denies breast pain. She got shingles on the opposite side of her breast cancer. She still has some open wounds on that side.    Treatment team Dr. Lindi Adie Dr. Lisbeth Renshaw Dr. Luberta Robertson, [facility] [place] Dr. Melford Aase, PCP Dr. Liane Comber cardiology Dr. Danella Sensing     Allergies Elbert Ewings, CMA; 04/16/2016 9:35 AM) No Known Drug Allergies06/30/2017  Medication History Elbert Ewings, CMA; 04/16/2016 9:41 AM) Atenolol (100MG  Tablet, Oral) Active. No Current Medications (Taken starting 04/16/2016) Cholecalciferol (4000UNIT Tablet, Oral) Active. DiazePAM (5MG  Tablet, Oral) Active. Ferrous Sulfate  (325 (65 Fe)MG Tablet, Oral) Active. Nystatin (100000 UNIT/ML Suspension, Mouth/Throat) Active. Ondansetron HCl (8MG  Tablet, Oral) Active. Promethazine HCl (12.5MG  Tablet, Oral two times daily) Active. Potassium Chloride (20MEQ Tablet ER, Oral) Active. Oxycodone-Acetaminophen (5-325MG  Tablet, Oral) Active. ValACYclovir HCl (1GM Tablet, Oral) Active. Gabapentin (100MG  Capsule, Oral three times daily) Active. Atorvastatin Calcium (80MG  Tablet, Oral) Active. Triamcinolone Acetonide (0.1% Cream, External) Active. Triamterene-HCTZ (37.5-25MG  Tablet, Oral) Active. LORazepam (0.5MG  Tablet, Oral every six hours) Active. Prochlorperazine Maleate (10MG  Tablet, Oral) Active. Xarelto (20MG  Tablet, Oral) Active. Montelukast Sodium (10MG  Tablet, Oral) Active. Pazeo (0.7% Solution, Ophthalmic) Active. DiltiaZEM HCl ER Coated Beads (120MG  Capsule ER 24HR, Oral) Active. Doxepin HCl (50MG  Capsule, Oral) Active. Multiple Vitamin (Oral) Active. Medications Reconciled    Review of Systems Audrey Klein MD; 04/16/2016 2:01 PM) All other systems negative  Vitals Elbert Ewings CMA; 04/16/2016 9:41 AM) 04/16/2016 9:41 AM Weight: 173.2 lb Height: 62in Body Surface Area: 1.8 m Body Mass Index: 31.68 kg/m  Temp.: 98.76F(Temporal)  Pulse: 75 (Regular)  BP: 130/70 (Sitting, Left Arm, Standard)       Physical Exam Audrey Klein MD; 04/16/2016 2:02 PM) Integumentary Note: Lateral left breast and lateral back with wounds that are partially epithelialized. No evidence of infection.   Chest and Lung Exam Chest and lung exam reveals -quiet, even and easy respiratory effort with no use of accessory muscles. Inspection Chest Wall - Normal. Back - normal.  Breast Note: Still had a palpable mass lateral in the breast at 10:00. Question palpable lymphadenopathy. There is no nipple retraction or skin dimpling. Left side is without masses.     Assessment &  Plan Audrey Klein MD; 04/16/2016 2:04 PM) TRIPLE NEGATIVE MALIGNANT NEOPLASM OF BREAST (C50.919) Impression: The patient has had significant difficulty with chemotherapy. However, I think she is able to undergo surgery. I would plan to do a right needle localized lumpectomy with axillary lymph node dissection. I advised the patient of this course of action. I do not think we have to wait till her shingles is totally healed since she has no symptoms on the opposite side.  I discussed that she will need to stay overnight in the hospital for drain teaching and drain care. I discussed postoperative restrictions and expectation for exercise postoperatively.  The surgical procedure was described to the patient. I discussed the incision type and location and that we would need radiology involved on with a wire or seed marker and/or sentinel node.  The risks and benefits of the procedure were described to the patient and she wishes to proceed.  We discussed the risks bleeding, infection, damage to other structures, need for further procedures/surgeries. We discussed the risk of seroma. The patient was advised if the area in the breast in cancer, we may need to go back to surgery for additional tissue to obtain negative margins or for a lymph node biopsy. The patient was advised that these are the most common complications, but that others can occur as well. They were advised against taking aspirin or other anti-inflammatory agents/blood thinners the week before surgery.  30 min spent in evaluation, examination, counseling, and coordination of care. >50% spent in counseling. Current Plans Pt Education - [facility] breast cancer surgery: discussed with patient and provided information.   Signed by Audrey Klein, MD (04/16/2016 2:04 PM)

## 2016-05-16 ENCOUNTER — Emergency Department (HOSPITAL_COMMUNITY): Payer: BC Managed Care – PPO

## 2016-05-16 ENCOUNTER — Encounter (HOSPITAL_COMMUNITY): Payer: Self-pay

## 2016-05-16 ENCOUNTER — Emergency Department (HOSPITAL_COMMUNITY)
Admission: EM | Admit: 2016-05-16 | Discharge: 2016-05-17 | Disposition: A | Discharge status: Home / Self Care | Payer: BC Managed Care – PPO | Attending: Emergency Medicine | Admitting: Emergency Medicine

## 2016-05-16 DIAGNOSIS — Z853 Personal history of malignant neoplasm of breast: Secondary | ICD-10-CM | POA: Diagnosis not present

## 2016-05-16 DIAGNOSIS — Z7901 Long term (current) use of anticoagulants: Secondary | ICD-10-CM | POA: Diagnosis not present

## 2016-05-16 DIAGNOSIS — Z79899 Other long term (current) drug therapy: Secondary | ICD-10-CM | POA: Diagnosis not present

## 2016-05-16 DIAGNOSIS — R079 Chest pain, unspecified: Secondary | ICD-10-CM | POA: Diagnosis present

## 2016-05-16 DIAGNOSIS — I10 Essential (primary) hypertension: Secondary | ICD-10-CM | POA: Insufficient documentation

## 2016-05-16 LAB — URINALYSIS, ROUTINE W REFLEX MICROSCOPIC
Bilirubin Urine: NEGATIVE
GLUCOSE, UA: NEGATIVE mg/dL
KETONES UR: NEGATIVE mg/dL
NITRITE: NEGATIVE
PROTEIN: NEGATIVE mg/dL
SPECIFIC GRAVITY, URINE: 1.017 (ref 1.005–1.030)
pH: 6.5 (ref 5.0–8.0)

## 2016-05-16 LAB — COMPREHENSIVE METABOLIC PANEL
ALBUMIN: 3.4 g/dL — AB (ref 3.5–5.0)
ALT: 16 U/L (ref 14–54)
AST: 24 U/L (ref 15–41)
Alkaline Phosphatase: 72 U/L (ref 38–126)
Anion gap: 9 (ref 5–15)
CHLORIDE: 104 mmol/L (ref 101–111)
CO2: 26 mmol/L (ref 22–32)
CREATININE: 0.9 mg/dL (ref 0.44–1.00)
Calcium: 9.8 mg/dL (ref 8.9–10.3)
GFR calc non Af Amer: >60 mL/min (ref >60)
GLUCOSE: 115 mg/dL — AB (ref 65–99)
Potassium: 4 mmol/L (ref 3.5–5.1)
SODIUM: 139 mmol/L (ref 135–145)
Total Bilirubin: 0.3 mg/dL (ref 0.3–1.2)
Total Protein: 5.8 g/dL — ABNORMAL LOW (ref 6.5–8.1)

## 2016-05-16 LAB — URINE MICROSCOPIC-ADD ON

## 2016-05-16 LAB — I-STAT TROPONIN, ED: Troponin i, poc: 0.03 ng/mL (ref 0.00–0.08)

## 2016-05-16 MED ORDER — ACETAMINOPHEN 500 MG PO TABS
1000.0000 mg | ORAL_TABLET | Freq: Once | ORAL | Status: AC
  Administered 2016-05-16: 1000 mg via ORAL
  Filled 2016-05-16: qty 2

## 2016-05-16 MED ORDER — IOPAMIDOL (ISOVUE-370) INJECTION 76%
INTRAVENOUS | Status: AC
  Filled 2016-05-16: qty 100

## 2016-05-16 NOTE — ED Triage Notes (Signed)
Pt with JP drain to R side. Drain to suction with serous drainage.

## 2016-05-16 NOTE — ED Provider Notes (Signed)
[facility] DEPT Provider Note   CSN: [id number] Arrival date & time: 05/16/16  1805  First Provider Contact:  First MD Initiated Contact with Patient 05/16/16 2135      History   Chief Complaint Chest pain, fever  HPI [name] is a 60 y.o. female.  Fever   This is a new problem. The current episode started 6 to 12 hours ago. The problem occurs rarely. The problem has been resolved. The maximum temperature noted was 101 to 101.9 F. The temperature was taken using an oral thermometer. Associated symptoms include chest pain. Pertinent negatives include no fussiness, no sleepiness, no diarrhea, no vomiting, no congestion, no headaches, no sore throat, no tugging at ear, no muscle aches and no cough. She has tried nothing for the symptoms. The treatment provided no relief.    Past Medical History:  Diagnosis Date  . Anemia   . Atrial fibrillation ([place]) 2016  . Breast cancer ([place])   . Breast cancer of upper-outer quadrant of right female breast ([place]) 12/12/2015  . Complication of anesthesia 10-06-15    slow to awaken after   . Dysrhythmia    A FIB - followed by Dr. Meda Coffee  . Eczema   . Family history of breast cancer   . Family history of prostate cancer   . GERD (gastroesophageal reflux disease)   . Hypercholesteremia   . Hypertension   . Kidney stones   . MRSA (methicillin resistant staph aureus) culture positive 2009    none since per patient  . Paroxysmal atrial fibrillation ([place]) 10/2011, 06/2015  . PONV (postoperative nausea and vomiting) 10-06-15   admitted back to hospital for dehydration  . Vitamin D deficiency     Patient Active Problem List   Diagnosis Date Noted  . Breast cancer metastasized to axillary lymph node ([place]) 05/13/2016  . Vaginal candidiasis 04/09/2016  . Lower abdominal pain 04/09/2016  . Infection of urinary tract 04/09/2016  . Skin ulceration ([place]) 04/09/2016  . Elevated troponin 04/09/2016  . Nausea and vomiting 04/08/2016  .  Physical deconditioning   . FTT (failure to thrive) in adult   . Dehydration 03/23/2016  . Protein-calorie malnutrition ([place]) 03/23/2016  . Postherpetic neuralgia 03/23/2016  . Genetic testing 03/18/2016  . Family history of breast cancer   . Family history of prostate cancer   . Hypokalemia 02/22/2016  . Hypomagnesemia 02/22/2016  . Sepsis ([place]) 02/21/2016  . Herpes zoster 02/21/2016  . Port catheter in place 02/10/2016  . Atrial fibrillation ([place]) 01/19/2016  . Enlarged RV (right ventricle) 12/26/2015  . DOE (dyspnea on exertion) 12/26/2015  . Breast cancer of upper-outer quadrant of right female breast ([place]) 12/12/2015  . Essential hypertension 10/24/2015  . Nausea after anesthesia 10/09/2015  . Atrial fibrillation with RVR ([place]) 07/08/2015  . Obesity 12/05/2014  . Prediabetes 11/13/2013  . Mixed hyperlipidemia   . Kidney stones   . Vitamin D deficiency     Past Surgical History:  Procedure Laterality Date  . BREAST SURGERY Left    biopsy-neg  . CYSTOSCOPY WITH HOLMIUM LASER LITHOTRIPSY Right 10/06/2015   Procedure: CYSTOSCOPY WITH HOLMIUM LASER LITHOTRIPSY;  Surgeon: Kathie Rhodes, MD;  Location: [facility];  Service: Urology;  Laterality: Right;  . KIDNEY STONE SURGERY  2013  . NEPHROLITHOTOMY Right 10/06/2015   Procedure: NEPHROLITHOTOMY PERCUTANEOUS;  Surgeon: Kathie Rhodes, MD;  Location: [facility];  Service: Urology;  Laterality: Right;  . PORTACATH PLACEMENT Left 12/23/2015   Procedure: INSERTION PORT-A-CATH WITH [place];  Surgeon:  Stark Klein, MD;  Location: [facility];  Service: General;  Laterality: Left;    OB History    No data available       Home Medications    Prior to Admission medications   Medication Sig Start Date End Date Taking? Authorizing Provider  acetaminophen (TYLENOL) 650 MG CR tablet Take 1,300 mg by mouth daily as needed for pain.    Yes Historical Provider, MD  atenolol (TENORMIN) 100 MG tablet Take 100 mg by mouth daily.   Yes Historical Provider, MD    atorvastatin (LIPITOR) 80 MG tablet take 1 tablet by mouth once daily 03/10/16  Yes Unk Pinto, MD  cholecalciferol (VITAMIN D) 1000 UNITS tablet Take 4,000 Units by mouth daily.   Yes Historical Provider, MD  clotrimazole-betamethasone (LOTRISONE) cream Apply 1 application topically 2 (two) times daily. Patient taking differently: Apply 1 application topically daily.  04/12/16  Yes Courtney Forcucci, PA-C  diltiazem (CARDIZEM CD) 120 MG 24 hr capsule Take 1 capsule (120 mg total) by mouth daily. 10/24/15  Yes Dorothy Spark, MD  ferrous sulfate 325 (65 FE) MG tablet Take 325 mg by mouth daily with breakfast.   Yes Historical Provider, MD  gabapentin (NEURONTIN) 100 MG capsule Take 3 capsules (300 mg total) by mouth 3 (three) times daily. 04/14/16  Yes Nicholas Lose, MD  montelukast (SINGULAIR) 10 MG tablet Take 1 tablet (10 mg total) by mouth daily. 12/26/15 12/25/16 Yes Courtney Forcucci, PA-C  nystatin (MYCOSTATIN/NYSTOP) powder Apply topically 2 (two) times daily. Patient taking differently: Apply 1 g topically 2 (two) times daily.  04/10/16  Yes Thurnell Lose, MD  ondansetron (ZOFRAN) 8 MG tablet Take 1 tablet (8 mg total) by mouth every 8 (eight) hours as needed for nausea or vomiting. Patient taking differently: Take 8 mg by mouth 2 (two) times daily.  04/14/16  Yes Nicholas Lose, MD  oxyCODONE-acetaminophen (PERCOCET/ROXICET) 5-325 MG tablet Take 1-2 tablets by mouth every 6 (six) hours as needed for severe pain. 05/13/16  Yes Stark Klein, MD  PAZEO 0.7 % SOLN Place 1 drop into both eyes daily as needed (allergies).  12/15/15  Yes Historical Provider, MD  potassium chloride SA (K-DUR,KLOR-CON) 20 MEQ tablet Take 1 tablet (20 mEq total) by mouth 2 (two) times daily. 03/29/16  Yes Dorothy Spark, MD  promethazine (PHENERGAN) 12.5 MG tablet take 1 tablet by mouth every 6 hours if needed for nausea and vomiting Patient taking differently: take 1 tablet by mouth twice daily for nausea and  vomiting 05/07/16  Yes Nicholas Lose, MD  rivaroxaban (XARELTO) 20 MG TABS tablet Take 1 tablet (20 mg total) by mouth daily with supper. 01/23/16  Yes Theodis Blaze, MD  triamterene-hydrochlorothiazide (MAXZIDE-25) 37.5-25 MG tablet Take 1 tablet by mouth daily. 02/28/16  Yes Historical Provider, MD  prochlorperazine (COMPAZINE) 10 MG tablet Take 1 tablet (10 mg total) by mouth every 6 (six) hours as needed (Nausea or vomiting). Patient not taking: Reported on 05/16/2016 02/11/16   Nicholas Lose, MD    Family History Family History  Problem Relation Age of Onset  . Hypertension Mother   . Hepatitis C Mother   . Heart disease Father   . Dementia Father   . Aneurysm Brother   . Breast cancer Paternal Aunt     dx in her 50s  . Prostate cancer Paternal Uncle     91s  . Prostate cancer Paternal Uncle     dx in his 108s  . Leukemia Paternal  Aunt   . Breast cancer Cousin     mother's paternal first cousin  . Thyroid cancer Cousin     mother's paternal first cousin    Social History Social History  Substance Use Topics  . Smoking status: Never Smoker  . Smokeless tobacco: Never Used  . Alcohol use No     Allergies   Penicillins   Review of Systems Review of Systems  Constitutional: Positive for fever. Negative for chills.  HENT: Negative for congestion, ear pain and sore throat.   Eyes: Negative for pain and visual disturbance.  Respiratory: Negative for cough and shortness of breath.   Cardiovascular: Positive for chest pain. Negative for leg swelling.  Gastrointestinal: Negative for abdominal pain, diarrhea and vomiting.  Genitourinary: Negative for dysuria and hematuria.  Musculoskeletal: Negative for arthralgias and back pain.  Skin: Positive for wound. Negative for color change and rash.  Neurological: Negative for seizures, syncope and headaches.  All other systems reviewed and are negative.    Physical Exam Updated Vital Signs BP 144/59 (BP Location: Right Arm)    Pulse 94   Temp 100 F (37.8 C) (Oral)   Resp 14   Ht 5\' 2"  (1.575 m)   Wt 118.8 kg   SpO2 100%   BMI 47.92 kg/m   Physical Exam  Constitutional: She appears well-developed and well-nourished. No distress.  HENT:  Head: Normocephalic and atraumatic.  Eyes: Conjunctivae are normal.  Neck: Neck supple.  Cardiovascular: Normal rate and regular rhythm.   No murmur heard. Pulmonary/Chest: Effort normal and breath sounds normal. No respiratory distress. She has no wheezes. She has no rales. She exhibits tenderness.  Abdominal: Soft. There is no tenderness.  Musculoskeletal: She exhibits no edema.  Neurological: She is alert.  Skin: Skin is warm and dry.  Healing wound to right breast, left port site.  Serous sanguinous drainage from JP drain on right side.  Psychiatric: She has a normal mood and affect.  Nursing note and vitals reviewed.    ED Treatments / Results  Labs (all labs ordered are listed, but only abnormal results are displayed) Labs Reviewed  COMPREHENSIVE METABOLIC PANEL - Abnormal; Notable for the following:       Result Value   Glucose, Bld 115 (*)    BUN <5 (*)    Total Protein 5.8 (*)    Albumin 3.4 (*)    All other components within normal limits  URINALYSIS, ROUTINE W REFLEX MICROSCOPIC (NOT AT [facility]) - Abnormal; Notable for the following:    Hgb urine dipstick SMALL (*)    Leukocytes, UA SMALL (*)    All other components within normal limits  URINE MICROSCOPIC-ADD ON - Abnormal; Notable for the following:    Squamous Epithelial / LPF 0-5 (*)    Bacteria, UA RARE (*)    All other components within normal limits  CULTURE, BLOOD (ROUTINE X 2)  CULTURE, BLOOD (ROUTINE X 2)  URINE CULTURE  I-STAT TROPOININ, ED  I-STAT TROPOININ, ED    EKG  EKG Interpretation  Date/Time:  Sunday May 16 2016 21:46:20 EDT Ventricular Rate:  93 PR Interval:    QRS Duration: 93 QT Interval:  375 QTC Calculation: 467 R Axis:   73 Text Interpretation:  Sinus rhythm  Borderline T wave abnormalities No significant change since last tracing Confirmed by FLOYD MD, DANIEL [phone]) on 05/16/2016 10:04:30 PM       Radiology Dg Chest 2 View  Result Date: 05/16/2016 CLINICAL DATA:  Fever.  Cancer. EXAM: CHEST  2 VIEW COMPARISON:  04/08/2016 FINDINGS: Limited lateral view anteriorly as the patient can not lift her right arm after recent surgery. Postoperative changes to the right chest including surgical drain. The left-sided porta catheter has been removed since previous. Normal heart size and mediastinal contours. No acute infiltrate or edema. No effusion or pneumothorax. No acute osseous findings. Subtle nodular density over the right mid chest may reflect a subpleural nodule seen on 01/30/2016 chest CT IMPRESSION: No evidence of pneumonia. Electronically Signed   By: Monte Fantasia M.D.   On: 05/16/2016 19:56  Ct Angio Chest Pe W And/or Wo Contrast  Result Date: 05/16/2016 CLINICAL DATA:  60 year old female with shortness of breath and chest pain. History of breast cancer. EXAM: CT ANGIOGRAPHY CHEST WITH CONTRAST TECHNIQUE: Multidetector CT imaging of the chest was performed using the standard protocol during bolus administration of intravenous contrast. Multiplanar CT image reconstructions and MIPs were obtained to evaluate the vascular anatomy. CONTRAST:  100 cc Isovue 370 COMPARISON:  Chest radiograph dated 05/16/2016 and CT dated 01/30/2016 FINDINGS: Left lung base linear atelectasis/ scarring noted. There are small areas of air trapping throughout the lungs which may be related to underlying small vessel versus small airway disease. There is no focal consolidation, pleural effusion, or pneumothorax. There is a 4 mm subpleural nodule in the right upper lobe (series 2 image 4) which is grossly similar or minimally increased in size compared to the prior study. This is not felt to represent metastatic disease. Continued follow-up recommended. The central airways are  patent. The thoracic aorta appears unremarkable the origins of the great vessels of the aortic arch appear patent. Evaluation of the pulmonary arteries is limited due to suboptimal opacification of the peripheral branches as well as due to respiratory motion artifact. No central pulmonary artery embolus identified. Top-normal cardiac size. No pericardial effusion. There is no hilar or mediastinal adenopathy. Esophagus is grossly unremarkable. No thyroid nodules identified. There is postsurgical changes of right axilla with removal of the previously seen right axillary mass. There is diffuse stranding of the soft tissues of the right lateral chest wall and right axilla with multiple surgical clips. A drainage catheter is seen extending from the right lateral chest wall to the soft tissues deep to the pectoralis minor. No drainable fluid collection or abscess identified. Multiple small pockets of gas noted in the soft tissues of the right anterior chest wall deep to the pectoralis minor. Small pocket of gas in the subcutaneous soft tissues of the left anterior chest wall likely related to recent surgery. A 2.5 x 2.6 cm nodular density with interspersed fat in the left breast similar to prior study may represent breast tissue. A mass is not excluded. Correlation with clinical exam and mammographic studies recommended. There is degenerative changes of the spine with osteophyte. No definite evidence of osseous metastasis. No acute fracture. A stable 1.3 cm hypodense lesion in the left lobe of the liver superiorly is not well characterized on the CT but may represent a cyst or hemangioma. The visualized upper abdomen appears unremarkable. Review of the MIP images confirms the above findings. IMPRESSION: No CT evidence of central pulmonary embolism. Mild interstitial lung disease. No definite CT evidence of pulmonary or osseous metastatic disease. A 4 mm right upper lobe subpleural nodule grossly similar or minimally  increased in size compared to the prior study. This is not felt to represent metastatic disease. Follow-up as clinically indicated. Interval incisional removal of the right axillary  lesion with postsurgical changes and drain placement. No fluid collection or abscess identified. Electronically Signed   By: Anner Crete M.D.   On: 05/16/2016 23:32   Procedures Procedures (including critical care time)  Medications Ordered in ED Medications  iopamidol (ISOVUE-370) 76 % injection (not administered)  acetaminophen (TYLENOL) tablet 1,000 mg (1,000 mg Oral Given 05/16/16 2130)     Initial Impression / Assessment and Plan / ED Course  I have reviewed the triage vital signs and the nursing notes.  Pertinent labs & imaging results that were available during my care of the patient were reviewed by me and considered in my medical decision making (see chart for details).  Clinical Course    Patient is a 60 year old female 4 days postop from right lumpectomy, left sided port removal who presents with her daughter for evaluation of fever, chills, diffuse chest pain. Patient denies any cough or drainage from her surgical sites.  Patient afebrile here. She is in no acute distress. She has some tenderness to palpation diffusely across her bilateral chest. No heart or lung abnormalities on exam. Sero-sanguinous drainage from JP drain on right side.  Differential diagnosis includes atelectasis versus infections vs. Pulmonary embolism. Patient without any new medications or urinary symptoms. Story not consistent with ACS however will obtain delta troponin.  EKG with no acute findings. Chest x-ray with no acute findings. UA negative for UTI. Troponin 2 negative. CMP unremarkable.  CT PE with no evidence of central pulmonary embolism, no fluid collection or abscess is noted.  Paged Dr. Barry Dienes due to recent postoperative patient. Reviewed patient's presentation, vital signs, imaging with Dr. Barry Dienes. Due  to no true fever here we decided to forego antibiotics. Patient will call Dr. Marlowe Aschoff office in the morning or subsequent they should she develop a fever.  Patient given Tylenol and immediate improvement in her symptoms. Patient with no true fever while in the emergency department.  Discussed with patient and her daughter who are in agreement with treatment plan and wished to be discharged.  Patient ambulatory in no acute distress at time of discharge.   Discussed with my attending, Dr. Tyrone Nine.   Final Clinical Impressions(s) / ED Diagnoses   Final diagnoses:  Chest pain, unspecified chest pain type    New Prescriptions New Prescriptions   No medications on file     Vira Blanco, MD 05/17/16 [place], DO 05/17/16 1501

## 2016-05-16 NOTE — ED Triage Notes (Signed)
Pt with stage 2 breast cancer. Pt had surgery on Thursday with Dr. Barry Dienes. Pt presenting with fever this am. Pt states fever = 103. Took tylenol this am. Temp 99.2 a triage. Pt states increasing bilateral pain since surgery.

## 2016-05-17 ENCOUNTER — Encounter (HOSPITAL_BASED_OUTPATIENT_CLINIC_OR_DEPARTMENT_OTHER): Payer: Self-pay | Admitting: General Surgery

## 2016-05-17 LAB — URINE CULTURE: Culture: 1000 — AB

## 2016-05-17 LAB — I-STAT TROPONIN, ED: TROPONIN I, POC: 0.03 ng/mL (ref 0.00–0.08)

## 2016-05-17 NOTE — Discharge Instructions (Signed)
Please call Dr. Marlowe Aschoff office should you develop fever greater than 10 39F.

## 2016-05-17 NOTE — Progress Notes (Signed)
Please let patient know that margins are negative and only 1 lymph node was positive.

## 2016-05-18 ENCOUNTER — Encounter: Payer: Self-pay | Admitting: Hematology and Oncology

## 2016-05-18 ENCOUNTER — Ambulatory Visit (INDEPENDENT_AMBULATORY_CARE_PROVIDER_SITE_OTHER): Payer: BC Managed Care – PPO | Admitting: Internal Medicine

## 2016-05-18 ENCOUNTER — Other Ambulatory Visit: Payer: Self-pay | Admitting: *Deleted

## 2016-05-18 VITALS — BP 106/62 | HR 80 | Temp 97.4°F | Resp 16 | Ht 62.0 in | Wt 162.8 lb

## 2016-05-18 DIAGNOSIS — B029 Zoster without complications: Secondary | ICD-10-CM | POA: Diagnosis not present

## 2016-05-18 DIAGNOSIS — L98499 Non-pressure chronic ulcer of skin of other sites with unspecified severity: Secondary | ICD-10-CM

## 2016-05-18 DIAGNOSIS — C50411 Malignant neoplasm of upper-outer quadrant of right female breast: Secondary | ICD-10-CM

## 2016-05-18 DIAGNOSIS — C773 Secondary and unspecified malignant neoplasm of axilla and upper limb lymph nodes: Secondary | ICD-10-CM

## 2016-05-18 DIAGNOSIS — C50911 Malignant neoplasm of unspecified site of right female breast: Secondary | ICD-10-CM

## 2016-05-18 MED ORDER — MONTELUKAST SODIUM 10 MG PO TABS: 10.0000 mg | ORAL_TABLET | Freq: Every day | ORAL | 2 refills | Status: DC

## 2016-05-18 MED ORDER — ATENOLOL 100 MG PO TABS: 100.0000 mg | ORAL_TABLET | Freq: Every day | ORAL | 2 refills | Status: DC

## 2016-05-18 MED ORDER — "TEGADERM CONTACT LAYER 8""X10"" PADS": 1.0000 "application " | MEDICATED_PAD | 3 refills | Status: AC | PRN

## 2016-05-18 NOTE — Progress Notes (Unsigned)
Received form from [employer] for patient to pick up. Will hold for patient appointment on 8/8.

## 2016-05-18 NOTE — Progress Notes (Signed)
Assessment and Plan: Hospital follow-up for recent fever and chest wall pain after a lumpectomy:   1. Shingles  - Gauze Pads & Dressings (TEGADERM CONTACT LAYER) [id number]" PADS; 1 application by Does not apply route every other day as needed.  Dispense: 1 each; Refill: 3  2. Breast cancer metastasized to axillary lymph node, right (HCC) -s/p lumpectomy -surgical incisions without infection -cont pain meds prn -follow-up with Dr. Lindi Adie and Dr. Barry Dienes -drain to be removed Aug 10th  3. Skin ulceration, with unspecified severity ([place]) -cont wound care with vaseline and tegaderm ordered  4. Breast cancer of upper-outer quadrant of right female breast ([place]) -follow with Dr. Lindi Adie and Dr. Barry Dienes  Over 30 minutes of exam, counseling, chart review, and complex, moderate level critical decision making was performed this visit.   HPI 60 y.o.female presents for follow up from the hospital. Admit date to the hospital was 05/16/16, patient was discharged from the hospital on 05/17/16 and our office contacted the office the day after discharge to set up a follow up appointment, patient was admitted for: Chest wall pain and fever after have a right breast lumpectomy and have a port a cath removed from the left chest wall.  Patient was evaluated for a fever, but was never found to be febrile in the ED.  She had a CT angio of her chest which was negative and CXR was negative for PNA.  JP drains had serosanguinous drainage with no purulence.  Patient did follow-up with Dr. Barry Dienes.  She is due to go back to her on August the 10th.  She reports that she is not having any more fevers.  She reports that she is very anxious.  She is crying a lot.  A lot of the stress is coming from her family.    Images while in the hospital: Dg Chest 2 View  Result Date: 05/16/2016 CLINICAL DATA:  Fever.  Cancer. EXAM: CHEST  2 VIEW COMPARISON:  04/08/2016 FINDINGS: Limited lateral view anteriorly as the patient can not lift her  right arm after recent surgery. Postoperative changes to the right chest including surgical drain. The left-sided porta catheter has been removed since previous. Normal heart size and mediastinal contours. No acute infiltrate or edema. No effusion or pneumothorax. No acute osseous findings. Subtle nodular density over the right mid chest may reflect a subpleural nodule seen on 01/30/2016 chest CT IMPRESSION: No evidence of pneumonia. Electronically Signed   By: Monte Fantasia M.D.   On: 05/16/2016 19:56  Ct Angio Chest Pe W And/or Wo Contrast  Result Date: 05/16/2016 CLINICAL DATA:  60 year old female with shortness of breath and chest pain. History of breast cancer. EXAM: CT ANGIOGRAPHY CHEST WITH CONTRAST TECHNIQUE: Multidetector CT imaging of the chest was performed using the standard protocol during bolus administration of intravenous contrast. Multiplanar CT image reconstructions and MIPs were obtained to evaluate the vascular anatomy. CONTRAST:  100 cc Isovue 370 COMPARISON:  Chest radiograph dated 05/16/2016 and CT dated 01/30/2016 FINDINGS: Left lung base linear atelectasis/ scarring noted. There are small areas of air trapping throughout the lungs which may be related to underlying small vessel versus small airway disease. There is no focal consolidation, pleural effusion, or pneumothorax. There is a 4 mm subpleural nodule in the right upper lobe (series 2 image 4) which is grossly similar or minimally increased in size compared to the prior study. This is not felt to represent metastatic disease. Continued follow-up recommended. The central airways are patent. The thoracic  aorta appears unremarkable the origins of the great vessels of the aortic arch appear patent. Evaluation of the pulmonary arteries is limited due to suboptimal opacification of the peripheral branches as well as due to respiratory motion artifact. No central pulmonary artery embolus identified. Top-normal cardiac size. No  pericardial effusion. There is no hilar or mediastinal adenopathy. Esophagus is grossly unremarkable. No thyroid nodules identified. There is postsurgical changes of right axilla with removal of the previously seen right axillary mass. There is diffuse stranding of the soft tissues of the right lateral chest wall and right axilla with multiple surgical clips. A drainage catheter is seen extending from the right lateral chest wall to the soft tissues deep to the pectoralis minor. No drainable fluid collection or abscess identified. Multiple small pockets of gas noted in the soft tissues of the right anterior chest wall deep to the pectoralis minor. Small pocket of gas in the subcutaneous soft tissues of the left anterior chest wall likely related to recent surgery. A 2.5 x 2.6 cm nodular density with interspersed fat in the left breast similar to prior study may represent breast tissue. A mass is not excluded. Correlation with clinical exam and mammographic studies recommended. There is degenerative changes of the spine with osteophyte. No definite evidence of osseous metastasis. No acute fracture. A stable 1.3 cm hypodense lesion in the left lobe of the liver superiorly is not well characterized on the CT but may represent a cyst or hemangioma. The visualized upper abdomen appears unremarkable. Review of the MIP images confirms the above findings. IMPRESSION: No CT evidence of central pulmonary embolism. Mild interstitial lung disease. No definite CT evidence of pulmonary or osseous metastatic disease. A 4 mm right upper lobe subpleural nodule grossly similar or minimally increased in size compared to the prior study. This is not felt to represent metastatic disease. Follow-up as clinically indicated. Interval incisional removal of the right axillary lesion with postsurgical changes and drain placement. No fluid collection or abscess identified. Electronically Signed   By: Anner Crete M.D.   On: 05/16/2016  23:32   Past Medical History:  Diagnosis Date  . Anemia   . Atrial fibrillation ([place]) 2016  . Breast cancer ([place])   . Breast cancer of upper-outer quadrant of right female breast ([place]) 12/12/2015  . Complication of anesthesia 10-06-15    slow to awaken after   . Dysrhythmia    A FIB - followed by Dr. Meda Coffee  . Eczema   . Family history of breast cancer   . Family history of prostate cancer   . GERD (gastroesophageal reflux disease)   . Hypercholesteremia   . Hypertension   . Kidney stones   . MRSA (methicillin resistant staph aureus) culture positive 2009    none since per patient  . Paroxysmal atrial fibrillation ([place]) 10/2011, 06/2015  . PONV (postoperative nausea and vomiting) 10-06-15   admitted back to hospital for dehydration  . Vitamin D deficiency      Allergies  Allergen Reactions  . Penicillins Other (See Comments)    Has patient had a PCN reaction causing immediate rash, facial/tongue/throat swelling, SOB or lightheadedness with hypotension: NO Has patient had a PCN reaction causing severe rash involving mucus membranes or skin necrosis: NO Has patient had a PCN reaction that required hospitalization NO Has patient had a PCN reaction occurring within the last 10 years: NO If all of the above answers are "NO", then may proceed with Cephalosporin use.  Current Outpatient Prescriptions on File Prior to Visit  Medication Sig Dispense Refill  . acetaminophen (TYLENOL) 650 MG CR tablet Take 1,300 mg by mouth daily as needed for pain.     Marland Kitchen atorvastatin (LIPITOR) 80 MG tablet take 1 tablet by mouth once daily 30 tablet 3  . cholecalciferol (VITAMIN D) 1000 UNITS tablet Take 4,000 Units by mouth daily.    . clotrimazole-betamethasone (LOTRISONE) cream Apply 1 application topically 2 (two) times daily. (Patient taking differently: Apply 1 application topically daily. ) 30 g 0  . diltiazem (CARDIZEM CD) 120 MG 24 hr capsule Take 1 capsule (120 mg total) by mouth  daily. 90 capsule 3  . ferrous sulfate 325 (65 FE) MG tablet Take 325 mg by mouth daily with breakfast.    . gabapentin (NEURONTIN) 100 MG capsule Take 3 capsules (300 mg total) by mouth 3 (three) times daily. 180 capsule 0  . nystatin (MYCOSTATIN/NYSTOP) powder Apply topically 2 (two) times daily. (Patient taking differently: Apply 1 g topically 2 (two) times daily. ) 15 g 0  . ondansetron (ZOFRAN) 8 MG tablet Take 1 tablet (8 mg total) by mouth every 8 (eight) hours as needed for nausea or vomiting. (Patient taking differently: Take 8 mg by mouth 2 (two) times daily. ) 60 tablet 0  . oxyCODONE-acetaminophen (PERCOCET/ROXICET) 5-325 MG tablet Take 1-2 tablets by mouth every 6 (six) hours as needed for severe pain. 30 tablet 0  . PAZEO 0.7 % SOLN Place 1 drop into both eyes daily as needed (allergies).     . potassium chloride SA (K-DUR,KLOR-CON) 20 MEQ tablet Take 1 tablet (20 mEq total) by mouth 2 (two) times daily. 60 tablet 6  . prochlorperazine (COMPAZINE) 10 MG tablet Take 1 tablet (10 mg total) by mouth every 6 (six) hours as needed (Nausea or vomiting). 30 tablet 1  . promethazine (PHENERGAN) 12.5 MG tablet take 1 tablet by mouth every 6 hours if needed for nausea and vomiting (Patient taking differently: take 1 tablet by mouth twice daily for nausea and vomiting) 60 tablet 0  . rivaroxaban (XARELTO) 20 MG TABS tablet Take 1 tablet (20 mg total) by mouth daily with supper. 90 tablet 3  . triamterene-hydrochlorothiazide (MAXZIDE-25) 37.5-25 MG tablet Take 1 tablet by mouth daily.     No current facility-administered medications on file prior to visit.     Review of Systems  Constitutional: Positive for malaise/fatigue. Negative for chills and fever.  Respiratory: Negative for cough, shortness of breath and wheezing.   Cardiovascular: Positive for chest pain. Negative for palpitations and leg swelling.  Psychiatric/Behavioral: Positive for depression. The patient is nervous/anxious. The  patient does not have insomnia.      Physical Exam: Filed Weights   05/18/16 1128  Weight: 162 lb 12.8 oz (73.8 kg)   BP 106/62   Pulse 80   Temp 97.4 F (36.3 C)   Resp 16   Ht 5\' 2"  (1.575 m)   Wt 162 lb 12.8 oz (73.8 kg)   BMI 29.78 kg/m  General Appearance: Well nourished, in no apparent distress. Eyes: PERRLA, EOMs, conjunctiva no swelling or erythema Sinuses: No Frontal/maxillary tenderness ENT/Mouth: Ext aud canals clear, TMs without erythema, bulging. No erythema, swelling, or exudate on post pharynx.  Tonsils not swollen or erythematous. Hearing normal.  Neck: Supple, thyroid normal.  Respiratory: Respiratory effort normal, BS equal bilaterally without rales, rhonchi, wheezing or stridor.  Cardio: RRR with no MRGs. Brisk peripheral pulses without edema. Well healing incision to  the left anterior chest.  No redness warmth or tenderness.  There is well healing surgical incision to the right axilla, and right breast with steri strips in place.  Well healing.  JP drain in place with serosanguinous fluid in drain.  Dressings clean and dry.   Abdomen: Soft, + BS.  Non tender, no guarding, rebound, hernias, masses. Lymphatics: Non tender without lymphadenopathy.  Musculoskeletal: Full ROM, 5/5 strength, normal gait.  Skin: Warm, dry with eczema present throughout, lesions, ecchymosis. There is ulcerated wound to the left breast and left posterior back.   Neuro: Cranial nerves intact. Normal muscle tone, no cerebellar symptoms. Sensation intact.  Psych: Awake and oriented X 3, normal affect, Insight and Judgment appropriate.     Starlyn Skeans, PA-C 11:44 AM [facility]

## 2016-05-19 ENCOUNTER — Encounter: Payer: Self-pay | Admitting: Hematology and Oncology

## 2016-05-19 ENCOUNTER — Encounter: Payer: Self-pay | Admitting: Internal Medicine

## 2016-05-19 NOTE — Progress Notes (Signed)
recd in pod. left for dr. Lindi Adie to sign

## 2016-05-20 ENCOUNTER — Encounter: Payer: Self-pay | Admitting: Hematology and Oncology

## 2016-05-20 ENCOUNTER — Encounter: Payer: Self-pay | Admitting: Internal Medicine

## 2016-05-20 ENCOUNTER — Other Ambulatory Visit: Payer: Self-pay | Admitting: Internal Medicine

## 2016-05-20 MED ORDER — DULOXETINE HCL 30 MG PO CPEP: 30.0000 mg | ORAL_CAPSULE | Freq: Every day | ORAL | 2 refills | Status: DC

## 2016-05-20 NOTE — Progress Notes (Unsigned)
Patient form that was given to me by daughter is in a white envelope in Miss [name]'s drawer with her name on it for her to pick up on 05/21/16. Her daughter may pick up for her.

## 2016-05-21 ENCOUNTER — Ambulatory Visit (HOSPITAL_BASED_OUTPATIENT_CLINIC_OR_DEPARTMENT_OTHER): Payer: BC Managed Care – PPO | Admitting: Hematology and Oncology

## 2016-05-21 ENCOUNTER — Telehealth: Payer: Self-pay | Admitting: Hematology and Oncology

## 2016-05-21 ENCOUNTER — Encounter: Payer: Self-pay | Admitting: Hematology and Oncology

## 2016-05-21 ENCOUNTER — Encounter: Payer: Self-pay | Admitting: General Practice

## 2016-05-21 DIAGNOSIS — C50411 Malignant neoplasm of upper-outer quadrant of right female breast: Secondary | ICD-10-CM | POA: Diagnosis not present

## 2016-05-21 DIAGNOSIS — C773 Secondary and unspecified malignant neoplasm of axilla and upper limb lymph nodes: Secondary | ICD-10-CM

## 2016-05-21 LAB — CULTURE, BLOOD (ROUTINE X 2): CULTURE: NO GROWTH

## 2016-05-21 MED ORDER — ONDANSETRON HCL 8 MG PO TABS: 8.0000 mg | ORAL_TABLET | Freq: Three times a day (TID) | ORAL | 0 refills | Status: AC | PRN

## 2016-05-21 NOTE — Progress Notes (Signed)
Patient Care Team: Unk Pinto, MD as PCP - General (Internal Medicine) Warden Fillers, MD as Consulting Physician (Ophthalmology) Kathie Rhodes, MD as Consulting Physician (Urology) Danella Sensing, MD as Consulting Physician (Dermatology) Alfonso Patten, RN (Inactive) as Registered Nurse Dorothy Spark, MD as Consulting Physician (Cardiology) Sylvan Cheese, NP as Nurse Practitioner (Hematology and Oncology)  DIAGNOSIS: Breast cancer of upper-outer quadrant of right female breast [facility])   Staging form: Breast, AJCC 7th Edition   - Clinical stage from 12/17/2015: Stage IIA (T2, N0, M0) - Unsigned         Staging comments: Staged at breast conference on 3.1.17  SUMMARY OF ONCOLOGIC HISTORY:   Breast cancer of upper-outer quadrant of right female breast ([place])   12/05/2015 Mammogram    Right breast mammogram and ultrasound revealed 2.8 x 2.7 x 2.1 irregular hypoechoic mass at 10:00 position, few mildly prominent right extrarenal lymph nodes are identified but unchanged since 2008     12/10/2015 Initial Diagnosis    Right breast biopsy 10:00 position: 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 Differential diagnosis complete replacement of lymph node versus direct extension     01/19/2016 - 01/22/2016 Hospital Admission    Hospitalization for abdominal pain, diarrhea, nausea and vomiting     02/21/2016 - 02/24/2016 Hospital Admission    Hospitalization for herpes zoster and sepsis due to cellulitis     03/05/2016 Breast MRI    Decrease in the right breast malignancy from 3.2 cm to 2.9 cm, decreasing the lymph node from 2.1 cm to 0.9 cm     03/22/2016 - 03/26/2016 Hospital Admission    Hospitalization for suspicion for wound infection from the shingles     05/13/2016 Surgery    Right lumpectomy: IDC, 3 cm, margins negative, 1/23 lymph nodes with extracapsular extension, lymphovascular invasion present, ER 0%, PR 0%, HER-2/neu negative ratio 1.74, Ki-67 60%,ypT2ypN1 (stage IIB)     CHIEF COMPLIANT: Follow-up after recent right lumpectomy  INTERVAL HISTORY: [name] is a 60 year old with above-mentioned history of right breast cancer treated with neoadjuvant chemotherapy followed by recent right lumpectomy. She had extraordinary amount of difficulty getting through chemotherapy which included episode of profound shingles as well as sepsis and pneumonia. All of which finally cleared up so that she could undergo surgery. 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 She currently has a right sided drain and is quite uncomfortable related to that. She has noticed some yellowish discharge around the drain and she wanted to make sure that there is no infection.  REVIEW OF SYSTEMS:   Constitutional: Denies fevers, chills or abnormal weight loss Eyes: Denies blurriness of vision Ears, nose, mouth, throat, and face: Denies mucositis or sore throat Respiratory: Denies cough, dyspnea or wheezes Cardiovascular: Denies palpitation, chest discomfort Gastrointestinal:  Denies nausea, heartburn or change in bowel habits Skin: Denies abnormal skin  rashes Lymphatics: Denies new lymphadenopathy or easy bruising Neurological:Denies numbness, tingling or new weaknesses Behavioral/Psych: Tearfulness and problems with depression. Recently started on Cymbalta.  Extremities: No lower extremity  edema Breast: Recent right lumpectomy now has an axillary drain due to x-ray lymph node dissection All other systems were reviewed with the patient and are negative.  I have reviewed the past medical history, past surgical history, social history and family history with the patient and they are unchanged from previous note.  ALLERGIES:  is allergic to penicillins.  MEDICATIONS:  Current Outpatient Prescriptions  Medication Sig Dispense Refill  . acetaminophen (TYLENOL) 650 MG CR tablet Take 1,300 mg by mouth daily as needed for pain.     Marland Kitchen atenolol (TENORMIN) 100 MG tablet Take 1 tablet (100 mg total) by mouth daily. 30 tablet 2  . atorvastatin (LIPITOR) 80 MG tablet take 1 tablet by mouth once daily 30 tablet 3  . cholecalciferol (VITAMIN D) 1000 UNITS tablet Take 4,000 Units by mouth daily.    . clotrimazole-betamethasone (LOTRISONE) cream Apply 1 application topically 2 (two) times daily. (Patient taking differently: Apply 1 application topically daily. ) 30 g 0  . diltiazem (CARDIZEM CD) 120 MG 24 hr capsule Take 1 capsule (120 mg total) by mouth daily. 90 capsule 3  . DULoxetine (CYMBALTA) 30 MG capsule Take 1 capsule (30 mg total) by mouth daily. 30 capsule 2  . ferrous sulfate 325 (65 FE) MG tablet Take 325 mg by mouth daily with breakfast.    . gabapentin (NEURONTIN) 100 MG capsule Take 3 capsules (300 mg total) by mouth 3 (three) times daily. 180 capsule 0  . Gauze Pads & Dressings (TEGADERM CONTACT LAYER) 5"I77" PADS 1 application by Does not apply route every other day as needed. 1 each 3  . montelukast (SINGULAIR) 10 MG tablet Take 1 tablet (10 mg total) by mouth daily. 30 tablet 2  . nystatin (MYCOSTATIN/NYSTOP) powder Apply topically 2 (two) times daily. (Patient taking differently: Apply 1 g topically 2 (two) times daily. ) 15 g 0  . ondansetron (ZOFRAN) 8 MG tablet Take 1 tablet (8 mg total) by mouth every 8 (eight) hours as needed for nausea or vomiting. 20 tablet 0  .  oxyCODONE-acetaminophen (PERCOCET/ROXICET) 5-325 MG tablet Take 1-2 tablets by mouth every 6 (six) hours as needed for severe pain. 30 tablet 0  . PAZEO 0.7 % SOLN Place 1 drop into both eyes daily as needed (allergies).     . potassium chloride SA (K-DUR,KLOR-CON) 20 MEQ tablet Take 1 tablet (20 mEq total) by mouth 2 (two) times daily. 60 tablet 6  . prochlorperazine (COMPAZINE) 10 MG tablet Take 1 tablet (10 mg total) by mouth every 6 (six) hours as needed (Nausea or vomiting). 30 tablet 1  . rivaroxaban (XARELTO) 20 MG TABS tablet Take 1 tablet (20 mg total) by mouth daily with supper. 90 tablet 3  . triamterene-hydrochlorothiazide (MAXZIDE-25) 37.5-25 MG tablet Take 1 tablet by mouth daily.     No current facility-administered medications for this visit.     PHYSICAL EXAMINATION: ECOG PERFORMANCE STATUS: 1 - Symptomatic but completely ambulatory  Vitals:   05/21/16 0946  BP: (!) 149/87  Pulse: 80  Resp: 18  Temp: 98.5 F (36.9 C)   Filed Weights   05/21/16 0946  Weight: 161 lb 6.4 oz (73.2 kg)    GENERAL:alert, no distress and comfortable SKIN: skin color, texture, turgor are normal, no rashes or significant lesions EYES: normal, Conjunctiva are pink and  non-injected, sclera clear OROPHARYNX:no exudate, no erythema and lips, buccal mucosa, and tongue normal  NECK: supple, thyroid normal size, non-tender, without nodularity LYMPH:  no palpable lymphadenopathy in the cervical, axillary or inguinal LUNGS: clear to auscultation and percussion with normal breathing effort HEART: regular rate & rhythm and no murmurs and no lower extremity edema ABDOMEN:abdomen soft, non-tender and normal bowel sounds MUSCULOSKELETAL:no cyanosis of digits and no clubbing  NEURO: alert & oriented x 3 with fluent speech, no focal motor/sensory deficits EXTREMITIES: No lower extremity edema BREAST: Yellowish discharge around the gauze that was applied around the surgical site (exam performed in the  presence of a chaperone)  LABORATORY DATA:  I have reviewed the data as listed   Chemistry      Component Value Date/Time   NA 139 05/16/2016 1740   NA 137 02/26/2016 1232   K 4.0 05/16/2016 1740   K 3.6 02/26/2016 1232   CL 104 05/16/2016 1740   CO2 26 05/16/2016 1740   CO2 26 02/26/2016 1232   BUN <5 (L) 05/16/2016 1740   BUN 7.7 02/26/2016 1232   CREATININE 0.90 05/16/2016 1740   CREATININE 0.59 04/12/2016 1558   CREATININE 0.7 02/26/2016 1232      Component Value Date/Time   CALCIUM 9.8 05/16/2016 1740   CALCIUM 8.7 02/26/2016 1232   ALKPHOS 72 05/16/2016 1740   ALKPHOS 77 02/26/2016 1232   AST 24 05/16/2016 1740   AST 24 02/26/2016 1232   ALT 16 05/16/2016 1740   ALT 18 02/26/2016 1232   BILITOT 0.3 05/16/2016 1740   BILITOT <0.30 02/26/2016 1232       Lab Results  Component Value Date   WBC 5.4 04/12/2016   HGB 10.6 (L) 04/12/2016   HCT 34.0 (L) 04/12/2016   MCV 89.2 04/12/2016   PLT 285 04/12/2016   NEUTROABS 2,646 04/12/2016     ASSESSMENT & PLAN:  Breast cancer of upper-outer quadrant of right female breast ([place]) Right breast biopsy 12/10/2015: 10:00 position: 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 I discussed the margins as well as lymph node surgeries. We also discussed the final staging along with previously performed ER/PR and HER-2/neu testing.  Depression: She was started on Cymbalta yesterday. I Luan Pulling do sitdown talk to her.  Recommendation: Adjuvant radiation therapy followed with surveillance  Return to clinic in 6 months for follow-up    Orders Placed  This Encounter  Procedures  . CBC with Differential    Standing Status:   Future    Standing Expiration Date:   05/21/2017  . Comprehensive metabolic panel    Standing Status:   Future    Standing Expiration Date:   05/21/2017   The patient has a good understanding of the overall plan. she agrees with it. she will call with any problems that may develop before the next visit here.   Rulon Eisenmenger, MD 05/21/16

## 2016-05-21 NOTE — Progress Notes (Signed)
Spiritual Care Note  Referred by breast navigator Dawn Stuart/RN for emotional support, as pt was tearful at appt with Dr Lindi Adie.   [name] shared readily about multiple stressors, some current (healing from surgery, approaching radiation, hx eczema and some hostile/fearful reactions to her appearance, recent hx several health struggles, missing meaning-making/contribution of work) and some cumulative from more distant past (husband died 78y ago; pt raised 2 dtrs, 3 grands on her own).  Per pt, she has been the rock for her family, and now she has many needs to receive support or at least to focus on her own self-care and healing.  We plan to f/u by appt Monday for further emotional support, with future appts to piggyback on rad onc visits.  Attempted to reach pt by phone to offer meeting times per our plan, but no answer.  Will continue attempts.  [place], [place], [facility] Pager [phone] Voicemail [phone]

## 2016-05-21 NOTE — Progress Notes (Signed)
Spiritual Care addendum  Spoke with [name] by phone, scheduling f/u emotional support appt at 9:30 am on Monday 8/7.  [place], [place], [facility] Pager [phone] Voicemail [phone]

## 2016-05-21 NOTE — Progress Notes (Signed)
recd in pod. left for dr. Lindi Adie to sign- gave form to patient and sent to medical recrds

## 2016-05-21 NOTE — Telephone Encounter (Signed)
per pof to sch pt appt-cld pt and adv of appt time & date for 11/26/16@10 

## 2016-05-21 NOTE — Assessment & Plan Note (Signed)
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 I discussed the margins as well as lymph node surgeries. We also discussed the final staging along with previously performed ER/PR and HER-2/neu testing.  Recommendation: Adjuvant radiation therapy followed with surveillance Return to clinic in 6 months for follow-up

## 2016-05-25 ENCOUNTER — Encounter: Payer: Self-pay | Admitting: Internal Medicine

## 2016-05-25 ENCOUNTER — Other Ambulatory Visit: Payer: Self-pay | Admitting: Internal Medicine

## 2016-05-25 ENCOUNTER — Ambulatory Visit: Payer: BC Managed Care – PPO

## 2016-05-25 ENCOUNTER — Encounter: Payer: Self-pay | Admitting: General Practice

## 2016-05-25 MED ORDER — DIAZEPAM 2 MG PO TABS: 2.0000 mg | ORAL_TABLET | Freq: Two times a day (BID) | ORAL | 0 refills | Status: DC | PRN

## 2016-05-25 NOTE — Progress Notes (Signed)
Spiritual Care Note  Received call from Renae, who was feeling exhausted due to lack of sleep and additionally distressed by blood in urine (per pt, had just had xray to investigate possible passing of kidney stone fragments).  She was tearful and sounded very down and overwhelmed.  Provided empathic listening, pastoral reflection, strengths-based affirmation, and prayer as encouragement and care.  Per pt permission, making referral for Alight Guide, as pt indicated that facing illness is a big cause of distress.  Trying to build in as many layers of support as possible.  Plan to refer to incoming counseling intern later this month as well.  F/u spiritual care appt scheduled for Monday at 9:30.  [place], [place], [facility] Pager [phone] Voicemail [phone]

## 2016-05-26 ENCOUNTER — Encounter: Payer: Self-pay | Admitting: General Practice

## 2016-05-26 NOTE — Progress Notes (Signed)
Spiritual Care Note  Received call from Adelinn, who initially reported a better day today, but returned to a down and discouraged place.  She became tearful when talking about her concern about her physical health and her daughters' lack of deep concern (that is, of registering how much she emotional and logistical support she really needs).  Talked her through a self-care plan to help her refocus this evening.  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  She looks forward to Spiritual Care and Financial Advocate appts on Monday.  [place], [place], [facility] Pager [phone] Voicemail [phone]

## 2016-05-27 ENCOUNTER — Encounter: Payer: Self-pay | Admitting: Internal Medicine

## 2016-05-31 ENCOUNTER — Other Ambulatory Visit: Payer: Self-pay

## 2016-05-31 ENCOUNTER — Ambulatory Visit: Payer: BC Managed Care – PPO

## 2016-05-31 ENCOUNTER — Encounter: Payer: Self-pay | Admitting: General Practice

## 2016-05-31 ENCOUNTER — Encounter: Payer: Self-pay | Admitting: Hematology and Oncology

## 2016-05-31 DIAGNOSIS — F329 Major depressive disorder, single episode, unspecified: Secondary | ICD-10-CM

## 2016-05-31 DIAGNOSIS — C50411 Malignant neoplasm of upper-outer quadrant of right female breast: Secondary | ICD-10-CM

## 2016-05-31 DIAGNOSIS — F32A Depression, unspecified: Secondary | ICD-10-CM

## 2016-05-31 NOTE — Progress Notes (Signed)
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 Stark Klein, MD  1. Breast, lumpectomy, Right - INVASIVE DUCTAL CARCINOMA, 3 CM ASSOCIATED WITH EXTENSIVE FIBROSIS. - MARGINS NOT INVOLVED. - CLOSEST MARGIN SUPERIOR AT 0.1 CM. 2. Lymph nodes, regional resection, Right Axillary - METASTATIC CARCINOMA IN ONE OF TWENTY-THREE LYMPH NODES WITH ASSOCIATED EXTRACAPSULAR Diagnosis 01/06/2016: Lymph node, needle/core biopsy, right axilla level 1 node - POSITIVE FOR DUCTAL CARCINOMA Past/Anticipated interventions by medical oncology, if any: Chemotherapy   Lymphedema issues, if any:  no  Pain issues, if any: pain on back and breasts hurt, has clearing up of shingles on back and left breast has had for 3 months stated, right breast dry     SAFETY ISSUES:  Prior radiation? No  Pacemaker/ICD?  No  Possible current pregnancy? NO  Is the patient on methotrexate?  No  Current Complaints / other details:   Widowed, 2 children menses age 55,  Age 1st live birth 33 or 72,    Seen 12/19/15 multidisciplinary  Breast clinic  Allergies:PCNS , hormone pill made face swell not sure of name in her 40's  BP 109/73 (BP Location: Left Arm, Patient Position: Standing, Cuff Size: Normal)   Pulse 70   Temp 98.3 F (36.8 C) (Oral)   Resp 16   Ht '5\' 2"'$  (1.575 m)   Wt 160 lb 4.8 oz (72.7 kg)   BMI 29.32 kg/m   Wt Readings from Last 3 Encounters:  06/02/16 160 lb 4.8 oz (72.7 kg)  05/21/16 161 lb 6.4 oz (73.2 kg)  05/18/16 162 lb 12.8 oz (73.8 kg)       Rebecca Eaton, RN 05/31/2016,3:51 PM

## 2016-05-31 NOTE — Progress Notes (Signed)
Met with patient and her daughter Janace Hoard. Patient brought proof of income for [employer]. Patient currently receiving disability through her employer. Patient approved for one-time Alight grant of $1000. Patient has a copy of the award as well as expense sheet and the outpatient pharmacy information Also gave patient application for [employer] and [employer] application due to concerns with medical bills. Gave patient name and number to Meredith(FA for Radiation) to ask about radiation transportation grant and for other financial concerns while in Radiation. Patient has my card for any additional financial questions or concerns.

## 2016-05-31 NOTE — Progress Notes (Signed)
05/31/2016 Received call from dtr, Vira Agar. States they wanted DME agency to pick up hospital bed. Provided her with contact number for [employer]. They delivered bed. Jonnie Finner RN CCM Case Mgmt phone [phone]

## 2016-06-01 ENCOUNTER — Other Ambulatory Visit: Payer: Self-pay | Admitting: Internal Medicine

## 2016-06-01 ENCOUNTER — Encounter: Payer: Self-pay | Admitting: Internal Medicine

## 2016-06-01 ENCOUNTER — Encounter: Payer: Self-pay | Admitting: Radiation Oncology

## 2016-06-01 NOTE — Progress Notes (Signed)
Spiritual Care Note  [name] was emotionally down and physically low-energy (sluggish gait, etc) at this f/u appt.  Provided emotional and logistical support, consulting with Abigail Elmore/LCSW re pt's questions about financial resources.  Encouraged her to schedule appt with Abigail following appt with Shauna Hodges/Financial Advocate.  Also wrote down pt's questions for appt with Stefanie Libel to assist Karysa with verbalizing her needs and organizing her thoughts in advance, because she identifies that speaking up for herself in phone calls and appts is hard for her--but she does not wish to defer to daughters for this support.  We plan to f/u by phone on Thursday 8/17 to give pt opportunity to share and process updates.  She verbalized interest in psychiatric support to combine counseling with med management (hoping for rx that may help lighten her mood in order to make everyday life more bearable).  Consulting with Dr Lindi Adie and LCSW about possible referrals.  [place], [place], [facility] Pager [phone] Voicemail [phone]

## 2016-06-02 ENCOUNTER — Ambulatory Visit
Admission: RE | Admit: 2016-06-02 | Discharge: 2016-06-02 | Disposition: A | Discharge status: Home / Self Care | Payer: BC Managed Care – PPO | Source: Ambulatory Visit | Attending: Radiation Oncology | Admitting: Radiation Oncology

## 2016-06-02 ENCOUNTER — Other Ambulatory Visit: Payer: Self-pay | Admitting: Internal Medicine

## 2016-06-02 ENCOUNTER — Encounter: Payer: Self-pay | Admitting: Radiation Oncology

## 2016-06-02 VITALS — BP 109/73 | HR 70 | Temp 98.3°F | Resp 16 | Ht 62.0 in | Wt 160.3 lb

## 2016-06-02 DIAGNOSIS — C50411 Malignant neoplasm of upper-outer quadrant of right female breast: Secondary | ICD-10-CM | POA: Diagnosis not present

## 2016-06-02 DIAGNOSIS — Z880 Allergy status to penicillin: Secondary | ICD-10-CM | POA: Insufficient documentation

## 2016-06-02 DIAGNOSIS — Z807 Family history of other malignant neoplasms of lymphoid, hematopoietic and related tissues: Secondary | ICD-10-CM | POA: Diagnosis not present

## 2016-06-02 DIAGNOSIS — Z8249 Family history of ischemic heart disease and other diseases of the circulatory system: Secondary | ICD-10-CM | POA: Insufficient documentation

## 2016-06-02 DIAGNOSIS — Z7901 Long term (current) use of anticoagulants: Secondary | ICD-10-CM | POA: Diagnosis not present

## 2016-06-02 DIAGNOSIS — L98491 Non-pressure chronic ulcer of skin of other sites limited to breakdown of skin: Secondary | ICD-10-CM

## 2016-06-02 DIAGNOSIS — K219 Gastro-esophageal reflux disease without esophagitis: Secondary | ICD-10-CM | POA: Diagnosis not present

## 2016-06-02 DIAGNOSIS — Z803 Family history of malignant neoplasm of breast: Secondary | ICD-10-CM | POA: Insufficient documentation

## 2016-06-02 DIAGNOSIS — E7800 Pure hypercholesterolemia, unspecified: Secondary | ICD-10-CM | POA: Diagnosis not present

## 2016-06-02 DIAGNOSIS — Z806 Family history of leukemia: Secondary | ICD-10-CM | POA: Insufficient documentation

## 2016-06-02 DIAGNOSIS — Z9889 Other specified postprocedural states: Secondary | ICD-10-CM | POA: Diagnosis not present

## 2016-06-02 DIAGNOSIS — Z8042 Family history of malignant neoplasm of prostate: Secondary | ICD-10-CM | POA: Diagnosis not present

## 2016-06-02 DIAGNOSIS — I10 Essential (primary) hypertension: Secondary | ICD-10-CM | POA: Insufficient documentation

## 2016-06-02 DIAGNOSIS — I4891 Unspecified atrial fibrillation: Secondary | ICD-10-CM | POA: Diagnosis not present

## 2016-06-02 DIAGNOSIS — Z79899 Other long term (current) drug therapy: Secondary | ICD-10-CM | POA: Diagnosis not present

## 2016-06-02 DIAGNOSIS — Z87442 Personal history of urinary calculi: Secondary | ICD-10-CM | POA: Diagnosis not present

## 2016-06-02 MED ORDER — CIPROFLOXACIN HCL 500 MG PO TABS: 500.0000 mg | ORAL_TABLET | Freq: Two times a day (BID) | ORAL | 0 refills | Status: DC

## 2016-06-02 MED ORDER — DOXYCYCLINE HYCLATE 100 MG PO CAPS: 100.0000 mg | ORAL_CAPSULE | Freq: Two times a day (BID) | ORAL | 0 refills | Status: DC

## 2016-06-02 NOTE — Progress Notes (Signed)
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 The patient identified a palpable breast mass on the right breast. A diagnostic mammogram on 12/05/2015 revealed a 2.8 x 2.7 x 2.1 cm mass in the right upper outer quadrant of the right breast. There were prominent lymph nodes that appeared stable since 2008 imaging. A biopsy on 12/10/2015 revealed a triple negative invasive high grade ductal carcinoma of the right breast mass. She did have prominent lymph nodes in the right that were seen on previous imaging. A biopsy on 01/06/2016 of the right axillary lymph node was positive for ductal carcinoma. She completed 4 cycles of cytoxan and adriamycin with weekly Abraxane x12 on 02/11/16. Her course has been delayed by multiple hospitalizations for fever or complications with cellulitis from herpes zoster.  She presents today after her lumpectomy 05/13/2016 that confirmed invasive ductal carcinoma with clear margins. She had 1/23 positive lymph nodes, and comes to discuss the role for radiotherapy again with Dr. Lisbeth Renshaw.  On review of systems, the patient denies lymphedema. She mentions she has some pain on her back and her breasts hurt. She has clearing up of shingles on back and left breast that she has had for 3 months. She has been treated for shingles but it has not fully healed yet. She is nervous about treatment, but denies any chest pain, shortness of breath, cough, fevers, chills, night sweats, unintended weight changes. She denies any bowel or bladder disturbances, and denies abdominal pain, nausea or vomiting. She denies any new  musculoskeletal or joint aches or pains. A complete review of systems is obtained and is otherwise negative.  Past Medical History:  Past Medical History:  Diagnosis Date  . Anemia   . Atrial fibrillation ([place]) 2016  . Breast cancer ([place])   . Breast cancer of upper-outer quadrant of right female breast ([place]) 12/12/2015  . Complication of anesthesia 10-06-15    slow to awaken after   . Dysrhythmia    A FIB - followed by Dr. Meda Coffee  . Eczema   . Family history of breast cancer   . Family history of prostate cancer   . GERD (gastroesophageal reflux disease)   . Hypercholesteremia   . Hypertension   . Kidney stones   . MRSA (methicillin resistant staph aureus) culture positive 2009    none since per patient  . Paroxysmal atrial fibrillation ([place]) 10/2011, 06/2015  . PONV (postoperative nausea and vomiting) 10-06-15   admitted back to hospital for dehydration  . Vitamin D deficiency     Past Surgical History: Past Surgical History:  Procedure Laterality Date  . BREAST SURGERY Left    biopsy-neg  . CYSTOSCOPY WITH HOLMIUM LASER LITHOTRIPSY Right 10/06/2015   Procedure: CYSTOSCOPY WITH HOLMIUM LASER LITHOTRIPSY;  Surgeon: Kathie Rhodes, MD;  Location: [facility];  Service: Urology;  Laterality: Right;  . KIDNEY STONE SURGERY  2013  . NEPHROLITHOTOMY Right 10/06/2015   Procedure: NEPHROLITHOTOMY PERCUTANEOUS;  Surgeon: Kathie Rhodes, MD;  Location: [facility];  Service: Urology;  Laterality: Right;  . PORT-A-CATH REMOVAL Left 05/13/2016   Procedure: REMOVAL PORT-A-CATH;  Surgeon: Stark Klein, MD;  Location: [place];  Service: General;  Laterality: Left;  . PORTACATH PLACEMENT Left 12/23/2015   Procedure: INSERTION PORT-A-CATH WITH [place];  Surgeon: Stark Klein, MD;  Location: [facility];  Service: General;  Laterality: Left;  . RADIOACTIVE SEED GUIDED PARTIAL MASTECTOMY/AXILLARY SENTINEL NODE BIOPSY/AXILLARY NODE DISSECTION Right 05/13/2016   Procedure: RIGHT BREAST SEED LOCALIZED  LUMPECTOMY WITH AXILLARY LYMPH NODE DISSECTION;  Surgeon: Stark Klein, MD;  Location: [place];  Service: General;  Laterality: Right;    Social History:  Social History   Social History  . Marital status: Widowed    Spouse name: N/A  . Number of children: 2  . Years of education: N/A   Occupational History  . Not on file.   Social History Main Topics  . Smoking status: Never Smoker  . Smokeless tobacco: Never Used  . Alcohol use No  . Drug use: No  . Sexual activity: Not on file   Other Topics Concern  . Not on file   Social History Narrative  . No narrative on file  The patient is widowed and resides in [place]. She works for [employer].  Family History: Family History  Problem Relation Age of Onset  . Hypertension Mother   . Hepatitis C Mother   . Heart disease Father   . Dementia Father   . Aneurysm Brother   . Breast cancer Paternal Aunt     dx in her 4s  . Prostate cancer Paternal Uncle     58s  . Prostate cancer Paternal Uncle     dx in his 58s  . Leukemia Paternal Aunt   . Breast cancer Cousin     mother's paternal first cousin  . Thyroid cancer Cousin     mother's paternal first cousin    ALLERGIES:  is allergic to penicillins.  Meds: Current Outpatient Prescriptions  Medication Sig Dispense Refill  . acetaminophen (TYLENOL) 650 MG CR tablet Take 1,300 mg by mouth daily as needed for pain.     Marland Kitchen atenolol (TENORMIN) 100 MG tablet Take 1 tablet (100 mg total) by mouth daily. 30 tablet 2  . atorvastatin (LIPITOR) 80 MG tablet take 1 tablet by mouth once daily 30 tablet 3  . cholecalciferol (VITAMIN D) 1000 UNITS tablet Take 4,000 Units by mouth daily.    . clotrimazole-betamethasone (LOTRISONE) cream Apply 1 application topically 2 (two) times daily. (Patient taking differently: Apply 1 application topically daily. ) 30 g 0  . diazepam (VALIUM) 2 MG tablet Take 1 tablet (2 mg total) by mouth every 12 (twelve) hours  as needed for anxiety. 30 tablet 0  . diltiazem (CARDIZEM CD) 120 MG 24 hr capsule Take 1 capsule (120 mg total) by mouth daily. 90 capsule 3  . DULoxetine (CYMBALTA) 30 MG capsule Take 1 capsule (30 mg total) by mouth daily. 30 capsule 2  . ferrous sulfate 325 (65 FE) MG tablet Take 325 mg by mouth daily with breakfast.    . gabapentin (NEURONTIN) 100 MG capsule Take 3 capsules (300 mg total) by mouth 3 (three) times daily. 180 capsule 0  . Gauze Pads & Dressings (TEGADERM CONTACT LAYER) 5"W09" PADS 1 application by Does not apply route every other day as needed. 1 each 3  . montelukast (SINGULAIR) 10 MG tablet Take 1 tablet (10 mg total) by mouth daily. 30 tablet 2  . NYAMYC powder apply to affected area twice a day 45 g 0  . ondansetron (ZOFRAN) 8 MG tablet  Take 1 tablet (8 mg total) by mouth every 8 (eight) hours as needed for nausea or vomiting. 20 tablet 0  . PAZEO 0.7 % SOLN Place 1 drop into both eyes daily as needed (allergies).     . potassium chloride SA (K-DUR,KLOR-CON) 20 MEQ tablet Take 1 tablet (20 mEq total) by mouth 2 (two) times daily. 60 tablet 6  . rivaroxaban (XARELTO) 20 MG TABS tablet Take 1 tablet (20 mg total) by mouth daily with supper. 90 tablet 3  . prochlorperazine (COMPAZINE) 10 MG tablet Take 1 tablet (10 mg total) by mouth every 6 (six) hours as needed (Nausea or vomiting). (Patient not taking: Reported on 06/02/2016) 30 tablet 1  . triamterene-hydrochlorothiazide (MAXZIDE-25) 37.5-25 MG tablet Take 1 tablet by mouth daily.    Marland Kitchen triamterene-hydrochlorothiazide (MAXZIDE-25) 37.5-25 MG tablet take 1 tablet by mouth once daily (Patient not taking: Reported on 06/02/2016) 30 tablet 3   No current facility-administered medications for this encounter.     Physical Findings:  height is '5\' 2"'$  (1.575 m) and weight is 160 lb 4.8 oz (72.7 kg). Her oral temperature is 98.3 F (36.8 C). Her blood pressure is 109/73 and her pulse is 70. Her respiration is 16.  In general this is  a well appearing African-American female in no acute distress. She's alert and oriented x4 and appropriate throughout the examination. Cardiopulmonary assessment is negative for acute distress and she exhibits normal effort. Right breast is evaluated and postsurgical changes are noted consistent with previous lumpectomy. Incision is well-healed but there is induration and palpable heat of her incision site. No drainage is appreciated. Axillary incision is also well-healed. The left chest wall anteriorly and posteriorly are bandaged covering herpetic lesions. These are not disrupted.   Lab Findings: Lab Results  Component Value Date   WBC 5.4 04/12/2016   HGB 10.6 (L) 04/12/2016   HCT 34.0 (L) 04/12/2016   MCV 89.2 04/12/2016   PLT 285 04/12/2016     Radiographic Findings: Dg Chest 2 View  Result Date: 05/16/2016 CLINICAL DATA:  Fever.  Cancer. EXAM: CHEST  2 VIEW COMPARISON:  04/08/2016 FINDINGS: Limited lateral view anteriorly as the patient can not lift her right arm after recent surgery. Postoperative changes to the right chest including surgical drain. The left-sided porta catheter has been removed since previous. Normal heart size and mediastinal contours. No acute infiltrate or edema. No effusion or pneumothorax. No acute osseous findings. Subtle nodular density over the right mid chest may reflect a subpleural nodule seen on 01/30/2016 chest CT IMPRESSION: No evidence of pneumonia. Electronically Signed   By: Monte Fantasia M.D.   On: 05/16/2016 19:56  Ct Angio Chest Pe W And/or Wo Contrast  Result Date: 05/16/2016 CLINICAL DATA:  60 year old female with shortness of breath and chest pain. History of breast cancer. EXAM: CT ANGIOGRAPHY CHEST WITH CONTRAST TECHNIQUE: Multidetector CT imaging of the chest was performed using the standard protocol during bolus administration of intravenous contrast. Multiplanar CT image reconstructions and MIPs were obtained to evaluate the vascular  anatomy. CONTRAST:  100 cc Isovue 370 COMPARISON:  Chest radiograph dated 05/16/2016 and CT dated 01/30/2016 FINDINGS: Left lung base linear atelectasis/ scarring noted. There are small areas of air trapping throughout the lungs which may be related to underlying small vessel versus small airway disease. There is no focal consolidation, pleural effusion, or pneumothorax. There is a 4 mm subpleural nodule in the right upper lobe (series 2 image 4) which is grossly similar or minimally  increased in size compared to the prior study. This is not felt to represent metastatic disease. Continued follow-up recommended. The central airways are patent. The thoracic aorta appears unremarkable the origins of the great vessels of the aortic arch appear patent. Evaluation of the pulmonary arteries is limited due to suboptimal opacification of the peripheral branches as well as due to respiratory motion artifact. No central pulmonary artery embolus identified. Top-normal cardiac size. No pericardial effusion. There is no hilar or mediastinal adenopathy. Esophagus is grossly unremarkable. No thyroid nodules identified. There is postsurgical changes of right axilla with removal of the previously seen right axillary mass. There is diffuse stranding of the soft tissues of the right lateral chest wall and right axilla with multiple surgical clips. A drainage catheter is seen extending from the right lateral chest wall to the soft tissues deep to the pectoralis minor. No drainable fluid collection or abscess identified. Multiple small pockets of gas noted in the soft tissues of the right anterior chest wall deep to the pectoralis minor. Small pocket of gas in the subcutaneous soft tissues of the left anterior chest wall likely related to recent surgery. A 2.5 x 2.6 cm nodular density with interspersed fat in the left breast similar to prior study may represent breast tissue. A mass is not excluded. Correlation with clinical exam and  mammographic studies recommended. There is degenerative changes of the spine with osteophyte. No definite evidence of osseous metastasis. No acute fracture. A stable 1.3 cm hypodense lesion in the left lobe of the liver superiorly is not well characterized on the CT but may represent a cyst or hemangioma. The visualized upper abdomen appears unremarkable. Review of the MIP images confirms the above findings. IMPRESSION: No CT evidence of central pulmonary embolism. Mild interstitial lung disease. No definite CT evidence of pulmonary or osseous metastatic disease. A 4 mm right upper lobe subpleural nodule grossly similar or minimally increased in size compared to the prior study. This is not felt to represent metastatic disease. Follow-up as clinically indicated. Interval incisional removal of the right axillary lesion with postsurgical changes and drain placement. No fluid collection or abscess identified. Electronically Signed   By: Anner Crete M.D.   On: 05/16/2016 23:32  Mm Breast Surgical Specimen  Result Date: 05/13/2016 CLINICAL DATA:  60 year old female post right breast lumpectomy. EXAM: SPECIMEN RADIOGRAPH OF THE RIGHT BREAST COMPARISON:  Previous exam(s). FINDINGS: Status post excision of the right breast. The radioactive seed and biopsy marker clip are present, completely intact, and were marked for pathology. IMPRESSION: Specimen radiograph of the right breast. Electronically Signed   By: Everlean Alstrom M.D.   On: 05/13/2016 08:34  Mm Rt Radioactive Seed Loc Mammo Guide  Result Date: 05/11/2016 CLINICAL DATA:  Preoperative localization for removal of right breast mass. EXAM: MAMMOGRAPHIC GUIDED RADIOACTIVE SEED LOCALIZATION OF THE RIGHT BREAST COMPARISON:  Previous exam(s). FINDINGS: Patient presents for radioactive seed localization prior to surgical excision of right breast mass. I met with the patient and we discussed the procedure of seed localization including benefits and  alternatives. We discussed the high likelihood of a successful procedure. We discussed the risks of the procedure including infection, bleeding, tissue injury and further surgery. We discussed the low dose of radioactivity involved in the procedure. Informed, written consent was given. The usual time-out protocol was performed immediately prior to the procedure. Using mammographic guidance, sterile technique, 1% lidocaine and an I-125 radioactive seed, the clip and mass or localized using a lateral approach. The  follow-up mammogram images confirm the seed in the expected location and were marked for Dr. Barry Dienes. Follow-up survey of the patient confirms presence of the radioactive seed. Order number of I-125 seed:  [id number]. Total activity:  3.735 millicuries  Reference Date: 04/08/2016 The patient tolerated the procedure well and was released from the [facility]. She was given instructions regarding seed removal. IMPRESSION: Radioactive seed localization right breast. No apparent complications. Electronically Signed   By: Altamese Cabal M.D.   On: 05/11/2016 13:54   Impression/Plan: 1. 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 Dr. Lisbeth Renshaw discusses the pathology findings and reviews the nature of invasive disease. The consensus from the breast conference moving forward now with  external radiotherapy to the breast. We discussed the risks, benefits, short, and long term effects of radiotherapy, and the patient is interested in proceeding. Dr. Lisbeth Renshaw discusses the delivery and logistics of radiotherapy. He recommends proceeding with 4 weeks of treatment with high tangents to cover the axillary node involved and we would plan for 20 fractions during this time. We will see her back on 06/16/16 for simulation. 2. Mastitis. She seems to have an infection at the incision site since it is warm to the touch with induration I will prescribe her Cipro for 10 days to take and  finish the course before we start treatment. 3. Herpes Zoster/Shingles. The patient will follow up with Dr. Lindi Adie for this and for further treatment. This should not interfere with her plans for radiotherapy.    The above documentation reflects my direct findings during this shared patient visit. Please see the separate note by Dr. Lisbeth Renshaw on this date for the remainder of the patient's plan of care.     Carola Rhine, PAC     This document serves as a record of services personally performed by Shona Simpson, PAC. It was created on her behalf by Lendon Collar, a trained medical scribe. The creation of this record is based on the scribe's personal observations and the provider's statements to them. This document has been checked and approved by the attending provider.

## 2016-06-02 NOTE — Progress Notes (Signed)
Please see the Nurse Progress Note in the MD Initial Consult Encounter for this patient. 

## 2016-06-03 ENCOUNTER — Encounter: Payer: Self-pay | Admitting: *Deleted

## 2016-06-03 ENCOUNTER — Encounter: Payer: Self-pay | Admitting: General Practice

## 2016-06-03 NOTE — Progress Notes (Signed)
Spiritual Care Note  Followed up with [name] by phone as planned.  Updated her on referral placed by [name] per Dr Lindi Adie to [facility] for targeted counseling/med support to help pt cope with feelings of depressed mood, worry, and multiple stressors.  Pt verbalized desire to continue to receive Spiritual Care as well.  Reassured her of ongoing chaplain support.    [name], [place], [facility] Pager [phone] Voicemail [phone]

## 2016-06-03 NOTE — Progress Notes (Signed)
[place] Work  Clinical Social Work phoned pt to offer support and assess for needs. Pt and daughter had met with financial advocate and pt was approved for the [employer]. Pt and CSW to meet after SIM on 06/16/16 to review additional assistance and support options. CSW to follow and assist.   Loren Racer, [place] Worker [place]  [facility] Phone: [phone] Fax: [phone]

## 2016-06-07 ENCOUNTER — Telehealth: Payer: Self-pay

## 2016-06-07 ENCOUNTER — Other Ambulatory Visit: Payer: Self-pay

## 2016-06-07 ENCOUNTER — Encounter: Payer: Self-pay | Admitting: Internal Medicine

## 2016-06-07 MED ORDER — VALACYCLOVIR HCL 1 G PO TABS: 1000.0000 mg | ORAL_TABLET | Freq: Two times a day (BID) | ORAL | 0 refills | Status: DC

## 2016-06-07 NOTE — Telephone Encounter (Signed)
Received message from pt's daughter, Levada Dy, who is concerned about her mother's pain and non-healing shingles.  Called Levada Dy back to discuss.  According to Levada Dy, pt has had singles for several months now and the lesions on her back remain unhealed and possibly infected.  Levada Dy stated that radiation has been placed on hold until lesions heal and pt was started on Cipro x 10 days for potential infection of these lesions.  Levada Dy states she is giving her mother 100mg  gabapentin TID.  I discussed with her that she could increase this to 300mg  TID to help with nerve pain r/t shingles.  Upon chart review pt has had referral placed to wound care center for shingle lesions.  I discussed with Levada Dy that this would be the best place for her mother to go in regards to shingles as they will have many options available to help in the healing process of her mothers wound.  Levada Dy in agreement and verbalized understanding to follow up on this referral.  Dr. Lindi Adie also wants pt to take Valtrex 1000mg  BID x 30 days.  All information discussed with Levada Dy and all questions and concerns addressed.  Pt's family and pt encouraged to reach out to [place] with any additional questions or concerns, should they arise.

## 2016-06-11 ENCOUNTER — Other Ambulatory Visit: Payer: Self-pay | Admitting: General Surgery

## 2016-06-11 ENCOUNTER — Encounter: Payer: Self-pay | Admitting: Internal Medicine

## 2016-06-11 DIAGNOSIS — D493 Neoplasm of unspecified behavior of breast: Secondary | ICD-10-CM

## 2016-06-12 ENCOUNTER — Other Ambulatory Visit: Payer: Self-pay | Admitting: Internal Medicine

## 2016-06-14 ENCOUNTER — Ambulatory Visit
Admission: RE | Admit: 2016-06-14 | Discharge: 2016-06-14 | Disposition: A | Discharge status: Home / Self Care | Payer: BC Managed Care – PPO | Source: Ambulatory Visit | Attending: General Surgery | Admitting: General Surgery

## 2016-06-14 ENCOUNTER — Other Ambulatory Visit: Payer: Self-pay | Admitting: General Surgery

## 2016-06-14 DIAGNOSIS — D493 Neoplasm of unspecified behavior of breast: Secondary | ICD-10-CM

## 2016-06-14 DIAGNOSIS — N644 Mastodynia: Secondary | ICD-10-CM

## 2016-06-16 ENCOUNTER — Ambulatory Visit
Admission: RE | Admit: 2016-06-16 | Discharge: 2016-06-16 | Disposition: A | Discharge status: Home / Self Care | Payer: BC Managed Care – PPO | Source: Ambulatory Visit | Attending: Radiation Oncology | Admitting: Radiation Oncology

## 2016-06-16 ENCOUNTER — Encounter: Payer: Self-pay | Admitting: *Deleted

## 2016-06-16 DIAGNOSIS — C50411 Malignant neoplasm of upper-outer quadrant of right female breast: Secondary | ICD-10-CM | POA: Diagnosis not present

## 2016-06-16 NOTE — Progress Notes (Signed)
[place] Work  Clinical Social Work met with pt as planned for assessment of psychosocial needs.  Clinical Social Worker met with patient at [facility] to offer support and assess for needs.  Pt reports she was approved for the grant through [place]. She shared with CSW that she was having some ongoing financial concerns due to less income as she is now on STD. CSW reviewed additional resources of Cancer Care and [facility] [place]. CSW provided handouts on both resources for pt to pursue. Pt shared she thought she was approved for [facility] [place] with her medical bills. Pt shared her daughter had paid several of her bills while she was in the hospital. She reports her daughter has mental health concerns and she does not want access to her bank account. Pt reports she had tried to remove her daughter off her bank account, but the bank would not let her proceed. CSW problem solved with pt for other ways to deal with this concern. Pt may consider closing that account and reopening another one. Pt is interested in completing ADRs and CSW provided her with a packet. Pt plans to contact CSW to make appt to complete ADRs at later date.    Clinical Social Work interventions: Supportive Psychiatric nurse education and referral  Loren Racer, [place] Worker [place]  [place] Phone: [phone] Fax: [phone]

## 2016-06-17 ENCOUNTER — Other Ambulatory Visit: Payer: BC Managed Care – PPO

## 2016-06-17 NOTE — Progress Notes (Signed)
  Radiation Oncology         [phone] ________________________________  Name: [name] MRN: [id number]  Date: 06/16/2016  DOB: [birth year]   DIAGNOSIS:     ICD-9-CM ICD-10-CM   1. Breast cancer of upper-outer quadrant of right female breast ([place]) 174.4 C50.411     SIMULATION AND TREATMENT PLANNING NOTE  The patient presented for simulation prior to beginning her course of radiation treatment for her diagnosis of right-sided breast cancer. The patient was placed in a supine position on a breast board. A customized vac-lock bag was constructed and this complex treatment device will be used on a daily basis during her treatment. In this fashion, a CT scan was obtained through the chest area and an isocenter was placed near the chest wall within the breast.  The patient will be planned to receive a course of radiation initially to a dose of 42.5 Gy. This will consist of a whole breast radiotherapy technique. To accomplish this, 2 customized blocks have been designed which will correspond to medial and lateral whole breast tangent fields. This treatment will be accomplished at 2.5 Gy per fraction. A forward planning technique will also be evaluated to determine if this approach improves the plan. It is anticipated that the patient will then receive a 7.5 Gy boost to the seroma cavity which has been contoured. This will be accomplished at 2.5 Gy per fraction.   This initial treatment will consist of a 3-D conformal technique. The seroma has been contoured as the primary target structure. Additionally, dose volume histograms of both this target as well as the lungs and heart will also be evaluated. Such an approach is necessary to ensure that the target area is adequately covered while the nearby critical  normal structures are adequately spared.  Plan:  The final anticipated total dose therefore will correspond to 50 Gy.    _______________________________   Jodelle Gross, MD, PhD

## 2016-06-17 NOTE — Progress Notes (Signed)
  Radiation Oncology         [phone] ________________________________  Name: [name] MRN: [id number]  Date: 06/16/2016  DOB: [birth year]  Optical Surface Tracking Plan:  Since intensity modulated radiotherapy (IMRT) and 3D conformal radiation treatment methods are predicated on accurate and precise positioning for treatment, intrafraction motion monitoring is medically necessary to ensure accurate and safe treatment delivery.  The ability to quantify intrafraction motion without excessive ionizing radiation dose can only be performed with optical surface tracking. Accordingly, surface imaging offers the opportunity to obtain 3D measurements of patient position throughout IMRT and 3D treatments without excessive radiation exposure.  I am ordering optical surface tracking for this patient's upcoming course of radiotherapy. ________________________________  Kyung Rudd, MD 06/17/2016 8:44 PM    Reference:   Particia Jasper, et al. Surface imaging-based analysis of intrafraction motion for breast radiotherapy patients.Journal of [place], n. 6, nov. 2014. ISSN [id number].   Available at: <http://www.jacmp.org/index.php/jacmp/article/view/4957>.

## 2016-06-18 DIAGNOSIS — C50411 Malignant neoplasm of upper-outer quadrant of right female breast: Secondary | ICD-10-CM | POA: Diagnosis not present

## 2016-06-24 ENCOUNTER — Ambulatory Visit
Admission: RE | Admit: 2016-06-24 | Discharge: 2016-06-24 | Disposition: A | Discharge status: Home / Self Care | Payer: BC Managed Care – PPO | Source: Ambulatory Visit | Attending: Radiation Oncology | Admitting: Radiation Oncology

## 2016-06-24 DIAGNOSIS — C50411 Malignant neoplasm of upper-outer quadrant of right female breast: Secondary | ICD-10-CM | POA: Diagnosis not present

## 2016-06-28 ENCOUNTER — Telehealth: Payer: Self-pay | Admitting: *Deleted

## 2016-06-28 ENCOUNTER — Ambulatory Visit
Admission: RE | Admit: 2016-06-28 | Discharge: 2016-06-28 | Disposition: A | Discharge status: Home / Self Care | Payer: BC Managed Care – PPO | Source: Ambulatory Visit | Attending: Radiation Oncology | Admitting: Radiation Oncology

## 2016-06-28 DIAGNOSIS — C50411 Malignant neoplasm of upper-outer quadrant of right female breast: Secondary | ICD-10-CM | POA: Diagnosis not present

## 2016-06-28 NOTE — Telephone Encounter (Signed)
Call from [facility] asking what dose of neulasta was given on January 14, 2016 and January 15, 2016.  Advised the 29 th Neulasta 6 mg was an on body injector.  Injector fell off.  Patient was brought in on the 30 th and received Neulasta 6 mg subcutaneously.  No further questions.

## 2016-06-29 ENCOUNTER — Ambulatory Visit
Admission: RE | Admit: 2016-06-29 | Discharge: 2016-06-29 | Disposition: A | Discharge status: Home / Self Care | Payer: BC Managed Care – PPO | Source: Ambulatory Visit | Attending: Radiation Oncology | Admitting: Radiation Oncology

## 2016-06-29 DIAGNOSIS — C50411 Malignant neoplasm of upper-outer quadrant of right female breast: Secondary | ICD-10-CM | POA: Diagnosis not present

## 2016-06-29 MED ORDER — ALRA NON-METALLIC DEODORANT (RAD-ONC)
1.0000 "application " | Freq: Once | TOPICAL | Status: AC
  Administered 2016-06-29: 1 via TOPICAL

## 2016-06-29 MED ORDER — RADIAPLEXRX EX GEL
Freq: Once | CUTANEOUS | Status: AC
  Administered 2016-06-29: 14:00:00 via TOPICAL

## 2016-06-29 MED ORDER — ALRA NON-METALLIC DEODORANT (RAD-ONC): 1.0000 "application " | Freq: Once | TOPICAL | Status: DC

## 2016-06-29 MED ORDER — RADIAPLEXRX EX GEL: Freq: Once | CUTANEOUS | Status: DC

## 2016-06-29 NOTE — Progress Notes (Signed)
Patient education done, my business card,radiaplex and alra deodorant, Radiation therapy  and you book, discussed ways to manage side effects, skin irritation,pain, fatigue, swelling/soreness ,use of electric shaver only, increase protein and stay hydrated, drink plenty water, apply radiaplex to right breast after rad tx and bedtime, sees MD and staffRn weekly and prn, verbal understanding, teach back given 2:10 PM

## 2016-06-30 ENCOUNTER — Ambulatory Visit
Admission: RE | Admit: 2016-06-30 | Discharge: 2016-06-30 | Disposition: A | Discharge status: Home / Self Care | Payer: BC Managed Care – PPO | Source: Ambulatory Visit | Attending: Radiation Oncology | Admitting: Radiation Oncology

## 2016-06-30 DIAGNOSIS — C50411 Malignant neoplasm of upper-outer quadrant of right female breast: Secondary | ICD-10-CM | POA: Diagnosis not present

## 2016-07-01 ENCOUNTER — Ambulatory Visit
Admission: RE | Admit: 2016-07-01 | Discharge: 2016-07-01 | Disposition: A | Discharge status: Home / Self Care | Payer: BC Managed Care – PPO | Source: Ambulatory Visit | Attending: Radiation Oncology | Admitting: Radiation Oncology

## 2016-07-01 DIAGNOSIS — C50411 Malignant neoplasm of upper-outer quadrant of right female breast: Secondary | ICD-10-CM | POA: Diagnosis not present

## 2016-07-02 ENCOUNTER — Ambulatory Visit
Admission: RE | Admit: 2016-07-02 | Discharge: 2016-07-02 | Disposition: A | Discharge status: Home / Self Care | Payer: BC Managed Care – PPO | Source: Ambulatory Visit | Attending: Radiation Oncology | Admitting: Radiation Oncology

## 2016-07-02 ENCOUNTER — Encounter: Payer: Self-pay | Admitting: Radiation Oncology

## 2016-07-02 VITALS — BP 115/72 | HR 73 | Temp 98.2°F | Resp 16 | Ht 62.0 in | Wt 154.0 lb

## 2016-07-02 DIAGNOSIS — C50411 Malignant neoplasm of upper-outer quadrant of right female breast: Secondary | ICD-10-CM | POA: Diagnosis not present

## 2016-07-02 NOTE — Progress Notes (Signed)
[name] has received 5 fractions to right breast.  Skin to right breast with hyperpigmentation, using Radiaplex gel bid.  Appetite is good.  Having fatigue during the afternoon.  Denies pain. Wt Readings from Last 3 Encounters:  07/02/16 154 lb (69.9 kg)  06/02/16 160 lb 4.8 oz (72.7 kg)  05/21/16 161 lb 6.4 oz (73.2 kg)  BP 115/72 (BP Location: Left Arm, Patient Position: Sitting, Cuff Size: Normal)   Pulse 73   Temp 98.2 F (36.8 C) (Oral)   Resp 16   Ht 5\' 2"  (1.575 m)   Wt 154 lb (69.9 kg)   SpO2 100%   BMI 28.17 kg/m

## 2016-07-02 NOTE — Progress Notes (Signed)
Department of Radiation Oncology  Phone:  [phone] Fax:        [phone]  Weekly Treatment Note    Name: [name] Date: 07/02/2016 MRN: [id number] DOB: 1956-02-04   Diagnosis:     ICD-9-CM ICD-10-CM   1. Breast cancer of upper-outer quadrant of right female breast ([place]) 174.4 C50.411      Current dose: 12.5 Gy  Current fraction: 5   MEDICATIONS: Current Outpatient Prescriptions  Medication Sig Dispense Refill  . acetaminophen (TYLENOL) 650 MG CR tablet Take 1,300 mg by mouth daily as needed for pain.     Marland Kitchen atenolol (TENORMIN) 100 MG tablet Take 1 tablet (100 mg total) by mouth daily. 30 tablet 2  . atorvastatin (LIPITOR) 80 MG tablet take 1 tablet by mouth once daily 30 tablet 3  . cholecalciferol (VITAMIN D) 1000 UNITS tablet Take 4,000 Units by mouth daily.    . ciprofloxacin (CIPRO) 500 MG tablet Take 1 tablet (500 mg total) by mouth 2 (two) times daily. 20 tablet 0  . clotrimazole-betamethasone (LOTRISONE) cream Apply 1 application topically 2 (two) times daily. (Patient taking differently: Apply 1 application topically daily. ) 30 g 0  . diazepam (VALIUM) 2 MG tablet take 1 tablet by mouth every 12 hours if needed 30 tablet 0  . diltiazem (CARDIZEM CD) 120 MG 24 hr capsule Take 1 capsule (120 mg total) by mouth daily. 90 capsule 3  . doxycycline (VIBRAMYCIN) 100 MG capsule Take 1 capsule (100 mg total) by mouth 2 (two) times daily. One po bid x 7 days 14 capsule 0  . DULoxetine (CYMBALTA) 30 MG capsule Take 1 capsule (30 mg total) by mouth daily. 30 capsule 2  . ferrous sulfate 325 (65 FE) MG tablet Take 325 mg by mouth daily with breakfast.    . gabapentin (NEURONTIN) 100 MG capsule Take 3 capsules (300 mg total) by mouth 3 (three) times daily. 180 capsule 0  . Gauze Pads & Dressings (TEGADERM CONTACT LAYER) [id number]" PADS 1 application by Does not apply route every other day as needed. 1 each 3  . loperamide (IMODIUM A-D) 2 MG tablet Take 2 mg by mouth  daily as needed for diarrhea or loose stools.    . montelukast (SINGULAIR) 10 MG tablet Take 1 tablet (10 mg total) by mouth daily. 30 tablet 2  . NYAMYC powder apply to affected area twice a day 45 g 0  . ondansetron (ZOFRAN) 8 MG tablet Take 1 tablet (8 mg total) by mouth every 8 (eight) hours as needed for nausea or vomiting. 20 tablet 0  . PAZEO 0.7 % SOLN Place 1 drop into both eyes daily as needed (allergies).     . potassium chloride SA (K-DUR,KLOR-CON) 20 MEQ tablet Take 1 tablet (20 mEq total) by mouth 2 (two) times daily. 60 tablet 6  . prochlorperazine (COMPAZINE) 10 MG tablet Take 1 tablet (10 mg total) by mouth every 6 (six) hours as needed (Nausea or vomiting). 30 tablet 1  . rivaroxaban (XARELTO) 20 MG TABS tablet Take 1 tablet (20 mg total) by mouth daily with supper. 90 tablet 3  . triamterene-hydrochlorothiazide (MAXZIDE-25) 37.5-25 MG tablet Take 1 tablet by mouth daily.    Marland Kitchen triamterene-hydrochlorothiazide (MAXZIDE-25) 37.5-25 MG tablet take 1 tablet by mouth once daily 30 tablet 3  . valACYclovir (VALTREX) 1000 MG tablet Take 1 tablet (1,000 mg total) by mouth 2 (two) times daily. 60 tablet 0   No current facility-administered medications for this encounter.  ALLERGIES: Penicillins   LABORATORY DATA:  Lab Results  Component Value Date   WBC 5.4 04/12/2016   HGB 10.6 (L) 04/12/2016   HCT 34.0 (L) 04/12/2016   MCV 89.2 04/12/2016   PLT 285 04/12/2016   Lab Results  Component Value Date   NA 139 05/16/2016   K 4.0 05/16/2016   CL 104 05/16/2016   CO2 26 05/16/2016   Lab Results  Component Value Date   ALT 16 05/16/2016   AST 24 05/16/2016   ALKPHOS 72 05/16/2016   BILITOT 0.3 05/16/2016     NARRATIVE: [name] was seen today for weekly treatment management. The chart was checked and the patient's films were reviewed.  [name] has received 5 fractions to right breast.  Skin to right breast with hyperpigmentation, using Radiaplex gel  bid.  Appetite is good.  Having fatigue during the afternoon.  Denies pain. Wt Readings from Last 3 Encounters:  07/02/16 154 lb (69.9 kg)  06/02/16 160 lb 4.8 oz (72.7 kg)  05/21/16 161 lb 6.4 oz (73.2 kg)  BP 115/72 (BP Location: Left Arm, Patient Position: Sitting, Cuff Size: Normal)   Pulse 73   Temp 98.2 F (36.8 C) (Oral)   Resp 16   Ht 5\' 2"  (1.575 m)   Wt 154 lb (69.9 kg)   SpO2 100%   BMI 28.17 kg/m   PHYSICAL EXAMINATION: height is 5\' 2"  (1.575 m) and weight is 154 lb (69.9 kg). Her oral temperature is 98.2 F (36.8 C). Her blood pressure is 115/72 and her pulse is 73. Her respiration is 16 and oxygen saturation is 100%.        ASSESSMENT: The patient is doing satisfactorily with treatment.  PLAN: We will continue with the patient's radiation treatment as planned.

## 2016-07-05 ENCOUNTER — Telehealth: Payer: Self-pay | Admitting: *Deleted

## 2016-07-05 ENCOUNTER — Ambulatory Visit
Admission: RE | Admit: 2016-07-05 | Discharge: 2016-07-05 | Disposition: A | Discharge status: Home / Self Care | Payer: BC Managed Care – PPO | Source: Ambulatory Visit | Attending: Radiation Oncology | Admitting: Radiation Oncology

## 2016-07-05 DIAGNOSIS — C50411 Malignant neoplasm of upper-outer quadrant of right female breast: Secondary | ICD-10-CM | POA: Diagnosis not present

## 2016-07-05 NOTE — Telephone Encounter (Signed)
  Oncology Nurse Navigator Documentation  Navigator Location: [facility] Onc (07/05/16 1500) Navigator Encounter Type: Telephone (07/05/16 1500) Telephone: Outgoing Call (07/05/16 1500)         Patient Visit Type: RadOnc (07/05/16 1500) Treatment Phase: First Radiation Tx (07/05/16 1500)  Relate doing well and without complaints. Encourage pt to call with needs. Received verbal understanding.                          Time Spent with Patient: 15 (07/05/16 1500)

## 2016-07-06 ENCOUNTER — Ambulatory Visit
Admission: RE | Admit: 2016-07-06 | Discharge: 2016-07-06 | Disposition: A | Discharge status: Home / Self Care | Payer: BC Managed Care – PPO | Source: Ambulatory Visit | Attending: Radiation Oncology | Admitting: Radiation Oncology

## 2016-07-06 DIAGNOSIS — C50411 Malignant neoplasm of upper-outer quadrant of right female breast: Secondary | ICD-10-CM | POA: Diagnosis not present

## 2016-07-07 ENCOUNTER — Ambulatory Visit
Admission: RE | Admit: 2016-07-07 | Discharge: 2016-07-07 | Disposition: A | Discharge status: Home / Self Care | Payer: BC Managed Care – PPO | Source: Ambulatory Visit | Attending: Radiation Oncology | Admitting: Radiation Oncology

## 2016-07-07 ENCOUNTER — Encounter: Payer: Self-pay | Admitting: Radiation Oncology

## 2016-07-07 DIAGNOSIS — C50411 Malignant neoplasm of upper-outer quadrant of right female breast: Secondary | ICD-10-CM | POA: Diagnosis not present

## 2016-07-08 ENCOUNTER — Ambulatory Visit
Admission: RE | Admit: 2016-07-08 | Discharge: 2016-07-08 | Disposition: A | Discharge status: Home / Self Care | Payer: BC Managed Care – PPO | Source: Ambulatory Visit | Attending: Radiation Oncology | Admitting: Radiation Oncology

## 2016-07-08 DIAGNOSIS — C50411 Malignant neoplasm of upper-outer quadrant of right female breast: Secondary | ICD-10-CM | POA: Diagnosis not present

## 2016-07-09 ENCOUNTER — Ambulatory Visit
Admission: RE | Admit: 2016-07-09 | Discharge: 2016-07-09 | Disposition: A | Discharge status: Home / Self Care | Payer: BC Managed Care – PPO | Source: Ambulatory Visit | Attending: Radiation Oncology | Admitting: Radiation Oncology

## 2016-07-09 ENCOUNTER — Encounter: Payer: Self-pay | Admitting: Radiation Oncology

## 2016-07-09 VITALS — BP 116/69 | HR 64 | Temp 98.0°F | Resp 16 | Ht 62.0 in | Wt 153.5 lb

## 2016-07-09 DIAGNOSIS — C50411 Malignant neoplasm of upper-outer quadrant of right female breast: Secondary | ICD-10-CM

## 2016-07-09 NOTE — Progress Notes (Signed)
Mrs. Alva has received 10 fractions to right breast.  Skin to right breast with hyperpigmentation, using Radiaplex gel bid. Shingle areas on the left chest breast and back without any drainage healing.  Appetite is good.  Having fatigue during the afternoon.  Denies pain. Wt Readings from Last 3 Encounters:  07/09/16 153 lb 8 oz (69.6 kg)  07/02/16 154 lb (69.9 kg)  06/02/16 160 lb 4.8 oz (72.7 kg)  BP 116/69 (BP Location: Left Arm, Patient Position: Sitting, Cuff Size: Normal)   Pulse 64   Temp 98 F (36.7 C) (Oral)   Resp 16   Ht 5\' 2"  (1.575 m)   Wt 153 lb 8 oz (69.6 kg)   SpO2 100%   BMI 28.08 kg/m

## 2016-07-10 NOTE — Progress Notes (Signed)
Department of Radiation Oncology  Phone:  [phone] Fax:        [phone]  Weekly Treatment Note    Name: [name] Date: 07/10/2016 MRN: [id number] DOB: May 26, 1956   Diagnosis:     ICD-9-CM ICD-10-CM   1. Breast cancer of upper-outer quadrant of right female breast ([place]) 174.4 C50.411      Current dose: 25 Gy  Current fraction: 10   MEDICATIONS: Current Outpatient Prescriptions  Medication Sig Dispense Refill  . atenolol (TENORMIN) 100 MG tablet Take 1 tablet (100 mg total) by mouth daily. 30 tablet 2  . atorvastatin (LIPITOR) 80 MG tablet take 1 tablet by mouth once daily 30 tablet 3  . cholecalciferol (VITAMIN D) 1000 UNITS tablet Take 4,000 Units by mouth daily.    . clotrimazole-betamethasone (LOTRISONE) cream Apply 1 application topically 2 (two) times daily. (Patient taking differently: Apply 1 application topically daily. ) 30 g 0  . diazepam (VALIUM) 2 MG tablet take 1 tablet by mouth every 12 hours if needed 30 tablet 0  . diltiazem (CARDIZEM CD) 120 MG 24 hr capsule Take 1 capsule (120 mg total) by mouth daily. 90 capsule 3  . DULoxetine (CYMBALTA) 30 MG capsule Take 1 capsule (30 mg total) by mouth daily. 30 capsule 2  . ferrous sulfate 325 (65 FE) MG tablet Take 325 mg by mouth daily with breakfast.    . gabapentin (NEURONTIN) 100 MG capsule Take 3 capsules (300 mg total) by mouth 3 (three) times daily. 180 capsule 0  . montelukast (SINGULAIR) 10 MG tablet Take 1 tablet (10 mg total) by mouth daily. 30 tablet 2  . PAZEO 0.7 % SOLN Place 1 drop into both eyes daily as needed (allergies).     . potassium chloride SA (K-DUR,KLOR-CON) 20 MEQ tablet Take 1 tablet (20 mEq total) by mouth 2 (two) times daily. 60 tablet 6  . rivaroxaban (XARELTO) 20 MG TABS tablet Take 1 tablet (20 mg total) by mouth daily with supper. 90 tablet 3  . triamterene-hydrochlorothiazide (MAXZIDE-25) 37.5-25 MG tablet Take 1 tablet by mouth daily.    Marland Kitchen  triamterene-hydrochlorothiazide (MAXZIDE-25) 37.5-25 MG tablet take 1 tablet by mouth once daily 30 tablet 3  . valACYclovir (VALTREX) 1000 MG tablet Take 1 tablet (1,000 mg total) by mouth 2 (two) times daily. 60 tablet 0  . acetaminophen (TYLENOL) 650 MG CR tablet Take 1,300 mg by mouth daily as needed for pain.     . Gauze Pads & Dressings (TEGADERM CONTACT LAYER) [id number]" PADS 1 application by Does not apply route every other day as needed. (Patient not taking: Reported on 07/09/2016) 1 each 3  . loperamide (IMODIUM A-D) 2 MG tablet Take 2 mg by mouth daily as needed for diarrhea or loose stools.    Marland Kitchen NYAMYC powder apply to affected area twice a day (Patient not taking: Reported on 07/09/2016) 45 g 0  . ondansetron (ZOFRAN) 8 MG tablet Take 1 tablet (8 mg total) by mouth every 8 (eight) hours as needed for nausea or vomiting. (Patient not taking: Reported on 07/09/2016) 20 tablet 0  . prochlorperazine (COMPAZINE) 10 MG tablet Take 1 tablet (10 mg total) by mouth every 6 (six) hours as needed (Nausea or vomiting). (Patient not taking: Reported on 07/09/2016) 30 tablet 1   No current facility-administered medications for this encounter.      ALLERGIES: Penicillins   LABORATORY DATA:  Lab Results  Component Value Date   WBC 5.4 04/12/2016   HGB 10.6 (  L) 04/12/2016   HCT 34.0 (L) 04/12/2016   MCV 89.2 04/12/2016   PLT 285 04/12/2016   Lab Results  Component Value Date   NA 139 05/16/2016   K 4.0 05/16/2016   CL 104 05/16/2016   CO2 26 05/16/2016   Lab Results  Component Value Date   ALT 16 05/16/2016   AST 24 05/16/2016   ALKPHOS 72 05/16/2016   BILITOT 0.3 05/16/2016     NARRATIVE: [name] was seen today for weekly treatment management. The chart was checked and the patient's films were reviewed.  Mrs. Oberbroeckling has received 10 fractions to right breast.  Skin to right breast with hyperpigmentation, using Radiaplex gel bid. Shingle areas on the left chest breast and back  without any drainage healing.  Appetite is good.  Having fatigue during the afternoon.  Denies pain. Wt Readings from Last 3 Encounters:  07/09/16 153 lb 8 oz (69.6 kg)  07/02/16 154 lb (69.9 kg)  06/02/16 160 lb 4.8 oz (72.7 kg)  BP 116/69 (BP Location: Left Arm, Patient Position: Sitting, Cuff Size: Normal)   Pulse 64   Temp 98 F (36.7 C) (Oral)   Resp 16   Ht 5\' 2"  (1.575 m)   Wt 153 lb 8 oz (69.6 kg)   SpO2 100%   BMI 28.08 kg/m   PHYSICAL EXAMINATION: height is 5\' 2"  (1.575 m) and weight is 153 lb 8 oz (69.6 kg). Her oral temperature is 98 F (36.7 C). Her blood pressure is 116/69 and her pulse is 64. Her respiration is 16 and oxygen saturation is 100%.      Some hyperpigmentation present in the treatment area but overall her skin looks good.  ASSESSMENT: The patient is doing satisfactorily with treatment.  PLAN: We will continue with the patient's radiation treatment as planned.

## 2016-07-12 ENCOUNTER — Ambulatory Visit
Admission: RE | Admit: 2016-07-12 | Discharge: 2016-07-12 | Disposition: A | Discharge status: Home / Self Care | Payer: BC Managed Care – PPO | Source: Ambulatory Visit | Attending: Radiation Oncology | Admitting: Radiation Oncology

## 2016-07-12 DIAGNOSIS — C50411 Malignant neoplasm of upper-outer quadrant of right female breast: Secondary | ICD-10-CM | POA: Diagnosis not present

## 2016-07-13 ENCOUNTER — Ambulatory Visit
Admission: RE | Admit: 2016-07-13 | Discharge: 2016-07-13 | Disposition: A | Discharge status: Home / Self Care | Payer: BC Managed Care – PPO | Source: Ambulatory Visit | Attending: Radiation Oncology | Admitting: Radiation Oncology

## 2016-07-13 DIAGNOSIS — C50411 Malignant neoplasm of upper-outer quadrant of right female breast: Secondary | ICD-10-CM | POA: Diagnosis not present

## 2016-07-14 ENCOUNTER — Ambulatory Visit
Admission: RE | Admit: 2016-07-14 | Discharge: 2016-07-14 | Disposition: A | Discharge status: Home / Self Care | Payer: BC Managed Care – PPO | Source: Ambulatory Visit | Attending: Radiation Oncology | Admitting: Radiation Oncology

## 2016-07-14 DIAGNOSIS — C50411 Malignant neoplasm of upper-outer quadrant of right female breast: Secondary | ICD-10-CM | POA: Diagnosis not present

## 2016-07-15 ENCOUNTER — Ambulatory Visit
Admission: RE | Admit: 2016-07-15 | Discharge: 2016-07-15 | Disposition: A | Discharge status: Home / Self Care | Payer: BC Managed Care – PPO | Source: Ambulatory Visit | Attending: Radiation Oncology | Admitting: Radiation Oncology

## 2016-07-15 DIAGNOSIS — C50411 Malignant neoplasm of upper-outer quadrant of right female breast: Secondary | ICD-10-CM | POA: Diagnosis not present

## 2016-07-16 ENCOUNTER — Inpatient Hospital Stay
Admission: RE | Admit: 2016-07-16 | Discharge: 2016-07-16 | Disposition: A | Discharge status: Home / Self Care | Payer: Self-pay | Source: Ambulatory Visit | Attending: Radiation Oncology | Admitting: Radiation Oncology

## 2016-07-16 ENCOUNTER — Ambulatory Visit
Admission: RE | Admit: 2016-07-16 | Discharge: 2016-07-16 | Disposition: A | Discharge status: Home / Self Care | Payer: BC Managed Care – PPO | Source: Ambulatory Visit | Attending: Radiation Oncology | Admitting: Radiation Oncology

## 2016-07-16 ENCOUNTER — Encounter: Payer: Self-pay | Admitting: Radiation Oncology

## 2016-07-16 VITALS — BP 108/71 | HR 70 | Temp 98.2°F | Resp 18 | Wt 148.8 lb

## 2016-07-16 DIAGNOSIS — C50411 Malignant neoplasm of upper-outer quadrant of right female breast: Secondary | ICD-10-CM

## 2016-07-16 DIAGNOSIS — Z170 Estrogen receptor positive status [ER+]: Principal | ICD-10-CM

## 2016-07-16 NOTE — Progress Notes (Signed)
Weekly rad txs 15/20 completed right breast, hyperpigmentation , skin is dry, uses radialplex bid, occasional twinge in breast, appetite good, energy level, fair,  No c/o pain 2:17 PM BP 108/71 (BP Location: Left Arm, Patient Position: Sitting, Cuff Size: Normal)   Pulse 70   Temp 98.2 F (36.8 C) (Oral)   Resp 18   Wt 148 lb 12.8 oz (67.5 kg)   BMI 27.22 kg/m   Wt Readings from Last 3 Encounters:  07/16/16 148 lb 12.8 oz (67.5 kg)  07/09/16 153 lb 8 oz (69.6 kg)  07/02/16 154 lb (69.9 kg)

## 2016-07-18 NOTE — Progress Notes (Signed)
Department of Radiation Oncology  Phone:  [phone] Fax:        [phone]  Weekly Treatment Note    Name: [name] Date: 07/18/2016 MRN: [id number] DOB: [birth year]   Diagnosis:     ICD-9-CM ICD-10-CM   1. Malignant neoplasm of upper-outer quadrant of right breast in female, estrogen receptor positive ([place]) 174.4 C50.411    V86.0 Z17.0      Current dose: 37.5 Gy  Current fraction: 15   MEDICATIONS: Current Outpatient Prescriptions  Medication Sig Dispense Refill  . acetaminophen (TYLENOL) 650 MG CR tablet Take 1,300 mg by mouth daily as needed for pain.     Marland Kitchen atenolol (TENORMIN) 100 MG tablet Take 1 tablet (100 mg total) by mouth daily. 30 tablet 2  . atorvastatin (LIPITOR) 80 MG tablet take 1 tablet by mouth once daily 30 tablet 3  . cholecalciferol (VITAMIN D) 1000 UNITS tablet Take 4,000 Units by mouth daily.    . clotrimazole-betamethasone (LOTRISONE) cream Apply 1 application topically 2 (two) times daily. (Patient taking differently: Apply 1 application topically daily. ) 30 g 0  . diazepam (VALIUM) 2 MG tablet take 1 tablet by mouth every 12 hours if needed 30 tablet 0  . diltiazem (CARDIZEM CD) 120 MG 24 hr capsule Take 1 capsule (120 mg total) by mouth daily. 90 capsule 3  . DULoxetine (CYMBALTA) 30 MG capsule Take 1 capsule (30 mg total) by mouth daily. 30 capsule 2  . ferrous sulfate 325 (65 FE) MG tablet Take 325 mg by mouth daily with breakfast.    . gabapentin (NEURONTIN) 100 MG capsule Take 3 capsules (300 mg total) by mouth 3 (three) times daily. 180 capsule 0  . loperamide (IMODIUM A-D) 2 MG tablet Take 2 mg by mouth daily as needed for diarrhea or loose stools.    . montelukast (SINGULAIR) 10 MG tablet Take 1 tablet (10 mg total) by mouth daily. 30 tablet 2  . NYAMYC powder apply to affected area twice a day 45 g 0  . ondansetron (ZOFRAN) 8 MG tablet Take 1 tablet (8 mg total) by mouth every 8 (eight) hours as needed for nausea or vomiting.  20 tablet 0  . PAZEO 0.7 % SOLN Place 1 drop into both eyes daily as needed (allergies).     . potassium chloride SA (K-DUR,KLOR-CON) 20 MEQ tablet Take 1 tablet (20 mEq total) by mouth 2 (two) times daily. 60 tablet 6  . prochlorperazine (COMPAZINE) 10 MG tablet Take 1 tablet (10 mg total) by mouth every 6 (six) hours as needed (Nausea or vomiting). 30 tablet 1  . rivaroxaban (XARELTO) 20 MG TABS tablet Take 1 tablet (20 mg total) by mouth daily with supper. 90 tablet 3  . triamterene-hydrochlorothiazide (MAXZIDE-25) 37.5-25 MG tablet Take 1 tablet by mouth daily.    . valACYclovir (VALTREX) 1000 MG tablet Take 1 tablet (1,000 mg total) by mouth 2 (two) times daily. 60 tablet 0  . Gauze Pads & Dressings (TEGADERM CONTACT LAYER) [id number]" PADS 1 application by Does not apply route every other day as needed. (Patient not taking: Reported on 07/16/2016) 1 each 3  . triamterene-hydrochlorothiazide (MAXZIDE-25) 37.5-25 MG tablet take 1 tablet by mouth once daily (Patient not taking: Reported on 07/16/2016) 30 tablet 3   No current facility-administered medications for this encounter.      ALLERGIES: Penicillins   LABORATORY DATA:  Lab Results  Component Value Date   WBC 5.4 04/12/2016   HGB 10.6 (L) 04/12/2016  HCT 34.0 (L) 04/12/2016   MCV 89.2 04/12/2016   PLT 285 04/12/2016   Lab Results  Component Value Date   NA 139 05/16/2016   K 4.0 05/16/2016   CL 104 05/16/2016   CO2 26 05/16/2016   Lab Results  Component Value Date   ALT 16 05/16/2016   AST 24 05/16/2016   ALKPHOS 72 05/16/2016   BILITOT 0.3 05/16/2016     NARRATIVE: Barbette Or Montroy was seen today for weekly treatment management. The chart was checked and the patient's films were reviewed.  Weekly rad txs 15/20 completed right breast, hyperpigmentation , skin is dry, uses radialplex bid, occasional twinge in breast, appetite good, energy level, fair,  No c/o pain 9:43 PM BP 108/71 (BP Location: Left Arm, Patient  Position: Sitting, Cuff Size: Normal)   Pulse 70   Temp 98.2 F (36.8 C) (Oral)   Resp 18   Wt 148 lb 12.8 oz (67.5 kg)   BMI 27.22 kg/m   Wt Readings from Last 3 Encounters:  07/16/16 148 lb 12.8 oz (67.5 kg)  07/09/16 153 lb 8 oz (69.6 kg)  07/02/16 154 lb (69.9 kg)    PHYSICAL EXAMINATION: weight is 148 lb 12.8 oz (67.5 kg). Her oral temperature is 98.2 F (36.8 C). Her blood pressure is 108/71 and her pulse is 70. Her respiration is 18.      The patient's skin shows moderate radiation effect with some hyperpigmentation. No moist desquamation. Overall her skin has held up quite well during treatment.  ASSESSMENT: The patient is doing satisfactorily with treatment.  PLAN: We will continue with the patient's radiation treatment as planned.

## 2016-07-18 NOTE — Progress Notes (Signed)
Complex simulation note  Diagnosis: Right-sided breast cancer  Narrative The patient has initially been planned to receive a course of whole breast radiation to a dose of 42.5 Gy in 17 fractions. The patient will now receive an additional boost to the seroma cavity which has been contoured. This will correspond to a boost of 7.5 Gy at 2.5 Gy per fraction. To accomplish this, an additional 2 customized blocks have been designed for this purpose. A complex isodose plan is requested to ensure that the target area is adequately covered with radiation dose and that the nearby normal structures such as the lung are adequately spared. The patient's final total dose will be 50 Gy.  ------------------------------------------------  Jodelle Gross, MD, PhD

## 2016-07-19 ENCOUNTER — Other Ambulatory Visit: Payer: Self-pay | Admitting: Internal Medicine

## 2016-07-19 ENCOUNTER — Ambulatory Visit
Admission: RE | Admit: 2016-07-19 | Discharge: 2016-07-19 | Disposition: A | Discharge status: Home / Self Care | Payer: BC Managed Care – PPO | Source: Ambulatory Visit | Attending: Radiation Oncology | Admitting: Radiation Oncology

## 2016-07-19 ENCOUNTER — Telehealth: Payer: Self-pay

## 2016-07-19 ENCOUNTER — Encounter: Payer: Self-pay | Admitting: Internal Medicine

## 2016-07-19 DIAGNOSIS — C50411 Malignant neoplasm of upper-outer quadrant of right female breast: Secondary | ICD-10-CM | POA: Diagnosis not present

## 2016-07-19 NOTE — Telephone Encounter (Signed)
Faxed std paperwork to [place] [employer]

## 2016-07-20 ENCOUNTER — Other Ambulatory Visit: Payer: Self-pay | Admitting: Internal Medicine

## 2016-07-20 ENCOUNTER — Ambulatory Visit
Admission: RE | Admit: 2016-07-20 | Discharge: 2016-07-20 | Disposition: A | Discharge status: Home / Self Care | Payer: BC Managed Care – PPO | Source: Ambulatory Visit | Attending: Radiation Oncology | Admitting: Radiation Oncology

## 2016-07-20 DIAGNOSIS — C50411 Malignant neoplasm of upper-outer quadrant of right female breast: Secondary | ICD-10-CM | POA: Diagnosis not present

## 2016-07-20 MED ORDER — DULOXETINE HCL 40 MG PO CPEP: 40.0000 mg | ORAL_CAPSULE | Freq: Every day | ORAL | 2 refills | Status: DC

## 2016-07-20 MED ORDER — ACETAMINOPHEN-CODEINE #3 300-30 MG PO TABS: 1.0000 | ORAL_TABLET | Freq: Three times a day (TID) | ORAL | 0 refills | Status: DC | PRN

## 2016-07-21 ENCOUNTER — Ambulatory Visit
Admission: RE | Admit: 2016-07-21 | Discharge: 2016-07-21 | Disposition: A | Discharge status: Home / Self Care | Payer: BC Managed Care – PPO | Source: Ambulatory Visit | Attending: Radiation Oncology | Admitting: Radiation Oncology

## 2016-07-21 DIAGNOSIS — C50411 Malignant neoplasm of upper-outer quadrant of right female breast: Secondary | ICD-10-CM | POA: Diagnosis not present

## 2016-07-22 ENCOUNTER — Ambulatory Visit
Admission: RE | Admit: 2016-07-22 | Discharge: 2016-07-22 | Disposition: A | Discharge status: Home / Self Care | Payer: BC Managed Care – PPO | Source: Ambulatory Visit | Attending: Radiation Oncology | Admitting: Radiation Oncology

## 2016-07-22 DIAGNOSIS — C50411 Malignant neoplasm of upper-outer quadrant of right female breast: Secondary | ICD-10-CM | POA: Diagnosis not present

## 2016-07-23 ENCOUNTER — Ambulatory Visit
Admission: RE | Admit: 2016-07-23 | Discharge: 2016-07-23 | Disposition: A | Discharge status: Home / Self Care | Payer: BC Managed Care – PPO | Source: Ambulatory Visit | Attending: Radiation Oncology | Admitting: Radiation Oncology

## 2016-07-23 ENCOUNTER — Encounter: Payer: Self-pay | Admitting: Radiation Oncology

## 2016-07-23 ENCOUNTER — Other Ambulatory Visit: Payer: Self-pay | Admitting: *Deleted

## 2016-07-23 ENCOUNTER — Ambulatory Visit: Payer: BC Managed Care – PPO

## 2016-07-23 VITALS — BP 114/76 | HR 67 | Temp 97.7°F | Resp 16 | Wt 151.4 lb

## 2016-07-23 DIAGNOSIS — C50411 Malignant neoplasm of upper-outer quadrant of right female breast: Secondary | ICD-10-CM

## 2016-07-23 DIAGNOSIS — Z170 Estrogen receptor positive status [ER+]: Principal | ICD-10-CM

## 2016-07-23 NOTE — Progress Notes (Signed)
Department of Radiation Oncology  Phone:  [phone] Fax:        [phone]  Weekly Treatment Note    Name: [name] Date: 07/23/2016 MRN: [id number] DOB: 1955/12/16   Diagnosis:     ICD-9-CM ICD-10-CM   1. Malignant neoplasm of upper-outer quadrant of right breast in female, estrogen receptor positive ([place]) 174.4 C50.411    V86.0 Z17.0      Current dose: 50 Gy  Current fraction: 20   MEDICATIONS: Current Outpatient Prescriptions  Medication Sig Dispense Refill  . acetaminophen (TYLENOL) 650 MG CR tablet Take 1,300 mg by mouth daily as needed for pain.     Marland Kitchen acetaminophen-codeine (TYLENOL #3) 300-30 MG tablet Take 1 tablet by mouth every 8 (eight) hours as needed for severe pain. 60 tablet 0  . atenolol (TENORMIN) 100 MG tablet Take 1 tablet (100 mg total) by mouth daily. 30 tablet 2  . atorvastatin (LIPITOR) 80 MG tablet take 1 tablet by mouth once daily 30 tablet 3  . cholecalciferol (VITAMIN D) 1000 UNITS tablet Take 4,000 Units by mouth daily.    . clotrimazole-betamethasone (LOTRISONE) cream Apply 1 application topically 2 (two) times daily. (Patient taking differently: Apply 1 application topically daily. ) 30 g 0  . diazepam (VALIUM) 2 MG tablet take 1 tablet by mouth every 12 hours if needed 30 tablet 0  . diltiazem (CARDIZEM CD) 120 MG 24 hr capsule Take 1 capsule (120 mg total) by mouth daily. 90 capsule 3  . DULoxetine HCl 40 MG CPEP Take 40 mg by mouth daily. 30 capsule 2  . ferrous sulfate 325 (65 FE) MG tablet Take 325 mg by mouth daily with breakfast.    . gabapentin (NEURONTIN) 100 MG capsule Take 3 capsules (300 mg total) by mouth 3 (three) times daily. 180 capsule 0  . loperamide (IMODIUM A-D) 2 MG tablet Take 2 mg by mouth daily as needed for diarrhea or loose stools.    . montelukast (SINGULAIR) 10 MG tablet Take 1 tablet (10 mg total) by mouth daily. 30 tablet 2  . NYAMYC powder apply to affected area twice a day 45 g 0  . ondansetron  (ZOFRAN) 8 MG tablet Take 1 tablet (8 mg total) by mouth every 8 (eight) hours as needed for nausea or vomiting. 20 tablet 0  . PAZEO 0.7 % SOLN Place 1 drop into both eyes daily as needed (allergies).     . potassium chloride SA (K-DUR,KLOR-CON) 20 MEQ tablet Take 1 tablet (20 mEq total) by mouth 2 (two) times daily. 60 tablet 6  . prochlorperazine (COMPAZINE) 10 MG tablet Take 1 tablet (10 mg total) by mouth every 6 (six) hours as needed (Nausea or vomiting). 30 tablet 1  . rivaroxaban (XARELTO) 20 MG TABS tablet Take 1 tablet (20 mg total) by mouth daily with supper. 90 tablet 3  . triamterene-hydrochlorothiazide (MAXZIDE-25) 37.5-25 MG tablet Take 1 tablet by mouth daily.    . valACYclovir (VALTREX) 1000 MG tablet Take 1 tablet (1,000 mg total) by mouth 2 (two) times daily. 60 tablet 0  . Gauze Pads & Dressings (TEGADERM CONTACT LAYER) [id number]" PADS 1 application by Does not apply route every other day as needed. (Patient not taking: Reported on 07/23/2016) 1 each 3  . triamterene-hydrochlorothiazide (MAXZIDE-25) 37.5-25 MG tablet take 1 tablet by mouth once daily (Patient not taking: Reported on 07/23/2016) 30 tablet 3   No current facility-administered medications for this encounter.      ALLERGIES: Penicillins  LABORATORY DATA:  Lab Results  Component Value Date   WBC 5.4 04/12/2016   HGB 10.6 (L) 04/12/2016   HCT 34.0 (L) 04/12/2016   MCV 89.2 04/12/2016   PLT 285 04/12/2016   Lab Results  Component Value Date   NA 139 05/16/2016   K 4.0 05/16/2016   CL 104 05/16/2016   CO2 26 05/16/2016   Lab Results  Component Value Date   ALT 16 05/16/2016   AST 24 05/16/2016   ALKPHOS 72 05/16/2016   BILITOT 0.3 05/16/2016     NARRATIVE: [name] was seen today for weekly treatment management. The chart was checked and the patient's films were reviewed.  Weekly rad txs right breast  20/20 completed  hyperpigmentation , skin intact but dry, using radaiplex bid, no c/o  pain,  Appetite good, drinking plenty water, energy level poor, 1 month f/u appt card given  BP 114/76 (BP Location: Left Arm, Patient Position: Sitting, Cuff Size: Normal)   Pulse 67   Temp 97.7 F (36.5 C) (Oral)   Resp 16   Wt 151 lb 6.4 oz (68.7 kg)   BMI 27.69 kg/m   Wt Readings from Last 3 Encounters:  07/23/16 151 lb 6.4 oz (68.7 kg)  07/16/16 148 lb 12.8 oz (67.5 kg)  07/09/16 153 lb 8 oz (69.6 kg)    PHYSICAL EXAMINATION: weight is 151 lb 6.4 oz (68.7 kg). Her oral temperature is 97.7 F (36.5 C). Her blood pressure is 114/76 and her pulse is 67. Her respiration is 16.      Hyperpigmentation present with some modest desquamation. Overall her skin has held up well during treatment.  ASSESSMENT: The patient is doing satisfactorily with treatment.  She finished her final fraction today.  PLAN:  The patient will follow-up in one month after completing her course of radiation treatment today. I'm pleased with how her skin has done. She understands that she can begin/resume her regular medicine.

## 2016-07-23 NOTE — Progress Notes (Addendum)
Weekly rad txs right breast  20/20 completed  hyperpigmentation , skin intact but dry, using radaiplex bid, no c/o pain,  Appetite good, drinking plenty water, energy level poor, 1 month f/u appt card given  BP 114/76 (BP Location: Left Arm, Patient Position: Sitting, Cuff Size: Normal)   Pulse 67   Temp 97.7 F (36.5 C) (Oral)   Resp 16   Wt 151 lb 6.4 oz (68.7 kg)   BMI 27.69 kg/m   Wt Readings from Last 3 Encounters:  07/23/16 151 lb 6.4 oz (68.7 kg)  07/16/16 148 lb 12.8 oz (67.5 kg)  07/09/16 153 lb 8 oz (69.6 kg)

## 2016-07-26 ENCOUNTER — Ambulatory Visit: Payer: BC Managed Care – PPO

## 2016-07-28 ENCOUNTER — Telehealth: Payer: Self-pay | Admitting: Adult Health

## 2016-07-28 ENCOUNTER — Encounter: Payer: Self-pay | Admitting: Radiation Oncology

## 2016-07-28 NOTE — Telephone Encounter (Signed)
appt made per LOS; letter mailed 10/11

## 2016-07-28 NOTE — Progress Notes (Signed)
°  Radiation Oncology         [phone] ________________________________  Name: [name] MRN: [id number]  Date: 07/28/2016  DOB: January 10, 1956  End of Treatment Note  Diagnosis:   Malignant neoplasm of upper-outer quadrant of right breast, estrogen receptor positive     Indication for treatment:  Curative      Radiation treatment dates:   06/28/16-07/23/16  Site/dose:  1) Right Breast/ 42.5 Gy in 17 fx   2) Boost/ 7.5 Gy in 3 fx Beams/energy:   1) 3D / 6X        2)Isodose Plan / 6X  Narrative: The patient tolerated radiation treatment relatively well.   She had mild symptoms including hyperpigmentation and dry skin.  Plan: The patient has completed radiation treatment. The patient will return to radiation oncology clinic for routine followup in one month. I advised them to call or return sooner if they have any questions or concerns related to their recovery or treatment.  ------------------------------------------------  Jodelle Gross, MD, PhD  This document serves as a record of services personally performed by Kyung Rudd, MD. It was created on his behalf by Bethann Humble, a trained medical scribe. The creation of this record is based on the scribe's personal observations and the provider's statements to them. This document has been checked and approved by the attending provider.

## 2016-07-29 ENCOUNTER — Telehealth: Payer: Self-pay | Admitting: *Deleted

## 2016-07-29 ENCOUNTER — Ambulatory Visit (INDEPENDENT_AMBULATORY_CARE_PROVIDER_SITE_OTHER): Payer: BC Managed Care – PPO | Admitting: Internal Medicine

## 2016-07-29 VITALS — BP 124/72 | HR 68 | Temp 97.8°F | Resp 16 | Ht 62.0 in | Wt 150.0 lb

## 2016-07-29 DIAGNOSIS — L989 Disorder of the skin and subcutaneous tissue, unspecified: Secondary | ICD-10-CM

## 2016-07-29 MED ORDER — DEXAMETHASONE SODIUM PHOSPHATE 10 MG/ML IJ SOLN
10.0000 mg | Freq: Once | INTRAMUSCULAR | Status: AC
  Administered 2016-07-29: 10 mg via INTRAMUSCULAR

## 2016-07-29 NOTE — Telephone Encounter (Signed)
Returned call to patient, she is c/o painful bumps on her back where her shingles were before radiation started on her right breast that was treated, suggested she call her Primary MD , it sounds like  Shingles again, she thanked this Rn and will call her primary MD 10:49 AM

## 2016-07-29 NOTE — Progress Notes (Signed)
   Subjective:    Patient ID: [name], female    DOB: 24-Dec-1955, 60 y.o.   MRN: [id number]  HPI  Patient reports that she is having a some issues with some blistering on the left side of her back.  She has noticed it for the last couple days.  She reports that she has not seen her dermatologist recently.  She reports that she has not seen him in a while.  She reports that the bump is mildly painful.  She has not been putting anything on these areas.     Review of Systems  Constitutional: Negative for chills, fatigue and fever.  Respiratory: Negative for chest tightness and shortness of breath.   Skin: Positive for rash and wound.       Objective:   Physical Exam  Constitutional: She appears well-developed and well-nourished. No distress.  HENT:  Head: Normocephalic.  Mouth/Throat: Oropharynx is clear and moist. No oropharyngeal exudate.  Eyes: Conjunctivae are normal. No scleral icterus.  Neck: Normal range of motion. Neck supple. No JVD present. No thyromegaly present.  Cardiovascular: Normal rate, normal heart sounds and intact distal pulses.  An irregularly irregular rhythm present. Exam reveals no gallop and no friction rub.   No murmur heard. Pulmonary/Chest: Effort normal and breath sounds normal. No respiratory distress. She has no wheezes. She has no rales. She exhibits no tenderness.  Lymphadenopathy:    She has no cervical adenopathy.  Skin: Rash noted. She is not diaphoretic.     Generalized flesh colored papules across the back with xerosis.  There is cracking to the skin of the left armpit, and in the periorbital region.    Nursing note and vitals reviewed.   Vitals:   07/29/16 1604  BP: 124/72  Pulse: 68  Resp: 16  Temp: 97.8 F (36.6 C)          Assessment & Plan:    1. Skin lesion -skin lesions noted on top of shingles wound which was active approximately 6 months ago.  Skin has recently healed, but continues to be painful.   -there is  no evidence that the patient was ever tested for shingles.  Given unknown origin of rash complicated by history of very severe eczema we will take a biopsy of the skin and send to pathology tomorrow.  There was not time to do it today as patient does have higher bleeding risk with xarelto.   -decadron given her IM

## 2016-07-29 NOTE — Patient Instructions (Signed)
Erythema Nodosum  Erythema nodosum is a skin condition in which patches of fat under the skin of the lower legs become inflamed. This causes painful bumps (nodules) to form.  CAUSES  Common causes of this condition include:   Infections.   Certain medicines, especially birth control pills, penicillin, and sulfa medicines.  Other causes include:   Pregnancy.   Certain inflammatory conditions, including Lupus, Crohn's disease, and thyroid conditions.  In some cases, the cause may not be known.  RISK FACTORS  This condition is more likely to develop in young adult women.  SYMPTOMS  The main symptom of this condition is large nodules that look like raised bruises and are tender to the touch. These nodules usually appear on the shins, but they may also appear on the arms or the trunk. They gradually change in color from pink to brown, and they leave a dark mark that clears up in several months.  Other symptoms include:   Fever.   Fatigue.   Joint pain.   Itchiness.  DIAGNOSIS  This condition is diagnosed based on symptoms. To find the underlying condition that caused the erythema nodosum, your health care provider may also do a physical exam, X-rays, and blood tests.  TREATMENT  Treatment for this condition depends on the cause. The nodules usually go away with treatment of the underlying condition. Any pain or discomfort may be treated with:   Anti-inflammatory medicines.   Bed rest.   Raising (elevating) the affected area.   Cool compresses.  In some cases, steroids and potassium iodide tablets may be given.  HOME CARE INSTRUCTIONS   Take medicines only as directed by your health care provider.   Stay in bed for as long as directed by your health care provider.   Until your symptoms go away, limit any exercising that makes you breathe harder and faster (vigorous).   Elevate the affected leg as directed by your health care provider.   Apply cool compresses to the affected area as directed by your health  care provider.  SEEK MEDICAL CARE IF:   Your symptoms are not improving.   You have a fever that does not go away.  SEEK IMMEDIATE MEDICAL CARE IF:   Your condition gets worse.   Your pain gets worse.   You have a sore throat.   You vomit repeatedly.     This information is not intended to replace advice given to you by your health care provider. Make sure you discuss any questions you have with your health care provider.     Document Released: 11/11/2004 Document Revised: 02/18/2015 Document Reviewed: 09/11/2014  Elsevier Interactive Patient Education 2016 [employer]

## 2016-07-30 ENCOUNTER — Encounter: Payer: Self-pay | Admitting: Internal Medicine

## 2016-07-30 ENCOUNTER — Ambulatory Visit (INDEPENDENT_AMBULATORY_CARE_PROVIDER_SITE_OTHER): Payer: BC Managed Care – PPO | Admitting: Internal Medicine

## 2016-07-30 VITALS — BP 128/80 | HR 82 | Temp 98.2°F | Resp 16 | Ht 62.0 in

## 2016-07-30 DIAGNOSIS — L989 Disorder of the skin and subcutaneous tissue, unspecified: Secondary | ICD-10-CM

## 2016-08-02 NOTE — Progress Notes (Signed)
Patient presents to the office today for excisional biopsy of skin lesions which started approximately 3 days ago over the scar tissue which was presumed to be from shingles.  She is aware of possible risks of infection including infection, scarring, and bleeding risks and would like to proceed with procedure.    After verbal informed consent the patient was prepped and draped in a semi sterile manner.  Skin was cleaned with isopropyl alcohol.  3 cc of marcaine with Epi was injected in a subdermal wheal.  An 11 blade scalpal was used to make an elliptical incision of the left scapular area and the skin lesion was excised completely.  Excision was mildly complicated due to thick fibrous scar tissue.  Sample was sent to pathology for further testing in formalin.  Wound was cleaned.  No cautery was required.  The wound was closed with 3-0 sutures.  A single horizontal matress suture was placed in the middle and 2 interrupted sutures were placed on either end resulting in loose approximation.  A lasso was placed under the horizontal matress suture for easy removal.  Patient was cleaned and triple antibiotic was placed on the wound.  Tegaderm was placed on the wound.  Patient has an appointment next week with Dr. Melford Aase who will determine whether stitches will be appropriate to remove at that time.  Patient tolerated procedure well.

## 2016-08-04 ENCOUNTER — Encounter: Payer: Self-pay | Admitting: Internal Medicine

## 2016-08-04 ENCOUNTER — Other Ambulatory Visit: Payer: Self-pay | Admitting: Internal Medicine

## 2016-08-04 ENCOUNTER — Ambulatory Visit (INDEPENDENT_AMBULATORY_CARE_PROVIDER_SITE_OTHER): Payer: BC Managed Care – PPO | Admitting: Internal Medicine

## 2016-08-04 VITALS — BP 114/62 | HR 72 | Temp 97.9°F | Resp 16 | Ht 62.0 in | Wt 146.8 lb

## 2016-08-04 DIAGNOSIS — R7303 Prediabetes: Secondary | ICD-10-CM

## 2016-08-04 DIAGNOSIS — I10 Essential (primary) hypertension: Secondary | ICD-10-CM

## 2016-08-04 DIAGNOSIS — E782 Mixed hyperlipidemia: Secondary | ICD-10-CM

## 2016-08-04 DIAGNOSIS — Z79899 Other long term (current) drug therapy: Secondary | ICD-10-CM | POA: Diagnosis not present

## 2016-08-04 DIAGNOSIS — I4891 Unspecified atrial fibrillation: Secondary | ICD-10-CM | POA: Diagnosis not present

## 2016-08-04 DIAGNOSIS — E559 Vitamin D deficiency, unspecified: Secondary | ICD-10-CM

## 2016-08-04 LAB — TSH: TSH: 1.96 mIU/L

## 2016-08-04 LAB — CBC WITH DIFFERENTIAL/PLATELET
BASOS PCT: 0 %
Basophils Absolute: 0 cells/uL (ref 0–200)
EOS PCT: 8 %
Eosinophils Absolute: 368 cells/uL (ref 15–500)
HCT: 40 % (ref 35.0–45.0)
HEMOGLOBIN: 13.1 g/dL (ref 11.7–15.5)
LYMPHS ABS: 736 {cells}/uL — AB (ref 850–3900)
Lymphocytes Relative: 16 %
MCH: 30.1 pg (ref 27.0–33.0)
MCHC: 32.8 g/dL (ref 32.0–36.0)
MCV: 92 fL (ref 80.0–100.0)
MONOS PCT: 14 %
MPV: 9.3 fL (ref 7.5–12.5)
Monocytes Absolute: 644 cells/uL (ref 200–950)
NEUTROS ABS: 2852 {cells}/uL (ref 1500–7800)
Neutrophils Relative %: 62 %
PLATELETS: 285 10*3/uL (ref 140–400)
RBC: 4.35 MIL/uL (ref 3.80–5.10)
RDW: 17.2 % — ABNORMAL HIGH (ref 11.0–15.0)
WBC: 4.6 10*3/uL (ref 3.8–10.8)

## 2016-08-04 LAB — BASIC METABOLIC PANEL WITH GFR
BUN: 11 mg/dL (ref 7–25)
CHLORIDE: 97 mmol/L — AB (ref 98–110)
CO2: 25 mmol/L (ref 20–31)
Calcium: 10 mg/dL (ref 8.6–10.4)
Creat: 0.74 mg/dL (ref 0.50–0.99)
GFR, EST NON AFRICAN AMERICAN: 88 mL/min (ref >=60)
GFR, Est African American: >89 mL/min (ref >=60)
Glucose, Bld: 90 mg/dL (ref 65–99)
Potassium: 4.3 mmol/L (ref 3.5–5.3)
SODIUM: 137 mmol/L (ref 135–146)

## 2016-08-04 LAB — HEPATIC FUNCTION PANEL
ALT: 13 U/L (ref 6–29)
AST: 21 U/L (ref 10–35)
Albumin: 4.1 g/dL (ref 3.6–5.1)
Alkaline Phosphatase: 112 U/L (ref 33–130)
BILIRUBIN DIRECT: 0.1 mg/dL (ref <=0.2)
BILIRUBIN INDIRECT: 0.5 mg/dL (ref 0.2–1.2)
BILIRUBIN TOTAL: 0.6 mg/dL (ref 0.2–1.2)
Total Protein: 7 g/dL (ref 6.1–8.1)

## 2016-08-04 LAB — LIPID PANEL
CHOL/HDL RATIO: 4.2 ratio (ref <=5.0)
CHOLESTEROL: 244 mg/dL — AB (ref 125–200)
HDL: 58 mg/dL (ref >=46)
LDL Cholesterol: 154 mg/dL — ABNORMAL HIGH (ref <130)
Triglycerides: 160 mg/dL — ABNORMAL HIGH (ref <150)
VLDL: 32 mg/dL — AB (ref <30)

## 2016-08-04 LAB — MAGNESIUM: Magnesium: 1.7 mg/dL (ref 1.5–2.5)

## 2016-08-04 MED ORDER — MONTELUKAST SODIUM 10 MG PO TABS: 10.0000 mg | ORAL_TABLET | Freq: Every day | ORAL | 2 refills | Status: DC

## 2016-08-04 MED ORDER — ACETAMINOPHEN-CODEINE #3 300-30 MG PO TABS: 1.0000 | ORAL_TABLET | Freq: Three times a day (TID) | ORAL | 0 refills | Status: DC | PRN

## 2016-08-04 NOTE — Patient Instructions (Signed)

## 2016-08-04 NOTE — Progress Notes (Signed)
[facility] Unk Pinto, M.D.        Uvaldo Bristle. Silverio Lay, P.A.-C       Starlyn Skeans, P.A.-C  [facility]                [address] [place] [address] Telephone [phone] Telefax [phone] ______________________________________________________________________     This very nice 60 y.o. WBF presents for  follow up with Hypertension, Hyperlipidemia, Pre-Diabetes, hx/o pAfib, Vitamin D Deficiency who was dx'd w/ R breast Ca in Feb 2017, had R Lumpectomy in March and in April started ChemoTx for ER+/HER-2 Neg tumor and was followed with RadioTx 9/5-10/03/2016. Recently (08/02/2016)  patient had bx of a suspicious skin nodule of the Left back -area of prior shingles and path report still pending.     Patient is treated for HTN & BP has been controlled at home. Today's BP: 114/62. Patient has hx/o pAfib in 2013 and in 2016 and is on Xarelto. Patient has had no complaints of any cardiac type chest pain, palpitations, dyspnea/orthopnea/PND, dizziness, claudication, or dependent edema.     Hyperlipidemia is controlled with diet & meds. Patient denies myalgias or other med SE's. Last Lipids were not at goal: Lab Results  Component Value Date   CHOL 227 (H) 01/27/2016   HDL 48 01/27/2016   LDLCALC 139 (H) 01/27/2016   TRIG 200 (H) 01/27/2016   CHOLHDL 4.7 01/27/2016      Also, the patient has history of PreDiabetes and has had no symptoms of reactive hypoglycemia, diabetic polys, paresthesias or visual blurring.  Last A1c was not at goal and patient has lost about ~30# over the last 6 months during the course of her cancer therapies and it's anticipated that her A1c will be significantly improved.  Lab Results  Component Value Date   HGBA1C 6.1 (H) 01/27/2016      Further, the patient also has history of Vitamin D Deficiency and supplements vitamin D without any suspected side-effects. Last  vitamin D was not at goal of 70-100: Lab Results  Component Value Date   VD25OH 42 01/27/2016   Current Outpatient Prescriptions on File Prior to Visit  Medication Sig  . TYLENOL #3 Take 1 tab every 8  hours as needed for severe pain.  Marland Kitchen atenolol  100 MG Take 1 tab daily.  Marland Kitchen atorvastatin  80 MG  take 1 tab once daily  . VITAMIN D 1000 UNITS Take 4,000 Units  daily.  Marland Kitchen LOTRISONE cream Apply 1 application topically daily. )  . diazepam 2 MG tablet take 1 tablet by mouth every 12 hours if needed  . Diltiazem-CD 120 MG  Take 1 capsule (120 mg total) by mouth daily.  . ferrous sulfate 325  MG Take 325 mg by mouth daily with breakfast.  . IMODIUM2 MG Take 2 mg by mouth daily as needed for diarrhea  . montelukast  10 MG  Take 1 tablet (10 mg total) by mouth daily.  . ondansetron  8 MG Take 1 tab every 8 hours as needed for nausea  . PAZEO 0.7 % SOLN Place 1 drop into both eyes daily allergies   . K-DUR 20 MEQ  Take 1 tablet (20 mEq total) by mouth 2 (two) times daily.  . COMPAZINE 10 MG  Take 1 tab every 6 hours as needed   . XARELTO 20 MG  Take  1 tabl daily with supper.  Marland Kitchen MAXZIDE-25  Take 1 tablet by mouth daily.  . valACYclovir 1000 MG  Take 1 tab 2  times daily.   Allergies  Allergen Reactions  . Penicillins Other (See Comments)   PMHx:   Past Medical History:  Diagnosis Date  . Anemia   . Atrial fibrillation ([place]) 2016  . Breast cancer ([place])   . Breast cancer of upper-outer quadrant of right female breast ([place]) 12/12/2015  . Complication of anesthesia 10-06-15    slow to awaken after   . Dysrhythmia    A FIB - followed by Dr. Meda Coffee  . Eczema   . Family history of breast cancer   . Family history of prostate cancer   . GERD (gastroesophageal reflux disease)   . Hypercholesteremia   . Hypertension   . Kidney stones   . MRSA (methicillin resistant staph aureus) culture positive 2009    none since per patient  . Paroxysmal atrial fibrillation ([place]) 10/2011, 06/2015  . PONV  (postoperative nausea and vomiting) 10-06-15   admitted back to hospital for dehydration  . Vitamin D deficiency    Immunization History  Administered Date(s) Administered  . Influenza Split 07/13/2013  . PPD Test 04/25/2014  . Tdap 04/25/2013   Past Surgical History:  Procedure Laterality Date  . BREAST SURGERY Left    biopsy-neg  . CYSTOSCOPY WITH HOLMIUM LASER LITHOTRIPSY Right 10/06/2015   Procedure: CYSTOSCOPY WITH HOLMIUM LASER LITHOTRIPSY;  Surgeon: Kathie Rhodes, MD;  Location: [facility];  Service: Urology;  Laterality: Right;  . KIDNEY STONE SURGERY  2013  . NEPHROLITHOTOMY Right 10/06/2015   Procedure: NEPHROLITHOTOMY PERCUTANEOUS;  Surgeon: Kathie Rhodes, MD;  Location: [facility];  Service: Urology;  Laterality: Right;  . PORT-A-CATH REMOVAL Left 05/13/2016   Procedure: REMOVAL PORT-A-CATH;  Surgeon: Stark Klein, MD;  Location: [place];  Service: General;  Laterality: Left;  . PORTACATH PLACEMENT Left 12/23/2015   Procedure: INSERTION PORT-A-CATH WITH [place];  Surgeon: Stark Klein, MD;  Location: [facility];  Service: General;  Laterality: Left;  . RADIOACTIVE SEED GUIDED PARTIAL MASTECTOMY/AXILLARY SENTINEL NODE BIOPSY/AXILLARY NODE DISSECTION Right 05/13/2016   Procedure: RIGHT BREAST SEED LOCALIZED LUMPECTOMY WITH AXILLARY LYMPH NODE DISSECTION;  Surgeon: Stark Klein, MD;  Location: [place];  Service: General;  Laterality: Right;   FHx:    Reviewed / unchanged  SHx:    Reviewed / unchanged  Systems Review:  Constitutional: Denies fever, chills, wt changes, headaches, insomnia, fatigue, night sweats, change in appetite. Eyes: Denies redness, blurred vision, diplopia, discharge, itchy, watery eyes.  ENT: Denies discharge, congestion, post nasal drip, epistaxis, sore throat, earache, hearing loss, dental pain, tinnitus, vertigo, sinus pain, snoring.  CV: Denies chest pain, palpitations, irregular heartbeat, syncope, dyspnea, diaphoresis, orthopnea, PND,  claudication or edema. Respiratory: denies cough, dyspnea, DOE, pleurisy, hoarseness, laryngitis, wheezing.  Gastrointestinal: Denies dysphagia, odynophagia, heartburn, reflux, water brash, abdominal pain or cramps, nausea, vomiting, bloating, diarrhea, constipation, hematemesis, melena, hematochezia  or hemorrhoids. Genitourinary: Denies dysuria, frequency, urgency, nocturia, hesitancy, discharge, hematuria or flank pain. Musculoskeletal: Denies arthralgias, myalgias, stiffness, jt. swelling, pain, limping or strain/sprain.  Skin: Denies pruritus, rash, hives, warts, acne, eczema or change in skin lesion(s). Neuro: No weakness, tremor, incoordination, spasms, paresthesia or pain. Psychiatric: Denies confusion, memory loss or sensory loss. Endo: Denies change in weight, skin or hair change.  Heme/Lymph: No excessive bleeding, bruising or enlarged lymph nodes.  Physical Exam BP 114/62   Pulse 72   Temp  97.9 F (36.6 C)   Resp 16   Ht 5' 2" (1.575 m)   Wt 146 lb 12.8 oz (66.6 kg)   BMI 26.85 kg/m   Appears well nourished and in no distress.  Eyes: PERRLA, EOMs, conjunctiva no swelling or erythema. Sinuses: No frontal/maxillary tenderness ENT/Mouth: EAC's clear, TM's nl w/o erythema, bulging. Nares clear w/o erythema, swelling, exudates. Oropharynx clear without erythema or exudates. Oral hygiene is good. Tongue normal, non obstructing. Hearing intact.  Neck: Supple. Thyroid nl. Car 2+/2+ without bruits, nodes or JVD. Chest: Respirations nl with BS clear & equal w/o rales, rhonchi, wheezing or stridor.  Cor: Heart sounds normal w/ regular rate and rhythm without sig. murmurs, gallops, clicks, or rubs. Peripheral pulses normal and equal  without edema.  Abdomen: Soft & bowel sounds normal. Non-tender w/o guarding, rebound, hernias, masses, or organomegaly.  Lymphatics: Unremarkable.  Musculoskeletal: Full ROM all peripheral extremities, joint stability, 5/5 strength, and normal gait.   Skin: Warm, dry without exposed rashes, lesions or ecchymosis apparent.  Neuro: Cranial nerves intact, reflexes equal bilaterally. Sensory-motor testing grossly intact. Tendon reflexes grossly intact.  Pysch: Alert & oriented x 3.  Insight and judgement nl & appropriate. No ideations.  Assessment and Plan:  1. Essential hypertension  - Continue medication, monitor blood pressure at home. Continue DASH diet.  - Reminder to go to the ER if any CP, SOB, nausea, dizziness, severe HA, changes vision/speech, left arm numbness and tingling and jaw pain. - TSH  2. Mixed hyperlipidemia  - Continue diet/meds, exercise,& lifestyle modifications. Continue monitor periodic cholesterol/liver & renal functions  - Lipid panel - TSH  3. Prediabetes  - Continue diet, exercise, lifestyle modifications.  - Monitor appropriate labs. - Hemoglobin A1c - Insulin, random  4. Vitamin D deficiency  - Continue supplementation. - VITAMIN D 25 Hydroxy   5. Atrial fibrillation with RVR ([place])   6. Medication management  - CBC with Differential/Platelet - BASIC METABOLIC PANEL WITH GFR - Hepatic function panel - Magnesium      Recommended regular exercise, BP monitoring, weight control, and discussed med and SE's. Recommended labs to assess and monitor clinical status. Further disposition pending results of labs. Over 30 minutes of exam, counseling, chart review was performed

## 2016-08-05 LAB — VITAMIN D 25 HYDROXY (VIT D DEFICIENCY, FRACTURES): VIT D 25 HYDROXY: 69 ng/mL (ref 30–100)

## 2016-08-05 LAB — INSULIN, RANDOM: Insulin: 11 u[IU]/mL (ref 2.0–19.6)

## 2016-08-05 LAB — HEMOGLOBIN A1C
HEMOGLOBIN A1C: 5.2 % (ref <5.7)
Mean Plasma Glucose: 103 mg/dL

## 2016-08-06 ENCOUNTER — Telehealth: Payer: Self-pay | Admitting: Internal Medicine

## 2016-08-06 NOTE — Telephone Encounter (Signed)
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  Patient is aware that she will be getting a call from Dr. Lindi Adie about coming in to the office.  She is very upset about these results.  She will let us know if she does not hear from Dr. Geralyn Flash office on Monday or Tuesday.

## 2016-08-09 ENCOUNTER — Telehealth: Payer: Self-pay

## 2016-08-09 NOTE — Telephone Encounter (Signed)
[name] called to Radiation Oncology today. She is concerned because she learned last Friday that she had new metastatic lesions. She was told that Dr. Geralyn Flash office would call with an appointment for evaluation. She is very worried and anxious about this new information, and wanted to make sure she would be called as soon as possible. I told her I would call Dr. Geralyn Flash nurse and see if they would be able to call her and set her up with an appointment. I hung up with Ms. Alfonzo Beers and called Dr. Geralyn Flash nurse, Coralyn Mark. I explained the situation to [place], and she voiced that she would be able to set this up, and call Ms. Schlageter with an appointment.

## 2016-08-10 ENCOUNTER — Ambulatory Visit (HOSPITAL_BASED_OUTPATIENT_CLINIC_OR_DEPARTMENT_OTHER): Payer: BC Managed Care – PPO | Admitting: Hematology and Oncology

## 2016-08-10 ENCOUNTER — Encounter: Payer: Self-pay | Admitting: Hematology and Oncology

## 2016-08-10 ENCOUNTER — Encounter: Payer: Self-pay | Admitting: Internal Medicine

## 2016-08-10 DIAGNOSIS — C773 Secondary and unspecified malignant neoplasm of axilla and upper limb lymph nodes: Secondary | ICD-10-CM | POA: Diagnosis not present

## 2016-08-10 DIAGNOSIS — C50411 Malignant neoplasm of upper-outer quadrant of right female breast: Secondary | ICD-10-CM

## 2016-08-10 DIAGNOSIS — Z171 Estrogen receptor negative status [ER-]: Principal | ICD-10-CM

## 2016-08-10 DIAGNOSIS — C792 Secondary malignant neoplasm of skin: Secondary | ICD-10-CM | POA: Diagnosis not present

## 2016-08-10 DIAGNOSIS — IMO0001 Reserved for inherently not codable concepts without codable children: Secondary | ICD-10-CM

## 2016-08-10 NOTE — Assessment & Plan Note (Signed)
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

## 2016-08-10 NOTE — Progress Notes (Signed)
Patient Care Team: Unk Pinto, MD as PCP - General (Internal Medicine) Warden Fillers, MD as Consulting Physician (Ophthalmology) Kathie Rhodes, MD as Consulting Physician (Urology) Danella Sensing, MD as Consulting Physician (Dermatology) Alfonso Patten, RN (Inactive) as Registered Nurse Dorothy Spark, MD as Consulting Physician (Cardiology) Sylvan Cheese, NP as Nurse Practitioner (Hematology and Oncology)  DIAGNOSIS:  Encounter Diagnoses  Name Primary?  . Malignant neoplasm of upper-outer quadrant of right breast in female, estrogen receptor negative ([place])   . 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 Differential diagnosis complete replacement of lymph node versus direct extension      01/19/2016 - 01/22/2016 Hospital Admission    Hospitalization for abdominal pain, diarrhea, nausea and vomiting      02/21/2016 - 02/24/2016 Hospital Admission    Hospitalization for herpes zoster and sepsis due to cellulitis      03/05/2016 Breast MRI    Decrease in the right breast  malignancy from 3.2 cm to 2.9 cm, decreasing the lymph node from 2.1 cm to 0.9 cm      03/22/2016 - 03/26/2016 Hospital Admission    Hospitalization for suspicion for wound infection from the shingles      05/13/2016 Surgery    Right lumpectomy: IDC, 3 cm, margins negative, 1/23 lymph nodes with extracapsular extension, lymphovascular invasion present, ER 0%, PR 0%, HER-2/neu negative ratio 1.74, Ki-67 60%,ypT2ypN1 (stage IIB)      06/28/2016 - 07/23/2016 Radiation Therapy    Adjuvant radiation therapy      07/30/2016 Relapse/Recurrence    Skin left scapular region: Metastatic carcinoma consistent with breast primary, CK 7 positive, p63 negative       CHIEF COMPLIANT: follow-up after adjuvant radiation, newly diagnosed cutaneous metastases  INTERVAL HISTORY: [name] is a 60-year-old with above-mentioned history of triple negative right breast cancer treated with 4 cycles of neoadjuvant chemotherapy with dose dense Adriamycin Cytoxan. She could not tolerate further chemotherapy and chemotherapy was discontinued. She underwent right lumpectomy which revealed 3 or invasive ductal carcinoma that was involving 1 lymph node. She then underwent radiation therapy. She is here because recent skin biopsy of the left upper chest revealed that this was a cutaneous metastases. She is here today accompanied by her family to discuss the treatment options.  REVIEW OF SYSTEMS:   Constitutional: Denies fevers, chills or abnormal weight loss Eyes: Denies blurriness of vision Ears, nose, mouth, throat, and face: Denies mucositis or sore throat Respiratory: Denies cough, dyspnea or wheezes Cardiovascular: Denies  palpitation, chest discomfort Gastrointestinal:  Denies nausea, heartburn or change in bowel habits Skin: Denies abnormal skin rashes Lymphatics: Denies new lymphadenopathy or easy bruising Neurological:Denies numbness, tingling or new weaknesses Behavioral/Psych: Mood is stable, no new  changes  Extremities: No lower extremity edema Breast:  Cutaneous nodules left chest left lower quadrant as well as the upper back All other systems were reviewed with the patient and are negative.  I have reviewed the past medical history, past surgical history, social history and family history with the patient and they are unchanged from previous note.  ALLERGIES:  is allergic to penicillins.  MEDICATIONS:  Current Outpatient Prescriptions  Medication Sig Dispense Refill  . acetaminophen-codeine (TYLENOL #3) 300-30 MG tablet Take 1 tablet by mouth every 8 (eight) hours as needed for severe pain. 60 tablet 0  . atenolol (TENORMIN) 100 MG tablet Take 1 tablet (100 mg total) by mouth daily. 30 tablet 2  . atorvastatin (LIPITOR) 80 MG tablet take 1 tablet by mouth once daily 30 tablet 3  . cholecalciferol (VITAMIN D) 1000 UNITS tablet Take 4,000 Units by mouth daily.    . clotrimazole-betamethasone (LOTRISONE) cream Apply 1 application topically 2 (two) times daily. (Patient taking differently: Apply 1 application topically daily. ) 30 g 0  . diazepam (VALIUM) 2 MG tablet take 1 tablet by mouth every 12 hours if needed 30 tablet 0  . diltiazem (CARDIZEM CD) 120 MG 24 hr capsule Take 1 capsule (120 mg total) by mouth daily. 90 capsule 3  . DULoxetine (CYMBALTA) 30 MG capsule     . ferrous sulfate 325 (65 FE) MG tablet Take 325 mg by mouth daily with breakfast.    . gabapentin (NEURONTIN) 300 MG capsule     . Gauze Pads & Dressings (TEGADERM CONTACT LAYER) 4"P80" PADS 1 application by Does not apply route every other day as needed. 1 each 3  . loperamide (IMODIUM A-D) 2 MG tablet Take 2 mg by mouth daily as needed for diarrhea or loose stools.    . montelukast (SINGULAIR) 10 MG tablet Take 1 tablet (10 mg total) by mouth daily. 30 tablet 2  . NYAMYC powder     . ondansetron (ZOFRAN) 8 MG tablet Take 1 tablet (8 mg total) by mouth every 8 (eight) hours as needed for nausea or vomiting. 20  tablet 0  . PAZEO 0.7 % SOLN Place 1 drop into both eyes daily as needed (allergies).     . potassium chloride SA (K-DUR,KLOR-CON) 20 MEQ tablet Take 1 tablet (20 mEq total) by mouth 2 (two) times daily. 60 tablet 6  . prochlorperazine (COMPAZINE) 10 MG tablet Take 1 tablet (10 mg total) by mouth every 6 (six) hours as needed (Nausea or vomiting). 30 tablet 1  . rivaroxaban (XARELTO) 20 MG TABS tablet Take 1 tablet (20 mg total) by mouth daily with supper. 90 tablet 3  . triamterene-hydrochlorothiazide (MAXZIDE-25) 37.5-25 MG tablet Take 1 tablet by mouth daily.    Marland Kitchen triamterene-hydrochlorothiazide (MAXZIDE-25) 37.5-25 MG tablet take 1 tablet by mouth once daily 30 tablet 3  . valACYclovir (VALTREX) 1000 MG tablet Take 1 tablet (1,000 mg total) by mouth 2 (two) times daily. 60 tablet 0   No current facility-administered medications for this visit.     PHYSICAL EXAMINATION: ECOG PERFORMANCE STATUS: 1 - Symptomatic but completely ambulatory  Vitals:   08/10/16 1017  BP: 128/71  Pulse: 68  Resp: 18  Temp: 98.2 F (36.8 C)   Filed Weights   08/10/16  1017  Weight: 145 lb 1.6 oz (65.8 kg)    GENERAL:alert, no distress and comfortable SKIN: profound eczema of the skin EYES: normal, Conjunctiva are pink and non-injected, sclera clear OROPHARYNX:no exudate, no erythema and lips, buccal mucosa, and tongue normal  NECK: supple, thyroid normal size, non-tender, without nodularity LYMPH:  no palpable lymphadenopathy in the cervical, axillary or inguinal LUNGS: clear to auscultation and percussion with normal breathing effort HEART: regular rate & rhythm and no murmurs and no lower extremity edema ABDOMEN:abdomen soft, non-tender and normal bowel sounds MUSCULOSKELETAL:no cyanosis of digits and no clubbing  NEURO: alert & oriented x 3 with fluent speech, no focal motor/sensory deficits EXTREMITIES: No lower extremity edema BREAST: No palpable masses or nodules in either right or left  breasts. No palpable axillary supraclavicular or infraclavicular adenopathy no breast tenderness or nipple discharge. (exam performed in the presence of a chaperone)  LABORATORY DATA:  I have reviewed the data as listed   Chemistry      Component Value Date/Time   NA 137 08/04/2016 1205   NA 137 02/26/2016 1232   K 4.3 08/04/2016 1205   K 3.6 02/26/2016 1232   CL 97 (L) 08/04/2016 1205   CO2 25 08/04/2016 1205   CO2 26 02/26/2016 1232   BUN 11 08/04/2016 1205   BUN 7.7 02/26/2016 1232   CREATININE 0.74 08/04/2016 1205   CREATININE 0.7 02/26/2016 1232      Component Value Date/Time   CALCIUM 10.0 08/04/2016 1205   CALCIUM 8.7 02/26/2016 1232   ALKPHOS 112 08/04/2016 1205   ALKPHOS 77 02/26/2016 1232   AST 21 08/04/2016 1205   AST 24 02/26/2016 1232   ALT 13 08/04/2016 1205   ALT 18 02/26/2016 1232   BILITOT 0.6 08/04/2016 1205   BILITOT <0.30 02/26/2016 1232       Lab Results  Component Value Date   WBC 4.6 08/04/2016   HGB 13.1 08/04/2016   HCT 40.0 08/04/2016   MCV 92.0 08/04/2016   PLT 285 08/04/2016   NEUTROABS 2,852 08/04/2016     ASSESSMENT & PLAN:  Breast cancer of upper-outer quadrant of right female breast ([place]) Right breast biopsy 12/10/2015: 10:00 position: 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 But they now appear to be more like metastatic deposits based on the positive biopsy. I discussed the patient that this is unfortunately represents stage IV disease. Goals of  treatment of stage IV disease are primarily palliation.  Suggested treatment options include Abraxane versus gemcitabine versus Xeloda We will also plan a whole-body PET/CT scan for restaging.  Return to clinic after PET/CT scan to finalize a treatment plan.  Orders Placed This Encounter  Procedures  . NM PET Image Restage (PS) Whole Body    Standing Status:   Future    Standing Expiration Date:   08/10/2017    Order Specific Question:   Reason for Exam (SYMPTOM  OR DIAGNOSIS REQUIRED)    Answer:   Stage 4 breast cancer cutaneous mets    Order Specific Question:   Preferred imaging location?    Answer:   [place]  [facility]    Order Specific Question:   If indicated for the ordered procedure, I authorize the administration of a radiopharmaceutical per Radiology protocol    Answer:   Yes   The patient has a good understanding of the overall plan. she agrees with it. she will call with any problems that may develop before the next visit here.   Rulon Eisenmenger, MD 08/10/16

## 2016-08-11 ENCOUNTER — Other Ambulatory Visit: Payer: Self-pay | Admitting: *Deleted

## 2016-08-11 DIAGNOSIS — C50411 Malignant neoplasm of upper-outer quadrant of right female breast: Secondary | ICD-10-CM

## 2016-08-11 DIAGNOSIS — Z853 Personal history of malignant neoplasm of breast: Secondary | ICD-10-CM

## 2016-08-13 ENCOUNTER — Encounter: Payer: Self-pay | Admitting: General Practice

## 2016-08-13 NOTE — Progress Notes (Signed)
[place] Spiritual Care Note  [place] and SW are following for emotional and other support.  Phoned Warnetta today to provide opportunity for her to process feelings/plan re mets.  Her voice sounded flat.  She is struggling to reconcile this new information and its implications, such as whether returning to work will ever be possible for her.  Per pt, some of this processing is on hold until she hears PET/CT results.   Given her f/u appt with Dr Lindi Adie on Friday 11/3, she requests [place] team call on 11/6 or after, allowing her to absorb the update beforehand.  She also notes that dtr Angie is struggling emotionally with accepting any outcome other than God's healing pt's disease.  Support Team will follow pt and family for emotional care and/or referrals for more extensive counseling as needs become clear.  Please also page if immediate needs arise.  Thank you.   [place], [place], [facility] Pager [phone] Voicemail [phone]

## 2016-08-16 ENCOUNTER — Other Ambulatory Visit: Payer: Self-pay

## 2016-08-16 DIAGNOSIS — C50919 Malignant neoplasm of unspecified site of unspecified female breast: Secondary | ICD-10-CM

## 2016-08-17 ENCOUNTER — Other Ambulatory Visit: Payer: Self-pay

## 2016-08-17 DIAGNOSIS — C50919 Malignant neoplasm of unspecified site of unspecified female breast: Secondary | ICD-10-CM

## 2016-08-17 DIAGNOSIS — IMO0001 Reserved for inherently not codable concepts without codable children: Secondary | ICD-10-CM

## 2016-08-18 ENCOUNTER — Encounter: Payer: Self-pay | Admitting: Internal Medicine

## 2016-08-19 ENCOUNTER — Other Ambulatory Visit: Payer: Self-pay | Admitting: Physician Assistant

## 2016-08-19 NOTE — Telephone Encounter (Signed)
Rx called into [facility] pharmacy.

## 2016-08-19 NOTE — Assessment & Plan Note (Deleted)
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  Suggested treatment options include Abraxane versus gemcitabine versus Xeloda PET-CT scan 08/25/16:

## 2016-08-20 ENCOUNTER — Ambulatory Visit: Payer: BC Managed Care – PPO | Admitting: Hematology and Oncology

## 2016-08-23 ENCOUNTER — Telehealth: Payer: Self-pay

## 2016-08-23 ENCOUNTER — Other Ambulatory Visit: Payer: Self-pay | Admitting: Internal Medicine

## 2016-08-23 ENCOUNTER — Encounter: Payer: Self-pay | Admitting: Internal Medicine

## 2016-08-23 MED ORDER — FLUCONAZOLE 150 MG PO TABS: 150.0000 mg | ORAL_TABLET | Freq: Once | ORAL | 0 refills | Status: AC

## 2016-08-23 NOTE — Telephone Encounter (Signed)
Received VM from pt's daughter [name] who states pt is having increased pain and has been tearful and crying since last night because of this.  Pt has new cutaneous mets and is awaiting PET scan results to be performed this Wednesday.  Pt's daughter is asking if someone can evaluate pt and provide medication for pain management.  Called pt back to discuss in detail.  Informed pt's daughter about our [facility] and she verbalized desire for her mother to be seen by NP.  Pt's daughter denied transportation to accommodate scheduling pt today; however, states she can bring pt in tomorrow at 11am.  I informed [name] we would schedule her mother for to be seen in [facility] but if she had any additional questions or concerns, to please contact our office.  [name] verbalized understanding.

## 2016-08-24 ENCOUNTER — Ambulatory Visit (HOSPITAL_BASED_OUTPATIENT_CLINIC_OR_DEPARTMENT_OTHER): Payer: BC Managed Care – PPO | Admitting: Nurse Practitioner

## 2016-08-24 VITALS — BP 117/73 | HR 79 | Temp 97.6°F | Resp 18 | Ht 62.0 in | Wt 143.3 lb

## 2016-08-24 DIAGNOSIS — C792 Secondary malignant neoplasm of skin: Secondary | ICD-10-CM

## 2016-08-24 DIAGNOSIS — G893 Neoplasm related pain (acute) (chronic): Secondary | ICD-10-CM

## 2016-08-24 DIAGNOSIS — C50411 Malignant neoplasm of upper-outer quadrant of right female breast: Secondary | ICD-10-CM | POA: Diagnosis not present

## 2016-08-24 MED ORDER — OXYCODONE HCL 5 MG PO TABS: ORAL_TABLET | ORAL | 0 refills | Status: DC

## 2016-08-24 MED ORDER — DULOXETINE HCL 60 MG PO CPEP: 60.0000 mg | ORAL_CAPSULE | Freq: Every day | ORAL | 3 refills | Status: AC

## 2016-08-24 MED ORDER — DIAZEPAM 2 MG PO TABS: 2.0000 mg | ORAL_TABLET | Freq: Three times a day (TID) | ORAL | 2 refills | Status: DC | PRN

## 2016-08-25 ENCOUNTER — Ambulatory Visit (HOSPITAL_COMMUNITY)
Admission: RE | Admit: 2016-08-25 | Discharge: 2016-08-25 | Disposition: A | Discharge status: Home / Self Care | Payer: BC Managed Care – PPO | Source: Ambulatory Visit | Attending: Hematology and Oncology | Admitting: Hematology and Oncology

## 2016-08-25 ENCOUNTER — Encounter: Payer: Self-pay | Admitting: Nurse Practitioner

## 2016-08-25 ENCOUNTER — Telehealth: Payer: Self-pay | Admitting: Hematology and Oncology

## 2016-08-25 DIAGNOSIS — I700 Atherosclerosis of aorta: Secondary | ICD-10-CM | POA: Diagnosis not present

## 2016-08-25 DIAGNOSIS — C792 Secondary malignant neoplasm of skin: Secondary | ICD-10-CM | POA: Insufficient documentation

## 2016-08-25 DIAGNOSIS — C50919 Malignant neoplasm of unspecified site of unspecified female breast: Secondary | ICD-10-CM | POA: Diagnosis not present

## 2016-08-25 DIAGNOSIS — C787 Secondary malignant neoplasm of liver and intrahepatic bile duct: Secondary | ICD-10-CM | POA: Diagnosis not present

## 2016-08-25 DIAGNOSIS — N200 Calculus of kidney: Secondary | ICD-10-CM | POA: Diagnosis not present

## 2016-08-25 DIAGNOSIS — IMO0001 Reserved for inherently not codable concepts without codable children: Secondary | ICD-10-CM

## 2016-08-25 DIAGNOSIS — G893 Neoplasm related pain (acute) (chronic): Secondary | ICD-10-CM | POA: Insufficient documentation

## 2016-08-25 LAB — GLUCOSE, CAPILLARY: Glucose-Capillary: 97 mg/dL (ref 65–99)

## 2016-08-25 MED ORDER — FLUDEOXYGLUCOSE F - 18 (FDG) INJECTION
7.0200 | Freq: Once | INTRAVENOUS | Status: AC | PRN
  Administered 2016-08-25: 7.02 via INTRAVENOUS

## 2016-08-25 NOTE — Assessment & Plan Note (Signed)
Patient has recently been diagnosed with continues metastasis to the left chest wall in the left upper back regions.  She underwent a left upper back biopsy of one of these lesions just last week and is waiting results.  She is scheduled for a restaging PET scan tomorrow, 08/25/2016.  Patient was scheduled for survivorship appointment on 10/15/2016; but will need to cancel this appointment and reschedule with Dr. Lindi Adie next week to review the PET scan results.

## 2016-08-25 NOTE — Telephone Encounter (Signed)
sw pt to confirm 11/17 appt date/time per LOS

## 2016-08-25 NOTE — Assessment & Plan Note (Signed)
Patient presented to the [facility] today with complaint of chronic, worsening pain to the newly diagnosed cutaneous metastasis to the left chest and left upper back regions.  Patient's daughter reports that patient has been crying because she has been so uncomfortable for the past several days.  She also admits that she is somewhat depressed as well.  She has been in contact with her primary care physician recently; and has had her Cymbalta increased from 30 mg per day up to 60 mg per day.  Also, patient was advised that she may increase the Valium to 3 times per day if needed.  She has not taken any pain medication other than Tylenol over-the-counter.  Patient denies any other new symptoms.  She denies any recent fevers or chills.  Exam today reveals cutaneous lesions to the left upper chest region and also to the left upper back region.  The left upper back biopsy site is slowly healing with no evidence of infection.  Long discussion with both the patient and her daughter today.  Advised that patient should continue with her increased dosing of the Cymbalta; but also advised the patient tried to decrease the Valium use as much as possible.  Advised both patient and her daughter that taking any narcotics when she is taking the pain medication.  We'll also increase the pain medication effects.  Patient was prescribed oxycodone to take 1-2 tablets every 6 hours on an as-needed basis for pain.  Also advised the patient should consider talking with some of the cancers here at the cancer center as well.  Reminded patient that the counselors were also available for her family members as well.  Patient stated that she was also anxious because she is to undergo a restaging PET scan tomorrow as well.  Patient denies any suicidal or homicidal ideation today.  She states that she has been so tearful secondary to the pain recently. Advised both patient and her daughter that she should go directly to the  emergency department for any worsening symptoms whatsoever.  Also, reviewed that the [facility] emergency department has a psychiatric unit that can obtain immediate emergency psychiatric help for the patient if needed.  Both patient and her daughter stated understanding of all instructions; and were in agreement with this plan of care.

## 2016-08-25 NOTE — Progress Notes (Signed)
SYMPTOM MANAGEMENT CLINIC    Chief Complaint: Pain  HPI:  [name] 60 y.o. female diagnosed with breast cancer; and newly diagnosed cutaneous metastasis.  Currently undergoing observation only.     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 Differential diagnosis complete replacement of lymph node versus direct extension      01/19/2016 - 01/22/2016 Hospital Admission    Hospitalization for abdominal pain, diarrhea, nausea and vomiting      02/21/2016 - 02/24/2016 Hospital Admission    Hospitalization for herpes zoster and sepsis due to cellulitis      03/05/2016 Breast MRI    Decrease in the right breast malignancy from 3.2 cm to 2.9 cm, decreasing the lymph node from 2.1 cm to 0.9 cm      03/22/2016 - 03/26/2016 Hospital Admission    Hospitalization for suspicion for wound infection from the shingles      05/13/2016 Surgery    Right lumpectomy: IDC, 3 cm, margins negative, 1/23 lymph nodes with extracapsular extension, lymphovascular invasion present, ER 0%, PR 0%, HER-2/neu negative ratio 1.74, Ki-67 60%,ypT2ypN1 (stage IIB)      06/28/2016 -  07/23/2016 Radiation Therapy    Adjuvant radiation therapy      07/30/2016 Relapse/Recurrence    Skin left scapular region: Metastatic carcinoma consistent with breast primary, CK 7 positive, p63 negative       Review of Systems  Constitutional: Positive for malaise/fatigue.  Skin:       Pain to the cutaneous metastasis to the left upper chest and the left upper back.  All other systems reviewed and are negative.   Past Medical History:  Diagnosis Date  . Anemia   . Atrial fibrillation ([place]) 2016  . Breast cancer ([place])   . Breast cancer of upper-outer quadrant of right female breast ([place]) 12/12/2015  . Complication of anesthesia 10-06-15    slow to awaken after   . Dysrhythmia    A FIB - followed by Dr. Meda Coffee  . Eczema   . Family history of breast cancer   . Family history of prostate cancer   . GERD (gastroesophageal reflux disease)   . Hypercholesteremia   . Hypertension   . Kidney stones   . MRSA (methicillin resistant staph aureus) culture positive 2009    none since per patient  . Paroxysmal atrial fibrillation ([place]) 10/2011, 06/2015  . PONV (postoperative nausea and vomiting) 10-06-15   admitted back to hospital for dehydration  . Vitamin D deficiency     Past Surgical History:  Procedure Laterality Date  . BREAST SURGERY Left  biopsy-neg  . CYSTOSCOPY WITH HOLMIUM LASER LITHOTRIPSY Right 10/06/2015   Procedure: CYSTOSCOPY WITH HOLMIUM LASER LITHOTRIPSY;  Surgeon: Kathie Rhodes, MD;  Location: [facility];  Service: Urology;  Laterality: Right;  . KIDNEY STONE SURGERY  2013  . NEPHROLITHOTOMY Right 10/06/2015   Procedure: NEPHROLITHOTOMY PERCUTANEOUS;  Surgeon: Kathie Rhodes, MD;  Location: [facility];  Service: Urology;  Laterality: Right;  . PORT-A-CATH REMOVAL Left 05/13/2016   Procedure: REMOVAL PORT-A-CATH;  Surgeon: Stark Klein, MD;  Location: [facility];  Service: General;  Laterality: Left;  . PORTACATH PLACEMENT Left 12/23/2015   Procedure:  INSERTION PORT-A-CATH WITH [place];  Surgeon: Stark Klein, MD;  Location: [facility];  Service: General;  Laterality: Left;  . RADIOACTIVE SEED GUIDED PARTIAL MASTECTOMY/AXILLARY SENTINEL NODE BIOPSY/AXILLARY NODE DISSECTION Right 05/13/2016   Procedure: RIGHT BREAST SEED LOCALIZED LUMPECTOMY WITH AXILLARY LYMPH NODE DISSECTION;  Surgeon: Stark Klein, MD;  Location: [facility];  Service: General;  Laterality: Right;    has Mixed hyperlipidemia; Kidney stones; Vitamin D deficiency; Prediabetes; Obesity; Atrial fibrillation with RVR ([place]); Essential hypertension; Breast cancer of upper-outer quadrant of right female breast ([place]); Enlarged RV (right ventricle); DOE (dyspnea on exertion); Atrial fibrillation ([place]); Port catheter in place; Herpes zoster; Family history of breast cancer; Family history of prostate cancer; Genetic testing; Protein-calorie malnutrition ([place]); Postherpetic neuralgia; Physical deconditioning; FTT (failure to thrive) in adult; Nausea and vomiting; Vaginal candidiasis; Lower abdominal pain; Infection of urinary tract; Skin ulceration ([place]); 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[place]); and Cancer associated pain on her problem list.    is allergic to penicillins.    Medication List       Accurate as of 08/24/16 11:59 PM. Always use your most recent med list.          atenolol 100 MG tablet Commonly known as:  TENORMIN Take 1 tablet (100 mg total) by mouth daily.   atorvastatin 80 MG tablet Commonly known as:  LIPITOR take 1 tablet by mouth once daily   cholecalciferol 1000 units tablet Commonly known as:  VITAMIN D Take 4,000 Units by mouth daily.   clotrimazole-betamethasone cream Commonly known as:  LOTRISONE Apply 1 application topically 2 (two) times daily.   diazepam 2 MG tablet Commonly known as:  VALIUM Take 1 tablet (2 mg total) by mouth every 8 (eight) hours as needed for anxiety (anxiety).   diltiazem 120 MG 24 hr capsule Commonly known as:  CARDIZEM  CD Take 1 capsule (120 mg total) by mouth daily.   DULoxetine 60 MG capsule Commonly known as:  CYMBALTA Take 1 capsule (60 mg total) by mouth daily.   ferrous sulfate 325 (65 FE) MG tablet Take 325 mg by mouth daily with breakfast.   loperamide 2 MG tablet Commonly known as:  IMODIUM A-D Take 2 mg by mouth daily as needed for diarrhea or loose stools.   montelukast 10 MG tablet Commonly known as:  SINGULAIR Take 1 tablet (10 mg total) by mouth daily.   NYAMYC powder Generic drug:  nystatin   ondansetron 8 MG tablet Commonly known as:  ZOFRAN Take 1 tablet (8 mg total) by mouth every 8 (eight) hours as needed for nausea or vomiting.   oxyCODONE 5 MG immediate release tablet Commonly known as:  Oxy IR/ROXICODONE Take 1-2 tabs PO Q 6 hours PRN pain.   PAZEO 0.7 % Soln Generic drug:  Olopatadine HCl Place 1 drop into both eyes daily as needed (allergies).   potassium chloride SA 20 MEQ tablet Commonly known  as:  K-DUR,KLOR-CON Take 1 tablet (20 mEq total) by mouth 2 (two) times daily.   prochlorperazine 10 MG tablet Commonly known as:  COMPAZINE Take 1 tablet (10 mg total) by mouth every 6 (six) hours as needed (Nausea or vomiting).   rivaroxaban 20 MG Tabs tablet Commonly known as:  XARELTO Take 1 tablet (20 mg total) by mouth daily with supper.   TEGADERM CONTACT LAYER 8"X10" Pads 1 application by Does not apply route every other day as needed.   triamterene-hydrochlorothiazide 37.5-25 MG tablet Commonly known as:  MAXZIDE-25 take 1 tablet by mouth once daily        PHYSICAL EXAMINATION  Oncology Vitals 08/24/2016 08/10/2016  Height 158 cm 158 cm  Weight 65 kg 65.817 kg  Weight (lbs) 143 lbs 5 oz 145 lbs 2 oz  BMI (kg/m2) 26.21 kg/m2 26.54 kg/m2  Temp 97.6 98.2  Pulse 79 68  Resp 18 18  SpO2 100 100  BSA (m2) 1.69 m2 1.7 m2   BP Readings from Last 2 Encounters:  08/24/16 117/73  08/10/16 128/71    Physical Exam  Constitutional: She is oriented  to person, place, and time. Vital signs are normal. She appears malnourished. She appears unhealthy. She appears cachectic.  HENT:  Head: Normocephalic and atraumatic.  Mouth/Throat: Oropharynx is clear and moist.  Eyes: Conjunctivae and EOM are normal. Pupils are equal, round, and reactive to light. Right eye exhibits no discharge. Left eye exhibits no discharge. No scleral icterus.  Neck: Normal range of motion. Neck supple. No JVD present. No tracheal deviation present. No thyromegaly present.  Cardiovascular: Normal rate, regular rhythm, normal heart sounds and intact distal pulses.   Pulmonary/Chest: Effort normal and breath sounds normal. No respiratory distress. She has no wheezes. She has no rales. She exhibits no tenderness.  Abdominal: Soft. Bowel sounds are normal. She exhibits no distension and no mass. There is no tenderness. There is no rebound and no guarding.  Musculoskeletal: Normal range of motion. She exhibits no edema or tenderness.  Lymphadenopathy:    She has no cervical adenopathy.  Neurological: She is alert and oriented to person, place, and time. Gait normal.  Skin: Skin is warm and dry. No rash noted. There is erythema. No pallor.  Patient has a history of severe, chronic eczema-and all of her skin appears tight and irritated.  She has chronic flaking and dryness of her skin as well.  She has obvious cutaneous metastasis nodules to the left upper chest; as well as to the left upper back region.  The left upper back region.  Biopsy site is healing well with no evidence of infection.  Psychiatric:  Patient appeared mildly anxious  Nursing note and vitals reviewed.   LABORATORY DATA:. No visits with results within 3 Day(s) from this visit.  Latest known visit with results is:  Office Visit on 08/04/2016  Component Date Value Ref Range Status  . WBC 08/04/2016 4.6  3.8 - 10.8 K/uL Final  . RBC 08/04/2016 4.35  3.80 - 5.10 MIL/uL Final  . Hemoglobin 08/04/2016 13.1   11.7 - 15.5 g/dL Final  . HCT 92/15/1582 40.0  35.0 - 45.0 % Final  . MCV 08/04/2016 92.0  80.0 - 100.0 fL Final  . MCH 08/04/2016 30.1  27.0 - 33.0 pg Final  . MCHC 08/04/2016 32.8  32.0 - 36.0 g/dL Final  . RDW 65/87/1841 17.2* 11.0 - 15.0 % Final  . Platelets 08/04/2016 285  140 - 400 K/uL Final  .  MPV 08/04/2016 9.3  7.5 - 12.5 fL Final  . Neutro Abs 08/04/2016 2852  1,500 - 7,800 cells/uL Final  . Lymphs Abs 08/04/2016 736* 850 - 3,900 cells/uL Final  . Monocytes Absolute 08/04/2016 644  200 - 950 cells/uL Final  . Eosinophils Absolute 08/04/2016 368  15 - 500 cells/uL Final  . Basophils Absolute 08/04/2016 0  0 - 200 cells/uL Final  . Neutrophils Relative % 08/04/2016 62  % Final  . Lymphocytes Relative 08/04/2016 16  % Final  . Monocytes Relative 08/04/2016 14  % Final  . Eosinophils Relative 08/04/2016 8  % Final  . Basophils Relative 08/04/2016 0  % Final  . Smear Review 08/04/2016 Criteria for review not met   Final  . Sodium 08/04/2016 137  135 - 146 mmol/L Final  . Potassium 08/04/2016 4.3  3.5 - 5.3 mmol/L Final  . Chloride 08/04/2016 97* 98 - 110 mmol/L Final  . CO2 08/04/2016 25  20 - 31 mmol/L Final  . Glucose, Bld 08/04/2016 90  65 - 99 mg/dL Final  . BUN 08/04/2016 11  7 - 25 mg/dL Final  . Creat 08/04/2016 0.74  0.50 - 0.99 mg/dL Final   Comment:   For patients > or = 61 years of age: The upper reference limit for Creatinine is approximately 13% higher for people identified as African-American.     . Calcium 08/04/2016 10.0  8.6 - 10.4 mg/dL Final  . GFR, Est African American 08/04/2016 >89  >=60 mL/min Final  . GFR, Est Non African American 08/04/2016 88  >=60 mL/min Final   Comment:   The estimated GFR is a calculation valid for adults (>=55 years old) that uses the CKD-EPI algorithm to adjust for age and sex. It is   not to be used for children, pregnant women, hospitalized patients,    patients on dialysis, or with rapidly changing kidney  function. According to the NKDEP, eGFR >89 is normal, 60-89 shows mild impairment, 30-59 shows moderate impairment, 15-29 shows severe impairment and <15 is ESRD.     Marland Kitchen Total Bilirubin 08/04/2016 0.6  0.2 - 1.2 mg/dL Final  . Bilirubin, Direct 08/04/2016 0.1  <=0.2 mg/dL Final  . Indirect Bilirubin 08/04/2016 0.5  0.2 - 1.2 mg/dL Final  . Alkaline Phosphatase 08/04/2016 112  33 - 130 U/L Final  . AST 08/04/2016 21  10 - 35 U/L Final  . ALT 08/04/2016 13  6 - 29 U/L Final  . Total Protein 08/04/2016 7.0  6.1 - 8.1 g/dL Final  . Albumin 08/04/2016 4.1  3.6 - 5.1 g/dL Final  . Magnesium 08/04/2016 1.7  1.5 - 2.5 mg/dL Final  . Cholesterol 08/04/2016 244* 125 - 200 mg/dL Final  . Triglycerides 08/04/2016 160* <150 mg/dL Final  . HDL 08/04/2016 58  >=46 mg/dL Final  . Total CHOL/HDL Ratio 08/04/2016 4.2  <=5.0 Ratio Final  . VLDL 08/04/2016 32* <30 mg/dL Final  . LDL Cholesterol 08/04/2016 154* <130 mg/dL Final   Comment:   Total Cholesterol/HDL Ratio:CHD Risk                        Coronary Heart Disease Risk Table                                        Men       Women  1/2 Average Risk              3.4        3.3              Average Risk              5.0        4.4           2X Average Risk              9.6        7.1           3X Average Risk             23.4       11.0 Use the calculated Patient Ratio above and the CHD Risk table  to determine the patient's CHD Risk.   Marland Kitchen TSH 08/04/2016 1.96  mIU/L Final   Comment:   Reference Range   > or = 20 Years  0.40-4.50   Pregnancy Range First trimester  0.26-2.66 Second trimester 0.55-2.73 Third trimester  0.43-2.91     . Hgb A1c MFr Bld 08/05/2016 5.2  <5.7 % Final   Comment:   For the purpose of screening for the presence of diabetes:   <5.7%       Consistent with the absence of diabetes 5.7-6.4 %   Consistent with increased risk for diabetes (prediabetes) >=6.5 %     Consistent with diabetes   This assay result  is consistent with a decreased risk of diabetes.   Currently, no consensus exists regarding use of hemoglobin A1c for diagnosis of diabetes in children.   According to American Diabetes Association (ADA) guidelines, hemoglobin A1c <7.0% represents optimal control in non-pregnant diabetic patients. Different metrics may apply to specific patient populations. Standards of Medical Care in Diabetes (ADA).     . Mean Plasma Glucose 08/05/2016 103  mg/dL Final  . Insulin 08/05/2016 11.0  2.0 - 19.6 uIU/mL Final   Comment: This insulin assay shows strong cross-reactivity for some insulin analogs (lispro, aspart, and glargine) and much lower cross-reactivity with others (detemir, glulisine).   . Vit D, 25-Hydroxy 08/05/2016 69  30 - 100 ng/mL Final   Comment: Vitamin D Status           25-OH Vitamin D        Deficiency                <20 ng/mL        Insufficiency         20 - 29 ng/mL        Optimal             > or = 30 ng/mL   For 25-OH Vitamin D testing on patients on D2-supplementation and patients for whom quantitation of D2 and D3 fractions is required, the QuestAssureD 25-OH VIT D, (D2,D3), LC/MS/MS is recommended: order code [id number] (patients > 2 yrs).          RADIOGRAPHIC STUDIES: No results found.  ASSESSMENT/PLAN:    Cancer associated pain Patient presented to the [facility] today with complaint of chronic, worsening pain to the newly diagnosed cutaneous metastasis to the left chest and left upper back regions.  Patient's daughter reports that patient has been crying because she has been so uncomfortable for the past several days.  She also admits that she is somewhat depressed as well.  She has been in contact with her primary care  physician recently; and has had her Cymbalta increased from 30 mg per day up to 60 mg per day.  Also, patient was advised that she may increase the Valium to 3 times per day if needed.  She has not taken any pain medication other than Tylenol  over-the-counter.  Patient denies any other new symptoms.  She denies any recent fevers or chills.  Exam today reveals cutaneous lesions to the left upper chest region and also to the left upper back region.  The left upper back biopsy site is slowly healing with no evidence of infection.  Long discussion with both the patient and her daughter today.  Advised that patient should continue with her increased dosing of the Cymbalta; but also advised the patient tried to decrease the Valium use as much as possible.  Advised both patient and her daughter that taking any narcotics when she is taking the pain medication.  We'll also increase the pain medication effects.  Patient was prescribed oxycodone to take 1-2 tablets every 6 hours on an as-needed basis for pain.  Also advised the patient should consider talking with some of the cancers here at the cancer center as well.  Reminded patient that the counselors were also available for her family members as well.  Patient stated that she was also anxious because she is to undergo a restaging PET scan tomorrow as well.  Patient denies any suicidal or homicidal ideation today.  She states that she has been so tearful secondary to the pain recently. Advised both patient and her daughter that she should go directly to the emergency department for any worsening symptoms whatsoever.  Also, reviewed that the [facility] emergency department has a psychiatric unit that can obtain immediate emergency psychiatric help for the patient if needed.  Both patient and her daughter stated understanding of all instructions; and were in agreement with this plan of care.  Breast cancer of upper-outer quadrant of right female breast [facility]) Patient has recently been diagnosed with continues metastasis to the left chest wall in the left upper back regions.  She underwent a left upper back biopsy of one of these lesions just last week and is waiting results.  She is scheduled for a  restaging PET scan tomorrow, 08/25/2016.  Patient was scheduled for survivorship appointment on 10/15/2016; but will need to cancel this appointment and reschedule with Dr. Lindi Adie next week to review the PET scan results.   Patient stated understanding of all instructions; and was in agreement with this plan of care. The patient knows to call the clinic with any problems, questions or concerns.   Total time spent with patient was 40 minutes;  with greater than 75 percent of that time spent in face to face counseling regarding patient's symptoms,  and coordination of care and follow up.  Disclaimer:This dictation was prepared with Dragon/digital dictation along with [employer]. Any transcriptional errors that result from this process are unintentional.  Drue Second, NP 08/25/2016

## 2016-08-26 ENCOUNTER — Telehealth: Payer: Self-pay | Admitting: *Deleted

## 2016-08-26 NOTE — Telephone Encounter (Signed)
Call wanting to know if FMLA papers were ready to be picked up. She asked if she could received a call back @ [phone]

## 2016-08-26 NOTE — Telephone Encounter (Signed)
Larose Kells, RN to inquire about receiving FMLA paperwork.  Tiffany reports document is complete and awaiting signature from MD.  I called pt to let her know this information.  Informed her Dr. Lindi Adie would return on Monday and we would be able to get completed documents to her when she comes for her MD appt that day.  Pt's daughter verbalized understanding and is without further questions or concerns at time of call.

## 2016-08-27 ENCOUNTER — Other Ambulatory Visit: Payer: Self-pay | Admitting: Internal Medicine

## 2016-08-29 ENCOUNTER — Other Ambulatory Visit: Payer: Self-pay | Admitting: Internal Medicine

## 2016-08-30 ENCOUNTER — Ambulatory Visit (HOSPITAL_BASED_OUTPATIENT_CLINIC_OR_DEPARTMENT_OTHER): Payer: BC Managed Care – PPO | Admitting: Hematology and Oncology

## 2016-08-30 ENCOUNTER — Encounter: Payer: Self-pay | Admitting: Hematology and Oncology

## 2016-08-30 ENCOUNTER — Encounter: Payer: Self-pay | Admitting: Internal Medicine

## 2016-08-30 DIAGNOSIS — C778 Secondary and unspecified malignant neoplasm of lymph nodes of multiple regions: Secondary | ICD-10-CM

## 2016-08-30 DIAGNOSIS — C7951 Secondary malignant neoplasm of bone: Secondary | ICD-10-CM

## 2016-08-30 DIAGNOSIS — C50411 Malignant neoplasm of upper-outer quadrant of right female breast: Secondary | ICD-10-CM

## 2016-08-30 DIAGNOSIS — C787 Secondary malignant neoplasm of liver and intrahepatic bile duct: Secondary | ICD-10-CM

## 2016-08-30 DIAGNOSIS — C7800 Secondary malignant neoplasm of unspecified lung: Secondary | ICD-10-CM

## 2016-08-30 DIAGNOSIS — C792 Secondary malignant neoplasm of skin: Secondary | ICD-10-CM

## 2016-08-30 DIAGNOSIS — Z171 Estrogen receptor negative status [ER-]: Principal | ICD-10-CM

## 2016-08-30 MED ORDER — MONTELUKAST SODIUM 10 MG PO TABS: 10.0000 mg | ORAL_TABLET | Freq: Every day | ORAL | 2 refills | Status: AC

## 2016-08-30 MED ORDER — OXYCODONE-ACETAMINOPHEN 5-325 MG PO TABS: 1.0000 | ORAL_TABLET | Freq: Three times a day (TID) | ORAL | 0 refills | Status: DC | PRN

## 2016-08-30 NOTE — Progress Notes (Signed)
Patient Care Team: Unk Pinto, MD as PCP - General (Internal Medicine) Warden Fillers, MD as Consulting Physician (Ophthalmology) Kathie Rhodes, MD as Consulting Physician (Urology) Danella Sensing, MD as Consulting Physician (Dermatology) Alfonso Patten, RN (Inactive) as Registered Nurse Dorothy Spark, MD as Consulting Physician (Cardiology) Sylvan Cheese, NP as Nurse Practitioner (Hematology and Oncology)  DIAGNOSIS:  Encounter Diagnosis  Name Primary?  . 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 Differential diagnosis complete replacement of lymph node versus direct extension      01/19/2016 - 01/22/2016 Hospital Admission    Hospitalization for abdominal pain, diarrhea, nausea and vomiting      02/21/2016 - 02/24/2016 Hospital Admission    Hospitalization for herpes zoster and sepsis due to cellulitis      03/05/2016 Breast MRI    Decrease in the  right breast malignancy from 3.2 cm to 2.9 cm, decreasing the lymph node from 2.1 cm to 0.9 cm      03/22/2016 - 03/26/2016 Hospital Admission    Hospitalization for suspicion for wound infection from the shingles      05/13/2016 Surgery    Right lumpectomy: IDC, 3 cm, margins negative, 1/23 lymph nodes with extracapsular extension, lymphovascular invasion present, ER 0%, PR 0%, HER-2/neu negative ratio 1.74, Ki-67 60%,ypT2ypN1 (stage IIB)      06/28/2016 - 07/23/2016 Radiation Therapy    Adjuvant radiation therapy      07/30/2016 Relapse/Recurrence    Skin left scapular region: Metastatic carcinoma consistent with breast primary, CK 7 positive, p63 negative      08/25/2016 PET scan    Hypermetabolic metastatic disease involving right hilar/left axillary/retrocrural lymph nodes, lungs, liver, skin and bones       CHIEF COMPLIANT: Follow-up to discuss a PET/CT scan   INTERVAL HISTORY: [name] is a 60 year old with above-mentioned history metastatic breast cancer with cutaneous metastases 1 underwent a PET/CT scan and is here accompanied by her family to discuss the report. She is reporting that these metastatic deposits are increasing in size. She was prescribed pain medication but it is causing her itching. Previously she was on Percocet which did not cause itching so she would like to go back on the Percocets. She is complaining of diffuse low back pain as well as discomfort and itching around the cutaneous deposits. Patient is having severe fatigue and generalized weakness.  REVIEW OF SYSTEMS:   Constitutional: Denies  fevers, chills or abnormal weight loss Eyes: Denies blurriness of vision Ears, nose, mouth, throat, and face: Denies mucositis or sore throat Respiratory: Denies cough, dyspnea or wheezes Cardiovascular: Denies palpitation, chest discomfort Gastrointestinal:  Denies nausea, heartburn or change in bowel habits Skin:Extensive eczematous skin reaction with left  beneath scapula cutaneous metastases, papular skin eruption Lymphatics: Denies new lymphadenopathy or easy bruising Neurological:Denies numbness, tingling or new weaknesses Behavioral/Psych: Mood is stable, no new changes  Extremities: No lower extremity edema All other systems were reviewed with the patient and are negative.  I have reviewed the past medical history, past surgical history, social history and family history with the patient and they are unchanged from previous note.  ALLERGIES:  is allergic to penicillins.  MEDICATIONS:  Current Outpatient Prescriptions  Medication Sig Dispense Refill  . atenolol (TENORMIN) 100 MG tablet take 1 tablet by mouth once daily 90 tablet 1  . atorvastatin (LIPITOR) 80 MG tablet take 1 tablet by mouth once daily 30 tablet 3  . cholecalciferol (VITAMIN D) 1000 UNITS tablet Take 4,000 Units by mouth daily.    . clotrimazole-betamethasone (LOTRISONE) cream Apply 1 application topically 2 (two) times daily. (Patient taking differently: Apply 1 application topically daily. ) 30 g 0  . diazepam (VALIUM) 2 MG tablet Take 1 tablet (2 mg total) by mouth every 8 (eight) hours as needed for anxiety (anxiety). 30 tablet 2  . diltiazem (CARDIZEM CD) 120 MG 24 hr capsule Take 1 capsule (120 mg total) by mouth daily. 90 capsule 3  . DULoxetine (CYMBALTA) 60 MG capsule Take 1 capsule (60 mg total) by mouth daily. 30 capsule 3  . ferrous sulfate 325 (65 FE) MG tablet Take 325 mg by mouth daily with breakfast.    . Gauze Pads & Dressings (TEGADERM CONTACT LAYER) 5"V76" PADS 1 application by Does not apply route every other day as needed. (Patient not taking: Reported on 08/24/2016) 1 each 3  . loperamide (IMODIUM A-D) 2 MG tablet Take 2 mg by mouth daily as needed for diarrhea or loose stools.    . montelukast (SINGULAIR) 10 MG tablet Take 1 tablet (10 mg total) by mouth daily. 30 tablet 2  . NYAMYC powder     . ondansetron (ZOFRAN) 8 MG tablet Take 1 tablet (8 mg  total) by mouth every 8 (eight) hours as needed for nausea or vomiting. (Patient not taking: Reported on 08/24/2016) 20 tablet 0  . oxyCODONE (OXY IR/ROXICODONE) 5 MG immediate release tablet Take 1-2 tabs PO Q 6 hours PRN pain. 45 tablet 0  . oxyCODONE-acetaminophen (PERCOCET/ROXICET) 5-325 MG tablet Take 1 tablet by mouth every 8 (eight) hours as needed for severe pain. 60 tablet 0  . PAZEO 0.7 % SOLN Place 1 drop into both eyes daily as needed (allergies).     . potassium chloride SA (K-DUR,KLOR-CON) 20 MEQ tablet Take 1 tablet (20 mEq total) by mouth 2 (two) times daily. 60 tablet 6  . prochlorperazine (COMPAZINE) 10 MG tablet Take 1 tablet (10 mg total) by mouth every 6 (six) hours as needed (Nausea or vomiting). (Patient not taking: Reported on 08/24/2016) 30 tablet 1  . rivaroxaban (XARELTO) 20 MG TABS tablet Take 1 tablet (20 mg total) by mouth daily with supper. 90 tablet 3  . triamterene-hydrochlorothiazide (MAXZIDE-25) 37.5-25 MG tablet take 1 tablet by mouth once daily 30 tablet 3   No current facility-administered medications for this visit.     PHYSICAL EXAMINATION: ECOG PERFORMANCE STATUS: 2 - Symptomatic, <50% confined  to bed  Vitals:   08/30/16 1556  BP: 120/76  Pulse: 88  Resp: 18  Temp: 98 F (36.7 C)   Filed Weights   08/30/16 1556  Weight: 142 lb 12.8 oz (64.8 kg)    GENERAL:alert, no distress and comfortable SKIN: Papular skin eruption throughout her back, left scapular cutaneous metastases increasing in size and causing discomfort EYES: normal, Conjunctiva are pink and non-injected, sclera clear OROPHARYNX:no exudate, no erythema and lips, buccal mucosa, and tongue normal  NECK: supple, thyroid normal size, non-tender, without nodularity LYMPH:  no palpable lymphadenopathy in the cervical, axillary or inguinal LUNGS: clear to auscultation and percussion with normal breathing effort HEART: regular rate & rhythm and no murmurs and no lower extremity  edema ABDOMEN:abdomen soft, non-tender and normal bowel sounds MUSCULOSKELETAL:no cyanosis of digits and no clubbing  NEURO: alert & oriented x 3 with fluent speech, no focal motor/sensory deficits EXTREMITIES: Generalized lower extremity weakness and low back pain   LABORATORY DATA:  I have reviewed the data as listed   Chemistry      Component Value Date/Time   NA 137 08/04/2016 1205   NA 137 02/26/2016 1232   K 4.3 08/04/2016 1205   K 3.6 02/26/2016 1232   CL 97 (L) 08/04/2016 1205   CO2 25 08/04/2016 1205   CO2 26 02/26/2016 1232   BUN 11 08/04/2016 1205   BUN 7.7 02/26/2016 1232   CREATININE 0.74 08/04/2016 1205   CREATININE 0.7 02/26/2016 1232      Component Value Date/Time   CALCIUM 10.0 08/04/2016 1205   CALCIUM 8.7 02/26/2016 1232   ALKPHOS 112 08/04/2016 1205   ALKPHOS 77 02/26/2016 1232   AST 21 08/04/2016 1205   AST 24 02/26/2016 1232   ALT 13 08/04/2016 1205   ALT 18 02/26/2016 1232   BILITOT 0.6 08/04/2016 1205   BILITOT <0.30 02/26/2016 1232       Lab Results  Component Value Date   WBC 4.6 08/04/2016   HGB 13.1 08/04/2016   HCT 40.0 08/04/2016   MCV 92.0 08/04/2016   PLT 285 08/04/2016   NEUTROABS 2,852 08/04/2016     ASSESSMENT & PLAN:  Breast cancer of upper-outer quadrant of right female breast ([place]) Right breast biopsy 12/10/2015: 10:00 position: 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 But they now appear to be more like metastatic deposits based on the  positive biopsy. I discussed the patient that this is unfortunately represents stage IV disease.  Goals of treatment of stage IV disease are primarily palliation.  PET/CT scan 38/25/0539: Hypermetabolic metastatic disease involving right hilar/left axillary/retrocrural lymph nodes, lungs, liver, skin and bones. Right infrahilar lymph node SUV 76.7, hypermetabolic lung nodules index lesion measuring 8 mm with SUV 6.3, scattered hypermetabolic skin lesions index lesion left mid ribs 7 x 12 mm SUV 5.1; 2 hypoattenuating liver lesions right hepatic lobule 12 mm SUV 11.6, lymph node diaphragmatic hiatus 9 mm SUV 11.1, numerous abnormalities throughout her bones example in the right sacrum 16.1 SUV  Radiology review: I discussed the radiology report with the patient in great detail and I  provided a copy of this for her records.  Treatment plan: 1. Pain control: I changed her pain medication to Percocets today. 2. I am very concerned that additional systemic chemotherapy could compromise her quality of life. However if the family was intent on receiving chemotherapy, I would consider treating her with IV gemcitabine. Previously she had to be hospitalized almost 4 times due to chemotherapy related toxicities. It is also not unreasonable to consider hospice care. Patient would like to go to [facility] [place] in [place]. I provided them with information on how to get in touch with them. We would be happy to send them records as soon as we get her request asking for those.  Her disease is rapidly progressing in her prognosis is poor. Patient is not ready to except hospice care at this time. She wants to try nontraditional treatments with the hope that it could prolong her life. I also recommended that they should consider getting a second opinion at the the [facility] or [facility] for any possible clinical trials. Because of her skin condition, she is not eligible to receive any immunotherapy.  No  orders of the defined types were placed in this encounter.  The patient has a good understanding of the overall plan. she agrees with it. she will call with any problems that may develop before the next visit here.   Rulon Eisenmenger, MD 08/30/16

## 2016-08-30 NOTE — Assessment & Plan Note (Signed)
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 But they now appear to be more like metastatic deposits based on the positive biopsy. I discussed the patient that this is unfortunately represents stage IV disease.  Goals of treatment of stage IV disease are primarily palliation.  PET/CT scan 67/67/2094: Hypermetabolic metastatic disease involving right hilar/left axillary/retrocrural lymph nodes, lungs, liver, skin and bones. Right infrahilar lymph node SUV 70.9, hypermetabolic lung nodules index lesion measuring 8 mm with SUV 6.3, scattered hypermetabolic skin lesions index lesion left mid ribs 7 x 12 mm SUV 5.1; 2 hypoattenuating liver lesions right hepatic lobule 12 mm SUV 11.6, lymph node diaphragmatic hiatus 9 mm SUV 11.1, numerous abnormalities throughout her bones example in the right sacrum 16.1 SUV  Radiology review: I discussed the radiology report with the patient in great detail and I provided a copy of this for her records.  Treatment plan: 1. Pain control: May consider palliative radiation to the painful cutaneous metastases 2. Offered different options of palliative chemotherapy including IV Abraxane versus IV gemcitabine versus oral Xeloda.

## 2016-08-31 ENCOUNTER — Other Ambulatory Visit: Payer: Self-pay

## 2016-08-31 ENCOUNTER — Telehealth: Payer: Self-pay

## 2016-08-31 ENCOUNTER — Encounter: Payer: Self-pay | Admitting: Internal Medicine

## 2016-08-31 MED ORDER — OXYCODONE-ACETAMINOPHEN 5-325 MG PO TABS: 1.0000 | ORAL_TABLET | Freq: Four times a day (QID) | ORAL | 0 refills | Status: DC | PRN

## 2016-08-31 NOTE — Telephone Encounter (Signed)
Called pt daughter back to address her question regarding frequency of her pain medication. Pt was prescribed percocet per Dr.Gudena for pain control 1 tab q8hrs prn. Pt daughter, Levada Dy states taht the pain medication does not last that long and was asking if it is ok to take it every 6hrs instead. Consulted with Selena Lesser, NP (symptom management) to confirm ok to change frequency to q6h instead, since Dr. Lindi Adie is out of the office today. Pt daughter verbalized understanding and will increase frequency of pain medication. Pt to call if any other concerns or questions.

## 2016-09-01 ENCOUNTER — Other Ambulatory Visit: Payer: Self-pay | Admitting: Internal Medicine

## 2016-09-01 ENCOUNTER — Encounter: Payer: Self-pay | Admitting: Internal Medicine

## 2016-09-01 ENCOUNTER — Ambulatory Visit (INDEPENDENT_AMBULATORY_CARE_PROVIDER_SITE_OTHER): Payer: BC Managed Care – PPO | Admitting: Internal Medicine

## 2016-09-01 VITALS — BP 108/64 | HR 60 | Temp 97.8°F | Resp 14 | Ht 62.0 in

## 2016-09-01 DIAGNOSIS — C50919 Malignant neoplasm of unspecified site of unspecified female breast: Secondary | ICD-10-CM

## 2016-09-01 NOTE — Progress Notes (Signed)
Subjective:    Patient ID: [name], female    DOB: Apr 06, 1956, 60 y.o.   MRN: [id number]  HPI  Patient presents to the office with her daughters today for an end of life discussion.  Patient was originally thought to be in remission from Stage 2b breast cancer, but was then found to have cutaneous metastatic disease in what was previously thought to be a shingles lesion.  Patient went back to oncology for further restaging and was told that her cancer has progressed to Stage 4 metastatic triple negative breast cancer with involvement of her bones, liver, lungs, and skin.  She has met with her oncologist Dr. Lindi Adie who recommended no further treatments be performed as she developed several chemotherapy related toxicities.  She is currently interested in seeing the [facility] in [place].  She reports that she is tired all the time and the pain medications she is taking are not helping very much.      Review of Systems  Constitutional: Positive for appetite change and fatigue. Negative for chills and fever.  Respiratory: Negative for chest tightness and shortness of breath.   Cardiovascular: Negative for chest pain and palpitations.  Gastrointestinal: Negative for abdominal pain, constipation, diarrhea, nausea and vomiting.  Musculoskeletal: Positive for arthralgias and myalgias.  Skin: Positive for rash.  Psychiatric/Behavioral: Positive for decreased concentration, dysphoric mood and sleep disturbance. The patient is nervous/anxious.        Objective:   Physical Exam  Constitutional:  Chronically ill and tired appearing  HENT:  Head: Normocephalic.  Mouth/Throat: Oropharynx is clear and moist. No oropharyngeal exudate.  Eyes: Conjunctivae are normal. No scleral icterus.  Eczema of the bilateral eye lids with some irritation and redness. Patient is actively crying in the exam room  Cardiovascular: An irregularly irregular rhythm present. Exam reveals no  gallop and no friction rub.   No murmur heard. Pulmonary/Chest: Effort normal and breath sounds normal. No respiratory distress. She has no wheezes. She has no rales. She exhibits no tenderness.  Abdominal: Soft. Bowel sounds are normal. She exhibits no distension and no mass. There is generalized tenderness. There is no rigidity, no rebound, no guarding, no tenderness at McBurney's point and negative Murphy's sign.  Musculoskeletal: Normal range of motion.  Lymphadenopathy:    She has no cervical adenopathy.  Neurological: She is alert.  Skin: Skin is warm and dry. Rash noted.  Diffuse skin rash.  Metastatic cutaneous skin nodules noted on the left breast and left back.    Psychiatric: She has a normal mood and affect. Her behavior is normal. Judgment and thought content normal.  Nursing note and vitals reviewed.         Assessment & Plan:    1. Malignant neoplasm of breast, stage 4, unspecified laterality ([place]) -had a long end of life discussion with the patient and tried to help manage expectations about the eventuality of death in the next few weeks to months.  She is considering going to the cancer treatment centers [facility] [place].  I am unsure whether she will continue down this path.  I also spoke with [name] her daughter and discussed our visit today with [name]'s approval and she is aware that this is a morbid condition that will cause her death.  Both [name] and [name] are on board with getting hospice set up.  We will start that process.  I recommend that she will likely need increase in pain medication likely to MS contin  with percocet for breathrough pain, will need vistaryl for itching, and will need potential remeron to help increase appetite.   -will speak closely with the patient to continue to offer support both medically and emotionally.   - Ambulatory referral to Hospice

## 2016-09-02 ENCOUNTER — Other Ambulatory Visit: Payer: Self-pay | Admitting: Internal Medicine

## 2016-09-02 DIAGNOSIS — C50919 Malignant neoplasm of unspecified site of unspecified female breast: Secondary | ICD-10-CM

## 2016-09-03 ENCOUNTER — Ambulatory Visit: Payer: BC Managed Care – PPO | Admitting: Hematology and Oncology

## 2016-09-03 ENCOUNTER — Other Ambulatory Visit: Payer: Self-pay | Admitting: Internal Medicine

## 2016-09-03 ENCOUNTER — Ambulatory Visit (HOSPITAL_COMMUNITY)
Admission: RE | Admit: 2016-09-03 | Discharge: 2016-09-03 | Disposition: A | Discharge status: Home / Self Care | Payer: BC Managed Care – PPO | Source: Ambulatory Visit | Attending: Internal Medicine | Admitting: Internal Medicine

## 2016-09-03 ENCOUNTER — Other Ambulatory Visit: Payer: Self-pay

## 2016-09-03 ENCOUNTER — Telehealth: Payer: Self-pay | Admitting: Hematology and Oncology

## 2016-09-03 DIAGNOSIS — C50919 Malignant neoplasm of unspecified site of unspecified female breast: Secondary | ICD-10-CM

## 2016-09-03 MED ORDER — GADOBENATE DIMEGLUMINE 529 MG/ML IV SOLN
15.0000 mL | Freq: Once | INTRAVENOUS | Status: AC | PRN
  Administered 2016-09-03: 15 mL via INTRAVENOUS

## 2016-09-03 MED ORDER — DEXAMETHASONE 1 MG PO TABS: 1.0000 mg | ORAL_TABLET | Freq: Every day | ORAL | 0 refills | Status: DC

## 2016-09-03 MED ORDER — MORPHINE SULFATE ER 15 MG PO TBCR: 15.0000 mg | EXTENDED_RELEASE_TABLET | Freq: Two times a day (BID) | ORAL | 0 refills | Status: DC | PRN

## 2016-09-03 NOTE — Progress Notes (Signed)
Pt daughter called and states that her mom's pain is not being controlled with percocet. Daughter states that pt is needing more pain medication every 2hrs instead of every 6 hrs. Pt daughter states that her skin is very itchy and flaky as well. Been giving benadryl po every 4-6hrs but its still not effective. Discussed with Dr. Lindi Adie and switching patient pain meds to ms contin 15 bid as needed and to add low dose steroid po for itching. Pt daughter states that she will need to pick it up on Monday because she is too far and we will be closed by the time she gets here. No further questions at this time.

## 2016-09-03 NOTE — Telephone Encounter (Signed)
FAXED PT RECORDS TO [facility] RELEASE ID [id number]

## 2016-09-06 ENCOUNTER — Encounter: Payer: Self-pay | Admitting: Internal Medicine

## 2016-09-06 ENCOUNTER — Telehealth: Payer: Self-pay

## 2016-09-06 NOTE — Telephone Encounter (Signed)
Hospice nurse, Helene Kelp, [phone] and wants to notify RN of pt admittance to hospice care today. Pt has MS contin that needs to be faxed to her local pharmacy. Called and spoke with pharmacist at [facility] and is aware that MS Contin will be faxed over to pharmacy today with "attn:hospice patient" on script. Told hospice nurse that they can pick it up today.

## 2016-09-07 ENCOUNTER — Other Ambulatory Visit: Payer: BC Managed Care – PPO

## 2016-09-14 ENCOUNTER — Ambulatory Visit
Admission: RE | Admit: 2016-09-14 | Payer: BC Managed Care – PPO | Source: Ambulatory Visit | Admitting: Radiation Oncology

## 2016-09-15 ENCOUNTER — Ambulatory Visit (INDEPENDENT_AMBULATORY_CARE_PROVIDER_SITE_OTHER): Payer: BC Managed Care – PPO | Admitting: Internal Medicine

## 2016-09-15 ENCOUNTER — Encounter: Payer: Self-pay | Admitting: Internal Medicine

## 2016-09-15 VITALS — BP 122/78 | HR 72 | Temp 97.8°F | Resp 16 | Ht 62.0 in | Wt 137.0 lb

## 2016-09-15 DIAGNOSIS — C50919 Malignant neoplasm of unspecified site of unspecified female breast: Secondary | ICD-10-CM

## 2016-09-15 NOTE — Progress Notes (Signed)
  60 y.o.female presents today with her daughters for evaluation of hot flashes.  She reports that she just can't understand why she is still getting hot flashes and night sweats.  She reports that her sisters who are older don't get them.  Her daughters explained to her that this was coming from her cancer.  She is currently set up with hospice.  She was unaware that there were metastatic lesions in her brain.  She is devastated and is crying and asking for Jesus to help her here in the office.  We discussed at an ooficevisit on 09/01/16 that I though that she likely had weeks to months left to live and she knows that she talked to me about it but she just didn't understand.  She feels like she has no help or family to lean on at this time.  She doesn't feel like it is fair that this is happening.  Both her daughters Nevin Bloodgood and Levada Dy don't know how to explain this to her and are asking that I speak to her a little bit about how this happened and what we can do.  I spoke with Ms. Murnane and told her that the cancer moved around in her body through her blood stream and the tumors are now growing at a really rapid rate.  I told her that right now our goal is to keep her really comfortable and also to help her take care of any unfinished business that she may have.  She reports that her biggest fear is that nobody is going to take care of her babies and grandbabies.  She is asking that I take care of them for her.  Her daughter is trying to get her a "make a wish" to go to [place] so that she can see that before she passes.  She is at peace that when she dies she won't have such a hard life anymore she just fears for her children.  She is asking that we call and keep in touch with her.  She would also like [place] to contact Dr. Ronnald Ramp' her dermatologist office to see if we can determine what she can put on her scalp to help with itching as her eczema is getting really bad.  I will send in clobetasol solution to place on the  scalp.  I have spoke with Hospice and they will have either a chaplain or social worker check on her on Thursday.  I will call to check on her on Friday.  I have encouraged both Mahek and her daughters to speak as openly as possible and try to communicate as this is a very difficult time to be going through.

## 2016-09-16 MED ORDER — CLOBETASOL PROPIONATE 0.05 % EX SOLN: 1.0000 "application " | Freq: Two times a day (BID) | CUTANEOUS | 0 refills | Status: AC

## 2016-09-17 ENCOUNTER — Encounter: Payer: Self-pay | Admitting: *Deleted

## 2016-09-17 NOTE — Progress Notes (Signed)
[facility] Work  Clinical Social Work was contacted by pt's daughter to complete legal will. CSW explained to daughter that [facility] team can only complete/notarize ADRs and HCPOA paperwork. Daughter stated understanding and that pt recently completed HCPOA paperwork through her team at [facility]. Daughter denied other needs and feels [facility] will be a great support to their family.   Clinical Social Work interventions: Education  Loren Racer, [place] Clinical Social Worker [place]  [place] Phone: [phone] Fax: [phone]

## 2016-09-21 ENCOUNTER — Other Ambulatory Visit: Payer: Self-pay | Admitting: Internal Medicine

## 2016-09-21 ENCOUNTER — Telehealth: Payer: Self-pay

## 2016-09-21 MED ORDER — DIPHENHYDRAMINE HCL 25 MG PO CAPS: 25.0000 mg | ORAL_CAPSULE | Freq: Three times a day (TID) | ORAL | 0 refills | Status: AC | PRN

## 2016-09-21 MED ORDER — MORPHINE SULFATE ER 30 MG PO TBCR: 30.0000 mg | EXTENDED_RELEASE_TABLET | Freq: Two times a day (BID) | ORAL | 0 refills | Status: AC

## 2016-09-21 NOTE — Telephone Encounter (Signed)
Nieva with [employer] called requesting information about onbody injector that fell off 01/14/16. She was asking if it was sent back, what date it was sent back. Please include reference number [id number], your name and title in your communication. Her number is [phone].

## 2016-09-21 NOTE — Progress Notes (Signed)
Hospice Nurse Junie Panning called reporting that the patient is in severe pain and asking for an increase in pain medications to MS Contin 30 mg BID.  She is also reporting the patient is having excessive sweating and is dripping wet with sweat with a fan on her.  She is having some mild abdominal pain.  She reports that she has not had urinary symptoms.  Will get a UA and culture.  Will also start benadryl 25 mg q8hrs for excessive sweating as already on gabapentin and cymbalta.  May possibly be autonomic effects of cancer vs. Pain reaction.  Will see if this helps.  Junie Panning is aware and has asked that pain medication be faxed to the [facility] on [address].  If no relief will use robinul.

## 2016-09-28 ENCOUNTER — Other Ambulatory Visit: Payer: Self-pay | Admitting: Internal Medicine

## 2016-09-28 ENCOUNTER — Encounter: Payer: Self-pay | Admitting: Internal Medicine

## 2016-09-28 ENCOUNTER — Telehealth: Payer: Self-pay

## 2016-09-28 MED ORDER — OLOPATADINE HCL 0.2 % OP SOLN: OPHTHALMIC | 0 refills | Status: AC

## 2016-09-28 NOTE — Telephone Encounter (Signed)
Nieve with [employer] is calling again to verify that onpro that fell off 01/14/16 was mailed back, what date it was sent back. Please include reference number [id number], your name and title in your communication. Her number is [phone]

## 2016-09-30 ENCOUNTER — Encounter: Payer: Self-pay | Admitting: Internal Medicine

## 2016-10-01 ENCOUNTER — Other Ambulatory Visit: Payer: Self-pay | Admitting: Internal Medicine

## 2016-10-01 MED ORDER — DEXAMETHASONE 1 MG PO TABS: 1.0000 mg | ORAL_TABLET | Freq: Every day | ORAL | 1 refills | Status: AC

## 2016-10-05 ENCOUNTER — Telehealth: Payer: Self-pay | Admitting: Pharmacist

## 2016-10-05 ENCOUNTER — Telehealth: Payer: Self-pay | Admitting: *Deleted

## 2016-10-05 NOTE — Telephone Encounter (Signed)
I received call thru triage RN [employer]) this AM from [facility]; rep nameed Nieva Ward (Ph# [phone]) requesting information on DOS 01/14/16 Neulasta OBI given and then 01/15/16 Neulasta inj given.  Pts case [id number]. I found no documentation from [facility] staff on either 3/29 or 3/30 stating the OBI fell off. I contacted [employer] and was told that on 01/14/16, Romualdo Bolk, Pharm.D. Reported OBI fell off and pt tried to reapply w/ 2 bandages.  (Neulasta OBI Lot# [id number]; Product Complaint # [id number]).  [employer] supplied [place] w/ product replacement. I contacted pts dtr Levada Dy and she recalls the OBI fell off and never activated.  They did try to reapply and did return the faulty unit to [facility] on 3/30 when they came for the Neulasta inj. I called [employer] rep back and left a voicemail with the above details. I have emailed Alinda Dooms in accounts and she has emailed [employer] "Please reverse the charges and reimbursement for TX# 138 and TX# 133".  Kennith Center, Pharm.D., CPP 10/05/2016@11 :00 AM

## 2016-10-05 NOTE — Telephone Encounter (Signed)
"  Nivea with [employer] [phone]).  We need to know if the Neulasta on-pro injector device used on January 14, 2016 was returned to the manufacturer. " This nurse called [facility] pharmacist for help with [employer] question.  [employer] called [facility] on 06-28-2016 at which time this nurse learned the device fell off.    Case reference number [place] [id number].

## 2016-10-07 ENCOUNTER — Encounter: Payer: Self-pay | Admitting: Internal Medicine

## 2016-10-07 ENCOUNTER — Other Ambulatory Visit: Payer: Self-pay | Admitting: Internal Medicine

## 2016-10-12 ENCOUNTER — Other Ambulatory Visit: Payer: Self-pay | Admitting: Internal Medicine

## 2016-10-12 DIAGNOSIS — C50411 Malignant neoplasm of upper-outer quadrant of right female breast: Secondary | ICD-10-CM

## 2016-10-12 MED ORDER — DIAZEPAM 2 MG PO TABS: 2.0000 mg | ORAL_TABLET | Freq: Three times a day (TID) | ORAL | 2 refills | Status: AC | PRN

## 2016-10-13 NOTE — Progress Notes (Signed)
Valium was called into [facility] pharmacy.

## 2016-10-15 ENCOUNTER — Encounter: Payer: BC Managed Care – PPO | Admitting: Adult Health

## 2016-10-19 ENCOUNTER — Encounter: Payer: Self-pay | Admitting: General Practice

## 2016-10-19 NOTE — Progress Notes (Signed)
[place] Spiritual Care Note  Spoke with both of [name]'s daughters, both of whom I've met, in attempts to reach pt by phone.  [name] was unable to take the call, so I am mailing her a handwritten note of encouragement this afternoon.   [place], [place], [facility] Pager [phone] Voicemail [phone]

## 2016-10-21 ENCOUNTER — Encounter: Payer: Self-pay | Admitting: Internal Medicine

## 2016-10-24 ENCOUNTER — Encounter: Payer: Self-pay | Admitting: Internal Medicine

## 2016-10-24 ENCOUNTER — Telehealth: Payer: Self-pay | Admitting: Internal Medicine

## 2016-10-24 NOTE — Telephone Encounter (Signed)
Spoke with both patient and her daughter today to offer both support and encouragement as she is declining rapidly.  She is currently resting comfortably and her daughter reports that they have been in close contact with the hospice nurses who have been coming daily this weekend.  They feel that her demise can happen any day now.  Patient was happy to speak with me.  Have told her daughter that they can feel free to call the office if they need anything.

## 2016-11-01 ENCOUNTER — Other Ambulatory Visit: Payer: Self-pay | Admitting: Internal Medicine

## 2016-11-01 ENCOUNTER — Telehealth: Payer: Self-pay | Admitting: Internal Medicine

## 2016-11-01 NOTE — Telephone Encounter (Signed)
patient has requested DNR, Vicie Mutters, RN Home Health, need verbal ok from Dr Melford Aase, [place] per Dr Melford Aase.

## 2016-11-05 ENCOUNTER — Encounter: Payer: Self-pay | Admitting: Internal Medicine

## 2016-11-08 ENCOUNTER — Ambulatory Visit: Payer: Self-pay | Admitting: Physician Assistant

## 2016-11-08 ENCOUNTER — Other Ambulatory Visit: Payer: Self-pay | Admitting: Internal Medicine

## 2016-11-18 DEATH — deceased

## 2016-11-26 ENCOUNTER — Ambulatory Visit: Payer: BC Managed Care – PPO | Admitting: Hematology and Oncology

## 2016-11-26 ENCOUNTER — Other Ambulatory Visit: Payer: BC Managed Care – PPO

## 2017-01-28 ENCOUNTER — Other Ambulatory Visit: Payer: Self-pay | Admitting: Nurse Practitioner

## 2017-02-24 IMAGING — CT CT CHEST W/ CM
2 of 3 series · 15 of 36 positions shown, 18 images · IV contrast (iopamidol)
Comparison: Portable chest dated 12/23/2015. Breast MR dated
12/17/2015.

CLINICAL DATA: Abnormal pulmonary function test. Undergoing
chemotherapy for breast cancer.

EXAM:
CT CHEST WITH CONTRAST
TECHNIQUE: Multidetector CT imaging of the chest was performed during
intravenous contrast administration.
CONTRAST:  75mL 1X8IFE-DSS IOPAMIDOL (1X8IFE-DSS) INJECTION 61%

[Series 2: thorax 5.0 i31f 1 · axial · 0.62mm/px · z∈[+1246,+1456]mm · 12 of 50 slices shown, 15 images]
[im 4/50  mediastinal]
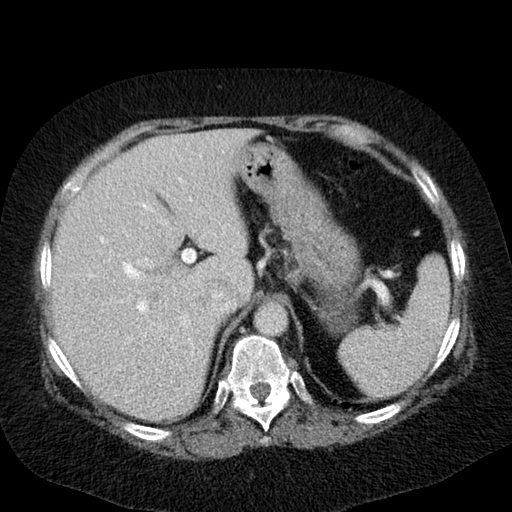
[im 4/50  lung]
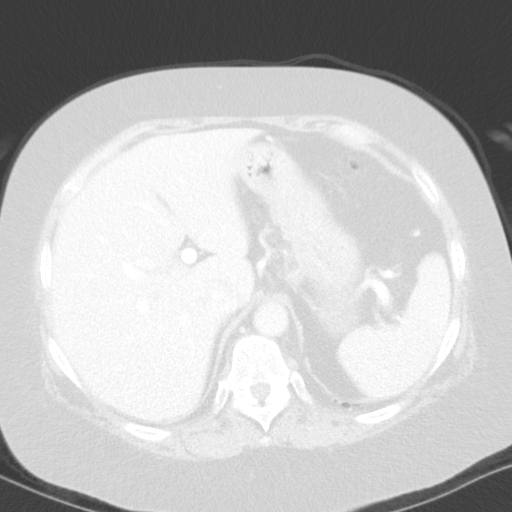
[im 8/50  lung]
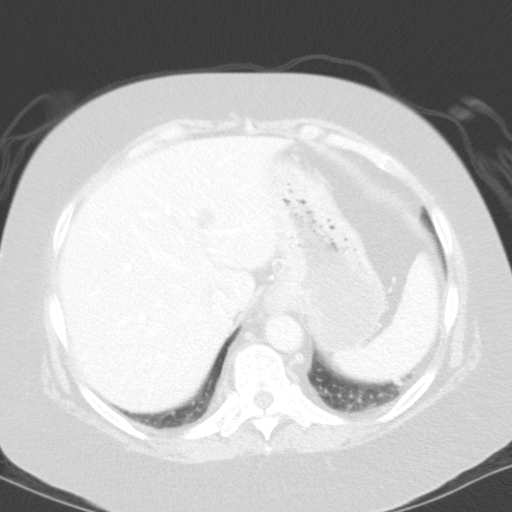
[im 11/50  lung]
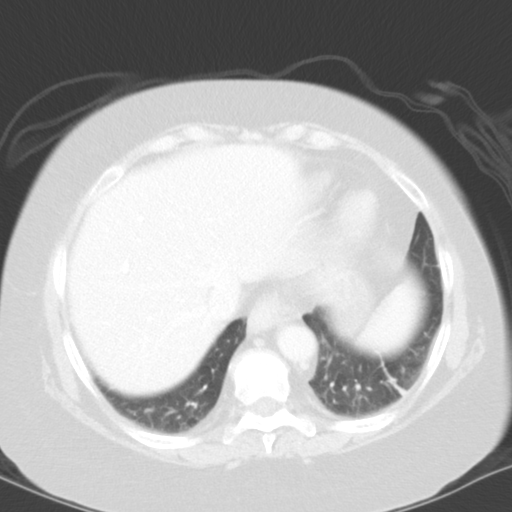
[im 15/50  lung]
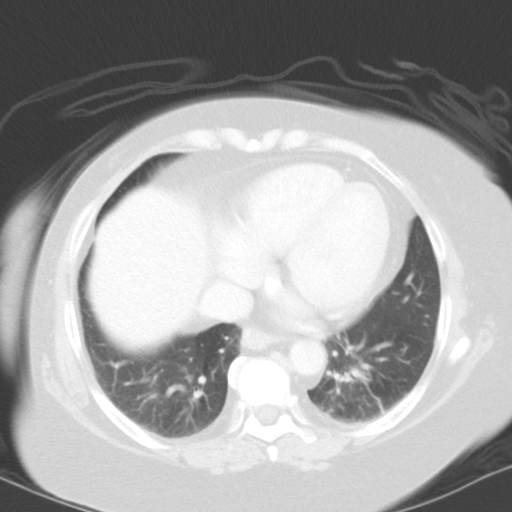
[im 19/50  mediastinal]
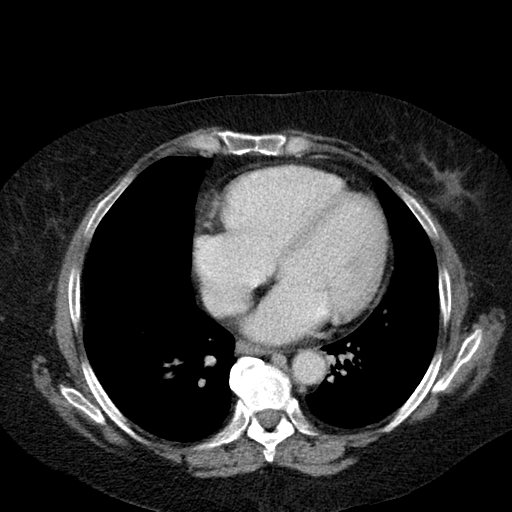
[im 19/50  lung]
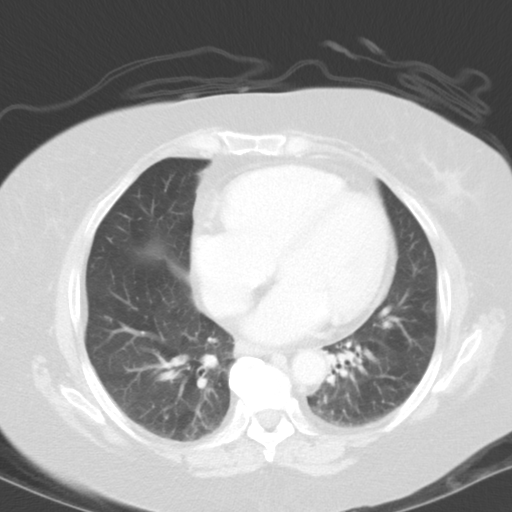
[im 22/50  lung]
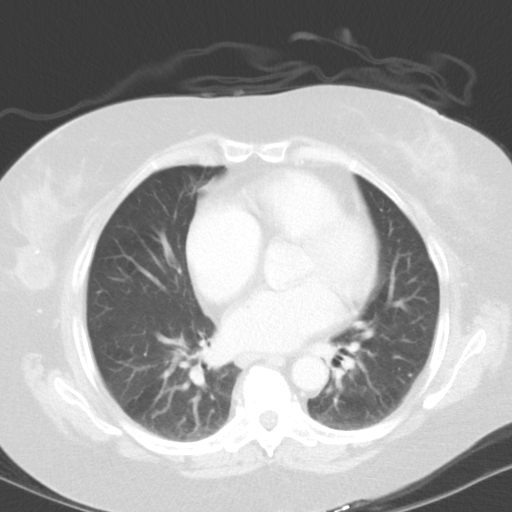
[im 28/50  lung]
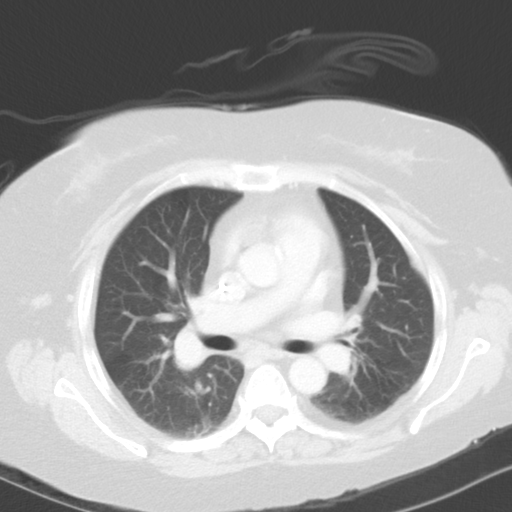
[im 31/50  lung]
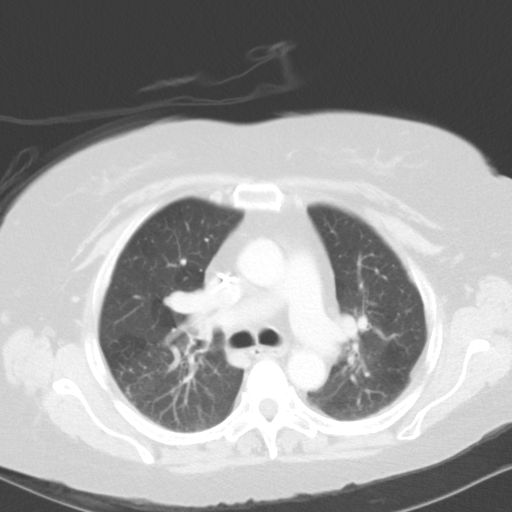
[im 35/50  mediastinal]
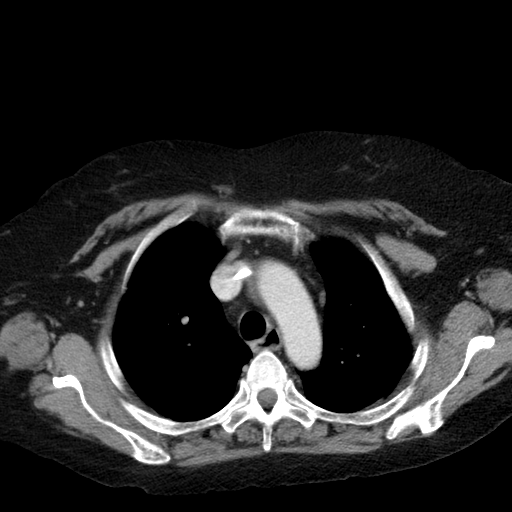
[im 35/50  lung]
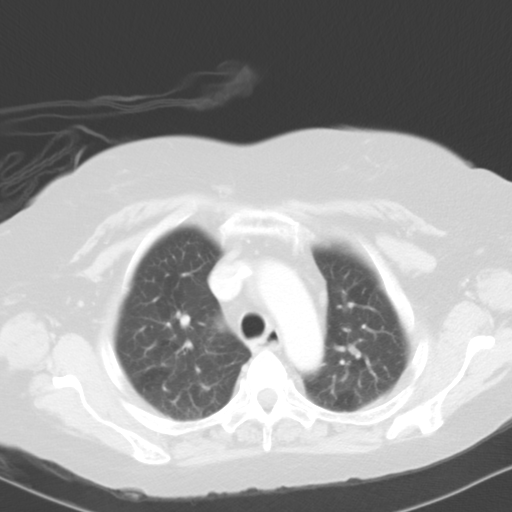
[im 39/50  lung]
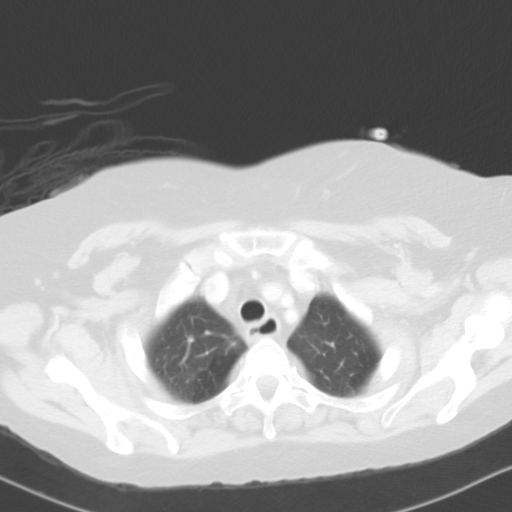
[im 42/50  lung]
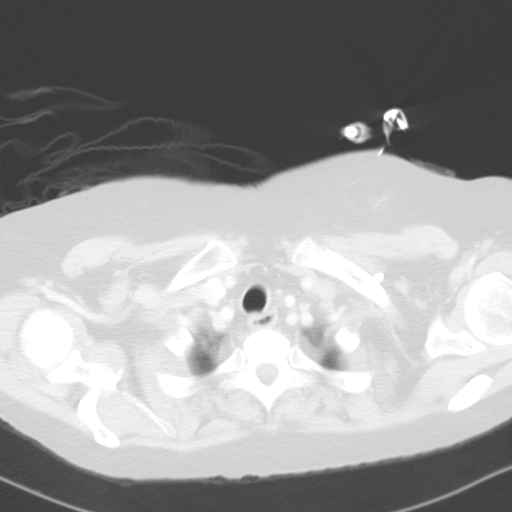
[im 46/50  lung]
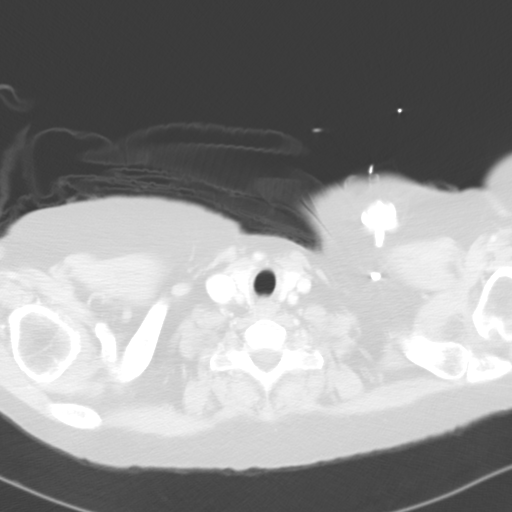

[Series 5: coronal · coronal · 0.51mm/px · 3 of 79 slices shown]
[im 16/79  lung]
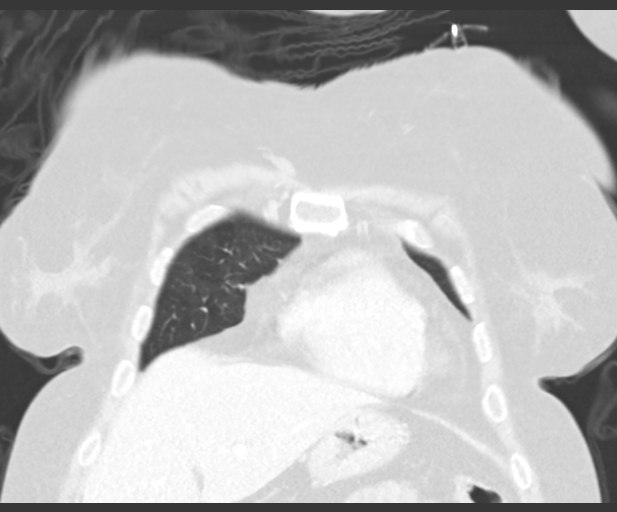
[im 32/79  lung]
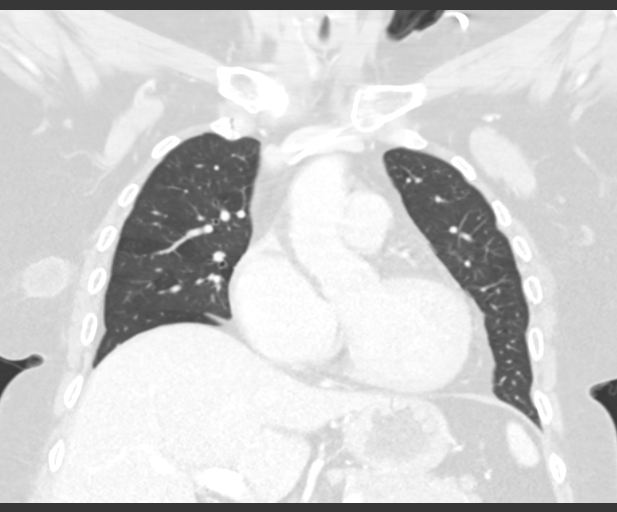
[im 47/79  lung]
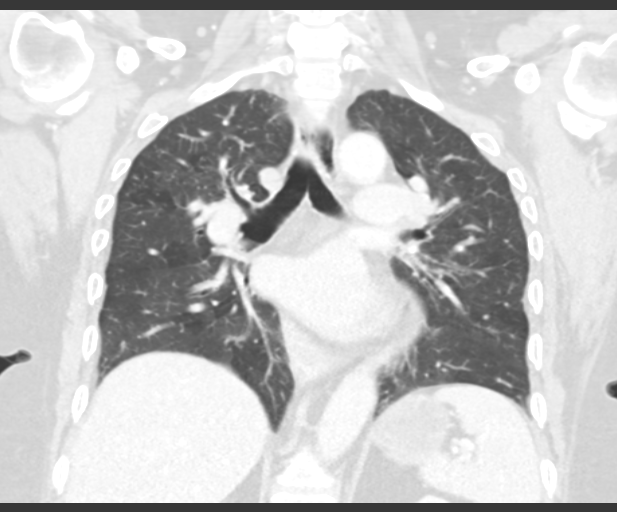

[15 of 36 positions shown; findings below may reference images not displayed]

FINDINGS: Mediastinum/Lymph Nodes: No masses, pathologically enlarged lymph
nodes, or other significant abnormality in mediastinum. The
previously demonstrated 1.9 x 1.1 cm metastatic right inferior
axillary lymph node currently measures 1.2 x 0.6 cm on image number
23. Left subclavian porta catheter tip in the superior vena cava

Lungs/Pleura: 3 mm pleural based nodule at the lateral aspect of the
right upper lobe on image number 26. Small amount of linear
atelectasis or scarring in the left lower lobe. Minimal patchy
interstitial prominence throughout both lungs.

Upper abdomen: Left lobe liver cyst.

Musculoskeletal: Lower thoracic spine degenerative changes.

Other: A right lateral, posterior breast mass is demonstrated
containing a biopsy marker clip. This measures 3.0 x 2.6 cm in
maximum diameter on image number 28. Previously noted left breast
hamartoma.
IMPRESSION: 1. Minimal interstitial lung disease.
2. 3 mm pleural-based nodule of the lateral aspect of the right
upper lobe. This is not felt to represent a metastasis. No follow-up
needed if patient is low-risk for lung carcinoma. Non-contrast chest
CT can be considered in 12 months if patient is high-risk. This
recommendation follows the consensus statement: Guidelines for
Management of Incidental Pulmonary Nodules Detected on CT
Images:From the [HOSPITAL] 1734; published online before
print (10.1148/radiol.5513111179).
3. Known malignancy in the right breast.
4. Interval decrease in size of a metastatic right axillary node
compatible with chemotherapy response.

## 2017-06-11 IMAGING — DX DG CHEST 2V
2 series · 2 of 2 positions shown · non-contrast
Comparison: 04/08/2016

CLINICAL DATA: Fever.  Cancer.

EXAM:
CHEST  2 VIEW

[chest pa]
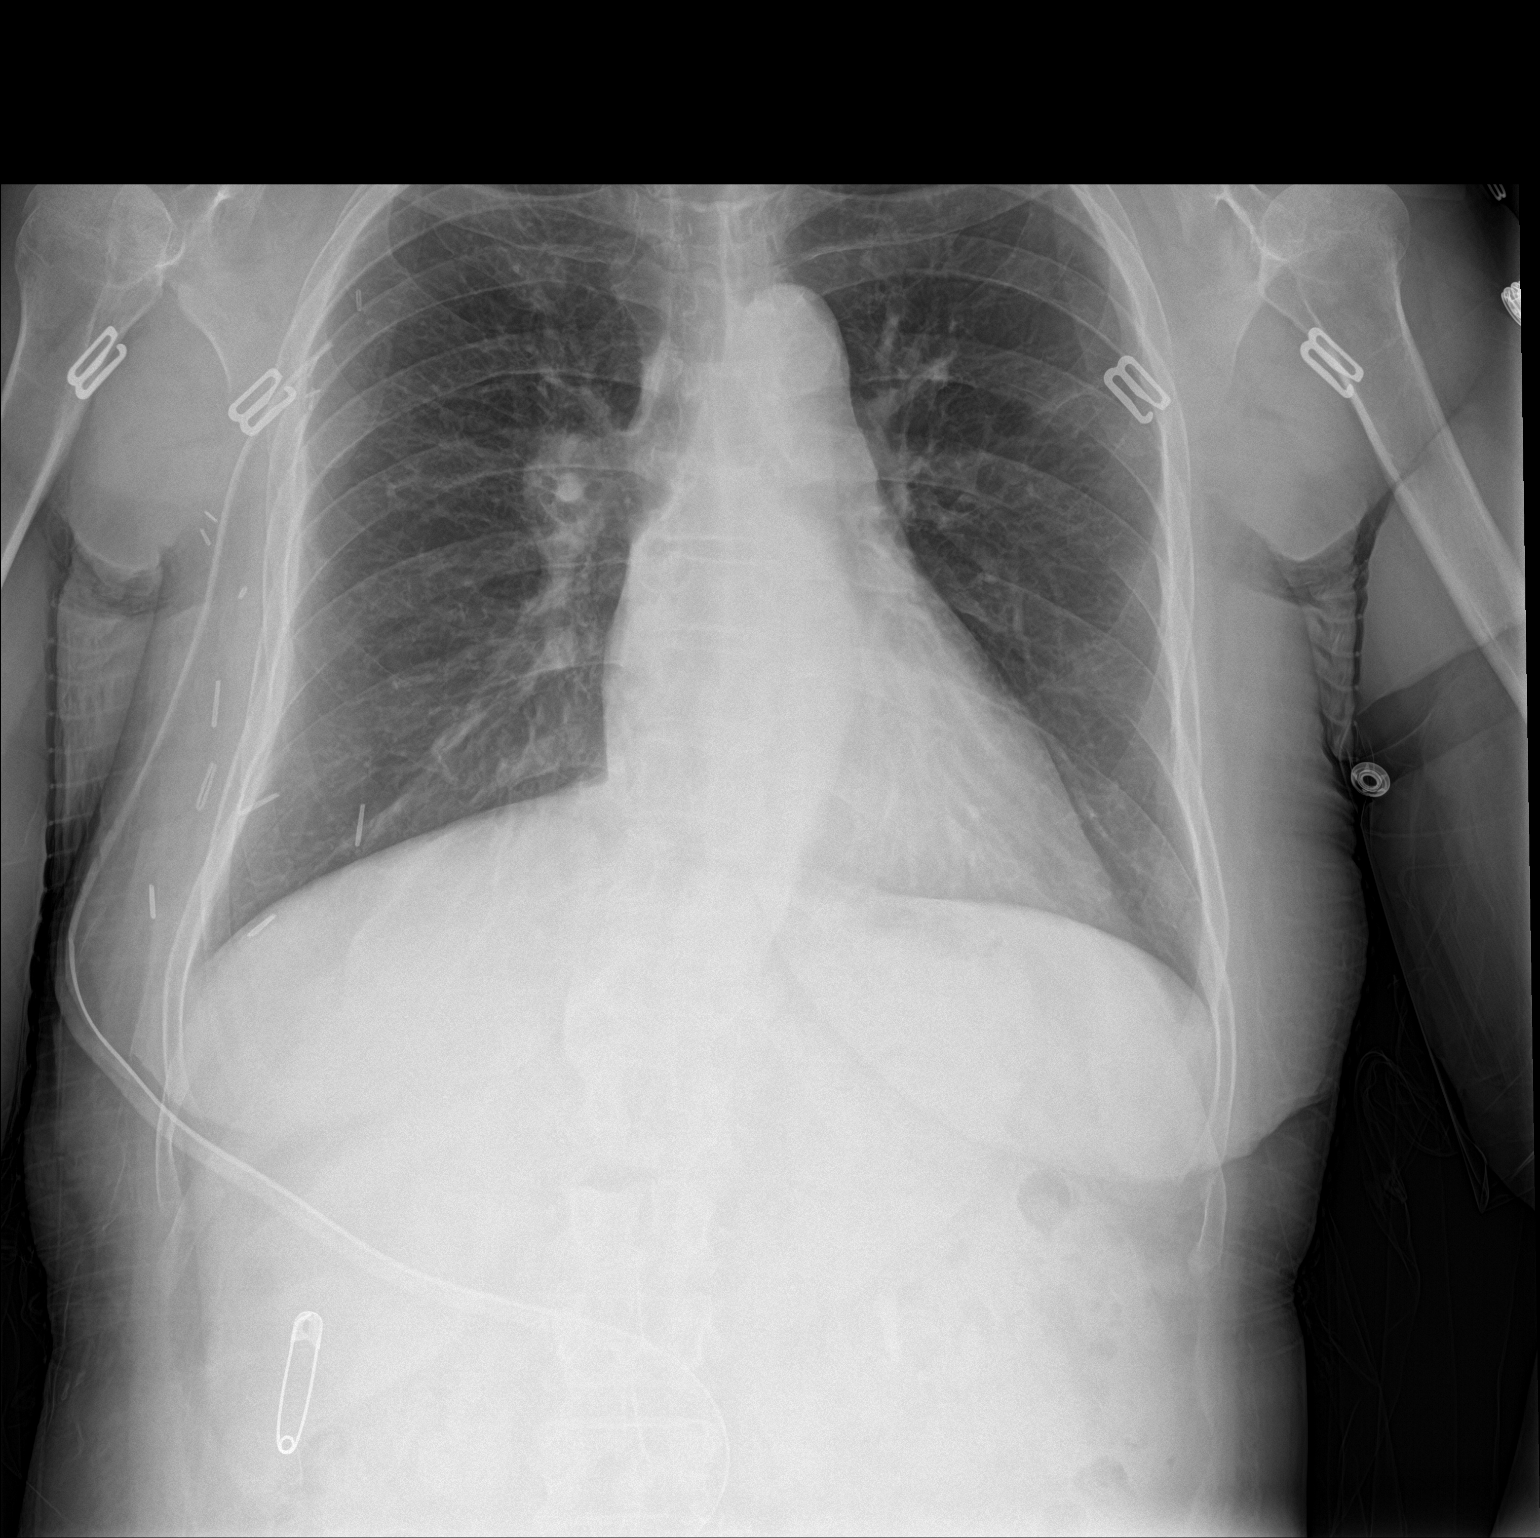

[chest lat]
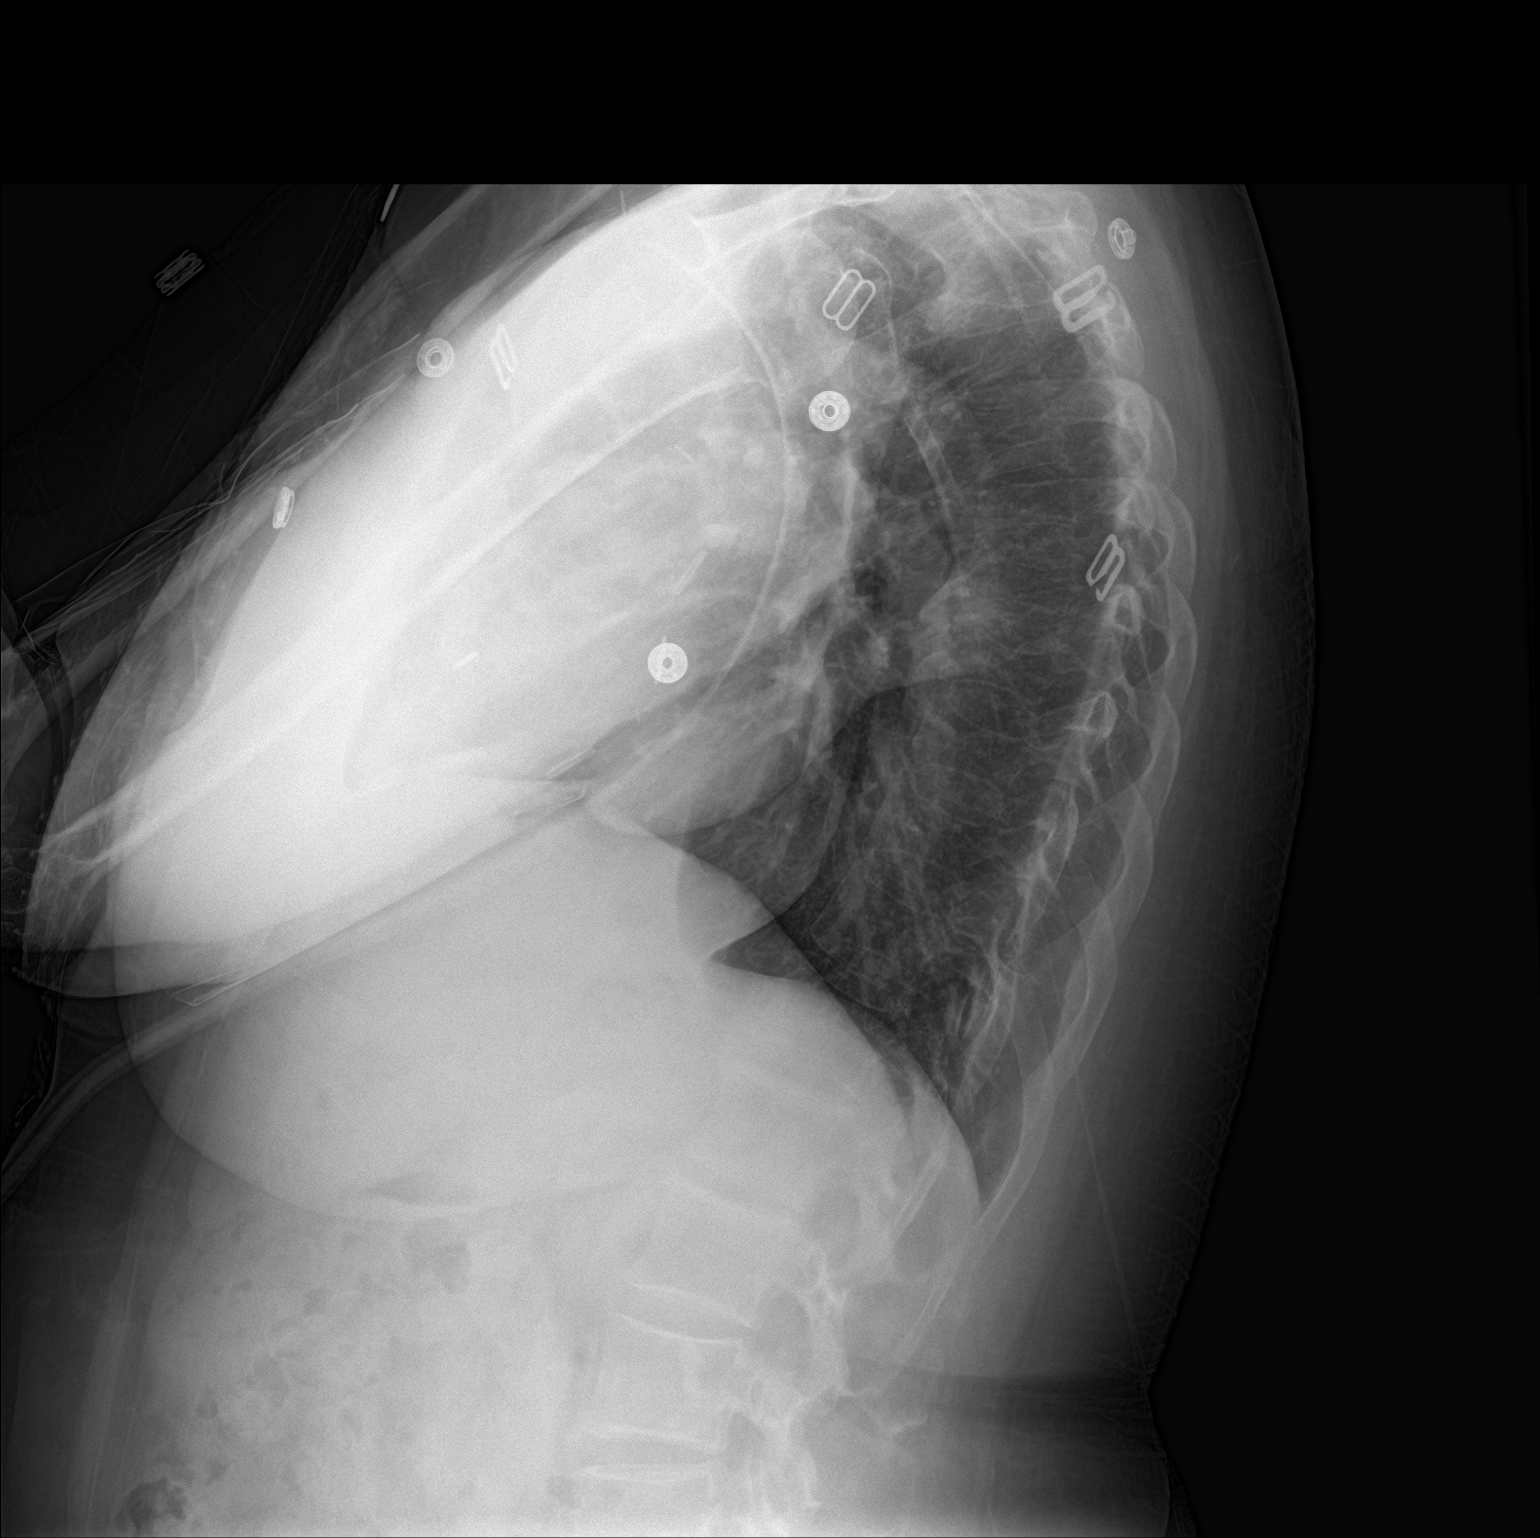

[2 of 2 positions shown; findings below may reference images not displayed]

FINDINGS: Limited lateral view anteriorly as the patient can not lift her
right arm after recent surgery. Postoperative changes to the right
chest including surgical drain. The left-sided porta catheter has
been removed since previous.

Normal heart size and mediastinal contours. No acute infiltrate or
edema. No effusion or pneumothorax. No acute osseous findings.
Subtle nodular density over the right mid chest may reflect a
subpleural nodule seen on 01/30/2016 chest CT
IMPRESSION: No evidence of pneumonia.

## 2019-06-26 ENCOUNTER — Ambulatory Visit: Payer: Self-pay

## 2020-06-25 NOTE — Progress Notes (Signed)
This encounter was created in error - please disregard.
